# Patient Record
Sex: Female | Born: 1935
Health system: Southern US, Community
[De-identification: ages and names within clinical notes are randomized; demographics above are authoritative.]

## PROBLEM LIST (undated history)

## (undated) DIAGNOSIS — I5022 Chronic systolic (congestive) heart failure: Secondary | ICD-10-CM

## (undated) DIAGNOSIS — I4719 Other supraventricular tachycardia: Secondary | ICD-10-CM

## (undated) DIAGNOSIS — I471 Supraventricular tachycardia: Secondary | ICD-10-CM

## (undated) DIAGNOSIS — I248 Other forms of acute ischemic heart disease: Secondary | ICD-10-CM

## (undated) DIAGNOSIS — R040 Epistaxis: Secondary | ICD-10-CM

## (undated) DIAGNOSIS — I2489 Other forms of acute ischemic heart disease: Secondary | ICD-10-CM

## (undated) DIAGNOSIS — H3530 Unspecified macular degeneration: Secondary | ICD-10-CM

## (undated) DIAGNOSIS — I2699 Other pulmonary embolism without acute cor pulmonale: Secondary | ICD-10-CM

## (undated) DIAGNOSIS — H269 Unspecified cataract: Secondary | ICD-10-CM

## (undated) DIAGNOSIS — S42202A Unspecified fracture of upper end of left humerus, initial encounter for closed fracture: Secondary | ICD-10-CM

## (undated) DIAGNOSIS — M810 Age-related osteoporosis without current pathological fracture: Secondary | ICD-10-CM

## (undated) HISTORY — DX: Other supraventricular tachycardia: I47.19

## (undated) HISTORY — DX: Other forms of acute ischemic heart disease: I24.89

## (undated) HISTORY — DX: Supraventricular tachycardia: I47.1

## (undated) HISTORY — DX: Epistaxis: R04.0

## (undated) HISTORY — DX: Other pulmonary embolism without acute cor pulmonale: I26.99

## (undated) HISTORY — PX: TONSILLECTOMY: SUR1361

## (undated) HISTORY — PX: CHOLECYSTECTOMY: SHX55

## (undated) HISTORY — PX: CATARACT EXTRACTION: SUR2

## (undated) HISTORY — DX: Chronic systolic (congestive) heart failure: I50.22

## (undated) HISTORY — DX: Other forms of acute ischemic heart disease: I24.8

---
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
## 2001-06-05 ENCOUNTER — Encounter: Payer: Self-pay | Admitting: Family Medicine

## 2001-06-05 ENCOUNTER — Ambulatory Visit (HOSPITAL_COMMUNITY): Admission: RE | Admit: 2001-06-05 | Discharge: 2001-06-05 | Payer: Self-pay | Admitting: Family Medicine

## 2001-11-07 ENCOUNTER — Other Ambulatory Visit: Admission: RE | Admit: 2001-11-07 | Discharge: 2001-11-07 | Payer: Self-pay | Admitting: Dermatology

## 2002-06-10 ENCOUNTER — Ambulatory Visit (HOSPITAL_COMMUNITY): Admission: RE | Admit: 2002-06-10 | Discharge: 2002-06-10 | Payer: Self-pay | Admitting: Family Medicine

## 2002-06-10 ENCOUNTER — Encounter: Payer: Self-pay | Admitting: Family Medicine

## 2003-07-06 ENCOUNTER — Ambulatory Visit (HOSPITAL_COMMUNITY): Admission: RE | Admit: 2003-07-06 | Discharge: 2003-07-06 | Payer: Self-pay | Admitting: Family Medicine

## 2003-07-06 ENCOUNTER — Encounter: Payer: Self-pay | Admitting: Family Medicine

## 2004-02-23 ENCOUNTER — Ambulatory Visit (HOSPITAL_COMMUNITY): Admission: RE | Admit: 2004-02-23 | Discharge: 2004-02-23 | Payer: Self-pay | Admitting: Family Medicine

## 2004-07-19 ENCOUNTER — Ambulatory Visit (HOSPITAL_COMMUNITY): Admission: RE | Admit: 2004-07-19 | Discharge: 2004-07-19 | Payer: Self-pay | Admitting: Family Medicine

## 2005-07-31 ENCOUNTER — Ambulatory Visit (HOSPITAL_COMMUNITY): Admission: RE | Admit: 2005-07-31 | Discharge: 2005-07-31 | Payer: Self-pay | Admitting: Family Medicine

## 2006-08-21 ENCOUNTER — Ambulatory Visit (HOSPITAL_COMMUNITY): Admission: RE | Admit: 2006-08-21 | Discharge: 2006-08-21 | Payer: Self-pay | Admitting: Family Medicine

## 2007-08-27 ENCOUNTER — Ambulatory Visit (HOSPITAL_COMMUNITY): Admission: RE | Admit: 2007-08-27 | Discharge: 2007-08-27 | Payer: Self-pay | Admitting: Family Medicine

## 2008-06-25 ENCOUNTER — Ambulatory Visit (HOSPITAL_COMMUNITY): Admission: RE | Admit: 2008-06-25 | Discharge: 2008-06-25 | Payer: Self-pay | Admitting: Family Medicine

## 2008-09-01 ENCOUNTER — Ambulatory Visit (HOSPITAL_COMMUNITY): Admission: RE | Admit: 2008-09-01 | Discharge: 2008-09-01 | Payer: Self-pay | Admitting: Family Medicine

## 2009-09-15 ENCOUNTER — Ambulatory Visit (HOSPITAL_COMMUNITY): Admission: RE | Admit: 2009-09-15 | Discharge: 2009-09-15 | Payer: Self-pay | Admitting: Family Medicine

## 2010-08-03 ENCOUNTER — Ambulatory Visit (HOSPITAL_COMMUNITY): Admission: RE | Admit: 2010-08-03 | Discharge: 2010-08-03 | Payer: Self-pay | Admitting: Family Medicine

## 2010-09-19 ENCOUNTER — Ambulatory Visit (HOSPITAL_COMMUNITY): Admission: RE | Admit: 2010-09-19 | Discharge: 2010-09-19 | Payer: Self-pay | Admitting: Family Medicine

## 2011-09-25 ENCOUNTER — Other Ambulatory Visit (HOSPITAL_COMMUNITY): Payer: Self-pay | Admitting: Family Medicine

## 2011-09-25 DIAGNOSIS — Z139 Encounter for screening, unspecified: Secondary | ICD-10-CM

## 2011-10-03 ENCOUNTER — Ambulatory Visit (HOSPITAL_COMMUNITY)
Admission: RE | Admit: 2011-10-03 | Discharge: 2011-10-03 | Disposition: A | Discharge status: Home / Self Care | Payer: Medicare Other | Source: Ambulatory Visit | Attending: Family Medicine | Admitting: Family Medicine

## 2011-10-03 DIAGNOSIS — Z1231 Encounter for screening mammogram for malignant neoplasm of breast: Secondary | ICD-10-CM | POA: Insufficient documentation

## 2011-10-03 DIAGNOSIS — Z139 Encounter for screening, unspecified: Secondary | ICD-10-CM

## 2012-04-22 ENCOUNTER — Other Ambulatory Visit (HOSPITAL_COMMUNITY): Payer: Self-pay | Admitting: Family Medicine

## 2012-04-22 ENCOUNTER — Ambulatory Visit (HOSPITAL_COMMUNITY)
Admission: RE | Admit: 2012-04-22 | Discharge: 2012-04-22 | Disposition: A | Discharge status: Home / Self Care | Payer: Medicare Other | Source: Ambulatory Visit | Attending: Family Medicine | Admitting: Family Medicine

## 2012-04-22 DIAGNOSIS — M25569 Pain in unspecified knee: Secondary | ICD-10-CM | POA: Diagnosis not present

## 2012-04-22 DIAGNOSIS — M159 Polyosteoarthritis, unspecified: Secondary | ICD-10-CM | POA: Diagnosis not present

## 2012-04-22 DIAGNOSIS — Z6825 Body mass index (BMI) 25.0-25.9, adult: Secondary | ICD-10-CM | POA: Diagnosis not present

## 2012-04-22 DIAGNOSIS — Z139 Encounter for screening, unspecified: Secondary | ICD-10-CM

## 2012-04-25 DIAGNOSIS — M25569 Pain in unspecified knee: Secondary | ICD-10-CM | POA: Diagnosis not present

## 2012-04-25 DIAGNOSIS — M109 Gout, unspecified: Secondary | ICD-10-CM | POA: Diagnosis not present

## 2012-05-01 ENCOUNTER — Other Ambulatory Visit (HOSPITAL_COMMUNITY): Payer: Medicare Other

## 2012-05-16 ENCOUNTER — Other Ambulatory Visit (HOSPITAL_COMMUNITY): Payer: Medicare Other

## 2012-05-16 ENCOUNTER — Ambulatory Visit (HOSPITAL_COMMUNITY)
Admission: RE | Admit: 2012-05-16 | Discharge: 2012-05-16 | Disposition: A | Discharge status: Home / Self Care | Payer: Medicare Other | Source: Ambulatory Visit | Attending: Family Medicine | Admitting: Family Medicine

## 2012-05-16 DIAGNOSIS — Z13820 Encounter for screening for osteoporosis: Secondary | ICD-10-CM | POA: Diagnosis not present

## 2012-05-16 DIAGNOSIS — M818 Other osteoporosis without current pathological fracture: Secondary | ICD-10-CM | POA: Insufficient documentation

## 2012-05-16 DIAGNOSIS — M810 Age-related osteoporosis without current pathological fracture: Secondary | ICD-10-CM | POA: Diagnosis not present

## 2012-05-16 DIAGNOSIS — Z139 Encounter for screening, unspecified: Secondary | ICD-10-CM

## 2012-06-03 DIAGNOSIS — E559 Vitamin D deficiency, unspecified: Secondary | ICD-10-CM | POA: Diagnosis not present

## 2012-06-03 DIAGNOSIS — M810 Age-related osteoporosis without current pathological fracture: Secondary | ICD-10-CM | POA: Diagnosis not present

## 2012-06-03 DIAGNOSIS — E785 Hyperlipidemia, unspecified: Secondary | ICD-10-CM | POA: Diagnosis not present

## 2012-06-04 DIAGNOSIS — E785 Hyperlipidemia, unspecified: Secondary | ICD-10-CM | POA: Diagnosis not present

## 2012-06-04 DIAGNOSIS — E559 Vitamin D deficiency, unspecified: Secondary | ICD-10-CM | POA: Diagnosis not present

## 2012-06-04 DIAGNOSIS — M810 Age-related osteoporosis without current pathological fracture: Secondary | ICD-10-CM | POA: Diagnosis not present

## 2012-08-27 DIAGNOSIS — H04129 Dry eye syndrome of unspecified lacrimal gland: Secondary | ICD-10-CM | POA: Diagnosis not present

## 2012-08-28 ENCOUNTER — Other Ambulatory Visit (HOSPITAL_COMMUNITY): Payer: Self-pay | Admitting: Family Medicine

## 2012-08-28 DIAGNOSIS — Z139 Encounter for screening, unspecified: Secondary | ICD-10-CM

## 2012-10-03 ENCOUNTER — Ambulatory Visit (HOSPITAL_COMMUNITY): Payer: 59

## 2012-10-07 ENCOUNTER — Ambulatory Visit (HOSPITAL_COMMUNITY): Payer: 59

## 2012-10-08 ENCOUNTER — Ambulatory Visit (HOSPITAL_COMMUNITY)
Admission: RE | Admit: 2012-10-08 | Discharge: 2012-10-08 | Disposition: A | Discharge status: Home / Self Care | Payer: Medicare Other | Source: Ambulatory Visit | Attending: Family Medicine | Admitting: Family Medicine

## 2012-10-08 DIAGNOSIS — Z139 Encounter for screening, unspecified: Secondary | ICD-10-CM

## 2012-10-08 DIAGNOSIS — Z1231 Encounter for screening mammogram for malignant neoplasm of breast: Secondary | ICD-10-CM | POA: Insufficient documentation

## 2012-11-26 DIAGNOSIS — H353190 Nonexudative age-related macular degeneration, unspecified eye, stage unspecified: Secondary | ICD-10-CM | POA: Diagnosis not present

## 2012-11-26 DIAGNOSIS — Z961 Presence of intraocular lens: Secondary | ICD-10-CM | POA: Diagnosis not present

## 2013-03-12 DIAGNOSIS — H6120 Impacted cerumen, unspecified ear: Secondary | ICD-10-CM | POA: Diagnosis not present

## 2013-03-17 ENCOUNTER — Other Ambulatory Visit (HOSPITAL_COMMUNITY): Payer: Self-pay | Admitting: Family Medicine

## 2013-03-17 DIAGNOSIS — R9389 Abnormal findings on diagnostic imaging of other specified body structures: Secondary | ICD-10-CM

## 2013-03-20 ENCOUNTER — Ambulatory Visit (HOSPITAL_COMMUNITY)
Admission: RE | Admit: 2013-03-20 | Discharge: 2013-03-20 | Disposition: A | Discharge status: Home / Self Care | Payer: Medicare Other | Source: Ambulatory Visit | Attending: Family Medicine | Admitting: Family Medicine

## 2013-03-20 DIAGNOSIS — R9389 Abnormal findings on diagnostic imaging of other specified body structures: Secondary | ICD-10-CM | POA: Diagnosis not present

## 2013-03-20 DIAGNOSIS — I6529 Occlusion and stenosis of unspecified carotid artery: Secondary | ICD-10-CM | POA: Diagnosis not present

## 2013-03-20 DIAGNOSIS — Z136 Encounter for screening for cardiovascular disorders: Secondary | ICD-10-CM | POA: Diagnosis not present

## 2013-07-01 ENCOUNTER — Emergency Department (HOSPITAL_COMMUNITY)
Admission: EM | Admit: 2013-07-01 | Discharge: 2013-07-01 | Disposition: A | Discharge status: Home / Self Care | Payer: Medicare Other | Attending: Emergency Medicine | Admitting: Emergency Medicine

## 2013-07-01 ENCOUNTER — Encounter (HOSPITAL_COMMUNITY): Payer: Self-pay | Admitting: *Deleted

## 2013-07-01 DIAGNOSIS — Z87891 Personal history of nicotine dependence: Secondary | ICD-10-CM | POA: Diagnosis not present

## 2013-07-01 DIAGNOSIS — Z8739 Personal history of other diseases of the musculoskeletal system and connective tissue: Secondary | ICD-10-CM | POA: Diagnosis not present

## 2013-07-01 DIAGNOSIS — Z8669 Personal history of other diseases of the nervous system and sense organs: Secondary | ICD-10-CM | POA: Insufficient documentation

## 2013-07-01 DIAGNOSIS — R040 Epistaxis: Secondary | ICD-10-CM

## 2013-07-01 DIAGNOSIS — H6120 Impacted cerumen, unspecified ear: Secondary | ICD-10-CM | POA: Diagnosis not present

## 2013-07-01 DIAGNOSIS — H6121 Impacted cerumen, right ear: Secondary | ICD-10-CM

## 2013-07-01 HISTORY — DX: Age-related osteoporosis without current pathological fracture: M81.0

## 2013-07-01 HISTORY — DX: Unspecified cataract: H26.9

## 2013-07-01 MED ORDER — OXYMETAZOLINE HCL 0.05 % NA SOLN
1.0000 | Freq: Once | NASAL | Status: AC
  Administered 2013-07-01: 1 via NASAL
  Filled 2013-07-01: qty 15

## 2013-07-01 NOTE — ED Notes (Signed)
Patient's ears irrigated with gross amounts of wax removed. Patient's ear canals clean and eardrums visible. Patient verbalizes that she can now hear out of her right ear.

## 2013-07-01 NOTE — ED Notes (Signed)
Nosebleed began this a.m. At 0500.  Has been taking daily ASA x 1 year (fairly regularly) and has experienced nosebleeds in right nare almost every other day for 2 weeks.  Usually last 5 minutes and stop w/pressure applied.  Could not get it to stop today.

## 2013-07-01 NOTE — ED Provider Notes (Signed)
History    CSN: [id number] Arrival date & time 07/01/13  4098  First MD Initiated Contact with Patient 07/01/13 [phone]     Chief Complaint  Patient presents with  . Epistaxis   (Consider location/radiation/quality/duration/timing/severity/associated sxs/prior Treatment) Patient is a 79 y.o. female presenting with nosebleeds. The history is provided by the patient and a relative.  Epistaxis Location:  R nare Severity:  Moderate Duration:  3 days Timing:  Intermittent Progression:  Worsening (She woke this morning around 5 to go to the bathroom,  and redeveloped a nose bleed which has not resolved as they usually do by applying presure.) Chronicity:  Recurrent Context: aspirin use   Context: not bleeding disorder, not foreign body, not home oxygen, not recent infection and not trauma   Context comment:  She takes a daily asa 81 mg. Relieved by:  Nothing Worsened by:  Nothing tried Associated symptoms: blood in oropharynx   Associated symptoms: no congestion, no cough, no dizziness, no fever, no headaches, no sneezing and no sore throat   Risk factors: no recent nasal surgery and no sinus problems   Risk factors comment:  She reports a history of nosebleeds,  occurring every few years for several days,  then resolves.  Past Medical History  Diagnosis Date  . Osteoporosis   . Cataracts, bilateral    Past Surgical History  Procedure Laterality Date  . Cataract extraction      bilaterally   History reviewed. No pertinent family history. History  Substance Use Topics  . Smoking status: Former Smoker    Types: Cigarettes  . Smokeless tobacco: Not on file  . Alcohol Use: No   OB History   Grav Para Term Preterm Abortions TAB SAB Ect Mult Living   0              Review of Systems  Constitutional: Negative for fever.  HENT: Positive for nosebleeds. Negative for congestion, sore throat, rhinorrhea, sneezing, neck pain and sinus pressure.   Eyes: Negative.   Respiratory:  Negative for cough, chest tightness and shortness of breath.   Cardiovascular: Negative for chest pain.  Gastrointestinal: Negative for nausea and abdominal pain.  Genitourinary: Negative.   Musculoskeletal: Negative for joint swelling and arthralgias.  Skin: Negative.  Negative for rash and wound.  Neurological: Negative for dizziness, weakness, light-headedness, numbness and headaches.  Psychiatric/Behavioral: Negative.     Allergies  Review of patient's allergies indicates no known allergies.  Home Medications  No current outpatient prescriptions on file. BP 121/85  Pulse 78  Temp(Src) 97.7 F (36.5 C) (Oral)  Resp 16  Ht 5\' 4"  (1.626 m)  Wt 140 lb (63.504 kg)  BMI 24.02 kg/m2  SpO2 94% Physical Exam  Nursing note and vitals reviewed. Constitutional: She is oriented to person, place, and time. She appears well-developed and well-nourished.  HENT:  Head: Normocephalic and atraumatic.  Nose: Epistaxis is observed.  Right cerumen impaction.  Unable to visualize source of bleeding in right nare.   Eyes: Conjunctivae are normal.  Neck: Normal range of motion.  Cardiovascular: Normal rate and regular rhythm.   Pulmonary/Chest: Effort normal.  Musculoskeletal: Normal range of motion.  Neurological: She is alert and oriented to person, place, and time.  Skin: Skin is warm and dry.  Psychiatric: She has a normal mood and affect.    ED Course  Procedures (including critical care time)   Pt was given 1 spray of Afrin,  Held pressure for 10 minutes with continued small  amount of blood present,  Still unable to visualize source.  Reapplied 2 sprays of afrin,  Held pressure again for 10 minutes with apparent resolution of bleeding.  No blood visualized in nostril,  No posterior pharygeal bleeding.  Will continue to monitor for signs of re-bleeding.  During visit,  Pt expressed concerns re right decreased hearing acuity and ear pressure since "this spring".  Has been applying a otc  cerumen softener product this week.  Requested evaluation for this.  Cerumen impaction present,  Will flush this ear as we continue to monitor for signs of residual epistaxis.   9:51 AM   Recheck of nostril,  No bleeding,  No posterior pharyngeal blood.  RN flushed right ear with complete removal of cerumen impaction.  Smaller amount of wax on left with no impaction,  This was flushed as well. Labs Reviewed - No data to display No results found. 1. Epistaxis, recurrent   2. Cerumen impaction, right     MDM  Discussed nasal packing with patient who is reluctant to have this placed.  Epistaxis appears resolved at present.  Advised to avoid blowing nose for the next 2 days.  She will take the afrin home, instructed in use only if bleed returns, followed by pressure.  Advised that if bleeding recurs and she needs to return,  Will probably need packing. She understands.    Burgess Amor, PA-C 07/01/13 [phone]

## 2013-07-04 NOTE — ED Provider Notes (Signed)
Medical screening examination/treatment/procedure(s) were performed by non-physician practitioner and as supervising physician I was immediately available for consultation/collaboration.   Laray Anger, DO 07/04/13 1358

## 2013-09-29 ENCOUNTER — Other Ambulatory Visit (HOSPITAL_COMMUNITY): Payer: Self-pay | Admitting: Physician Assistant

## 2013-09-29 DIAGNOSIS — Z139 Encounter for screening, unspecified: Secondary | ICD-10-CM

## 2013-09-30 DIAGNOSIS — Z23 Encounter for immunization: Secondary | ICD-10-CM | POA: Diagnosis not present

## 2013-10-06 DIAGNOSIS — M79609 Pain in unspecified limb: Secondary | ICD-10-CM | POA: Diagnosis not present

## 2013-10-06 DIAGNOSIS — M722 Plantar fascial fibromatosis: Secondary | ICD-10-CM | POA: Diagnosis not present

## 2013-10-14 ENCOUNTER — Ambulatory Visit (HOSPITAL_COMMUNITY)
Admission: RE | Admit: 2013-10-14 | Discharge: 2013-10-14 | Disposition: A | Discharge status: Home / Self Care | Payer: Medicare Other | Source: Ambulatory Visit | Attending: Physician Assistant | Admitting: Physician Assistant

## 2013-10-14 DIAGNOSIS — Z1231 Encounter for screening mammogram for malignant neoplasm of breast: Secondary | ICD-10-CM | POA: Insufficient documentation

## 2013-10-14 DIAGNOSIS — Z139 Encounter for screening, unspecified: Secondary | ICD-10-CM

## 2013-10-27 DIAGNOSIS — M79609 Pain in unspecified limb: Secondary | ICD-10-CM | POA: Diagnosis not present

## 2013-10-27 DIAGNOSIS — M722 Plantar fascial fibromatosis: Secondary | ICD-10-CM | POA: Diagnosis not present

## 2013-11-17 DIAGNOSIS — M722 Plantar fascial fibromatosis: Secondary | ICD-10-CM | POA: Diagnosis not present

## 2013-11-17 DIAGNOSIS — M79609 Pain in unspecified limb: Secondary | ICD-10-CM | POA: Diagnosis not present

## 2013-12-08 DIAGNOSIS — M79609 Pain in unspecified limb: Secondary | ICD-10-CM | POA: Diagnosis not present

## 2013-12-08 DIAGNOSIS — M722 Plantar fascial fibromatosis: Secondary | ICD-10-CM | POA: Diagnosis not present

## 2013-12-30 DIAGNOSIS — H353190 Nonexudative age-related macular degeneration, unspecified eye, stage unspecified: Secondary | ICD-10-CM | POA: Diagnosis not present

## 2013-12-30 DIAGNOSIS — Z961 Presence of intraocular lens: Secondary | ICD-10-CM | POA: Diagnosis not present

## 2014-03-20 DIAGNOSIS — Z6825 Body mass index (BMI) 25.0-25.9, adult: Secondary | ICD-10-CM | POA: Diagnosis not present

## 2014-03-20 DIAGNOSIS — E785 Hyperlipidemia, unspecified: Secondary | ICD-10-CM | POA: Diagnosis not present

## 2014-03-20 DIAGNOSIS — J019 Acute sinusitis, unspecified: Secondary | ICD-10-CM | POA: Diagnosis not present

## 2014-03-20 DIAGNOSIS — Z0000 Encounter for general adult medical examination without abnormal findings: Secondary | ICD-10-CM | POA: Diagnosis not present

## 2014-03-20 DIAGNOSIS — M810 Age-related osteoporosis without current pathological fracture: Secondary | ICD-10-CM | POA: Diagnosis not present

## 2014-03-20 DIAGNOSIS — J310 Chronic rhinitis: Secondary | ICD-10-CM | POA: Diagnosis not present

## 2014-06-30 DIAGNOSIS — N183 Chronic kidney disease, stage 3 unspecified: Secondary | ICD-10-CM | POA: Diagnosis not present

## 2014-06-30 DIAGNOSIS — E568 Deficiency of other vitamins: Secondary | ICD-10-CM | POA: Diagnosis not present

## 2014-06-30 DIAGNOSIS — Z6825 Body mass index (BMI) 25.0-25.9, adult: Secondary | ICD-10-CM | POA: Diagnosis not present

## 2014-07-01 DIAGNOSIS — E559 Vitamin D deficiency, unspecified: Secondary | ICD-10-CM | POA: Diagnosis not present

## 2014-07-14 DIAGNOSIS — Z6825 Body mass index (BMI) 25.0-25.9, adult: Secondary | ICD-10-CM | POA: Diagnosis not present

## 2014-07-14 DIAGNOSIS — M109 Gout, unspecified: Secondary | ICD-10-CM | POA: Diagnosis not present

## 2014-08-19 ENCOUNTER — Other Ambulatory Visit (HOSPITAL_COMMUNITY): Payer: Self-pay | Admitting: Internal Medicine

## 2014-08-19 DIAGNOSIS — R1012 Left upper quadrant pain: Secondary | ICD-10-CM | POA: Diagnosis not present

## 2014-08-19 DIAGNOSIS — R109 Unspecified abdominal pain: Secondary | ICD-10-CM

## 2014-08-19 DIAGNOSIS — Z6825 Body mass index (BMI) 25.0-25.9, adult: Secondary | ICD-10-CM | POA: Diagnosis not present

## 2014-08-24 ENCOUNTER — Ambulatory Visit (HOSPITAL_COMMUNITY): Payer: Medicare Other

## 2014-08-24 DIAGNOSIS — B029 Zoster without complications: Secondary | ICD-10-CM | POA: Diagnosis not present

## 2014-08-24 DIAGNOSIS — Z6825 Body mass index (BMI) 25.0-25.9, adult: Secondary | ICD-10-CM | POA: Diagnosis not present

## 2014-10-19 DIAGNOSIS — Z23 Encounter for immunization: Secondary | ICD-10-CM | POA: Diagnosis not present

## 2014-10-26 ENCOUNTER — Other Ambulatory Visit (HOSPITAL_COMMUNITY): Payer: Self-pay | Admitting: Family Medicine

## 2014-10-26 DIAGNOSIS — Z1231 Encounter for screening mammogram for malignant neoplasm of breast: Secondary | ICD-10-CM

## 2014-11-05 ENCOUNTER — Ambulatory Visit (HOSPITAL_COMMUNITY)
Admission: RE | Admit: 2014-11-05 | Discharge: 2014-11-05 | Disposition: A | Discharge status: Home / Self Care | Payer: Medicare Other | Source: Ambulatory Visit | Attending: Family Medicine | Admitting: Family Medicine

## 2014-11-05 DIAGNOSIS — Z1231 Encounter for screening mammogram for malignant neoplasm of breast: Secondary | ICD-10-CM | POA: Insufficient documentation

## 2015-01-26 DIAGNOSIS — Z961 Presence of intraocular lens: Secondary | ICD-10-CM | POA: Diagnosis not present

## 2015-01-26 DIAGNOSIS — H04121 Dry eye syndrome of right lacrimal gland: Secondary | ICD-10-CM | POA: Diagnosis not present

## 2015-01-26 DIAGNOSIS — H3531 Nonexudative age-related macular degeneration: Secondary | ICD-10-CM | POA: Diagnosis not present

## 2015-07-19 ENCOUNTER — Other Ambulatory Visit (HOSPITAL_COMMUNITY): Payer: Self-pay | Admitting: Family Medicine

## 2015-07-19 DIAGNOSIS — M810 Age-related osteoporosis without current pathological fracture: Secondary | ICD-10-CM | POA: Diagnosis not present

## 2015-07-19 DIAGNOSIS — E559 Vitamin D deficiency, unspecified: Secondary | ICD-10-CM | POA: Diagnosis not present

## 2015-07-19 DIAGNOSIS — Z6823 Body mass index (BMI) 23.0-23.9, adult: Secondary | ICD-10-CM | POA: Diagnosis not present

## 2015-07-19 DIAGNOSIS — Z1389 Encounter for screening for other disorder: Secondary | ICD-10-CM | POA: Diagnosis not present

## 2015-07-26 ENCOUNTER — Other Ambulatory Visit (HOSPITAL_COMMUNITY): Payer: Medicare Other

## 2015-10-11 ENCOUNTER — Other Ambulatory Visit (HOSPITAL_COMMUNITY): Payer: Self-pay | Admitting: Family Medicine

## 2015-10-11 DIAGNOSIS — Z1231 Encounter for screening mammogram for malignant neoplasm of breast: Secondary | ICD-10-CM

## 2015-11-10 ENCOUNTER — Ambulatory Visit (HOSPITAL_COMMUNITY)
Admission: RE | Admit: 2015-11-10 | Discharge: 2015-11-10 | Disposition: A | Discharge status: Home / Self Care | Payer: Medicare Other | Source: Ambulatory Visit | Attending: Family Medicine | Admitting: Family Medicine

## 2015-11-10 DIAGNOSIS — Z1231 Encounter for screening mammogram for malignant neoplasm of breast: Secondary | ICD-10-CM

## 2015-11-10 DIAGNOSIS — Z23 Encounter for immunization: Secondary | ICD-10-CM | POA: Diagnosis not present

## 2015-11-30 DIAGNOSIS — H3562 Retinal hemorrhage, left eye: Secondary | ICD-10-CM | POA: Diagnosis not present

## 2015-11-30 DIAGNOSIS — H353221 Exudative age-related macular degeneration, left eye, with active choroidal neovascularization: Secondary | ICD-10-CM | POA: Diagnosis not present

## 2015-11-30 DIAGNOSIS — H353132 Nonexudative age-related macular degeneration, bilateral, intermediate dry stage: Secondary | ICD-10-CM | POA: Diagnosis not present

## 2015-11-30 DIAGNOSIS — H353122 Nonexudative age-related macular degeneration, left eye, intermediate dry stage: Secondary | ICD-10-CM | POA: Diagnosis not present

## 2015-12-01 DIAGNOSIS — H353132 Nonexudative age-related macular degeneration, bilateral, intermediate dry stage: Secondary | ICD-10-CM | POA: Diagnosis not present

## 2015-12-01 DIAGNOSIS — H353221 Exudative age-related macular degeneration, left eye, with active choroidal neovascularization: Secondary | ICD-10-CM | POA: Diagnosis not present

## 2016-01-05 DIAGNOSIS — H353132 Nonexudative age-related macular degeneration, bilateral, intermediate dry stage: Secondary | ICD-10-CM | POA: Diagnosis not present

## 2016-01-05 DIAGNOSIS — H353221 Exudative age-related macular degeneration, left eye, with active choroidal neovascularization: Secondary | ICD-10-CM | POA: Diagnosis not present

## 2016-02-16 DIAGNOSIS — H353211 Exudative age-related macular degeneration, right eye, with active choroidal neovascularization: Secondary | ICD-10-CM | POA: Diagnosis not present

## 2016-02-16 DIAGNOSIS — H353221 Exudative age-related macular degeneration, left eye, with active choroidal neovascularization: Secondary | ICD-10-CM | POA: Diagnosis not present

## 2016-04-05 DIAGNOSIS — H353222 Exudative age-related macular degeneration, left eye, with inactive choroidal neovascularization: Secondary | ICD-10-CM | POA: Diagnosis not present

## 2016-04-05 DIAGNOSIS — H353133 Nonexudative age-related macular degeneration, bilateral, advanced atrophic without subfoveal involvement: Secondary | ICD-10-CM | POA: Diagnosis not present

## 2016-04-05 DIAGNOSIS — H35361 Drusen (degenerative) of macula, right eye: Secondary | ICD-10-CM | POA: Diagnosis not present

## 2016-04-05 DIAGNOSIS — H3562 Retinal hemorrhage, left eye: Secondary | ICD-10-CM | POA: Diagnosis not present

## 2016-05-16 DIAGNOSIS — Z961 Presence of intraocular lens: Secondary | ICD-10-CM | POA: Diagnosis not present

## 2016-05-16 DIAGNOSIS — H353122 Nonexudative age-related macular degeneration, left eye, intermediate dry stage: Secondary | ICD-10-CM | POA: Diagnosis not present

## 2016-05-16 DIAGNOSIS — H524 Presbyopia: Secondary | ICD-10-CM | POA: Diagnosis not present

## 2016-05-16 DIAGNOSIS — H52203 Unspecified astigmatism, bilateral: Secondary | ICD-10-CM | POA: Diagnosis not present

## 2016-05-16 DIAGNOSIS — H353111 Nonexudative age-related macular degeneration, right eye, early dry stage: Secondary | ICD-10-CM | POA: Diagnosis not present

## 2016-05-25 DIAGNOSIS — H6120 Impacted cerumen, unspecified ear: Secondary | ICD-10-CM | POA: Diagnosis not present

## 2016-05-25 DIAGNOSIS — E559 Vitamin D deficiency, unspecified: Secondary | ICD-10-CM | POA: Diagnosis not present

## 2016-05-25 DIAGNOSIS — R0989 Other specified symptoms and signs involving the circulatory and respiratory systems: Secondary | ICD-10-CM | POA: Diagnosis not present

## 2016-05-25 DIAGNOSIS — Z0001 Encounter for general adult medical examination with abnormal findings: Secondary | ICD-10-CM | POA: Diagnosis not present

## 2016-05-29 DIAGNOSIS — E785 Hyperlipidemia, unspecified: Secondary | ICD-10-CM | POA: Diagnosis not present

## 2016-05-29 DIAGNOSIS — E559 Vitamin D deficiency, unspecified: Secondary | ICD-10-CM | POA: Diagnosis not present

## 2016-05-29 DIAGNOSIS — R0989 Other specified symptoms and signs involving the circulatory and respiratory systems: Secondary | ICD-10-CM | POA: Diagnosis not present

## 2016-05-29 DIAGNOSIS — Z0001 Encounter for general adult medical examination with abnormal findings: Secondary | ICD-10-CM | POA: Diagnosis not present

## 2016-05-29 DIAGNOSIS — Z1389 Encounter for screening for other disorder: Secondary | ICD-10-CM | POA: Diagnosis not present

## 2016-05-31 DIAGNOSIS — R0989 Other specified symptoms and signs involving the circulatory and respiratory systems: Secondary | ICD-10-CM | POA: Diagnosis not present

## 2016-06-21 DIAGNOSIS — H3562 Retinal hemorrhage, left eye: Secondary | ICD-10-CM | POA: Diagnosis not present

## 2016-06-21 DIAGNOSIS — H353133 Nonexudative age-related macular degeneration, bilateral, advanced atrophic without subfoveal involvement: Secondary | ICD-10-CM | POA: Diagnosis not present

## 2016-06-21 DIAGNOSIS — H353222 Exudative age-related macular degeneration, left eye, with inactive choroidal neovascularization: Secondary | ICD-10-CM | POA: Diagnosis not present

## 2016-06-21 DIAGNOSIS — H35361 Drusen (degenerative) of macula, right eye: Secondary | ICD-10-CM | POA: Diagnosis not present

## 2016-06-24 DIAGNOSIS — I2699 Other pulmonary embolism without acute cor pulmonale: Secondary | ICD-10-CM

## 2016-06-24 HISTORY — DX: Other pulmonary embolism without acute cor pulmonale: I26.99

## 2016-07-03 ENCOUNTER — Emergency Department (HOSPITAL_COMMUNITY): Payer: Medicare Other

## 2016-07-03 ENCOUNTER — Encounter (HOSPITAL_COMMUNITY): Payer: Self-pay | Admitting: Emergency Medicine

## 2016-07-03 ENCOUNTER — Inpatient Hospital Stay (HOSPITAL_COMMUNITY)
Admission: EM | Admit: 2016-07-03 | Discharge: 2016-07-15 | DRG: 175 | Disposition: A | Discharge status: Skilled Nursing Facility | Payer: Medicare Other | Attending: Internal Medicine | Admitting: Internal Medicine

## 2016-07-03 DIAGNOSIS — R7989 Other specified abnormal findings of blood chemistry: Secondary | ICD-10-CM

## 2016-07-03 DIAGNOSIS — I517 Cardiomegaly: Secondary | ICD-10-CM

## 2016-07-03 DIAGNOSIS — R000 Tachycardia, unspecified: Secondary | ICD-10-CM

## 2016-07-03 DIAGNOSIS — R0602 Shortness of breath: Secondary | ICD-10-CM | POA: Diagnosis not present

## 2016-07-03 DIAGNOSIS — I2510 Atherosclerotic heart disease of native coronary artery without angina pectoris: Secondary | ICD-10-CM | POA: Diagnosis not present

## 2016-07-03 DIAGNOSIS — Z87891 Personal history of nicotine dependence: Secondary | ICD-10-CM

## 2016-07-03 DIAGNOSIS — I5082 Biventricular heart failure: Secondary | ICD-10-CM | POA: Insufficient documentation

## 2016-07-03 DIAGNOSIS — Z515 Encounter for palliative care: Secondary | ICD-10-CM | POA: Diagnosis not present

## 2016-07-03 DIAGNOSIS — Z6825 Body mass index (BMI) 25.0-25.9, adult: Secondary | ICD-10-CM | POA: Diagnosis not present

## 2016-07-03 DIAGNOSIS — I509 Heart failure, unspecified: Secondary | ICD-10-CM | POA: Diagnosis not present

## 2016-07-03 DIAGNOSIS — I248 Other forms of acute ischemic heart disease: Secondary | ICD-10-CM | POA: Diagnosis not present

## 2016-07-03 DIAGNOSIS — R262 Difficulty in walking, not elsewhere classified: Secondary | ICD-10-CM | POA: Diagnosis not present

## 2016-07-03 DIAGNOSIS — E663 Overweight: Secondary | ICD-10-CM | POA: Diagnosis not present

## 2016-07-03 DIAGNOSIS — R778 Other specified abnormalities of plasma proteins: Secondary | ICD-10-CM | POA: Insufficient documentation

## 2016-07-03 DIAGNOSIS — I4719 Other supraventricular tachycardia: Secondary | ICD-10-CM | POA: Insufficient documentation

## 2016-07-03 DIAGNOSIS — I471 Supraventricular tachycardia: Secondary | ICD-10-CM | POA: Diagnosis not present

## 2016-07-03 DIAGNOSIS — K5900 Constipation, unspecified: Secondary | ICD-10-CM | POA: Diagnosis not present

## 2016-07-03 DIAGNOSIS — J90 Pleural effusion, not elsewhere classified: Secondary | ICD-10-CM | POA: Diagnosis not present

## 2016-07-03 DIAGNOSIS — J9601 Acute respiratory failure with hypoxia: Secondary | ICD-10-CM | POA: Diagnosis present

## 2016-07-03 DIAGNOSIS — I2609 Other pulmonary embolism with acute cor pulmonale: Secondary | ICD-10-CM | POA: Diagnosis not present

## 2016-07-03 DIAGNOSIS — Z66 Do not resuscitate: Secondary | ICD-10-CM | POA: Diagnosis present

## 2016-07-03 DIAGNOSIS — H3530 Unspecified macular degeneration: Secondary | ICD-10-CM | POA: Diagnosis present

## 2016-07-03 DIAGNOSIS — J449 Chronic obstructive pulmonary disease, unspecified: Secondary | ICD-10-CM | POA: Diagnosis not present

## 2016-07-03 DIAGNOSIS — M810 Age-related osteoporosis without current pathological fracture: Secondary | ICD-10-CM | POA: Diagnosis present

## 2016-07-03 DIAGNOSIS — I5021 Acute systolic (congestive) heart failure: Secondary | ICD-10-CM

## 2016-07-03 DIAGNOSIS — Z7189 Other specified counseling: Secondary | ICD-10-CM | POA: Insufficient documentation

## 2016-07-03 DIAGNOSIS — I2489 Other forms of acute ischemic heart disease: Secondary | ICD-10-CM | POA: Insufficient documentation

## 2016-07-03 DIAGNOSIS — J9691 Respiratory failure, unspecified with hypoxia: Secondary | ICD-10-CM | POA: Diagnosis not present

## 2016-07-03 DIAGNOSIS — J441 Chronic obstructive pulmonary disease with (acute) exacerbation: Secondary | ICD-10-CM | POA: Insufficient documentation

## 2016-07-03 DIAGNOSIS — I429 Cardiomyopathy, unspecified: Secondary | ICD-10-CM | POA: Insufficient documentation

## 2016-07-03 DIAGNOSIS — R040 Epistaxis: Secondary | ICD-10-CM | POA: Diagnosis not present

## 2016-07-03 DIAGNOSIS — J9690 Respiratory failure, unspecified, unspecified whether with hypoxia or hypercapnia: Secondary | ICD-10-CM

## 2016-07-03 DIAGNOSIS — M6281 Muscle weakness (generalized): Secondary | ICD-10-CM | POA: Diagnosis not present

## 2016-07-03 DIAGNOSIS — I2692 Saddle embolus of pulmonary artery without acute cor pulmonale: Secondary | ICD-10-CM | POA: Diagnosis not present

## 2016-07-03 DIAGNOSIS — R279 Unspecified lack of coordination: Secondary | ICD-10-CM | POA: Diagnosis not present

## 2016-07-03 DIAGNOSIS — I5022 Chronic systolic (congestive) heart failure: Secondary | ICD-10-CM | POA: Diagnosis not present

## 2016-07-03 DIAGNOSIS — Z79899 Other long term (current) drug therapy: Secondary | ICD-10-CM | POA: Diagnosis not present

## 2016-07-03 DIAGNOSIS — I2699 Other pulmonary embolism without acute cor pulmonale: Principal | ICD-10-CM

## 2016-07-03 HISTORY — DX: Unspecified macular degeneration: H35.30

## 2016-07-03 LAB — URINALYSIS, ROUTINE W REFLEX MICROSCOPIC
BILIRUBIN URINE: NEGATIVE
Glucose, UA: NEGATIVE mg/dL
Hgb urine dipstick: NEGATIVE
KETONES UR: NEGATIVE mg/dL
LEUKOCYTES UA: NEGATIVE
NITRITE: NEGATIVE
PH: 5.5 (ref 5.0–8.0)
Protein, ur: NEGATIVE mg/dL
SPECIFIC GRAVITY, URINE: 1.01 (ref 1.005–1.030)

## 2016-07-03 LAB — CBC
HEMATOCRIT: 44 % (ref 36.0–46.0)
Hemoglobin: 14.3 g/dL (ref 12.0–15.0)
MCH: 30.6 pg (ref 26.0–34.0)
MCHC: 32.5 g/dL (ref 30.0–36.0)
MCV: 94 fL (ref 78.0–100.0)
PLATELETS: 221 10*3/uL (ref 150–400)
RBC: 4.68 MIL/uL (ref 3.87–5.11)
RDW: 14.3 % (ref 11.5–15.5)
WBC: 8 10*3/uL (ref 4.0–10.5)

## 2016-07-03 LAB — BASIC METABOLIC PANEL
Anion gap: 8 (ref 5–15)
BUN: 21 mg/dL — AB (ref 6–20)
CHLORIDE: 101 mmol/L (ref 101–111)
CO2: 26 mmol/L (ref 22–32)
CREATININE: 0.78 mg/dL (ref 0.44–1.00)
Calcium: 8.9 mg/dL (ref 8.9–10.3)
GFR calc Af Amer: >60 mL/min (ref >60)
GFR calc non Af Amer: >60 mL/min (ref >60)
GLUCOSE: 93 mg/dL (ref 65–99)
POTASSIUM: 4.6 mmol/L (ref 3.5–5.1)
Sodium: 135 mmol/L (ref 135–145)

## 2016-07-03 LAB — TSH: TSH: 2.004 u[IU]/mL (ref 0.350–4.500)

## 2016-07-03 LAB — PROTIME-INR
INR: 0.95 (ref 0.00–1.49)
PROTHROMBIN TIME: 12.9 s (ref 11.6–15.2)

## 2016-07-03 LAB — MAGNESIUM: MAGNESIUM: 1.9 mg/dL (ref 1.7–2.4)

## 2016-07-03 LAB — TROPONIN I
TROPONIN I: 0.18 ng/mL — AB (ref <0.03)
TROPONIN I: 0.2 ng/mL — AB (ref <0.03)
Troponin I: 0.2 ng/mL (ref <0.03)
Troponin I: 0.21 ng/mL (ref <0.03)

## 2016-07-03 LAB — BRAIN NATRIURETIC PEPTIDE: B Natriuretic Peptide: 737 pg/mL — ABNORMAL HIGH (ref 0.0–100.0)

## 2016-07-03 LAB — D-DIMER, QUANTITATIVE (NOT AT ARMC): D DIMER QUANT: 1.12 ug{FEU}/mL — AB (ref 0.00–0.50)

## 2016-07-03 LAB — MRSA PCR SCREENING: MRSA by PCR: NEGATIVE

## 2016-07-03 MED ORDER — ASPIRIN 81 MG PO CHEW
324.0000 mg | CHEWABLE_TABLET | Freq: Once | ORAL | Status: AC
  Administered 2016-07-03: 324 mg via ORAL
  Filled 2016-07-03: qty 4

## 2016-07-03 MED ORDER — METOPROLOL TARTRATE 5 MG/5ML IV SOLN
5.0000 mg | Freq: Four times a day (QID) | INTRAVENOUS | Status: DC | PRN
  Administered 2016-07-05 (×2): 5 mg via INTRAVENOUS
  Filled 2016-07-03 (×2): qty 5

## 2016-07-03 MED ORDER — SODIUM CHLORIDE 0.9 % IV SOLN
INTRAVENOUS | Status: AC
  Administered 2016-07-03: 18:00:00 via INTRAVENOUS

## 2016-07-03 MED ORDER — IOPAMIDOL (ISOVUE-370) INJECTION 76%
100.0000 mL | Freq: Once | INTRAVENOUS | Status: AC | PRN
  Administered 2016-07-03: 100 mL via INTRAVENOUS

## 2016-07-03 MED ORDER — SODIUM CHLORIDE 0.9% FLUSH
3.0000 mL | Freq: Two times a day (BID) | INTRAVENOUS | Status: DC
  Administered 2016-07-04 – 2016-07-15 (×17): 3 mL via INTRAVENOUS

## 2016-07-03 MED ORDER — METOPROLOL TARTRATE 25 MG PO TABS
25.0000 mg | ORAL_TABLET | Freq: Two times a day (BID) | ORAL | Status: DC
  Administered 2016-07-03 – 2016-07-04 (×2): 25 mg via ORAL
  Filled 2016-07-03 (×2): qty 1

## 2016-07-03 MED ORDER — ACETAMINOPHEN 325 MG PO TABS
650.0000 mg | ORAL_TABLET | Freq: Four times a day (QID) | ORAL | Status: DC | PRN
  Administered 2016-07-03: 650 mg via ORAL
  Filled 2016-07-03: qty 2

## 2016-07-03 MED ORDER — HEPARIN BOLUS VIA INFUSION
4000.0000 [IU] | Freq: Once | INTRAVENOUS | Status: AC
  Administered 2016-07-03: 4000 [IU] via INTRAVENOUS

## 2016-07-03 MED ORDER — ATORVASTATIN CALCIUM 40 MG PO TABS
80.0000 mg | ORAL_TABLET | Freq: Every day | ORAL | Status: DC
  Administered 2016-07-04 – 2016-07-15 (×13): 80 mg via ORAL
  Filled 2016-07-03 (×14): qty 2

## 2016-07-03 MED ORDER — ACETAMINOPHEN 650 MG RE SUPP: 650.0000 mg | Freq: Four times a day (QID) | RECTAL | Status: DC | PRN

## 2016-07-03 MED ORDER — LEVOFLOXACIN IN D5W 500 MG/100ML IV SOLN: 500.0000 mg | INTRAVENOUS | Status: DC

## 2016-07-03 MED ORDER — HEPARIN (PORCINE) IN NACL 100-0.45 UNIT/ML-% IJ SOLN
1000.0000 [IU]/h | INTRAMUSCULAR | Status: DC
  Administered 2016-07-03: 1000 [IU]/h via INTRAVENOUS
  Filled 2016-07-03: qty 250

## 2016-07-03 MED ORDER — CETYLPYRIDINIUM CHLORIDE 0.05 % MT LIQD
7.0000 mL | Freq: Two times a day (BID) | OROMUCOSAL | Status: DC
  Administered 2016-07-03 – 2016-07-15 (×24): 7 mL via OROMUCOSAL

## 2016-07-03 MED ORDER — FUROSEMIDE 10 MG/ML IJ SOLN
40.0000 mg | Freq: Once | INTRAMUSCULAR | Status: AC
  Administered 2016-07-03: 40 mg via INTRAVENOUS
  Filled 2016-07-03: qty 4

## 2016-07-03 MED ORDER — POLYVINYL ALCOHOL 1.4 % OP SOLN
1.0000 [drp] | OPHTHALMIC | Status: DC | PRN
  Administered 2016-07-04: 1 [drp] via OPHTHALMIC
  Filled 2016-07-03: qty 15

## 2016-07-03 NOTE — ED Notes (Signed)
Pt states she was sent from Dr. Delanna Ahmadi office for possible new onset afib and chf.  Pt reports she has not been able to sleep for the past 3 nights.  States she mostly has symptoms at night and feels like she cannot breathe.

## 2016-07-03 NOTE — ED Notes (Signed)
CRITICAL VALUE ALERT  Critical value received:  Troponin .20 Date of notification: 07/03/16 Time of notification:  1312 Critical value read back:Yes.   Nurse who received alert:  Dellis Anes MD notified (1st page):  Dr. Kathrynn Humble

## 2016-07-03 NOTE — ED Provider Notes (Addendum)
CSN: [id number]     Arrival date & time 07/03/16  1128 History  By signing my name below, I, Jasmyn B. Alexander, attest that this documentation has been prepared under the direction and in the presence of Varney Biles, MD.  Electronically Signed: Tedra Coupe. Sheppard Coil, ED Scribe. 07/03/2016. 12:21 PM.   Chief Complaint  Patient presents with  . Shortness of Breath    The history is provided by the patient and the spouse. No language interpreter was used.    HPI Comments: Miranda Ford is a 82 y.o. female who presents to the Emergency Department complaining of intermittent episodes of shortness of breath x 3 days. She states that she is unable to catch her breath at night while she is trying to sleep. Pt states that shortness of breath is worsened upon exertion such as mowing the yard. However, SOB is relieved when she is sitting up. She states resting SOB is only present at night. Pt reports slight chest tightness and also palpitations on the first night of her shortness of breath episodes, but none since then. She notes that she was sent from her PCP's office for possible new onset of Atrial Fibrillation and CHF. Denies any hx of COPD, Asthma, DVT, PE or pertinent cardiac history. No recent surgeries or long-distance travel. Denies any diarrhea, neck bulging, fever, abdominal pain, dysuria, or active bleeding. She states this is the first time she has been in the hospital in 2001 when she had a cholecystectomy.  Past Medical History  Diagnosis Date  . Osteoporosis   . Cataracts, bilateral   . Macular degeneration    Past Surgical History  Procedure Laterality Date  . Cataract extraction      bilaterally  . Tonsillectomy    . Cholecystectomy     History reviewed. No pertinent family history. Social History  Substance Use Topics  . Smoking status: Former Smoker    Types: Cigarettes  . Smokeless tobacco: None  . Alcohol Use: No   OB History    Gravida Para Term Preterm AB TAB SAB  Ectopic Multiple Living   0              Review of Systems 10 Systems reviewed and all are negative for acute change except as noted in the HPI.  Allergies  Codeine and Penicillins  Home Medications   Prior to Admission medications   Medication Sig Start Date End Date Taking? Authorizing Provider  alendronate (FOSAMAX) 70 MG tablet Take 70 mg by mouth once a week. Take on Saturday's. Take with a full glass of water on an empty stomach.   Yes Historical Provider, MD  calcium carbonate (OSCAL) 1500 (600 Ca) MG TABS tablet Take 1,500 mg by mouth 2 (two) times daily with a meal.   Yes Historical Provider, MD  Multiple Vitamins-Minerals (ICAPS PO) Take 1 capsule by mouth 2 (two) times daily.   Yes Historical Provider, MD   BP 130/94 mmHg  Pulse 138  Temp(Src) 97.7 F (36.5 C)  Resp 20  Ht 5\' 3"  (1.6 m)  Wt 143 lb (64.864 kg)  BMI 25.34 kg/m2  SpO2 91% Physical Exam  Constitutional: She is oriented to person, place, and time. She appears well-developed and well-nourished. No distress.  HENT:  Head: Normocephalic and atraumatic.  Mouth/Throat: Oropharynx is clear and moist. No oropharyngeal exudate.  Moist mucous membranes   Eyes: Conjunctivae are normal. Pupils are equal, round, and reactive to light.  Neck: JVD present.  Trachea midline No  thyroid enlargement  Cardiovascular: Regular rhythm and normal heart sounds.   Tachycardia  Pulmonary/Chest: Effort normal. No stridor. No respiratory distress. She has rales (Bilaterally).  Abdominal: Soft. Bowel sounds are normal. She exhibits no distension.  Musculoskeletal: She exhibits edema.  Trace pitting edema bilaterally  Lymphadenopathy:    She has no cervical adenopathy.  Neurological: She is alert and oriented to person, place, and time. She has normal reflexes.  Skin: Skin is warm and dry.  Psychiatric: She has a normal mood and affect. Her behavior is normal.  Nursing note and vitals reviewed.   ED Course  .Critical  Care Performed by: Varney Biles Authorized by: Varney Biles Total critical care time: 45 minutes Critical care time was exclusive of separately billable procedures and treating other patients. Critical care was necessary to treat or prevent imminent or life-threatening deterioration of the following conditions: respiratory failure and cardiac failure. Critical care was time spent personally by me on the following activities: blood draw for specimens, development of treatment plan with patient or surrogate, discussions with consultants, evaluation of patient's response to treatment, examination of patient, obtaining history from patient or surrogate, ordering and performing treatments and interventions, ordering and review of laboratory studies, ordering and review of radiographic studies, pulse oximetry and re-evaluation of patient's condition.   (including critical care time) DIAGNOSTIC STUDIES: Oxygen Saturation is 95% on 2L/min via , normal by my interpretation.    COORDINATION OF CARE: 12:17 PM-Discussed treatment plan which includes Troponin, BMP, CBC, and TSH with pt at bedside and pt agreed to plan.   Labs Review Labs Reviewed  BASIC METABOLIC PANEL - Abnormal; Notable for the following:    BUN 21 (*)    All other components within normal limits  BRAIN NATRIURETIC PEPTIDE - Abnormal; Notable for the following:    B Natriuretic Peptide 737.0 (*)    All other components within normal limits  TROPONIN I - Abnormal; Notable for the following:    Troponin I 0.20 (*)    All other components within normal limits  D-DIMER, QUANTITATIVE (NOT AT [facility]) - Abnormal; Notable for the following:    D-Dimer, Quant 1.12 (*)    All other components within normal limits  CBC  TSH  PROTIME-INR  MAGNESIUM  URINALYSIS, ROUTINE W REFLEX MICROSCOPIC (NOT AT [facility])    Imaging Review Dg Chest 2 View  07/03/2016  CLINICAL DATA:  Shortness of breath for 3 days.  Former smoker. EXAM: CHEST  2  VIEW COMPARISON:  Chest x-ray dated 02/23/2004 FINDINGS: There is cardiomegaly. Atherosclerotic changes noted at the aortic arch. Lungs are hyperexpanded. Coarse interstitial markings are seen bilaterally, most likely chronic interstitial lung disease. More confluent opacities are seen at each lung base, right greater than left, of uncertain acuity. Suspect small pleural effusions. Mild degenerative spurring noted within the kyphotic thoracic spine. No acute or suspicious osseous finding. IMPRESSION: 1. Lungs are hyperexpanded suggesting COPD. Probable associated chronic interstitial lung disease. 2. Confluent opacities at each lung base. These could represent acute pneumonia or chronic confluent scarring/fibrosis. There are new compared to a previous chest x-ray dated 02/23/2004 but there are no recent prior studies available to assess chronicity. 3. Probable small pleural effusions bilaterally. 4. Aortic atherosclerosis. Electronically Signed   By: Franki Cabot M.D.   On: 07/03/2016 12:16   Ct Angio Chest Pe W/cm &/or Wo Cm  07/03/2016  CLINICAL DATA:  Shortness of breath for 3 days. EXAM: CT ANGIOGRAPHY CHEST WITH CONTRAST TECHNIQUE: Multidetector CT imaging of  the chest was performed using the standard protocol during bolus administration of intravenous contrast. Multiplanar CT image reconstructions and MIPs were obtained to evaluate the vascular anatomy. CONTRAST:  100 cc Isovue 370 COMPARISON:  Chest x-ray from earlier same day. FINDINGS: Mediastinum/Lymph Nodes: Some of the most peripheral segmental and subsegmental pulmonary artery branches to the lower lobes are difficult to definitively characterize due to patient breathing motion artifact but there is a single acute-appearing pulmonary embolus identified at the junction of the segmental pulmonary artery branches to the posterior and lateral segments of the right lower lobe. No other pulmonary emboli identified, with limitations detailed above.  Scattered atherosclerotic changes seen along the walls of the normal-caliber thoracic aorta. No aneurysm or dissection seen. Heart is enlarged. No pericardial effusion. Coronary artery calcifications noted. No mass or enlarged lymph nodes seen within the mediastinum or perihilar regions. Trachea and central bronchi are unremarkable. Lungs/Pleura: Bilateral moderate-sized pleural effusions, right slightly greater than left. Associated interstitial edema at the lung bases. Scarring/fibrosis and emphysematous changes noted within the upper lobes. No evidence of pneumonia seen. No pneumothorax. Upper abdomen: Limited images of the upper abdomen are unremarkable. Musculoskeletal: Mild degenerative spurring noted within the slightly scoliotic thoracic spine. Mild compression deformity of an upper thoracic vertebral body appears chronic. No acute or suspicious osseous finding. Superficial soft tissues are unremarkable. Review of the MIP images confirms the above findings. IMPRESSION: 1. Single acute-appearing pulmonary embolus identified at the junction of the segmental pulmonary artery branches to the posterior and lateral segments of the right lower lobe. No other pulmonary emboli identified, with mild study limitations detailed above. 2. Bilateral moderate-sized pleural effusions, right slightly greater than left. Associated interstitial edema and atelectasis at the right lung base. No evidence of pneumonia seen. 3. Scarring/fibrosis and emphysematous changes within the upper lobes bilaterally, mild to moderate in degree. 4. Aortic atherosclerosis. 5. Cardiomegaly. Coronary artery calcifications, particularly dense within the left anterior descending and circumflex coronary arteries. Recommend correlation with any possible associated cardiac symptoms. These results were called by telephone at the time of interpretation on 07/03/2016 at 3:03 pm to Dr. Varney Biles , who verbally acknowledged these results.  Electronically Signed   By: Franki Cabot M.D.   On: 07/03/2016 15:04   I have personally reviewed and evaluated these images and lab results as part of my medical decision-making.   EKG Interpretation   Date/Time:  Monday July 03 2016 11:41:26 EDT Ventricular Rate:  132 PR Interval:    QRS Duration: 120 QT Interval:  371 QTC Calculation: 550 R Axis:   110 Text Interpretation:  Sinus tachycardia RBBB and LPFB Nonspecific ST and T  wave abnormality T wave inversion in the inferior and lateral leads  anterior q waves No old tracing to compare Confirmed by Kathrynn Humble, MD,  [facility] [phone]) on 07/03/2016 1:40:17 PM       MDM   Final diagnoses:  Other acute pulmonary embolism without acute cor pulmonale (HCC)  Acute congestive heart failure, unspecified congestive heart failure type (HCC)  Elevated troponin    I personally performed the services described in this documentation, which was scribed in my presence. The recorded information has been reviewed and is accurate.  PT comes in with cc of DIB. Pt has no medical hx. She is having exertional dyspnea, orthopnea and PND. She also is noted to be tachycardic. Pt has no hx of PE, DVT and denies any exogenous estrogen use, long distance travels or surgery in the past 6 weeks,  active cancer, recent immobilization.  DDX: Differential diagnosis includes: ACS syndrome CHF exacerbation Valvular disorder Myocarditis Pericardial effusion Pulmonary edema Pleural effusion PE Anemia COPD  Hyperthyroidism  We will get basic labs and initiated cardiopulm workup. The EKG has some T wave inversions and non specific changes. There is also conduction delay. Pt does have right ward axis. Dimer ordered.   1:50 pm Dimer is elevated, BNP is elevated. CT PE ordered. Trop is slightly elevated.  Varney Biles, MD 07/03/16 1351  3:10 pm: + PE, R sided. No R sided strain however. So although we have a PE - not clear if the PE is causing  troponin elevation and R sided heart failure or the HR in the 140s directly (it's likely contributing but doesn't appear to be the main source). Thus i am presuming that she has some underlying cardiac process going on as well - including CHF. Ct scan does show Cardiomegaly. EKG has some non specific changes as well, but likely demand related. We have consulted Cards - Dr. Harrington Challenger will see the patient. Admit to medicine.  Varney Biles, MD 07/03/16 [phone]

## 2016-07-03 NOTE — Progress Notes (Signed)
ANTICOAGULATION CONSULT NOTE - Initial Consult  Pharmacy Consult for Heparin Indication: pulmonary embolus  Allergies  Allergen Reactions  . Codeine Nausea Only  . Penicillins Other (See Comments)    "Not allergic just a lot of my family members are allergic". Has patient had a PCN reaction causing immediate rash, facial/tongue/throat swelling, SOB or lightheadedness with hypotension: No Has patient had a PCN reaction causing severe rash involving mucus membranes or skin necrosis: No Has patient had a PCN reaction that required hospitalization No Has patient had a PCN reaction occurring within the last 10 years: No If all of the above answers are "NO", then may proceed with Cephalosporin use.     Patient Measurements: Height: 5\' 3"  (160 cm) Weight: 143 lb (64.864 kg) IBW/kg (Calculated) : 52.4 Heparin Dosing Weight: 64.9 kg  Vital Signs: Temp: 97.7 F (36.5 C) (07/10 1132) BP: 130/94 mmHg (07/10 1132) Pulse Rate: 138 (07/10 1132)  Labs:  Recent Labs  07/03/16 1145  HGB 14.3  HCT 44.0  PLT 221  LABPROT 12.9  INR 0.95  CREATININE 0.78  TROPONINI 0.20*    Estimated Creatinine Clearance: 43.2 mL/min (by C-G formula based on Cr of 0.78).   Medical History: Past Medical History  Diagnosis Date  . Osteoporosis   . Cataracts, bilateral   . Macular degeneration     Medications:  Scheduled:  . aspirin  324 mg Oral Once  . heparin  4,000 Units Intravenous Once    Assessment: 82 y.o. female who presents to the Emergency Department complaining of intermittent episodes of shortness of breath x 3 days. Pharmacy consult for heparin for PE Labs reviewed PTA medications reviewed  Goal of Therapy:  Heparin level 0.3-0.7 units/ml Monitor platelets by anticoagulation protocol: Yes   Plan:  Give 4000 units bolus x 1 Start heparin infusion at 1000 units/hr Check anti-Xa level in 6-8  hours and daily while on heparin Continue to monitor H&H and platelets  Abner Greenspan,  Tawny Raspberry Bennett 07/03/2016,3:15 PM

## 2016-07-03 NOTE — ED Notes (Signed)
Pt placed on 2L [place] 02 for 02 sats 88%, Pt quickly returned to 95% on 2L.

## 2016-07-03 NOTE — ED Notes (Addendum)
Pt ambulated to bathroom to urinate for urine sample and have bm, walked without difficulty and without exhaustion.  Respirations equal and unlabored at 22/min when walking.  Pt placed back in bed and Dr. Kathrynn Humble met pt at bedside to provide update.  Dr. Kathrynn Humble requested to see what pt was on RA for 02.  When placing pt back on monitor, hr was 150 and 02 sats 82%.  Dr. Kathrynn Humble updated cardiologist on findings and still find pt appropriate to go to telemetry floor.

## 2016-07-03 NOTE — Progress Notes (Signed)
Pt's HR sustaining in 130-145 range.  Regular QRS complexes appear to be NSR on tele monitor.  MD informed.  No new orders given at this time.

## 2016-07-03 NOTE — ED Notes (Signed)
MD discussing ct results with pt and family at this time.

## 2016-07-03 NOTE — ED Notes (Signed)
Pt taken to xr  

## 2016-07-03 NOTE — H&P (Signed)
TRH H&P   Patient Demographics:    Miranda Ford, is a 82 y.o. female  MRN: [id number]   DOB - 1924-10-08  Admit Date - 07/03/2016  Outpatient Primary MD for the patient is Purvis Kilts, MD  Referring MD/NP/PA:   Kathrynn Humble  Outpatient Specialists: none  Patient coming from: home  Chief Complaint  Patient presents with  . Shortness of Breath      HPI:    Miranda Ford  is a 82 y.o. female, w hx Osteoporosis,  Negative family of blood clot or miscarriage, c/o dyspnea x 3 days.  Pt denies fever, chills, cp, palp. Pt denies recent travel, surgery or weight loss.  Pt presented to ED for evaluation.    In ED,  CTA => 1. Single acute-appearing pulmonary embolus identified at the junction of the segmental pulmonary artery branches to the posterior and lateral segments of the right lower lobe. No other pulmonary emboli identified, with mild study limitations detailed above. 2. Bilateral moderate-sized pleural effusions, right slightly greater than left. Associated interstitial edema and atelectasis at the right lung base. No evidence of pneumonia seen. 3. Scarring/fibrosis and emphysematous changes within the upper lobes bilaterally, mild to moderate in degree. 4. Aortic atherosclerosis. 5. Cardiomegaly. Coronary artery calcifications, particularly dense within the left anterior descending and circumflex coronary arteries. Recommend correlation with any possible associated cardiac Symptoms.  Pt will be admitted for PE.      Review of systems:    In addition to the HPI above,  No Fever-chills, No Headache, No changes with Vision or hearing, No problems swallowing food or Liquids, No Chest pain, Cough  No Abdominal pain, No Nausea or Vommitting, Bowel movements are regular, No Blood in stool or Urine, No dysuria, No new skin rashes or bruises, No new joints  pains-aches,  No new weakness, tingling, numbness in any extremity, No recent weight gain or loss, No polyuria, polydypsia or polyphagia, No significant Mental Stressors.  A full 10 point Review of Systems was done, except as stated above, all other Review of Systems were negative.   With Past History of the following :    Past Medical History  Diagnosis Date  . Osteoporosis   . Cataracts, bilateral   . Macular degeneration       Past Surgical History  Procedure Laterality Date  . Cataract extraction      bilaterally  . Tonsillectomy    . Cholecystectomy        Social History:     Social History  Substance Use Topics  . Smoking status: Former Smoker    Types: Cigarettes  . Smokeless tobacco: Not on file  . Alcohol Use: No     Lives - at home  Mobility - ambulates by self    Family History :    History reviewed. No pertinent family history.   Negative for blood clot.  Home Medications:   Prior to Admission medications   Medication Sig Start Date End Date Taking? Authorizing Provider  alendronate (FOSAMAX) 70 MG tablet Take 70 mg by mouth once a week. Take on Saturday's. Take with a full glass of water on an empty stomach.   Yes Historical Provider, MD  calcium carbonate (OSCAL) 1500 (600 Ca) MG TABS tablet Take 1,500 mg by mouth 2 (two) times daily with a meal.   Yes Historical Provider, MD  Multiple Vitamins-Minerals (ICAPS PO) Take 1 capsule by mouth 2 (two) times daily.   Yes Historical Provider, MD     Allergies:     Allergies  Allergen Reactions  . Codeine Nausea Only  . Penicillins Other (See Comments)    "Not allergic just a lot of my family members are allergic". Has patient had a PCN reaction causing immediate rash, facial/tongue/throat swelling, SOB or lightheadedness with hypotension: No Has patient had a PCN reaction causing severe rash involving mucus membranes or skin necrosis: No Has patient had a PCN reaction that required  hospitalization No Has patient had a PCN reaction occurring within the last 10 years: No If all of the above answers are "NO", then may proceed with Cephalosporin use.      Physical Exam:   Vitals  Blood pressure 128/96, pulse 59, temperature 97.7 F (36.5 C), resp. rate 23, height 5\' 3"  (1.6 m), weight 64.864 kg (143 lb), SpO2 95 %.   1. General  lying in bed in NAD,    2. Normal affect and insight, Not Suicidal or Homicidal, Awake Alert, Oriented X 3.  3. No F.N deficits, ALL C.Nerves Intact, Strength 5/5 all 4 extremities, Sensation intact all 4 extremities, Plantars down going.  4. Ears and Eyes appear Normal, Conjunctivae clear, PERRLA. Moist Oral Mucosa.  5. Supple Neck, No JVD, No cervical lymphadenopathy appriciated, No Carotid Bruits.  6. Symmetrical Chest wall movement, Good air movement bilaterally, CTAB.  7. Tachy s1, s2, No Gallops, Rubs or Murmurs, No Parasternal Heave.  8. Positive Bowel Sounds, Abdomen Soft, No tenderness, No organomegaly appriciated,No rebound -guarding or rigidity.  9.  No Cyanosis, Normal Skin Turgor, No Skin Rash or Bruise.  10. Good muscle tone,  joints appear normal , no effusions, Normal ROM.  11. No Palpable Lymph Nodes in Neck or Axillae     Data Review:    CBC  Recent Labs Lab 07/03/16 1145  WBC 8.0  HGB 14.3  HCT 44.0  PLT 221  MCV 94.0  MCH 30.6  MCHC 32.5  RDW 14.3   ------------------------------------------------------------------------------------------------------------------  Chemistries   Recent Labs Lab 07/03/16 1145  NA 135  K 4.6  CL 101  CO2 26  GLUCOSE 93  BUN 21*  CREATININE 0.78  CALCIUM 8.9  MG 1.9   ------------------------------------------------------------------------------------------------------------------ estimated creatinine clearance is 43.2 mL/min (by C-G formula based on Cr of  0.78). ------------------------------------------------------------------------------------------------------------------  Recent Labs  07/03/16 1145  TSH 2.004    Coagulation profile  Recent Labs Lab 07/03/16 1145  INR 0.95   -------------------------------------------------------------------------------------------------------------------  Recent Labs  07/03/16 1145  DDIMER 1.12*   -------------------------------------------------------------------------------------------------------------------  Cardiac Enzymes  Recent Labs Lab 07/03/16 1145  TROPONINI 0.20*   ------------------------------------------------------------------------------------------------------------------    Component Value Date/Time   BNP 737.0* 07/03/2016 1145     ---------------------------------------------------------------------------------------------------------------  Urinalysis    Component Value Date/Time   COLORURINE YELLOW 07/03/2016 1557   APPEARANCEUR CLEAR 07/03/2016 1557   LABSPEC 1.010 07/03/2016 1557   PHURINE 5.5 07/03/2016 1557  GLUCOSEU NEGATIVE 07/03/2016 [place] 07/03/2016 [place] 07/03/2016 1557   KETONESUR NEGATIVE 07/03/2016 1557   PROTEINUR NEGATIVE 07/03/2016 1557   NITRITE NEGATIVE 07/03/2016 1557   LEUKOCYTESUR NEGATIVE 07/03/2016 1557    ----------------------------------------------------------------------------------------------------------------   Imaging Results:    Dg Chest 2 View  07/03/2016  CLINICAL DATA:  Shortness of breath for 3 days.  Former smoker. EXAM: CHEST  2 VIEW COMPARISON:  Chest x-ray dated 02/23/2004 FINDINGS: There is cardiomegaly. Atherosclerotic changes noted at the aortic arch. Lungs are hyperexpanded. Coarse interstitial markings are seen bilaterally, most likely chronic interstitial lung disease. More confluent opacities are seen at each lung base, right greater than left, of uncertain  acuity. Suspect small pleural effusions. Mild degenerative spurring noted within the kyphotic thoracic spine. No acute or suspicious osseous finding. IMPRESSION: 1. Lungs are hyperexpanded suggesting COPD. Probable associated chronic interstitial lung disease. 2. Confluent opacities at each lung base. These could represent acute pneumonia or chronic confluent scarring/fibrosis. There are new compared to a previous chest x-ray dated 02/23/2004 but there are no recent prior studies available to assess chronicity. 3. Probable small pleural effusions bilaterally. 4. Aortic atherosclerosis. Electronically Signed   By: Franki Cabot M.D.   On: 07/03/2016 12:16   Ct Angio Chest Pe W/cm &/or Wo Cm  07/03/2016  CLINICAL DATA:  Shortness of breath for 3 days. EXAM: CT ANGIOGRAPHY CHEST WITH CONTRAST TECHNIQUE: Multidetector CT imaging of the chest was performed using the standard protocol during bolus administration of intravenous contrast. Multiplanar CT image reconstructions and MIPs were obtained to evaluate the vascular anatomy. CONTRAST:  100 cc Isovue 370 COMPARISON:  Chest x-ray from earlier same day. FINDINGS: Mediastinum/Lymph Nodes: Some of the most peripheral segmental and subsegmental pulmonary artery branches to the lower lobes are difficult to definitively characterize due to patient breathing motion artifact but there is a single acute-appearing pulmonary embolus identified at the junction of the segmental pulmonary artery branches to the posterior and lateral segments of the right lower lobe. No other pulmonary emboli identified, with limitations detailed above. Scattered atherosclerotic changes seen along the walls of the normal-caliber thoracic aorta. No aneurysm or dissection seen. Heart is enlarged. No pericardial effusion. Coronary artery calcifications noted. No mass or enlarged lymph nodes seen within the mediastinum or perihilar regions. Trachea and central bronchi are unremarkable. Lungs/Pleura:  Bilateral moderate-sized pleural effusions, right slightly greater than left. Associated interstitial edema at the lung bases. Scarring/fibrosis and emphysematous changes noted within the upper lobes. No evidence of pneumonia seen. No pneumothorax. Upper abdomen: Limited images of the upper abdomen are unremarkable. Musculoskeletal: Mild degenerative spurring noted within the slightly scoliotic thoracic spine. Mild compression deformity of an upper thoracic vertebral body appears chronic. No acute or suspicious osseous finding. Superficial soft tissues are unremarkable. Review of the MIP images confirms the above findings. IMPRESSION: 1. Single acute-appearing pulmonary embolus identified at the junction of the segmental pulmonary artery branches to the posterior and lateral segments of the right lower lobe. No other pulmonary emboli identified, with mild study limitations detailed above. 2. Bilateral moderate-sized pleural effusions, right slightly greater than left. Associated interstitial edema and atelectasis at the right lung base. No evidence of pneumonia seen. 3. Scarring/fibrosis and emphysematous changes within the upper lobes bilaterally, mild to moderate in degree. 4. Aortic atherosclerosis. 5. Cardiomegaly. Coronary artery calcifications, particularly dense within the left anterior descending and circumflex coronary arteries. Recommend correlation with any possible associated cardiac symptoms. These results were called by  telephone at the time of interpretation on 07/03/2016 at 3:03 pm to Dr. Varney Biles , who verbally acknowledged these results. Electronically Signed   By: Franki Cabot M.D.   On: 07/03/2016 15:04   EKG sinus tach at 130.  RBBB, LAFB   Assessment & Plan:    Active Problems:   Acute pulmonary embolism (HCC)   Tachycardia   Pulmonary embolism ([place])    1. Dyspnea Secondary to PE.   2. PE Iv heparin Transition to coumadin  3.  Tachycardia Trop i q6h x3.   4.  Trop  elevation Likely due to demand ischemia Aspirin, Lipitor, metoprolol  25mg  po bid Appreciate cardiology input  5.  ? CHF Check echo Lasix given in ED Appreciate Cardiology input   DVT Prophylaxis Heparin - SCDs   AM Labs Ordered, also please review Full Orders  Family Communication: Admission, patients condition and plan of care including tests being ordered have been discussed with the patient  who indicate understanding and agree with the plan and Code Status.  Code Status  FULL CODE  Likely DC to  home  Condition GUARDED    Consults called: cardiology  Admission status: inpatient  Time spent in minutes : 45 minutes,  Critical care    Jani Gravel M.D on 07/03/2016 at 4:22 PM  Between 7am to 7pm - Pager - [phone]. After 7pm go to [email or web address] - password [facility]  [facility] - Office  [phone]

## 2016-07-03 NOTE — Consult Note (Signed)
Primary Physician: Primary Cardiologist:  New    Asked to see Re CHF  HPI:Pt presents to ER with 3 day history of SOB  Worse with exertion  Relieved with stiting up  Some palpiations .  Sent to PCP  Tachycardic  Sent to ER    Pt denies CP  Does say breathing has been bad for 3 days  Denies cough or fever.  No palpitations  No dizziness  No falls No recent injury or long distance trips      Past Medical History  Diagnosis Date  . Osteoporosis   . Cataracts, bilateral   . Macular degeneration      (Not in a hospital admission)   . aspirin  324 mg Oral Once    Infusions:    Allergies  Allergen Reactions  . Codeine Nausea Only  . Penicillins Other (See Comments)    "Not allergic just a lot of my family members are allergic". Has patient had a PCN reaction causing immediate rash, facial/tongue/throat swelling, SOB or lightheadedness with hypotension: No Has patient had a PCN reaction causing severe rash involving mucus membranes or skin necrosis: No Has patient had a PCN reaction that required hospitalization No Has patient had a PCN reaction occurring within the last 10 years: No If all of the above answers are "NO", then may proceed with Cephalosporin use.     Social History   Social History  . Marital Status: Married    Spouse Name: N/A  . Number of Children: N/A  . Years of Education: N/A   Occupational History  . Not on file.   Social History Main Topics  . Smoking status: Former Smoker    Types: Cigarettes  . Smokeless tobacco: Not on file  . Alcohol Use: No  . Drug Use: No  . Sexual Activity: Not Currently   Other Topics Concern  . Not on file   Social History Narrative   FHX  No definite history of CAD  History reviewed. No pertinent family history.  REVIEW OF SYSTEMS:  All systems reviewed  Negative to the above problem except as noted above.    PHYSICAL EXAM: Filed Vitals:   07/03/16 1132  BP: 130/94  Pulse: 138  Temp: 97.7 F  (36.5 C)  Resp: 20    No intake or output data in the 24 hours ending 07/03/16 1518  General:  Thin 82 yo in NAD  . No respiratory difficulty HEENT: normal Neck: supple. no JVD. Carotids 2+ bilat; no bruits. No lymphadenopathy or thryomegaly appreciated. Cor: PMI nondisplaced. Regular rate & rhythm. No rubs, gallops or murmurs.   Lungs: Rales R greater than L base   Abdomen: soft, nontender, nondistended. No hepatosplenomegaly. No bruits or masses. Good bowel sounds. Extremities: no cyanosis, clubbing, rash, edema Neuro: alert & oriented x 3, cranial nerves grossly intact. moves all 4 extremities w/o difficulty. Affect pleasant.  ECG:  Atrial tachycardia with  Occasional PAC  140 bpm  RBBB  LPFB  Possible lateral MI   Tele:  Pt tachycardic at approximately 128 bpm  This abruptly slows to 90 bpm SR for several seconds then back up to 128 (atrial tach)  Results for orders placed or performed during the hospital encounter of 07/03/16 (from the past 24 hour(s))  Basic metabolic panel     Status: Abnormal   Collection Time: 07/03/16 11:45 AM  Result Value Ref Range   Sodium 135 135 - 145 mmol/L   Potassium 4.6 3.5 -  5.1 mmol/L   Chloride 101 101 - 111 mmol/L   CO2 26 22 - 32 mmol/L   Glucose, Bld 93 65 - 99 mg/dL   BUN 21 (H) 6 - 20 mg/dL   Creatinine, Ser 0.78 0.44 - 1.00 mg/dL   Calcium 8.9 8.9 - 10.3 mg/dL   GFR calc non Af Amer >60 >60 mL/min   GFR calc Af Amer >60 >60 mL/min   Anion gap 8 5 - 15  CBC     Status: None   Collection Time: 07/03/16 11:45 AM  Result Value Ref Range   WBC 8.0 4.0 - 10.5 K/uL   RBC 4.68 3.87 - 5.11 MIL/uL   Hemoglobin 14.3 12.0 - 15.0 g/dL   HCT 44.0 36.0 - 46.0 %   MCV 94.0 78.0 - 100.0 fL   MCH 30.6 26.0 - 34.0 pg   MCHC 32.5 30.0 - 36.0 g/dL   RDW 14.3 11.5 - 15.5 %   Platelets 221 150 - 400 K/uL  Brain natriuretic peptide (order if patient c/o SOB ONLY)     Status: Abnormal   Collection Time: 07/03/16 11:45 AM  Result Value Ref Range    B Natriuretic Peptide 737.0 (H) 0.0 - 100.0 pg/mL  Troponin I     Status: Abnormal   Collection Time: 07/03/16 11:45 AM  Result Value Ref Range   Troponin I 0.20 (HH) <0.03 ng/mL  TSH     Status: None   Collection Time: 07/03/16 11:45 AM  Result Value Ref Range   TSH 2.004 0.350 - 4.500 uIU/mL  Protime-INR     Status: None   Collection Time: 07/03/16 11:45 AM  Result Value Ref Range   Prothrombin Time 12.9 11.6 - 15.2 seconds   INR 0.95 0.00 - 1.49  Magnesium     Status: None   Collection Time: 07/03/16 11:45 AM  Result Value Ref Range   Magnesium 1.9 1.7 - 2.4 mg/dL  D-dimer, quantitative (not at [facility])     Status: Abnormal   Collection Time: 07/03/16 11:45 AM  Result Value Ref Range   D-Dimer, Quant 1.12 (H) 0.00 - 0.50 ug/mL-FEU   Dg Chest 2 View  07/03/2016  CLINICAL DATA:  Shortness of breath for 3 days.  Former smoker. EXAM: CHEST  2 VIEW COMPARISON:  Chest x-ray dated 02/23/2004 FINDINGS: There is cardiomegaly. Atherosclerotic changes noted at the aortic arch. Lungs are hyperexpanded. Coarse interstitial markings are seen bilaterally, most likely chronic interstitial lung disease. More confluent opacities are seen at each lung base, right greater than left, of uncertain acuity. Suspect small pleural effusions. Mild degenerative spurring noted within the kyphotic thoracic spine. No acute or suspicious osseous finding. IMPRESSION: 1. Lungs are hyperexpanded suggesting COPD. Probable associated chronic interstitial lung disease. 2. Confluent opacities at each lung base. These could represent acute pneumonia or chronic confluent scarring/fibrosis. There are new compared to a previous chest x-ray dated 02/23/2004 but there are no recent prior studies available to assess chronicity. 3. Probable small pleural effusions bilaterally. 4. Aortic atherosclerosis. Electronically Signed   By: Franki Cabot M.D.   On: 07/03/2016 12:16   Ct Angio Chest Pe W/cm &/or Wo Cm  07/03/2016  CLINICAL DATA:   Shortness of breath for 3 days. EXAM: CT ANGIOGRAPHY CHEST WITH CONTRAST TECHNIQUE: Multidetector CT imaging of the chest was performed using the standard protocol during bolus administration of intravenous contrast. Multiplanar CT image reconstructions and MIPs were obtained to evaluate the vascular anatomy. CONTRAST:  100 cc Isovue  370 COMPARISON:  Chest x-ray from earlier same day. FINDINGS: Mediastinum/Lymph Nodes: Some of the most peripheral segmental and subsegmental pulmonary artery branches to the lower lobes are difficult to definitively characterize due to patient breathing motion artifact but there is a single acute-appearing pulmonary embolus identified at the junction of the segmental pulmonary artery branches to the posterior and lateral segments of the right lower lobe. No other pulmonary emboli identified, with limitations detailed above. Scattered atherosclerotic changes seen along the walls of the normal-caliber thoracic aorta. No aneurysm or dissection seen. Heart is enlarged. No pericardial effusion. Coronary artery calcifications noted. No mass or enlarged lymph nodes seen within the mediastinum or perihilar regions. Trachea and central bronchi are unremarkable. Lungs/Pleura: Bilateral moderate-sized pleural effusions, right slightly greater than left. Associated interstitial edema at the lung bases. Scarring/fibrosis and emphysematous changes noted within the upper lobes. No evidence of pneumonia seen. No pneumothorax. Upper abdomen: Limited images of the upper abdomen are unremarkable. Musculoskeletal: Mild degenerative spurring noted within the slightly scoliotic thoracic spine. Mild compression deformity of an upper thoracic vertebral body appears chronic. No acute or suspicious osseous finding. Superficial soft tissues are unremarkable. Review of the MIP images confirms the above findings. IMPRESSION: 1. Single acute-appearing pulmonary embolus identified at the junction of the segmental  pulmonary artery branches to the posterior and lateral segments of the right lower lobe. No other pulmonary emboli identified, with mild study limitations detailed above. 2. Bilateral moderate-sized pleural effusions, right slightly greater than left. Associated interstitial edema and atelectasis at the right lung base. No evidence of pneumonia seen. 3. Scarring/fibrosis and emphysematous changes within the upper lobes bilaterally, mild to moderate in degree. 4. Aortic atherosclerosis. 5. Cardiomegaly. Coronary artery calcifications, particularly dense within the left anterior descending and circumflex coronary arteries. Recommend correlation with any possible associated cardiac symptoms. These results were called by telephone at the time of interpretation on 07/03/2016 at 3:03 pm to Dr. Varney Biles , who verbally acknowledged these results. Electronically Signed   By: Franki Cabot M.D.   On: 07/03/2016 15:04     ASSESSMENT: Pt is an 82 yo who presents for SOB  No CP  ON exam, evid of some pulmonary edema  Pt tachycardic and mildly hypertensive   Tele shows mostly tachycardia that is probably atrial tach.  There are short spells (seconds) of SR with reversion to tachycardia.  This may explain the L sided CHF  I would recomm  1.  CHF  Admit to telemetry  Echo in AM to evaluate LV and RV function.  Lopressor  Titrate as BP allows.  Lasix to diurese  Strict I/O  2.  PE  Pt has been started on anticagulation  Would transition  to oral agents (NOAC)  Etiology is not clear  WOuld get LE doppler  3.  Elevated trop  Follow trend  I am not convinced of active ischemia  Mild elevation may be due to rapid HR in setting of PE I would not pursue any ischemic work up given PE and also given the pt is 89 with no CP.    Will continue to follow

## 2016-07-04 ENCOUNTER — Inpatient Hospital Stay (HOSPITAL_COMMUNITY): Payer: Medicare Other

## 2016-07-04 DIAGNOSIS — I2692 Saddle embolus of pulmonary artery without acute cor pulmonale: Secondary | ICD-10-CM

## 2016-07-04 DIAGNOSIS — I2510 Atherosclerotic heart disease of native coronary artery without angina pectoris: Secondary | ICD-10-CM

## 2016-07-04 DIAGNOSIS — E663 Overweight: Secondary | ICD-10-CM | POA: Diagnosis not present

## 2016-07-04 DIAGNOSIS — I509 Heart failure, unspecified: Secondary | ICD-10-CM

## 2016-07-04 DIAGNOSIS — I248 Other forms of acute ischemic heart disease: Secondary | ICD-10-CM | POA: Insufficient documentation

## 2016-07-04 DIAGNOSIS — I517 Cardiomegaly: Secondary | ICD-10-CM

## 2016-07-04 DIAGNOSIS — I471 Supraventricular tachycardia: Secondary | ICD-10-CM | POA: Insufficient documentation

## 2016-07-04 LAB — TROPONIN I: Troponin I: 0.18 ng/mL (ref <0.03)

## 2016-07-04 LAB — PROTIME-INR
INR: 1.2 (ref 0.00–1.49)
PROTHROMBIN TIME: 15.4 s — AB (ref 11.6–15.2)

## 2016-07-04 LAB — COMPREHENSIVE METABOLIC PANEL
ALBUMIN: 3.1 g/dL — AB (ref 3.5–5.0)
ALK PHOS: 93 U/L (ref 38–126)
ALT: 32 U/L (ref 14–54)
AST: 33 U/L (ref 15–41)
Anion gap: 4 — ABNORMAL LOW (ref 5–15)
BILIRUBIN TOTAL: 0.8 mg/dL (ref 0.3–1.2)
BUN: 18 mg/dL (ref 6–20)
CO2: 31 mmol/L (ref 22–32)
CREATININE: 0.85 mg/dL (ref 0.44–1.00)
Calcium: 7.9 mg/dL — ABNORMAL LOW (ref 8.9–10.3)
Chloride: 103 mmol/L (ref 101–111)
GFR calc Af Amer: >60 mL/min (ref >60)
GFR calc non Af Amer: 59 mL/min — ABNORMAL LOW (ref >60)
GLUCOSE: 98 mg/dL (ref 65–99)
POTASSIUM: 3.7 mmol/L (ref 3.5–5.1)
Sodium: 138 mmol/L (ref 135–145)
TOTAL PROTEIN: 5.5 g/dL — AB (ref 6.5–8.1)

## 2016-07-04 LAB — CBC
HEMATOCRIT: 40.4 % (ref 36.0–46.0)
Hemoglobin: 12.9 g/dL (ref 12.0–15.0)
MCH: 30.1 pg (ref 26.0–34.0)
MCHC: 31.9 g/dL (ref 30.0–36.0)
MCV: 94.4 fL (ref 78.0–100.0)
PLATELETS: 196 10*3/uL (ref 150–400)
RBC: 4.28 MIL/uL (ref 3.87–5.11)
RDW: 14.5 % (ref 11.5–15.5)
WBC: 6.9 10*3/uL (ref 4.0–10.5)

## 2016-07-04 LAB — HEPARIN LEVEL (UNFRACTIONATED)
HEPARIN UNFRACTIONATED: 0.56 [IU]/mL (ref 0.30–0.70)
Heparin Unfractionated: 0.62 IU/mL (ref 0.30–0.70)

## 2016-07-04 LAB — ECHOCARDIOGRAM COMPLETE
Height: 63.5 in
WEIGHTICAEL: 2275.15 [oz_av]

## 2016-07-04 LAB — LIPID PANEL
Cholesterol: 141 mg/dL (ref 0–200)
HDL: 56 mg/dL (ref >40)
LDL CALC: 77 mg/dL (ref 0–99)
TRIGLYCERIDES: 41 mg/dL (ref <150)
Total CHOL/HDL Ratio: 2.5 RATIO
VLDL: 8 mg/dL (ref 0–40)

## 2016-07-04 LAB — TSH: TSH: 2.258 u[IU]/mL (ref 0.350–4.500)

## 2016-07-04 MED ORDER — AMIODARONE HCL 200 MG PO TABS
200.0000 mg | ORAL_TABLET | Freq: Two times a day (BID) | ORAL | Status: DC
  Administered 2016-07-04 – 2016-07-05 (×3): 200 mg via ORAL
  Filled 2016-07-04 (×3): qty 1

## 2016-07-04 MED ORDER — APIXABAN 5 MG PO TABS: 5.0000 mg | ORAL_TABLET | Freq: Two times a day (BID) | ORAL | Status: DC

## 2016-07-04 MED ORDER — DEXTROSE-NACL 5-0.9 % IV SOLN
INTRAVENOUS | Status: DC
  Administered 2016-07-04 – 2016-07-05 (×2): via INTRAVENOUS

## 2016-07-04 MED ORDER — APIXABAN 5 MG PO TABS
10.0000 mg | ORAL_TABLET | Freq: Two times a day (BID) | ORAL | Status: DC
Start: 2016-07-04 — End: 2016-07-05
  Administered 2016-07-04 (×2): 10 mg via ORAL
  Filled 2016-07-04 (×3): qty 2

## 2016-07-04 NOTE — Progress Notes (Signed)
[facility] PROGRESS NOTE  Miranda Ford [id number] DOB: 03-04-1924    PCP:   Purvis Kilts, MD   HPI: Miranda Ford is an 82 y.o. female admitted July 10 for acute PE and started on IV Heparin.  She is stable, but had atrial tachycardia, and was started on Lopressor with some improvement.  Cardiology was consulted, and is following.  She is sitting up without complaints.  Rewiew of Systems:  Constitutional: Negative for malaise, fever and chills. No significant weight loss or weight gain Eyes: Negative for eye pain, redness and discharge, diplopia, visual changes, or flashes of light. ENMT: Negative for ear pain, hoarseness, nasal congestion, sinus pressure and sore throat. No headaches; tinnitus, drooling, or problem swallowing. Cardiovascular: Negative for chest pain, palpitations, diaphoresis, dyspnea and peripheral edema. ; No orthopnea, PND Respiratory: Negative for cough, hemoptysis, wheezing and stridor. No pleuritic chestpain. Gastrointestinal: Negative for nausea, vomiting, diarrhea, constipation, abdominal pain, melena, blood in stool, hematemesis, jaundice and rectal bleeding.    Genitourinary: Negative for frequency, dysuria, incontinence,flank pain and hematuria; Musculoskeletal: Negative for back pain and neck pain. Negative for swelling and trauma.;  Skin: . Negative for pruritus, rash, abrasions, bruising and skin lesion.; ulcerations Neuro: Negative for headache, lightheadedness and neck stiffness. Negative for weakness, altered level of consciousness , altered mental status, extremity weakness, burning feet, involuntary movement, seizure and syncope.  Psych: negative for anxiety, depression, insomnia, tearfulness, panic attacks, hallucinations, paranoia, suicidal or homicidal ideation    Past Medical History  Diagnosis Date  . Osteoporosis   . Cataracts, bilateral   . Macular degeneration     Past Surgical History  Procedure Laterality Date  .  Cataract extraction      bilaterally  . Tonsillectomy    . Cholecystectomy      Medications:  HOME MEDS: Prior to Admission medications   Medication Sig Start Date End Date Taking? Authorizing Provider  alendronate (FOSAMAX) 70 MG tablet Take 70 mg by mouth once a week. Take on Saturday's. Take with a full glass of water on an empty stomach.   Yes Historical Provider, MD  calcium carbonate (OSCAL) 1500 (600 Ca) MG TABS tablet Take 1,500 mg by mouth 2 (two) times daily with a meal.   Yes Historical Provider, MD  Multiple Vitamins-Minerals (ICAPS PO) Take 1 capsule by mouth 2 (two) times daily.   Yes Historical Provider, MD     Allergies:  Allergies  Allergen Reactions  . Codeine Nausea Only  . Penicillins Other (See Comments)    "Not allergic just a lot of my family members are allergic". Has patient had a PCN reaction causing immediate rash, facial/tongue/throat swelling, SOB or lightheadedness with hypotension: No Has patient had a PCN reaction causing severe rash involving mucus membranes or skin necrosis: No Has patient had a PCN reaction that required hospitalization No Has patient had a PCN reaction occurring within the last 10 years: No If all of the above answers are "NO", then may proceed with Cephalosporin use.     Social History:   reports that she has quit smoking. Her smoking use included Cigarettes. She does not have any smokeless tobacco history on file. She reports that she does not drink alcohol or use illicit drugs.  Family History: History reviewed. No pertinent family history.   Physical Exam: Filed Vitals:   07/04/16 0830 07/04/16 0845 07/04/16 0900 07/04/16 0908  BP: 95/56 92/72 88/75  91/74  Pulse: 133 126 122   Temp:  TempSrc:      Resp: 25 26 27    Height:      Weight:      SpO2: 94% 94% 95%    Blood pressure 91/74, pulse 122, temperature 94.6 F (34.8 C), temperature source Oral, resp. rate 27, height 5' 3.5" (1.613 m), weight 64.5 kg (142  lb 3.2 oz), SpO2 95 %.  GEN:  Pleasant patient lying in the stretcher in no acute distress; cooperative with exam. PSYCH:  alert and oriented x4; does not appear anxious or depressed; affect is appropriate. HEENT: Mucous membranes pink and anicteric; PERRLA; EOM intact; no cervical lymphadenopathy nor thyromegaly or carotid bruit; no JVD; There were no stridor. Neck is very supple. Breasts:: Not examined CHEST WALL: No tenderness CHEST: Normal respiration, clear to auscultation bilaterally.  HEART: Regular rate and rhythm.  There are no murmur, rub, or gallops.   BACK: No kyphosis or scoliosis; no CVA tenderness ABDOMEN: soft and non-tender; no masses, no organomegaly, normal abdominal bowel sounds; no pannus; no intertriginous candida. There is no rebound and no distention. Rectal Exam: Not done EXTREMITIES: No bone or joint deformity; age-appropriate arthropathy of the hands and knees; no edema; no ulcerations.  There is no calf tenderness. Genitalia: not examined PULSES: 2+ and symmetric SKIN: Normal hydration no rash or ulceration CNS: Cranial nerves 2-12 grossly intact no focal lateralizing neurologic deficit.  Speech is fluent; uvula elevated with phonation, facial symmetry and tongue midline. DTR are normal bilaterally, cerebella exam is intact, barbinski is negative and strengths are equaled bilaterally.  No sensory loss.   Labs on Admission:  Basic Metabolic Panel:  Recent Labs Lab 07/03/16 1145 07/04/16 0443  NA 135 138  K 4.6 3.7  CL 101 103  CO2 26 31  GLUCOSE 93 98  BUN 21* 18  CREATININE 0.78 0.85  CALCIUM 8.9 7.9*  MG 1.9  --    Liver Function Tests:  Recent Labs Lab 07/04/16 0443  AST 33  ALT 32  ALKPHOS 93  BILITOT 0.8  PROT 5.5*  ALBUMIN 3.1*   CBC:  Recent Labs Lab 07/03/16 1145 07/04/16 0443  WBC 8.0 6.9  HGB 14.3 12.9  HCT 44.0 40.4  MCV 94.0 94.4  PLT 221 196   Cardiac Enzymes:  Recent Labs Lab 07/03/16 1145 07/03/16 1612  07/03/16 1738 07/03/16 2228 07/04/16 0443  TROPONINI 0.20* 0.21* 0.18* 0.20* 0.18*    CBG: No results for input(s): GLUCAP in the last 168 hours.   Radiological Exams on Admission: Dg Chest 2 View  07/03/2016  CLINICAL DATA:  Shortness of breath for 3 days.  Former smoker. EXAM: CHEST  2 VIEW COMPARISON:  Chest x-ray dated 02/23/2004 FINDINGS: There is cardiomegaly. Atherosclerotic changes noted at the aortic arch. Lungs are hyperexpanded. Coarse interstitial markings are seen bilaterally, most likely chronic interstitial lung disease. More confluent opacities are seen at each lung base, right greater than left, of uncertain acuity. Suspect small pleural effusions. Mild degenerative spurring noted within the kyphotic thoracic spine. No acute or suspicious osseous finding. IMPRESSION: 1. Lungs are hyperexpanded suggesting COPD. Probable associated chronic interstitial lung disease. 2. Confluent opacities at each lung base. These could represent acute pneumonia or chronic confluent scarring/fibrosis. There are new compared to a previous chest x-ray dated 02/23/2004 but there are no recent prior studies available to assess chronicity. 3. Probable small pleural effusions bilaterally. 4. Aortic atherosclerosis. Electronically Signed   By: Franki Cabot M.D.   On: 07/03/2016 12:16   Ct Angio Chest Pe  W/cm &/or Wo Cm  07/03/2016  CLINICAL DATA:  Shortness of breath for 3 days. EXAM: CT ANGIOGRAPHY CHEST WITH CONTRAST TECHNIQUE: Multidetector CT imaging of the chest was performed using the standard protocol during bolus administration of intravenous contrast. Multiplanar CT image reconstructions and MIPs were obtained to evaluate the vascular anatomy. CONTRAST:  100 cc Isovue 370 COMPARISON:  Chest x-ray from earlier same day. FINDINGS: Mediastinum/Lymph Nodes: Some of the most peripheral segmental and subsegmental pulmonary artery branches to the lower lobes are difficult to definitively characterize due to  patient breathing motion artifact but there is a single acute-appearing pulmonary embolus identified at the junction of the segmental pulmonary artery branches to the posterior and lateral segments of the right lower lobe. No other pulmonary emboli identified, with limitations detailed above. Scattered atherosclerotic changes seen along the walls of the normal-caliber thoracic aorta. No aneurysm or dissection seen. Heart is enlarged. No pericardial effusion. Coronary artery calcifications noted. No mass or enlarged lymph nodes seen within the mediastinum or perihilar regions. Trachea and central bronchi are unremarkable. Lungs/Pleura: Bilateral moderate-sized pleural effusions, right slightly greater than left. Associated interstitial edema at the lung bases. Scarring/fibrosis and emphysematous changes noted within the upper lobes. No evidence of pneumonia seen. No pneumothorax. Upper abdomen: Limited images of the upper abdomen are unremarkable. Musculoskeletal: Mild degenerative spurring noted within the slightly scoliotic thoracic spine. Mild compression deformity of an upper thoracic vertebral body appears chronic. No acute or suspicious osseous finding. Superficial soft tissues are unremarkable. Review of the MIP images confirms the above findings. IMPRESSION: 1. Single acute-appearing pulmonary embolus identified at the junction of the segmental pulmonary artery branches to the posterior and lateral segments of the right lower lobe. No other pulmonary emboli identified, with mild study limitations detailed above. 2. Bilateral moderate-sized pleural effusions, right slightly greater than left. Associated interstitial edema and atelectasis at the right lung base. No evidence of pneumonia seen. 3. Scarring/fibrosis and emphysematous changes within the upper lobes bilaterally, mild to moderate in degree. 4. Aortic atherosclerosis. 5. Cardiomegaly. Coronary artery calcifications, particularly dense within the left  anterior descending and circumflex coronary arteries. Recommend correlation with any possible associated cardiac symptoms. These results were called by telephone at the time of interpretation on 07/03/2016 at 3:03 pm to Dr. Varney Biles , who verbally acknowledged these results. Electronically Signed   By: Franki Cabot M.D.   On: 07/03/2016 15:04   Assessment/Plan Present on Admission:  . Acute pulmonary embolism ([place]) . Tachycardia . Pulmonary embolism ([facility])  PLAN:  Acute PE:  Stable hemodynamics.  Continue with IV Heparin.  I suspect she would benefit taking Eliquis or another NOAC.  Will start her on Eliquis today.   Elevated troponins:  It is likely from her PE, and is a prognostic indicator for acute PE.  Fortunately, it is not significantly elevated.   Tachycardia:  From her acute PE as well.  Lopressor started.  Not sure its beneficial, but will defer to cardiology.    Other plans as per orders. Code Status: FULL Haskel Khan, MD.  FACP [facility] Pager [phone] 7pm to 7am.  07/04/2016, 9:39 AM

## 2016-07-04 NOTE — Care Management Note (Signed)
Case Management Note  Patient Details  Name: TIZIANA GAROFANO MRN: [id number] Date of Birth: August 22, 1924  Subjective/Objective:                  Pt admitted with PE. Pt is from home, lives with her spouse and is ind with ADL's. Pt drives to appointments, has PCP and has no difficulty affording medications. Pt plans to return home with self care. Pt currently requiring supplemental O2. Pt with no chronic resp failure and will not qualify for O2 at DC. Pt will be started on Eliquis, voucher given by pharmacy. Benefits check completed and pt's co-pay will be $40/30 day supply. Pt able to afford this. Pt plans to return home with self care at DC.   Action/Plan: No CM needs anticipated.   Expected Discharge Date:    07/06/2016              Expected Discharge Plan:  Home/Self Care  In-House Referral:  NA  Discharge planning Services     Post Acute Care Choice:  NA Choice offered to:  NA  DME Arranged:    DME Agency:     HH Arranged:    HH Agency:     Status of Service:  Completed, signed off  If discussed at [employer] of Stay Meetings, dates discussed:    Additional Comments:  Sherald Barge, RN 07/04/2016, 1:54 PM

## 2016-07-04 NOTE — Progress Notes (Signed)
ANTICOAGULATION CONSULT NOTE - Initial Consult  Pharmacy Consult for Heparin Indication: pulmonary embolus  Allergies  Allergen Reactions  . Codeine Nausea Only  . Penicillins Other (See Comments)    "Not allergic just a lot of my family members are allergic". Has patient had a PCN reaction causing immediate rash, facial/tongue/throat swelling, SOB or lightheadedness with hypotension: No Has patient had a PCN reaction causing severe rash involving mucus membranes or skin necrosis: No Has patient had a PCN reaction that required hospitalization No Has patient had a PCN reaction occurring within the last 10 years: No If all of the above answers are "NO", then may proceed with Cephalosporin use.     Patient Measurements: Height: 5' 3.5" (161.3 cm) Weight: 142 lb 3.2 oz (64.5 kg) IBW/kg (Calculated) : 53.55 Heparin Dosing Weight: 64.9 kg  Vital Signs: Temp: 97.3 F (36.3 C) (07/10 2324) Temp Source: Oral (07/10 2324) BP: 112/86 mmHg (07/11 0600) Pulse Rate: 116 (07/11 0600)  Labs:  Recent Labs  07/03/16 1145  07/03/16 1738 07/03/16 2228 07/04/16 0443  HGB 14.3  --   --   --  12.9  HCT 44.0  --   --   --  40.4  PLT 221  --   --   --  196  LABPROT 12.9  --   --   --  15.4*  INR 0.95  --   --   --  1.20  HEPARINUNFRC  --   --   --  0.62 0.56  CREATININE 0.78  --   --   --  0.85  TROPONINI 0.20*  < > 0.18* 0.20* 0.18*  < > = values in this interval not displayed.  Estimated Creatinine Clearance: 41.1 mL/min (by C-G formula based on Cr of 0.85).   Medical History: Past Medical History  Diagnosis Date  . Osteoporosis   . Cataracts, bilateral   . Macular degeneration     Medications:  Scheduled:  . antiseptic oral rinse  7 mL Mouth Rinse BID  . atorvastatin  80 mg Oral q1800  . metoprolol tartrate  25 mg Oral BID  . sodium chloride flush  3 mL Intravenous Q12H    Assessment: 82 y.o. female who presents to the Emergency Department complaining of intermittent  episodes of shortness of breath x 3 days. Pharmacy consult for heparin for PE HL =0.56, therapeutic.  Goal of Therapy:  Heparin level 0.3-0.7 units/ml Monitor platelets by anticoagulation protocol: Yes   Plan:  Continue heparin infusion at 1000 units/hr Check daily while on heparin Continue to monitor H&H and platelets  Isac Sarna, BS Vena [place], BCPS Clinical Pharmacist Pager [phone] 07/04/2016,7:44 AM

## 2016-07-04 NOTE — Progress Notes (Signed)
SBP this AM between 80-90 mg/dl. Okay to give ordered lopressor dose per HCG NP Miranda Ford. Post administration SBP noted to be 70-80 mg/dl, most recent 77/53. Dr. Domenic Ford on unit, made aware. Additionally, hospitalist made aware, new orders received. Will continue to monitor accordingly.

## 2016-07-04 NOTE — Progress Notes (Signed)
Consulting Cardiologist: Dr. Dorris Carnes  Cardiology Specific Problem List: 1. Atrial tachycardia 2. Demand Ischemia  Subjective:    Denies chest pain, breathing improved. Does not feel palpitations. Up in room with assistance.  Objective:   Temp:  [94.6 F (34.8 C)-97.7 F (36.5 C)] 94.6 F (34.8 C) (07/11 0751) Pulse Rate:  [56-141] 122 (07/11 0900) Resp:  [14-30] 27 (07/11 0900) BP: (88-134)/(56-96) 91/74 mmHg (07/11 0908) SpO2:  [88 %-97 %] 95 % (07/11 0900) Weight:  [142 lb 3.2 oz (64.5 kg)-143 lb (64.864 kg)] 142 lb 3.2 oz (64.5 kg) (07/10 1719) Last BM Date: 07/03/16  Filed Weights   07/03/16 1132 07/03/16 1719  Weight: 143 lb (64.864 kg) 142 lb 3.2 oz (64.5 kg)    Intake/Output Summary (Last 24 hours) at 07/04/16 0933 Last data filed at 07/04/16 0300  Gross per 24 hour  Intake 803.75 ml  Output   1050 ml  Net -246.25 ml    Telemetry: SR with bursts of atrial tachycardia.  Exam:  General: Elderly woman, no acute distress.  Lungs: Clear to auscultation, nonlabored. No pain with inspiration.  Cardiac: No elevated JVP or bruits. RRR, tachycardia, no gallop or rub.   Abdomen: Normoactive bowel sounds, nontender, nondistended.  Extremities: No pitting edema, distal pulses full. Multiple varicosities bilaterally.   Neuropsychiatric: Alert and oriented x3, affect appropriate.   Lab Results:  Basic Metabolic Panel:  Recent Labs Lab 07/03/16 1145 07/04/16 0443  NA 135 138  K 4.6 3.7  CL 101 103  CO2 26 31  GLUCOSE 93 98  BUN 21* 18  CREATININE 0.78 0.85  CALCIUM 8.9 7.9*  MG 1.9  --     Liver Function Tests:  Recent Labs Lab 07/04/16 0443  AST 33  ALT 32  ALKPHOS 93  BILITOT 0.8  PROT 5.5*  ALBUMIN 3.1*    CBC:  Recent Labs Lab 07/03/16 1145 07/04/16 0443  WBC 8.0 6.9  HGB 14.3 12.9  HCT 44.0 40.4  MCV 94.0 94.4  PLT 221 196    Cardiac Enzymes:  Recent Labs Lab 07/03/16 1738 07/03/16 2228 07/04/16 0443   TROPONINI 0.18* 0.20* 0.18*    Coagulation:  Recent Labs Lab 07/03/16 1145 07/04/16 0443  INR 0.95 1.20    Radiology: Dg Chest 2 View  07/03/2016  CLINICAL DATA:  Shortness of breath for 3 days.  Former smoker. EXAM: CHEST  2 VIEW COMPARISON:  Chest x-ray dated 02/23/2004 FINDINGS: There is cardiomegaly. Atherosclerotic changes noted at the aortic arch. Lungs are hyperexpanded. Coarse interstitial markings are seen bilaterally, most likely chronic interstitial lung disease. More confluent opacities are seen at each lung base, right greater than left, of uncertain acuity. Suspect small pleural effusions. Mild degenerative spurring noted within the kyphotic thoracic spine. No acute or suspicious osseous finding. IMPRESSION: 1. Lungs are hyperexpanded suggesting COPD. Probable associated chronic interstitial lung disease. 2. Confluent opacities at each lung base. These could represent acute pneumonia or chronic confluent scarring/fibrosis. There are new compared to a previous chest x-ray dated 02/23/2004 but there are no recent prior studies available to assess chronicity. 3. Probable small pleural effusions bilaterally. 4. Aortic atherosclerosis. Electronically Signed   By: Franki Cabot M.D.   On: 07/03/2016 12:16   Ct Angio Chest Pe W/cm &/or Wo Cm  07/03/2016  CLINICAL DATA:  Shortness of breath for 3 days. EXAM: CT ANGIOGRAPHY CHEST WITH CONTRAST TECHNIQUE: Multidetector CT imaging of the chest was performed using the standard protocol during bolus administration  of intravenous contrast. Multiplanar CT image reconstructions and MIPs were obtained to evaluate the vascular anatomy. CONTRAST:  100 cc Isovue 370 COMPARISON:  Chest x-ray from earlier same day. FINDINGS: Mediastinum/Lymph Nodes: Some of the most peripheral segmental and subsegmental pulmonary artery branches to the lower lobes are difficult to definitively characterize due to patient breathing motion artifact but there is a single  acute-appearing pulmonary embolus identified at the junction of the segmental pulmonary artery branches to the posterior and lateral segments of the right lower lobe. No other pulmonary emboli identified, with limitations detailed above. Scattered atherosclerotic changes seen along the walls of the normal-caliber thoracic aorta. No aneurysm or dissection seen. Heart is enlarged. No pericardial effusion. Coronary artery calcifications noted. No mass or enlarged lymph nodes seen within the mediastinum or perihilar regions. Trachea and central bronchi are unremarkable. Lungs/Pleura: Bilateral moderate-sized pleural effusions, right slightly greater than left. Associated interstitial edema at the lung bases. Scarring/fibrosis and emphysematous changes noted within the upper lobes. No evidence of pneumonia seen. No pneumothorax. Upper abdomen: Limited images of the upper abdomen are unremarkable. Musculoskeletal: Mild degenerative spurring noted within the slightly scoliotic thoracic spine. Mild compression deformity of an upper thoracic vertebral body appears chronic. No acute or suspicious osseous finding. Superficial soft tissues are unremarkable. Review of the MIP images confirms the above findings. IMPRESSION: 1. Single acute-appearing pulmonary embolus identified at the junction of the segmental pulmonary artery branches to the posterior and lateral segments of the right lower lobe. No other pulmonary emboli identified, with mild study limitations detailed above. 2. Bilateral moderate-sized pleural effusions, right slightly greater than left. Associated interstitial edema and atelectasis at the right lung base. No evidence of pneumonia seen. 3. Scarring/fibrosis and emphysematous changes within the upper lobes bilaterally, mild to moderate in degree. 4. Aortic atherosclerosis. 5. Cardiomegaly. Coronary artery calcifications, particularly dense within the left anterior descending and circumflex coronary arteries.  Recommend correlation with any possible associated cardiac symptoms. These results were called by telephone at the time of interpretation on 07/03/2016 at 3:03 pm to Dr. Varney Biles , who verbally acknowledged these results. Electronically Signed   By: Franki Cabot M.D.   On: 07/03/2016 15:04    Medications:   Scheduled Medications: . antiseptic oral rinse  7 mL Mouth Rinse BID  . atorvastatin  80 mg Oral q1800  . metoprolol tartrate  25 mg Oral BID  . sodium chloride flush  3 mL Intravenous Q12H    Infusions: . heparin 1,000 Units/hr (07/04/16 0300)    PRN Medications: acetaminophen **OR** acetaminophen, metoprolol, polyvinyl alcohol   Assessment and Plan:   1. Paroxysmal atrial tachycardia: Has been placed on metoprolol 25 mg BID yesterday with some improvement, but continues to have frequent and persistent episodes of atrial tachycardia. Likely related to PE. BP is soft and therefore will not up-titrate metoprolol at this time. Will use Amiodarone temporarily to help stabilize rhythm. Echocardiogram is pending for assessment of LV and RV function.   2. Pulmonary embolus: Continues on heparin with adjustments per Pharmacy. Management +/- consideration of hypercoagulability work-up per primary team. Might consider DOAC instead of Coumadin.  3. Question of CHF at presentation:  Has diuresed 1 liter since admission. No edema noted but multiple varicosities of LE. Echocardiogram pending. Bilateral pleural effusions noted by chest imaging including CT.  4. Elevated troponin I: Patient does have coronary artery calcifications by chest CT imaging. This most likely reflects demand ischemia in the setting of pulmonary embolus rather than true ACS  however.  Phill Myron. Lawrence NP [place]  07/04/2016, 9:33 AM   Attending note:  Patient seen and examined. Reviewed chart and consult note from Dr. Harrington Challenger. Modified above note by Ms. Lawrence NP with additions to the A/P. Patient currently admitted  with pulmonary embolus, also associated concerns with paroxysmal atrial tachycardia and possibly associated heart failure. Patient has evidence of coronary artery calcifications by chest CT imaging. She is clinically stable this morning, no active chest pain or palpitations. Continues to have frequent episodes of atrial tachycardia with persistence. Heart rate up in the 120 to 130 range. Systolic blood pressure recently in the 90-110 range. On examination she appears comfortable, lungs are clear without labored breathing. Cardiac exam reveals distant regular heart sounds without gallop. Lab work shows mild troponin I elevation up to 0.21 in flat pattern most consistent with demand ischemia rather than ACS. Management of pulmonary embolus is per primary team, currently on heparin followed by pharmacy. Transition to Coumadin was mentioned in the H&P, may want to consider a DOAC. We will follow-up on the echocardiogram that has been scheduled for assessment of LV and RV function. Given frequency of atrial tachycardia in the face of beta blocker therapy with low-normal blood pressures, will initiate amiodarone temporarily to try and better stabilize rhythm in the short-term.  Satira Sark, M.D., F.A.C.C.

## 2016-07-04 NOTE — Progress Notes (Signed)
*  PRELIMINARY RESULTS* Echocardiogram 2D Echocardiogram has been performed.  Miranda Ford 07/04/2016, 11:55 AM

## 2016-07-04 NOTE — Progress Notes (Signed)
ANTICOAGULATION CONSULT NOTE - Initial Consult  Pharmacy Consult for Eliquis(apixaban) Indication: pulmonary embolus  Allergies  Allergen Reactions  . Codeine Nausea Only  . Penicillins Other (See Comments)    "Not allergic just a lot of my family members are allergic". Has patient had a PCN reaction causing immediate rash, facial/tongue/throat swelling, SOB or lightheadedness with hypotension: No Has patient had a PCN reaction causing severe rash involving mucus membranes or skin necrosis: No Has patient had a PCN reaction that required hospitalization No Has patient had a PCN reaction occurring within the last 10 years: No If all of the above answers are "NO", then may proceed with Cephalosporin use.     Patient Measurements: Height: 5' 3.5" (161.3 cm) Weight: 142 lb 3.2 oz (64.5 kg) IBW/kg (Calculated) : 53.55 Heparin Dosing Weight: 64.9 kg  Vital Signs: Temp: 94.6 F (34.8 C) (07/11 0751) Temp Source: Oral (07/11 0751) BP: 91/74 mmHg (07/11 0908) Pulse Rate: 122 (07/11 0900)  Labs:  Recent Labs  07/03/16 1145  07/03/16 1738 07/03/16 2228 07/04/16 0443  HGB 14.3  --   --   --  12.9  HCT 44.0  --   --   --  40.4  PLT 221  --   --   --  196  LABPROT 12.9  --   --   --  15.4*  INR 0.95  --   --   --  1.20  HEPARINUNFRC  --   --   --  0.62 0.56  CREATININE 0.78  --   --   --  0.85  TROPONINI 0.20*  < > 0.18* 0.20* 0.18*  < > = values in this interval not displayed.  Estimated Creatinine Clearance: 41.1 mL/min (by C-G formula based on Cr of 0.85).   Medical History: Past Medical History  Diagnosis Date  . Osteoporosis   . Cataracts, bilateral   . Macular degeneration     Medications:  Scheduled:  . amiodarone  200 mg Oral BID  . antiseptic oral rinse  7 mL Mouth Rinse BID  . apixaban  10 mg Oral BID   Followed by  . [START ON 07/11/2016] apixaban  5 mg Oral BID  . atorvastatin  80 mg Oral q1800  . metoprolol tartrate  25 mg Oral BID  . sodium chloride  flush  3 mL Intravenous Q12H    Assessment: 82 y.o. female who presents to the Emergency Department complaining of intermittent episodes of shortness of breath x 3 days. Pharmacy consult for PE, transitioning treatment from heparin to eliquis.  Goal of Therapy:  Heparin level 0.3-0.7 units/ml Monitor platelets by anticoagulation protocol: Yes   Plan:  Eliquis (apixaban) 10mg  po BID for 7 days, then 5mg  po BID Continue to monitor H&H and platelets  Isac Sarna, BS [place], BCPS Clinical Pharmacist Pager [phone] 07/04/2016,10:20 AM

## 2016-07-05 DIAGNOSIS — J9601 Acute respiratory failure with hypoxia: Secondary | ICD-10-CM | POA: Diagnosis present

## 2016-07-05 DIAGNOSIS — I2609 Other pulmonary embolism with acute cor pulmonale: Secondary | ICD-10-CM

## 2016-07-05 DIAGNOSIS — I429 Cardiomyopathy, unspecified: Secondary | ICD-10-CM

## 2016-07-05 DIAGNOSIS — I5021 Acute systolic (congestive) heart failure: Secondary | ICD-10-CM | POA: Diagnosis present

## 2016-07-05 DIAGNOSIS — I5082 Biventricular heart failure: Secondary | ICD-10-CM | POA: Insufficient documentation

## 2016-07-05 DIAGNOSIS — I509 Heart failure, unspecified: Secondary | ICD-10-CM | POA: Insufficient documentation

## 2016-07-05 DIAGNOSIS — I471 Supraventricular tachycardia: Secondary | ICD-10-CM | POA: Insufficient documentation

## 2016-07-05 LAB — APTT
APTT: 37 s (ref 24–37)
APTT: 40 s — AB (ref 24–37)

## 2016-07-05 LAB — CBC
HEMATOCRIT: 43.8 % (ref 36.0–46.0)
Hemoglobin: 14.2 g/dL (ref 12.0–15.0)
MCH: 31 pg (ref 26.0–34.0)
MCHC: 32.4 g/dL (ref 30.0–36.0)
MCV: 95.6 fL (ref 78.0–100.0)
PLATELETS: 239 10*3/uL (ref 150–400)
RBC: 4.58 MIL/uL (ref 3.87–5.11)
RDW: 14.5 % (ref 11.5–15.5)
WBC: 12.3 10*3/uL — AB (ref 4.0–10.5)

## 2016-07-05 LAB — HEPARIN LEVEL (UNFRACTIONATED): Heparin Unfractionated: >2.2 IU/mL — ABNORMAL HIGH (ref 0.30–0.70)

## 2016-07-05 MED ORDER — FUROSEMIDE 10 MG/ML IJ SOLN
20.0000 mg | Freq: Two times a day (BID) | INTRAMUSCULAR | Status: DC
  Administered 2016-07-05: 20 mg via INTRAVENOUS
  Filled 2016-07-05: qty 2

## 2016-07-05 MED ORDER — PHENYLEPHRINE HCL 1 % NA SOLN
NASAL | Status: AC
  Administered 2016-07-05: 17:00:00
  Filled 2016-07-05: qty 15

## 2016-07-05 MED ORDER — AMIODARONE HCL 200 MG PO TABS
400.0000 mg | ORAL_TABLET | Freq: Two times a day (BID) | ORAL | Status: DC
  Administered 2016-07-05 – 2016-07-10 (×11): 400 mg via ORAL
  Filled 2016-07-05 (×11): qty 2

## 2016-07-05 MED ORDER — SPIRONOLACTONE 25 MG PO TABS
12.5000 mg | ORAL_TABLET | Freq: Every day | ORAL | Status: DC
  Administered 2016-07-05 – 2016-07-15 (×11): 12.5 mg via ORAL
  Filled 2016-07-05 (×11): qty 1

## 2016-07-05 MED ORDER — HEPARIN BOLUS VIA INFUSION
1000.0000 [IU] | Freq: Once | INTRAVENOUS | Status: DC
  Filled 2016-07-05: qty 1000

## 2016-07-05 MED ORDER — HEPARIN (PORCINE) IN NACL 100-0.45 UNIT/ML-% IJ SOLN
1000.0000 [IU]/h | INTRAMUSCULAR | Status: DC
  Administered 2016-07-05: 1000 [IU]/h via INTRAVENOUS
  Filled 2016-07-05: qty 250

## 2016-07-05 MED ORDER — ONDANSETRON HCL 4 MG/2ML IJ SOLN
INTRAMUSCULAR | Status: AC
  Administered 2016-07-05: 17:00:00
  Filled 2016-07-05: qty 2

## 2016-07-05 MED ORDER — LOSARTAN POTASSIUM 25 MG PO TABS
12.5000 mg | ORAL_TABLET | Freq: Every day | ORAL | Status: DC
  Administered 2016-07-05 – 2016-07-15 (×8): 12.5 mg via ORAL
  Filled 2016-07-05 (×13): qty 0.5

## 2016-07-05 MED ORDER — SALINE SPRAY 0.65 % NA SOLN
1.0000 | NASAL | Status: DC | PRN
  Administered 2016-07-05: 1 via NASAL
  Filled 2016-07-05: qty 44

## 2016-07-05 NOTE — Progress Notes (Addendum)
ANTICOAGULATION CONSULT NOTE - follow up  Pharmacy Consult for Heparin Indication: pulmonary embolus  Allergies  Allergen Reactions  . Codeine Nausea Only  . Penicillins Other (See Comments)    "Not allergic just a lot of my family members are allergic". Has patient had a PCN reaction causing immediate rash, facial/tongue/throat swelling, SOB or lightheadedness with hypotension: No Has patient had a PCN reaction causing severe rash involving mucus membranes or skin necrosis: No Has patient had a PCN reaction that required hospitalization No Has patient had a PCN reaction occurring within the last 10 years: No If all of the above answers are "NO", then may proceed with Cephalosporin use.     Patient Measurements: Height: 5' 3.5" (161.3 cm) Weight: 143 lb 11.8 oz (65.2 kg) IBW/kg (Calculated) : 53.55 Heparin Dosing Weight: 64.9 kg  Vital Signs: Temp: 97.7 F (36.5 C) (07/12 2000) Temp Source: Oral (07/12 2000) BP: 127/95 mmHg (07/12 2000) Pulse Rate: 73 (07/12 2000)  Labs:  Recent Labs  07/03/16 1145  07/03/16 1738  07/03/16 2228 07/04/16 0443 07/05/16 1010 07/05/16 1843  HGB 14.3  --   --   --   --  12.9  --  14.2  HCT 44.0  --   --   --   --  40.4  --  43.8  PLT 221  --   --   --   --  196  --  239  APTT  --   --   --   --   --   --  37 40*  LABPROT 12.9  --   --   --   --  15.4*  --   --   INR 0.95  --   --   --   --  1.20  --   --   HEPARINUNFRC  --   --   --   < > 0.62 0.56 >2.20* >2.20*  CREATININE 0.78  --   --   --   --  0.85  --   --   TROPONINI 0.20*  < > 0.18*  --  0.20* 0.18*  --   --   < > = values in this interval not displayed.  Estimated Creatinine Clearance: 41.2 mL/min (by C-G formula based on Cr of 0.85).   Medical History: Past Medical History  Diagnosis Date  . Osteoporosis   . Cataracts, bilateral   . Macular degeneration     Medications:  Scheduled:  . amiodarone  400 mg Oral BID  . antiseptic oral rinse  7 mL Mouth Rinse BID  .  atorvastatin  80 mg Oral q1800  . losartan  12.5 mg Oral Daily  . sodium chloride flush  3 mL Intravenous Q12H  . spironolactone  12.5 mg Oral Daily    Assessment: 82 y.o. female who presents to the Emergency Department complaining of intermittent episodes of shortness of breath x 3 days. Pharmacy consult for heparin for PE and right heart strain. Restart heparin. Pt did receive 2 doses of apixaban yesterday(last dose at 2300 7/11), therefore will draw baseline aPTT and HL. Baseline aPTT and HL do not correlate. APTT is 40s and subtherapeutic. Patient had nose bleed and heparin is on hold.  Goal of Therapy:  APTT 66-102 Heparin level 0.3-0.7 units/ml Monitor platelets by anticoagulation protocol: Yes   Plan:  F/u when to restart heparin Check aPTT and heparin level in 8 hours  Continue to monitor H&H and platelets  Isac Sarna, BS Pharm  D, BCPS Clinical Pharmacist Pager [phone] 07/05/2016,9:27 PM

## 2016-07-05 NOTE — Progress Notes (Signed)
Pt brought down to ED per DR Lacinda Axon from ICU to perform nasal packing at 1645. ICU RN and ED RN at bedside.  Afrin 2 sprays to right nostril given by [place] at 1653 Pt tolerated well.  Dr. Lacinda Axon inserted 5.5cm anterior rapid rhino to right nostril without difficultly at 1658 pt tolerated well.  Pt alert/oreinted.  Zofran 4mg  iv given per vo dr cook for nausea. Given to iv at right post fa at [phone]

## 2016-07-05 NOTE — Progress Notes (Signed)
Paged Dr Roderic Palau to notify pt back to ICU.  Pt tolerated procedure well.  Per Dr Roderic Palau, will not restart Heparin drip at this time.  CBC lab ordered.  Will continue to monitor.

## 2016-07-05 NOTE — Progress Notes (Signed)
PROGRESS NOTE    Miranda Ford  [id number] DOB: 04/16/1924 DOA: 07/03/2016 PCP: Purvis Kilts, MD   Outpatient Specialists:   Brief Narrative:  17 yof presented with SOB found to be secondary to acute PE. On admission she was started on IV heparin, will likely need a few more days of IV therapy prior to transition to oral therapy. She was also noted to have atrial tachycardia started on Lopressor with some improvement. She has also been started on Amiodarone. Cardiology is following. ECHO with EF of 20%.  Assessment & Plan: Active Problems:   Acute pulmonary embolism (HCC)   Tachycardia   Pulmonary embolism (HCC)   Cardiomegaly   Coronary atherosclerosis of native coronary artery   Paroxysmal atrial tachycardia ([place])   Demand ischemia ([place])   1. Acute PE with evidence of right heart strain. Initially started on IV Heparin. She remains short of breath. Due to evidence of right heart strain, she will benefit from continued IV anticoagulation. Will restart IV heparin. 2. Acute respiratory failure with hypoxia. Contiue to wean oxygen as tolerated.  3. Elevated troponins: Likely demand ishemia. No complaints of CP.  4. Paroxysmal atrial tachycardia: Likely secondary to acute PE. Has been started on Lopressor and Amiodarone. Cardiology following and ECHO results as below.  5. Acute systolic CHF: EF of 123456. Bilateral pleural effusions noted by chest imaging including CT. Was noted to diuresis 1L on admission. Will start on low-dose lasix. She is already on beta blocker. If blood pressure tolerates will consider adding ACE-I. 6. Coronary atherosclerosis of native coronary artery. No complaints of chest pain. ECHO does show wall motion abnormalities and depressed EF. Further management per Cardiology   DVT prophylaxis: Heparin  Code Status: Full Family Communication: No family bedside.  Disposition Plan: Discharged once improved    Consultants:   Cardiology    Procedures:  ECHO  - Left ventricle: The cavity size was mildly dilated. Wall  thickness was increased in a pattern of mild LVH. The estimated  ejection fraction was 20%. Diffuse hypokinesis. There is akinesis  of the lateral, inferolateral, and inferior myocardium. Doppler  parameters are consistent with restrictive physiology, indicative  of decreased left ventricular diastolic compliance and/or  increased left atrial pressure. - Aortic valve: Mildly calcified annulus. Trileaflet; moderately  thickened, moderately calcified leaflets. There was mild  regurgitation. Mean gradient (S): 5 mm Hg. VTI ratio of LVOT to  aortic valve: 0.35. Valve area (VTI): 1.22 cm^2. Valve area  (Vmax): 1.43 cm^2. Suspect pseudo-stenosis in the setting of  significantly reduced LVEF. - Mitral valve: Calcified annulus. Mildly calcified leaflets .  There was mild regurgitation. - Left atrium: The atrium was mildly dilated. - Right ventricle: Systolic function was moderately to severely  reduced. - Right atrium: Central venous pressure (est): 3 mm Hg. - Atrial septum: No defect or patent foramen ovale was identified. - Tricuspid valve: There was trivial regurgitation. - Pulmonary arteries: PA peak pressure: 20 mm Hg (S). - Pericardium, extracardiac: There was a left pleural effusion.  Antimicrobials:   None   Subjective: Feeling better still has moderate shortness of breath. Denies coughing, chest pain or wheezing. Unable to sleep due to difficulty breathing.   Objective: Filed Vitals:   07/05/16 0300 07/05/16 0400 07/05/16 0500 07/05/16 0600  BP: 115/90 113/83 103/80 113/83  Pulse: 95 118 115 118  Temp:  97.3 F (36.3 C)    TempSrc:  Oral    Resp: 21 19 16 19   Height:  Weight:  65.2 kg (143 lb 11.8 oz)    SpO2: 94% 95% 93% 95%    Intake/Output Summary (Last 24 hours) at 07/05/16 0717 Last data filed at 07/05/16 0300  Gross per 24 hour  Intake 878.66 ml  Output     550 ml  Net 328.66 ml   Filed Weights   07/03/16 1132 07/03/16 1719 07/05/16 0400  Weight: 64.864 kg (143 lb) 64.5 kg (142 lb 3.2 oz) 65.2 kg (143 lb 11.8 oz)    Examination:  General exam: Appears calm and comfortable  Respiratory system: Respiratory effort normal. Crackles at the bases. Able to speak in full sentences.  Cardiovascular system: S1 & S2 heard, RRR. No JVD, murmurs, rubs, gallops or clicks. Trace bilateral pedal edema. Gastrointestinal system: Abdomen is nondistended, soft and nontender. No organomegaly or masses felt. Normal bowel sounds heard. Central nervous system: Alert and oriented. No focal neurological deficits. Extremities: Symmetric 5 x 5 power. Skin: No rashes, lesions or ulcers Psychiatry: Judgement and insight appear normal. Mood & affect appropriate.     Data Reviewed: I have personally reviewed following labs and imaging studies  CBC:  Recent Labs Lab 07/03/16 1145 07/04/16 0443  WBC 8.0 6.9  HGB 14.3 12.9  HCT 44.0 40.4  MCV 94.0 94.4  PLT 221 123456   Basic Metabolic Panel:  Recent Labs Lab 07/03/16 1145 07/04/16 0443  NA 135 138  K 4.6 3.7  CL 101 103  CO2 26 31  GLUCOSE 93 98  BUN 21* 18  CREATININE 0.78 0.85  CALCIUM 8.9 7.9*  MG 1.9  --    GFR: Estimated Creatinine Clearance: 41.2 mL/min (by C-G formula based on Cr of 0.85). Liver Function Tests:  Recent Labs Lab 07/04/16 0443  AST 33  ALT 32  ALKPHOS 93  BILITOT 0.8  PROT 5.5*  ALBUMIN 3.1*   No results for input(s): LIPASE, AMYLASE in the last 168 hours. No results for input(s): AMMONIA in the last 168 hours. Coagulation Profile:  Recent Labs Lab 07/03/16 1145 07/04/16 0443  INR 0.95 1.20   Cardiac Enzymes:  Recent Labs Lab 07/03/16 1145 07/03/16 1612 07/03/16 1738 07/03/16 2228 07/04/16 0443  TROPONINI 0.20* 0.21* 0.18* 0.20* 0.18*   BNP (last 3 results) No results for input(s): PROBNP in the last 8760 hours. HbA1C: No results for input(s):  HGBA1C in the last 72 hours. CBG: No results for input(s): GLUCAP in the last 168 hours. Lipid Profile:  Recent Labs  07/04/16 0443  CHOL 141  HDL 56  LDLCALC 77  TRIG 41  CHOLHDL 2.5   Thyroid Function Tests:  Recent Labs  07/03/16 1612  TSH 2.258   Anemia Panel: No results for input(s): VITAMINB12, FOLATE, FERRITIN, TIBC, IRON, RETICCTPCT in the last 72 hours. Urine analysis:    Component Value Date/Time   COLORURINE YELLOW 07/03/2016 1557   APPEARANCEUR CLEAR 07/03/2016 1557   LABSPEC 1.010 07/03/2016 1557   PHURINE 5.5 07/03/2016 1557   GLUCOSEU NEGATIVE 07/03/2016 1557   HGBUR NEGATIVE 07/03/2016 1557   BILIRUBINUR NEGATIVE 07/03/2016 1557   KETONESUR NEGATIVE 07/03/2016 1557   PROTEINUR NEGATIVE 07/03/2016 1557   NITRITE NEGATIVE 07/03/2016 1557   LEUKOCYTESUR NEGATIVE 07/03/2016 1557   Sepsis Labs: @LABRCNTIP (procalcitonin:4,lacticidven:4)  ) Recent Results (from the past 240 hour(s))  MRSA PCR Screening     Status: None   Collection Time: 07/03/16  5:20 PM  Result Value Ref Range Status   MRSA by PCR NEGATIVE NEGATIVE Final    Comment:  The GeneXpert MRSA Assay (FDA approved for NASAL specimens only), is one component of a comprehensive MRSA colonization surveillance program. It is not intended to diagnose MRSA infection nor to guide or monitor treatment for MRSA infections.          Radiology Studies: Dg Chest 2 View  07/03/2016  CLINICAL DATA:  Shortness of breath for 3 days.  Former smoker. EXAM: CHEST  2 VIEW COMPARISON:  Chest x-ray dated 02/23/2004 FINDINGS: There is cardiomegaly. Atherosclerotic changes noted at the aortic arch. Lungs are hyperexpanded. Coarse interstitial markings are seen bilaterally, most likely chronic interstitial lung disease. More confluent opacities are seen at each lung base, right greater than left, of uncertain acuity. Suspect small pleural effusions. Mild degenerative spurring noted within the kyphotic  thoracic spine. No acute or suspicious osseous finding. IMPRESSION: 1. Lungs are hyperexpanded suggesting COPD. Probable associated chronic interstitial lung disease. 2. Confluent opacities at each lung base. These could represent acute pneumonia or chronic confluent scarring/fibrosis. There are new compared to a previous chest x-ray dated 02/23/2004 but there are no recent prior studies available to assess chronicity. 3. Probable small pleural effusions bilaterally. 4. Aortic atherosclerosis. Electronically Signed   By: Franki Cabot M.D.   On: 07/03/2016 12:16   Ct Angio Chest Pe W/cm &/or Wo Cm  07/03/2016  CLINICAL DATA:  Shortness of breath for 3 days. EXAM: CT ANGIOGRAPHY CHEST WITH CONTRAST TECHNIQUE: Multidetector CT imaging of the chest was performed using the standard protocol during bolus administration of intravenous contrast. Multiplanar CT image reconstructions and MIPs were obtained to evaluate the vascular anatomy. CONTRAST:  100 cc Isovue 370 COMPARISON:  Chest x-ray from earlier same day. FINDINGS: Mediastinum/Lymph Nodes: Some of the most peripheral segmental and subsegmental pulmonary artery branches to the lower lobes are difficult to definitively characterize due to patient breathing motion artifact but there is a single acute-appearing pulmonary embolus identified at the junction of the segmental pulmonary artery branches to the posterior and lateral segments of the right lower lobe. No other pulmonary emboli identified, with limitations detailed above. Scattered atherosclerotic changes seen along the walls of the normal-caliber thoracic aorta. No aneurysm or dissection seen. Heart is enlarged. No pericardial effusion. Coronary artery calcifications noted. No mass or enlarged lymph nodes seen within the mediastinum or perihilar regions. Trachea and central bronchi are unremarkable. Lungs/Pleura: Bilateral moderate-sized pleural effusions, right slightly greater than left. Associated  interstitial edema at the lung bases. Scarring/fibrosis and emphysematous changes noted within the upper lobes. No evidence of pneumonia seen. No pneumothorax. Upper abdomen: Limited images of the upper abdomen are unremarkable. Musculoskeletal: Mild degenerative spurring noted within the slightly scoliotic thoracic spine. Mild compression deformity of an upper thoracic vertebral body appears chronic. No acute or suspicious osseous finding. Superficial soft tissues are unremarkable. Review of the MIP images confirms the above findings. IMPRESSION: 1. Single acute-appearing pulmonary embolus identified at the junction of the segmental pulmonary artery branches to the posterior and lateral segments of the right lower lobe. No other pulmonary emboli identified, with mild study limitations detailed above. 2. Bilateral moderate-sized pleural effusions, right slightly greater than left. Associated interstitial edema and atelectasis at the right lung base. No evidence of pneumonia seen. 3. Scarring/fibrosis and emphysematous changes within the upper lobes bilaterally, mild to moderate in degree. 4. Aortic atherosclerosis. 5. Cardiomegaly. Coronary artery calcifications, particularly dense within the left anterior descending and circumflex coronary arteries. Recommend correlation with any possible associated cardiac symptoms. These results were called by telephone at the  time of interpretation on 07/03/2016 at 3:03 pm to Dr. Varney Biles , who verbally acknowledged these results. Electronically Signed   By: Franki Cabot M.D.   On: 07/03/2016 15:04        Scheduled Meds: . amiodarone  200 mg Oral BID  . antiseptic oral rinse  7 mL Mouth Rinse BID  . apixaban  10 mg Oral BID   Followed by  . [START ON 07/11/2016] apixaban  5 mg Oral BID  . atorvastatin  80 mg Oral q1800  . sodium chloride flush  3 mL Intravenous Q12H   Continuous Infusions: . dextrose 5 % and 0.9% NaCl 50 mL/hr at 07/05/16 0300     LOS: 2  days    Time spent: 25 minutes   Kathie Dike, MD [facility] If 7PM-7AM, please contact night-coverage [email or web address] Password [email or web address] 07/05/2016, 7:17 AM     By signing my name below, I, Rennis Harding, attest that this documentation has been prepared under the direction and in the presence of Kathie Dike, MD. Electronically signed: Rennis Harding, Scribe. 07/05/2016 9:20am   I, Dr. Kathie Dike, personally performed the services described in this documentaiton. All medical record entries made by the scribe were at my direction and in my presence. I have reviewed the chart and agree that the record reflects my personal performance and is accurate and complete  Kathie Dike, MD, 07/05/2016 9:36 AM

## 2016-07-05 NOTE — Progress Notes (Signed)
Called to see patient for epistaxis. Patient noted to have significant epistaxis from right nostril and was soaking through multiple gauze. Heparin had been discontinued prior to my arrival. Despite packing with gauze and applying pressure over nasal bridge, patient continued to bleed. Discussed case with Dr. Lacinda Axon in ED regarding placement of rhinorocket. Dr. Lacinda Axon agreed to place rhinorocket if patient could be brought down to ED. Patient was transported down and tolerated procedure well. She has been brought back to ICU. Will repeat CBC. Will hold off on restarting anticoagulation for now. Appreciate assistance of Dr. Deirdre Peer

## 2016-07-05 NOTE — Progress Notes (Signed)
Pt called out, nose had started to bleed.  Pt stated "this happens when I am taking blood thinners".  Dr Roderic Palau notified, order for nasal spray PRN placed, humidification added to O2.    1550- Heparin at 10 ML/hr (1000 units/hr) stopped and Dr Roderic Palau notified of worsening nose bleed.  Nose packed with guaze and suction set up for spitting up of blood.  Per Dr Roderic Palau pt transported to ED for Rapid Rhino per Dr Lacinda Axon.  Pt transported via bed.

## 2016-07-05 NOTE — Progress Notes (Addendum)
Consulting cardiologist: Dr. Dorris Carnes  Seen for followup: Atrial tachycardia, cardiomyopathy  Subjective:    Had some shortness of breath last night. No chest pain this morning. Feels weak. Appetite fair. No hemoptysis.  Objective:   Temp:  [95.6 F (35.3 C)-97.6 F (36.4 C)] 97.5 F (36.4 C) (07/12 0800) Pulse Rate:  [51-136] 119 (07/12 0700) Resp:  [16-27] 22 (07/12 0700) BP: (83-122)/(53-94) 122/80 mmHg (07/12 0700) SpO2:  [89 %-97 %] 94 % (07/12 0700) Weight:  [143 lb 11.8 oz (65.2 kg)] 143 lb 11.8 oz (65.2 kg) (07/12 0400) Last BM Date: 07/04/16  Filed Weights   07/03/16 1132 07/03/16 1719 07/05/16 0400  Weight: 143 lb (64.864 kg) 142 lb 3.2 oz (64.5 kg) 143 lb 11.8 oz (65.2 kg)    Intake/Output Summary (Last 24 hours) at 07/05/16 1053 Last data filed at 07/05/16 1027  Gross per 24 hour  Intake 1118.66 ml  Output   1550 ml  Net -431.34 ml    Telemetry: Sinus rhythm with frequent atrial tachycardia, some sustained.  Exam:  General: Elderly woman in no distress.  Lungs: Decreased breath sounds without wheezing or rhonchi.  Cardiac: Indistinct PMI with rapid RRR.  Abdomen: NABS.  Extremities: No pitting edema.  Lab Results:  Basic Metabolic Panel:  Recent Labs Lab 07/03/16 1145 07/04/16 0443  NA 135 138  K 4.6 3.7  CL 101 103  CO2 26 31  GLUCOSE 93 98  BUN 21* 18  CREATININE 0.78 0.85  CALCIUM 8.9 7.9*  MG 1.9  --     Liver Function Tests:  Recent Labs Lab 07/04/16 0443  AST 33  ALT 32  ALKPHOS 93  BILITOT 0.8  PROT 5.5*  ALBUMIN 3.1*    CBC:  Recent Labs Lab 07/03/16 1145 07/04/16 0443  WBC 8.0 6.9  HGB 14.3 12.9  HCT 44.0 40.4  MCV 94.0 94.4  PLT 221 196    Cardiac Enzymes:  Recent Labs Lab 07/03/16 1738 07/03/16 2228 07/04/16 0443  TROPONINI 0.18* 0.20* 0.18*   Coagulation:  Recent Labs Lab 07/03/16 1145 07/04/16 0443  INR 0.95 1.20    Echocardiogram 07/04/2016: Study Conclusions  - Left  ventricle: The cavity size was mildly dilated. Wall  thickness was increased in a pattern of mild LVH. The estimated  ejection fraction was 20%. Diffuse hypokinesis. There is akinesis  of the lateral, inferolateral, and inferior myocardium. Doppler  parameters are consistent with restrictive physiology, indicative  of decreased left ventricular diastolic compliance and/or  increased left atrial pressure. - Aortic valve: Mildly calcified annulus. Trileaflet; moderately  thickened, moderately calcified leaflets. There was mild  regurgitation. Mean gradient (S): 5 mm Hg. VTI ratio of LVOT to  aortic valve: 0.35. Valve area (VTI): 1.22 cm^2. Valve area  (Vmax): 1.43 cm^2. Suspect pseudo-stenosis in the setting of  significantly reduced LVEF. - Mitral valve: Calcified annulus. Mildly calcified leaflets .  There was mild regurgitation. - Left atrium: The atrium was mildly dilated. - Right ventricle: Systolic function was moderately to severely  reduced. - Right atrium: Central venous pressure (est): 3 mm Hg. - Atrial septum: No defect or patent foramen ovale was identified. - Tricuspid valve: There was trivial regurgitation. - Pulmonary arteries: PA peak pressure: 20 mm Hg (S). - Pericardium, extracardiac: There was a left pleural effusion.  Impressions:  - Mildly dilated LV chamber size with mild LVH and LVEF  approximately 20%. There is severe diffuse hypokinesis with  akinesis of the inferior and inferolateral myocardium.  Probable  restrictive diastolic filling pattern. MAC with calcified mitral  leaflets and mild mitral regurgitation. Mild left atrial  enlargement. Moderately calcified and thickened aortic valve with  reduced cusp excursion and mild aortic regurgitation. Suspect  pseudo-stenosis in the setting of reduced LVEF. Moderately to  severely reduced RV contraction. Trivial tricuspid regurgitation  with PASP estimated 21 mmHg. Left pleural effusion  noted.  Chest CT 07/03/2016: FINDINGS: Mediastinum/Lymph Nodes: Some of the most peripheral segmental and subsegmental pulmonary artery branches to the lower lobes are difficult to definitively characterize due to patient breathing motion artifact but there is a single acute-appearing pulmonary embolus identified at the junction of the segmental pulmonary artery branches to the posterior and lateral segments of the right lower lobe.  No other pulmonary emboli identified, with limitations detailed above.  Scattered atherosclerotic changes seen along the walls of the normal-caliber thoracic aorta. No aneurysm or dissection seen. Heart is enlarged. No pericardial effusion. Coronary artery calcifications noted.  No mass or enlarged lymph nodes seen within the mediastinum or perihilar regions. Trachea and central bronchi are unremarkable.  Lungs/Pleura: Bilateral moderate-sized pleural effusions, right slightly greater than left. Associated interstitial edema at the lung bases. Scarring/fibrosis and emphysematous changes noted within the upper lobes. No evidence of pneumonia seen. No pneumothorax.  Upper abdomen: Limited images of the upper abdomen are unremarkable.  Musculoskeletal: Mild degenerative spurring noted within the slightly scoliotic thoracic spine. Mild compression deformity of an upper thoracic vertebral body appears chronic. No acute or suspicious osseous finding. Superficial soft tissues are unremarkable.  Review of the MIP images confirms the above findings.  IMPRESSION: 1. Single acute-appearing pulmonary embolus identified at the junction of the segmental pulmonary artery branches to the posterior and lateral segments of the right lower lobe. No other pulmonary emboli identified, with mild study limitations detailed above. 2. Bilateral moderate-sized pleural effusions, right slightly greater than left. Associated interstitial edema and atelectasis  at the right lung base. No evidence of pneumonia seen. 3. Scarring/fibrosis and emphysematous changes within the upper lobes bilaterally, mild to moderate in degree. 4. Aortic atherosclerosis. 5. Cardiomegaly. Coronary artery calcifications, particularly dense within the left anterior descending and circumflex coronary arteries. Recommend correlation with any possible associated cardiac symptoms.  Medications:   Scheduled Medications: . amiodarone  200 mg Oral BID  . antiseptic oral rinse  7 mL Mouth Rinse BID  . atorvastatin  80 mg Oral q1800  . furosemide  20 mg Intravenous BID  . sodium chloride flush  3 mL Intravenous Q12H    Infusions: . heparin 1,000 Units/hr (07/05/16 1012)    PRN Medications: acetaminophen **OR** acetaminophen, metoprolol, polyvinyl alcohol   Assessment:   1. Paroxysmal atrial tachycardia. Possibly recent onset in the setting of acute illness, patient reports palpitations as of this past Friday, but not entirely clear in terms of overall duration. With her concurrent new diagnosis of cardiomyopathy, one would at least wonder about the possibility of a tachycardia-mediated cardiomyopathy, but this would generally take a longer time to develop than one week. She has been started on amiodarone.  2. Newly diagnosed cardiomyopathy with biventricular failure, LVEF approximately 20%, severe diffuse hypokinesis with akinesis of the inferior and inferolateral myocardium. Also restrictive diastolic filling pattern. His custom detail with the patient and family today. Duration of this finding is also not clear. She does not have any prior known history of cardiomyopathy or definite acute change in status other than the recent week. She does have coronary artery calcifications by chest CT,  so ischemic etiology is possible, but would favor nonischemic cardiomyopathy at this time.  3. Elevated troponin I in a range most consistent with demand ischemia rather than true  ACS.  4. Acute pulmonary embolus, currently on heparin. This would not explain the degree of current RV dysfunction.   Plan/Discussion:    Situation discussed with patient and family at bedside. Complex patient with generally poor prognosis based on findings. We will try and make medication adjustments aimed at management of her arrhythmia and cardiomyopathy, although would not anticipate aggressive invasive cardiac workup at this point. Increase amiodarone to 400 mg twice daily, hold off on beta blocker for now. Would not aggressively diurese, start low-dose Aldactone and low-dose Cozaar. Follow-up ECG tomorrow.   Satira Sark, M.D., F.A.C.C.

## 2016-07-05 NOTE — ED Provider Notes (Signed)
Procedure note in the emergency department: I was called by the intensive care unit to evaluate a patient with right nasal bleeding. She had been admitted for a pulmonary embolism and was on heparin. Heparin was discontinued. On initial exam, patient had some minimal to moderate bleeding in the right anterior nares. Afrin nose spray was applied. Anterior Rhino Rocket inserted. Good hemostasis. Patient tolerated procedure well. Patient was rechecked approximately 20 minutes after procedure and hemostasis was still maintained. She will return to the ICU.  Nat Christen, MD 07/05/16 1728

## 2016-07-05 NOTE — Care Management Important Message (Signed)
Important Message  Patient Details  Name: Miranda Ford MRN: [id number] Date of Birth: August 06, 1924   Medicare Important Message Given:  Yes    Sherald Barge, RN 07/05/2016, 3:19 PM

## 2016-07-05 NOTE — Progress Notes (Signed)
ANTICOAGULATION CONSULT NOTE - Initial Consult  Pharmacy Consult for Heparin Indication: pulmonary embolus  Allergies  Allergen Reactions  . Codeine Nausea Only  . Penicillins Other (See Comments)    "Not allergic just a lot of my family members are allergic". Has patient had a PCN reaction causing immediate rash, facial/tongue/throat swelling, SOB or lightheadedness with hypotension: No Has patient had a PCN reaction causing severe rash involving mucus membranes or skin necrosis: No Has patient had a PCN reaction that required hospitalization No Has patient had a PCN reaction occurring within the last 10 years: No If all of the above answers are "NO", then may proceed with Cephalosporin use.     Patient Measurements: Height: 5' 3.5" (161.3 cm) Weight: 143 lb 11.8 oz (65.2 kg) IBW/kg (Calculated) : 53.55 Heparin Dosing Weight: 64.9 kg  Vital Signs: Temp: 97.3 F (36.3 C) (07/12 0400) Temp Source: Oral (07/12 0400) BP: 122/80 mmHg (07/12 0700) Pulse Rate: 119 (07/12 0700)  Labs:  Recent Labs  07/03/16 1145  07/03/16 1738 07/03/16 2228 07/04/16 0443  HGB 14.3  --   --   --  12.9  HCT 44.0  --   --   --  40.4  PLT 221  --   --   --  196  LABPROT 12.9  --   --   --  15.4*  INR 0.95  --   --   --  1.20  HEPARINUNFRC  --   --   --  0.62 0.56  CREATININE 0.78  --   --   --  0.85  TROPONINI 0.20*  < > 0.18* 0.20* 0.18*  < > = values in this interval not displayed.  Estimated Creatinine Clearance: 41.2 mL/min (by C-G formula based on Cr of 0.85).   Medical History: Past Medical History  Diagnosis Date  . Osteoporosis   . Cataracts, bilateral   . Macular degeneration     Medications:  Scheduled:  . amiodarone  200 mg Oral BID  . antiseptic oral rinse  7 mL Mouth Rinse BID  . atorvastatin  80 mg Oral q1800  . furosemide  20 mg Intravenous BID  . sodium chloride flush  3 mL Intravenous Q12H    Assessment: 82 y.o. female who presents to the Emergency  Department complaining of intermittent episodes of shortness of breath x 3 days. Pharmacy consult for heparin for PE and right heart strain. Restart heparin. Pt did receive 2 doses of apixaban yesterday(last dose at 2300 7/11), therefore will draw baseline aPTT and HL.  Goal of Therapy:  Heparin level 0.3-0.7 units/ml Monitor platelets by anticoagulation protocol: Yes   Plan:  start heparin infusion at 1000 units/hr Check aPTT and heparin level in 8 hours Continue to monitor H&H and platelets  Isac Sarna, BS Vena [place], BCPS Clinical Pharmacist Pager [phone] 07/05/2016,9:40 AM

## 2016-07-06 ENCOUNTER — Inpatient Hospital Stay (HOSPITAL_COMMUNITY): Payer: Medicare Other

## 2016-07-06 DIAGNOSIS — I2699 Other pulmonary embolism without acute cor pulmonale: Secondary | ICD-10-CM | POA: Insufficient documentation

## 2016-07-06 LAB — BASIC METABOLIC PANEL
ANION GAP: 7 (ref 5–15)
BUN: 22 mg/dL — ABNORMAL HIGH (ref 6–20)
CALCIUM: 8.2 mg/dL — AB (ref 8.9–10.3)
CO2: 28 mmol/L (ref 22–32)
CREATININE: 1.02 mg/dL — AB (ref 0.44–1.00)
Chloride: 101 mmol/L (ref 101–111)
GFR, EST AFRICAN AMERICAN: 55 mL/min — AB (ref >60)
GFR, EST NON AFRICAN AMERICAN: 47 mL/min — AB (ref >60)
Glucose, Bld: 122 mg/dL — ABNORMAL HIGH (ref 65–99)
Potassium: 4.6 mmol/L (ref 3.5–5.1)
SODIUM: 136 mmol/L (ref 135–145)

## 2016-07-06 LAB — CBC
HEMATOCRIT: 41.9 % (ref 36.0–46.0)
Hemoglobin: 13.2 g/dL (ref 12.0–15.0)
MCH: 30.1 pg (ref 26.0–34.0)
MCHC: 31.5 g/dL (ref 30.0–36.0)
MCV: 95.4 fL (ref 78.0–100.0)
Platelets: 230 10*3/uL (ref 150–400)
RBC: 4.39 MIL/uL (ref 3.87–5.11)
RDW: 14.5 % (ref 11.5–15.5)
WBC: 9.1 10*3/uL (ref 4.0–10.5)

## 2016-07-06 MED ORDER — ENSURE ENLIVE PO LIQD
237.0000 mL | Freq: Two times a day (BID) | ORAL | Status: DC
  Administered 2016-07-07 – 2016-07-15 (×15): 237 mL via ORAL

## 2016-07-06 NOTE — Progress Notes (Signed)
Consulting Cardiologist: Dorris Carnes MD  Cardiology Specific Problem List: 1. Atrial tachycardia 2. Cardiomyopathy with Biventricular Failure 3. Demand Ischemia  Subjective:    Had epistaxis last evening and need for nasal packing. Less feeling of palpitations. No chest pain. Feels weak, decreased appetite.  Objective:   Temp:  [97.5 F (36.4 C)-97.9 F (36.6 C)] 97.8 F (36.6 C) (07/13 0800) Pulse Rate:  [53-137] 82 (07/13 0952) Resp:  [12-37] 23 (07/13 0600) BP: (65-191)/(45-164) 103/61 mmHg (07/13 0952) SpO2:  [82 %-100 %] 94 % (07/13 0952) Weight:  [153 lb 14.1 oz (69.8 kg)] 153 lb 14.1 oz (69.8 kg) (07/13 0500) Last BM Date: 07/04/16  Filed Weights   07/03/16 1719 07/05/16 0400 07/06/16 0500  Weight: 142 lb 3.2 oz (64.5 kg) 143 lb 11.8 oz (65.2 kg) 153 lb 14.1 oz (69.8 kg)    Intake/Output Summary (Last 24 hours) at 07/06/16 1011 Last data filed at 07/06/16 0620  Gross per 24 hour  Intake 344.33 ml  Output   2050 ml  Net -1705.67 ml    Telemetry: Episodes of atrial tachycardia have decreased significantly, predominantly sinus rhythm at this time with PACs.   Exam:  General: Elderly woman in no distress.  HEENT: Nasal packing into the right nares.   Lungs: Diminished breath sounds, nonlabored.   Cardiac: No elevated JVP or bruits. RRR, 1/6 systolic murmur, no gallop or rub.   Abdomen: Normoactive bowel sounds, nontender, nondistended.  Extremities: No pitting edema, distal pulses full.   Lab Results:  Basic Metabolic Panel:  Recent Labs Lab 07/03/16 1145 07/04/16 0443 07/06/16 0410  NA 135 138 136  K 4.6 3.7 4.6  CL 101 103 101  CO2 26 31 28   GLUCOSE 93 98 122*  BUN 21* 18 22*  CREATININE 0.78 0.85 1.02*  CALCIUM 8.9 7.9* 8.2*  MG 1.9  --   --     Liver Function Tests:  Recent Labs Lab 07/04/16 0443  AST 33  ALT 32  ALKPHOS 93  BILITOT 0.8  PROT 5.5*  ALBUMIN 3.1*    CBC:  Recent Labs Lab 07/04/16 0443  07/05/16 1843 07/06/16 0410  WBC 6.9 12.3* 9.1  HGB 12.9 14.2 13.2  HCT 40.4 43.8 41.9  MCV 94.4 95.6 95.4  PLT 196 239 230    Cardiac Enzymes:  Recent Labs Lab 07/03/16 1738 07/03/16 2228 07/04/16 0443  TROPONINI 0.18* 0.20* 0.18*   Coagulation:  Recent Labs Lab 07/03/16 1145 07/04/16 0443  INR 0.95 1.20    Echocardiogram 07/04/2016: Study Conclusions  - Left ventricle: The cavity size was mildly dilated. Wall  thickness was increased in a pattern of mild LVH. The estimated  ejection fraction was 20%. Diffuse hypokinesis. There is akinesis  of the lateral, inferolateral, and inferior myocardium. Doppler  parameters are consistent with restrictive physiology, indicative  of decreased left ventricular diastolic compliance and/or  increased left atrial pressure. - Aortic valve: Mildly calcified annulus. Trileaflet; moderately  thickened, moderately calcified leaflets. There was mild  regurgitation. Mean gradient (S): 5 mm Hg. VTI ratio of LVOT to  aortic valve: 0.35. Valve area (VTI): 1.22 cm^2. Valve area  (Vmax): 1.43 cm^2. Suspect pseudo-stenosis in the setting of  significantly reduced LVEF. - Mitral valve: Calcified annulus. Mildly calcified leaflets .  There was mild regurgitation. - Left atrium: The atrium was mildly dilated. - Right ventricle: Systolic function was moderately to severely  reduced. - Right atrium: Central venous pressure (est): 3 mm Hg. - Atrial  septum: No defect or patent foramen ovale was identified. - Tricuspid valve: There was trivial regurgitation. - Pulmonary arteries: PA peak pressure: 20 mm Hg (S). - Pericardium, extracardiac: There was a left pleural effusion.  Impressions:  - Mildly dilated LV chamber size with mild LVH and LVEF  approximately 20%. There is severe diffuse hypokinesis with  akinesis of the inferior and inferolateral myocardium. Probable  restrictive diastolic filling pattern. MAC with  calcified mitral  leaflets and mild mitral regurgitation. Mild left atrial  enlargement. Moderately calcified and thickened aortic valve with  reduced cusp excursion and mild aortic regurgitation. Suspect  pseudo-stenosis in the setting of reduced LVEF. Moderately to  severely reduced RV contraction. Trivial tricuspid regurgitation  with PASP estimated 21 mmHg. Left pleural effusion noted.  Chest CT 07/03/2016: FINDINGS: Mediastinum/Lymph Nodes: Some of the most peripheral segmental and subsegmental pulmonary artery branches to the lower lobes are difficult to definitively characterize due to patient breathing motion artifact but there is a single acute-appearing pulmonary embolus identified at the junction of the segmental pulmonary artery branches to the posterior and lateral segments of the right lower lobe.  No other pulmonary emboli identified, with limitations detailed above.  Scattered atherosclerotic changes seen along the walls of the normal-caliber thoracic aorta. No aneurysm or dissection seen. Heart is enlarged. No pericardial effusion. Coronary artery calcifications noted.  No mass or enlarged lymph nodes seen within the mediastinum or perihilar regions. Trachea and central bronchi are unremarkable.  Lungs/Pleura: Bilateral moderate-sized pleural effusions, right slightly greater than left. Associated interstitial edema at the lung bases. Scarring/fibrosis and emphysematous changes noted within the upper lobes. No evidence of pneumonia seen. No pneumothorax.  Upper abdomen: Limited images of the upper abdomen are unremarkable.  Musculoskeletal: Mild degenerative spurring noted within the slightly scoliotic thoracic spine. Mild compression deformity of an upper thoracic vertebral body appears chronic. No acute or suspicious osseous finding. Superficial soft tissues are unremarkable.  Review of the MIP images confirms the above  findings.  IMPRESSION: 1. Single acute-appearing pulmonary embolus identified at the junction of the segmental pulmonary artery branches to the posterior and lateral segments of the right lower lobe. No other pulmonary emboli identified, with mild study limitations detailed above. 2. Bilateral moderate-sized pleural effusions, right slightly greater than left. Associated interstitial edema and atelectasis at the right lung base. No evidence of pneumonia seen. 3. Scarring/fibrosis and emphysematous changes within the upper lobes bilaterally, mild to moderate in degree. 4. Aortic atherosclerosis. 5. Cardiomegaly. Coronary artery calcifications, particularly dense within the left anterior descending and circumflex coronary arteries. Recommend correlation with any possible associated cardiac symptoms.   Medications:   Scheduled Medications: . amiodarone  400 mg Oral BID  . antiseptic oral rinse  7 mL Mouth Rinse BID  . atorvastatin  80 mg Oral q1800  . losartan  12.5 mg Oral Daily  . sodium chloride flush  3 mL Intravenous Q12H  . spironolactone  12.5 mg Oral Daily    PRN Medications: acetaminophen **OR** acetaminophen, metoprolol, polyvinyl alcohol, sodium chloride   Assessment and Plan:   1. Paroxysmal atrial tachycardia. Amiodarone dose was increased yesterday with improvement significant improvement and rhythm control, now in sinus rhythm predominantly.   2. Newly diagnosed cardiomyopathy with biventricular failure, LVEF approximately 20%, severe diffuse hypokinesis with akinesis of the inferior and inferolateral myocardium. Also restrictive diastolic filling pattern. This has been discussed with patient and her family. She does not have any prior known history of cardiomyopathy or definite acute change in  status other than the recent week. She does have coronary artery calcifications by chest CT, so ischemic etiology is possible, but would favor nonischemic cardiomyopathy at  this time. Started on low dose aldactone and Cozaar yesterday Diureses of 2,450 over night.   3. Elevated troponin I in a range most consistent with demand ischemia rather than true ACS.  4. Acute pulmonary embolus, currently off heparin with nosebleed requiring packing.  5. Epistaxis requiring packing. Taken off heparin per primary team. Patient reports history of recurrent nosebleeds over time even on aspirin. Could consider ENT consultation. Lower extremity venous Dopplers being obtained. May need to consider IVC filter if anticoagulation truly cannot be tolerated.  Phill Myron. Lawrence NP [place]  07/06/2016, 10:11 AM    Attending note:  Patient seen and examined. Reviewed chart and modified above noted by Ms. Lawrence NP to reflect my findings and recommendations. Atrial tachycardia burden has significantly decreased following increase in amiodarone. She is also tolerating the addition of Aldactone and Cozaar, both at very low-dose. Blood pressure remains soft with systolics in the [id number] range further limiting up titration of medical therapy. Holding off on beta blocker for now (did not tolerate Lopressor when given early in hospital stay), but she might be able to eventually be placed on low-dose Coreg. On examination she is in no distress, wearing supplemental oxygen. She had epistaxis requiring packing which is still in place on the right. Lungs exhibit diminished breath sounds. Cardiac with RRR and indistinct PMI. No peripheral edema. Creatinine 1.0, hemoglobin 13.2. Overall prognosis is poor in light of complex comorbidities at advanced age. Plan will be medical therapy for her cardiomyopathy as tolerated, would not pursue aggressive invasive workup. Pulmonary embolus is being managed by the primary team.  Satira Sark, M.D., F.A.C.C.

## 2016-07-06 NOTE — Consult Note (Signed)
   [facility] CM Inpatient Consult   07/06/2016  Miranda Ford 13-Aug-1924 [id number]   Spoke with patient and spouse at bedside regarding [facility] services. Patient does not want to participate with [facility] at this time. Patient given [facility] brochure and contact information for future reference.  Of note, [facility] Care Management services would not replace or interfere with any services that are arranged by inpatient case management or social work. For additional questions or referrals please contact:  Royetta Crochet. [name], RN, BSN, [facility] Liaison [phone]

## 2016-07-06 NOTE — Progress Notes (Signed)
PROGRESS NOTE    Miranda Ford  [id number] DOB: 1924/08/10 DOA: 07/03/2016 PCP: Purvis Kilts, MD   Outpatient Specialists:   Brief Narrative:  8 yof presented with SOB found to be secondary to acute PE. On admission she was started on IV heparin, will likely need a few more days of IV therapy prior to transition to oral therapy. She was also noted to have atrial tachycardia started on Lopressor with some improvement. She has also been started on Amiodarone. Cardiology is following. ECHO with EF of 20%.  Assessment & Plan: Active Problems:   Acute pulmonary embolism (HCC)   Tachycardia   Pulmonary embolism (HCC)   Cardiomegaly   Coronary atherosclerosis of native coronary artery   Paroxysmal atrial tachycardia (HCC)   Demand ischemia (HCC)   Acute systolic CHF (congestive heart failure) (HCC)   Acute respiratory failure with hypoxia (HCC)   Secondary cardiomyopathy (HCC)   Biventricular failure (HCC)   Atrial tachycardia (HCC)   Acute congestive heart failure ([place])   1. Acute PE with evidence of right heart strain. Initially started on IV Heparin but had to be discontinued due to significant epistaxis. Check venous ultrasound of legs to rule out DVT, to see if IVC filter would be needed. 2. Acute epistaxis of right nostril. Resolved. 7/12 patient with significant epistaxis from her right nostril that was noted to soak through multiple gauzes and despite applying pressure continued to bleed. Patient was transported to ED by request of Dr. Lacinda Axon for placement of rhinorocket. Bleeding has resolved and Hgb remains stable.  3. Acute respiratory failure with hypoxia. Contiue to wean oxygen as tolerated.  4. Elevated troponins: Likely demand ishemia. No complaints of CP.  5. Paroxysmal atrial tachycardia: Likely secondary to acute PE. Has been started on Lopressor and Amiodarone. Cardiology following and ECHO results as below. Now in sinus rhythm 6. Acute systolic CHF: EF of 123456.  Bilateral pleural effusions noted by chest imaging including CT. Was noted to diuresis 1L on admission. She is already on beta blocker. Cardiology following. 7. Coronary atherosclerosis of native coronary artery. No complaints of chest pain. ECHO does show wall motion abnormalities and depressed EF. Further management per Cardiology   DVT prophylaxis: Heparin  Code Status: Discussed multiple comorbidities and advanced age. Patient agrees to DNR Family Communication: No family bedside.  Disposition Plan: Discharged once improved    Consultants:   Cardiology   Procedures:  ECHO  - Left ventricle: The cavity size was mildly dilated. Wall  thickness was increased in a pattern of mild LVH. The estimated  ejection fraction was 20%. Diffuse hypokinesis. There is akinesis  of the lateral, inferolateral, and inferior myocardium. Doppler  parameters are consistent with restrictive physiology, indicative  of decreased left ventricular diastolic compliance and/or  increased left atrial pressure. - Aortic valve: Mildly calcified annulus. Trileaflet; moderately  thickened, moderately calcified leaflets. There was mild  regurgitation. Mean gradient (S): 5 mm Hg. VTI ratio of LVOT to  aortic valve: 0.35. Valve area (VTI): 1.22 cm^2. Valve area  (Vmax): 1.43 cm^2. Suspect pseudo-stenosis in the setting of  significantly reduced LVEF. - Mitral valve: Calcified annulus. Mildly calcified leaflets .  There was mild regurgitation. - Left atrium: The atrium was mildly dilated. - Right ventricle: Systolic function was moderately to severely  reduced. - Right atrium: Central venous pressure (est): 3 mm Hg. - Atrial septum: No defect or patent foramen ovale was identified. - Tricuspid valve: There was trivial regurgitation. - Pulmonary arteries: PA  peak pressure: 20 mm Hg (S). - Pericardium, extracardiac: There was a left pleural effusion.  Rhino rocket right nostril  7/12  Antimicrobials:   None   Subjective: No further bleeding from right nostril. Shortness of breath is better today  Objective: Filed Vitals:   07/06/16 0300 07/06/16 0400 07/06/16 0500 07/06/16 0600  BP: 95/66 98/66 94/66  102/66  Pulse: 74 84 79 75  Temp:  97.5 F (36.4 C)    TempSrc:  Oral    Resp: 12 19 20 23   Height:      Weight:   69.8 kg (153 lb 14.1 oz)   SpO2: 97% 95% 94% 97%    Intake/Output Summary (Last 24 hours) at 07/06/16 0644 Last data filed at 07/06/16 [id number]  Gross per 24 hour  Intake 584.33 ml  Output   2450 ml  Net -1865.67 ml   Filed Weights   07/03/16 1719 07/05/16 0400 07/06/16 0500  Weight: 64.5 kg (142 lb 3.2 oz) 65.2 kg (143 lb 11.8 oz) 69.8 kg (153 lb 14.1 oz)    Examination:  General exam: Appears calm and comfortable, rhino rocket in right nostril, no bleeding noted  Respiratory system: Respiratory effort normal. Crackles at the bases. Able to speak in full sentences.  Cardiovascular system: S1 & S2 heard, RRR. No JVD, murmurs, rubs, gallops or clicks. 1+ bilateral pedal edema. Gastrointestinal system: Abdomen is nondistended, soft and nontender. No organomegaly or masses felt. Normal bowel sounds heard. Central nervous system: Alert and oriented. No focal neurological deficits. Extremities: Symmetric 5 x 5 power. Skin: No rashes, lesions or ulcers Psychiatry: Judgement and insight appear normal. Mood & affect appropriate.     Data Reviewed: I have personally reviewed following labs and imaging studies  CBC:  Recent Labs Lab 07/03/16 1145 07/04/16 0443 07/05/16 1843 07/06/16 0410  WBC 8.0 6.9 12.3* 9.1  HGB 14.3 12.9 14.2 13.2  HCT 44.0 40.4 43.8 41.9  MCV 94.0 94.4 95.6 95.4  PLT 221 196 239 [id number]   Basic Metabolic Panel:  Recent Labs Lab 07/03/16 1145 07/04/16 0443 07/06/16 0410  NA 135 138 136  K 4.6 3.7 4.6  CL 101 103 101  CO2 26 31 28   GLUCOSE 93 98 122*  BUN 21* 18 22*  CREATININE 0.78 0.85 1.02*  CALCIUM  8.9 7.9* 8.2*  MG 1.9  --   --    GFR: Estimated Creatinine Clearance: 35.5 mL/min (by C-G formula based on Cr of 1.02). Liver Function Tests:  Recent Labs Lab 07/04/16 0443  AST 33  ALT 32  ALKPHOS 93  BILITOT 0.8  PROT 5.5*  ALBUMIN 3.1*   No results for input(s): LIPASE, AMYLASE in the last 168 hours. No results for input(s): AMMONIA in the last 168 hours. Coagulation Profile:  Recent Labs Lab 07/03/16 1145 07/04/16 0443  INR 0.95 1.20   Cardiac Enzymes:  Recent Labs Lab 07/03/16 1145 07/03/16 1612 07/03/16 1738 07/03/16 2228 07/04/16 0443  TROPONINI 0.20* 0.21* 0.18* 0.20* 0.18*   BNP (last 3 results) No results for input(s): PROBNP in the last 8760 hours. HbA1C: No results for input(s): HGBA1C in the last 72 hours. CBG: No results for input(s): GLUCAP in the last 168 hours. Lipid Profile:  Recent Labs  07/04/16 0443  CHOL 141  HDL 56  LDLCALC 77  TRIG 41  CHOLHDL 2.5   Thyroid Function Tests:  Recent Labs  07/03/16 1612  TSH 2.258   Anemia Panel: No results for input(s): VITAMINB12, FOLATE, FERRITIN, TIBC, IRON,  RETICCTPCT in the last 72 hours. Urine analysis:    Component Value Date/Time   COLORURINE YELLOW 07/03/2016 1557   APPEARANCEUR CLEAR 07/03/2016 1557   LABSPEC 1.010 07/03/2016 1557   PHURINE 5.5 07/03/2016 1557   GLUCOSEU NEGATIVE 07/03/2016 1557   HGBUR NEGATIVE 07/03/2016 1557   BILIRUBINUR NEGATIVE 07/03/2016 1557   KETONESUR NEGATIVE 07/03/2016 1557   PROTEINUR NEGATIVE 07/03/2016 1557   NITRITE NEGATIVE 07/03/2016 1557   LEUKOCYTESUR NEGATIVE 07/03/2016 1557   Sepsis Labs: @LABRCNTIP (procalcitonin:4,lacticidven:4)  ) Recent Results (from the past 240 hour(s))  MRSA PCR Screening     Status: None   Collection Time: 07/03/16  5:20 PM  Result Value Ref Range Status   MRSA by PCR NEGATIVE NEGATIVE Final    Comment:        The GeneXpert MRSA Assay (FDA approved for NASAL specimens only), is one component of  a comprehensive MRSA colonization surveillance program. It is not intended to diagnose MRSA infection nor to guide or monitor treatment for MRSA infections.          Radiology Studies: No results found.      Scheduled Meds: . amiodarone  400 mg Oral BID  . antiseptic oral rinse  7 mL Mouth Rinse BID  . atorvastatin  80 mg Oral q1800  . losartan  12.5 mg Oral Daily  . sodium chloride flush  3 mL Intravenous Q12H  . spironolactone  12.5 mg Oral Daily   Continuous Infusions:     LOS: 3 days    Time spent: 25 minutes   Kathie Dike, MD [facility] If 7PM-7AM, please contact night-coverage [email or web address] Password [facility] 07/06/2016, 6:44 AM

## 2016-07-07 DIAGNOSIS — R040 Epistaxis: Secondary | ICD-10-CM | POA: Diagnosis not present

## 2016-07-07 LAB — BASIC METABOLIC PANEL
ANION GAP: 8 (ref 5–15)
BUN: 29 mg/dL — AB (ref 6–20)
CO2: 29 mmol/L (ref 22–32)
Calcium: 8.5 mg/dL — ABNORMAL LOW (ref 8.9–10.3)
Chloride: 94 mmol/L — ABNORMAL LOW (ref 101–111)
Creatinine, Ser: 1.23 mg/dL — ABNORMAL HIGH (ref 0.44–1.00)
GFR calc Af Amer: 44 mL/min — ABNORMAL LOW (ref >60)
GFR calc non Af Amer: 38 mL/min — ABNORMAL LOW (ref >60)
GLUCOSE: 148 mg/dL — AB (ref 65–99)
POTASSIUM: 4.7 mmol/L (ref 3.5–5.1)
Sodium: 131 mmol/L — ABNORMAL LOW (ref 135–145)

## 2016-07-07 LAB — CBC
HEMATOCRIT: 42.1 % (ref 36.0–46.0)
Hemoglobin: 13.8 g/dL (ref 12.0–15.0)
MCH: 30.9 pg (ref 26.0–34.0)
MCHC: 32.8 g/dL (ref 30.0–36.0)
MCV: 94.4 fL (ref 78.0–100.0)
PLATELETS: 203 10*3/uL (ref 150–400)
RBC: 4.46 MIL/uL (ref 3.87–5.11)
RDW: 14.3 % (ref 11.5–15.5)
WBC: 11.3 10*3/uL — AB (ref 4.0–10.5)

## 2016-07-07 MED ORDER — APIXABAN 5 MG PO TABS
5.0000 mg | ORAL_TABLET | Freq: Two times a day (BID) | ORAL | Status: DC
  Administered 2016-07-14 – 2016-07-15 (×3): 5 mg via ORAL
  Filled 2016-07-07 (×3): qty 1

## 2016-07-07 MED ORDER — MAGNESIUM HYDROXIDE 400 MG/5ML PO SUSP
30.0000 mL | Freq: Every day | ORAL | Status: DC | PRN
  Administered 2016-07-07: 30 mL via ORAL
  Filled 2016-07-07: qty 30

## 2016-07-07 MED ORDER — MILK AND MOLASSES ENEMA
1.0000 | Freq: Once | RECTAL | Status: DC
  Filled 2016-07-07: qty 250

## 2016-07-07 MED ORDER — APIXABAN 5 MG PO TABS
10.0000 mg | ORAL_TABLET | Freq: Two times a day (BID) | ORAL | Status: AC
  Administered 2016-07-07 – 2016-07-13 (×14): 10 mg via ORAL
  Filled 2016-07-07 (×14): qty 2

## 2016-07-07 MED ORDER — CEPHALEXIN 500 MG PO CAPS
500.0000 mg | ORAL_CAPSULE | Freq: Three times a day (TID) | ORAL | Status: DC
  Administered 2016-07-07 – 2016-07-15 (×24): 500 mg via ORAL
  Filled 2016-07-07 (×33): qty 1

## 2016-07-07 NOTE — Discharge Instructions (Signed)
Information on my medicine - ELIQUIS (apixaban)  This medication education was reviewed with me or my healthcare representative as part of my discharge preparation.   Why was Eliquis prescribed for you? Eliquis was prescribed to treat blood clots that may have been found in the veins of your legs (deep vein thrombosis) or in your lungs (pulmonary embolism) and to reduce the risk of them occurring again.  What do You need to know about Eliquis ? The starting dose is 10 mg (two 5 mg tablets) taken TWICE daily for the FIRST SEVEN (7) DAYS, then on (enter date)  07/14/13  the dose is reduced to ONE 5 mg tablet taken TWICE daily.  Eliquis may be taken with or without food.   Try to take the dose about the same time in the morning and in the evening. If you have difficulty swallowing the tablet whole please discuss with your pharmacist how to take the medication safely.  Take Eliquis exactly as prescribed and DO NOT stop taking Eliquis without talking to the doctor who prescribed the medication.  Stopping may increase your risk of developing a new blood clot.  Refill your prescription before you run out.  After discharge, you should have regular check-up appointments with your healthcare provider that is prescribing your Eliquis.    What do you do if you miss a dose? If a dose of ELIQUIS is not taken at the scheduled time, take it as soon as possible on the same day and twice-daily administration should be resumed. The dose should not be doubled to make up for a missed dose.  Important Safety Information A possible side effect of Eliquis is bleeding. You should call your healthcare provider right away if you experience any of the following: ? Bleeding from an injury or your nose that does not stop. ? Unusual colored urine (red or dark brown) or unusual colored stools (red or black). ? Unusual bruising for unknown reasons. ? A serious fall or if you hit your head (even if there is no  bleeding).  Some medicines may interact with Eliquis and might increase your risk of bleeding or clotting while on Eliquis. To help avoid this, consult your healthcare provider or pharmacist prior to using any new prescription or non-prescription medications, including herbals, vitamins, non-steroidal anti-inflammatory drugs (NSAIDs) and supplements.  This website has more information on Eliquis (apixaban): http://[email or web address].com/eliquis/home

## 2016-07-07 NOTE — Progress Notes (Signed)
Nutrition Brief Note  Patient identified on the Malnutrition Screening Tool (MST) Report  Wt Readings from Last 15 Encounters:  07/07/16 157 lb 10.1 oz (71.5 kg)  07/01/13 140 lb (63.504 kg)   Pt was admitted at weight of 143 lbs.   One scanned encounter from 2013 shows wt was 141 lbs, indicating stability. Pt also confirms that her UBW is 140 lbs.  She says that her the last day or so she hasnt been able to eat because she was impacted: "I just couldn't hold anymore". Early today she took some MOM to affect and now reports much relief. She is seen eating lunch.  PTA patient reports eating well w/ weight stability. She sounded to follow healthy diet and even was mowing her lawn. Appears to have been in remarkable health given age  Current diet order is Regular, patient is consuming approximately 50-75% of meals at this time. Labs and medications reviewed.   No nutrition interventions warranted at this time. If nutrition issues arise, please consult RD.   Burtis Junes RD, LDN, CNSC Clinical Nutrition Pager: [phone] 07/07/2016 1:09 PM

## 2016-07-07 NOTE — Care Management Important Message (Signed)
Important Message  Patient Details  Name: Miranda Ford MRN: [id number] Date of Birth: 1924-04-03   Medicare Important Message Given:  Yes    Sherald Barge, RN 07/07/2016, 8:06 AM

## 2016-07-07 NOTE — Progress Notes (Signed)
PROGRESS NOTE    Miranda Ford  [id number] DOB: 12/28/1923 DOA: 07/03/2016 PCP: Purvis Kilts, MD   Outpatient Specialists:   Brief Narrative:  58 yof presented with SOB found to be secondary to acute PE. On admission she was started on anticoagulation. Hospital course complicated by development of significant epistaxis requiring anterior nasal packing. Bleeding has stopped and she will be restarted on anticoagulation. She was also noted to have atrial tachycardia started on amiodarone with some improvement. Cardiology is following. ECHO with EF of 20%.  Assessment & Plan: Active Problems:   Acute pulmonary embolism (HCC)   Tachycardia   Pulmonary embolism (HCC)   Cardiomegaly   Coronary atherosclerosis of native coronary artery   Paroxysmal atrial tachycardia (HCC)   Demand ischemia (HCC)   Acute systolic CHF (congestive heart failure) (HCC)   Acute respiratory failure with hypoxia (HCC)   Secondary cardiomyopathy (HCC)   Biventricular failure (HCC)   Atrial tachycardia (HCC)   Acute congestive heart failure ([place])   Pulmonary embolism without acute cor pulmonale ([place])   1. Acute PE with evidence of right heart strain. Initially started on IV Heparin but had to be discontinued due to significant epistaxis. Ultrasound negative for DVT. She has not had further evidence of bleeding. Will restart Eliquis 2. Acute epistaxis of right nostril. Resolved. 7/12 patient with significant epistaxis from her right nostril that was noted to soak through multiple gauzes and despite applying pressure continued to bleed. Patient was transported to ED by request of Dr. Lacinda Axon for placement of rhinorocket. Bleeding has resolved and Hgb remains stable. Discussed with Dr. Redmond Baseman (ENT) in [place] who said that nasal packing can be left in place for 3-5 days. He recommended to start the patient on keflex while packing in place. He will follow the patient up next week to remove packing. 3. Acute  respiratory failure with hypoxia. Contiue to wean oxygen as tolerated.  4. Elevated troponins: Likely demand ishemia. No complaints of CP.  5. Paroxysmal atrial tachycardia: Has been started on Amiodarone. Cardiology following and ECHO results as below. Heart rate was improved yesterday, but today having episodes of tachycardia with HR in the 130-140 range.   6. Acute systolic CHF: EF of 123456. Bilateral pleural effusions noted by chest imaging including CT. Was noted to diuresis 1L on admission. Cardiology recommends holding off on beta blocker for now since she did not tolerate lopressor earlier. She is on aldactone and low dose losartan. Appreciate cardiology input.  7. Coronary atherosclerosis of native coronary artery. No complaints of chest pain. ECHO does show wall motion abnormalities and depressed EF. Further management per Cardiology   DVT prophylaxis: eliquis  Code Status:  DNR Family Communication: no family present Disposition Plan: Discharged once improved    Consultants:   Cardiology   ENT, Redmond Baseman (phone)  Procedures:  ECHO  - Left ventricle: The cavity size was mildly dilated. Wall  thickness was increased in a pattern of mild LVH. The estimated  ejection fraction was 20%. Diffuse hypokinesis. There is akinesis  of the lateral, inferolateral, and inferior myocardium. Doppler  parameters are consistent with restrictive physiology, indicative  of decreased left ventricular diastolic compliance and/or  increased left atrial pressure. - Aortic valve: Mildly calcified annulus. Trileaflet; moderately  thickened, moderately calcified leaflets. There was mild  regurgitation. Mean gradient (S): 5 mm Hg. VTI ratio of LVOT to  aortic valve: 0.35. Valve area (VTI): 1.22 cm^2. Valve area  (Vmax): 1.43 cm^2. Suspect pseudo-stenosis in the setting  of  significantly reduced LVEF. - Mitral valve: Calcified annulus. Mildly calcified leaflets .  There was mild  regurgitation. - Left atrium: The atrium was mildly dilated. - Right ventricle: Systolic function was moderately to severely  reduced. - Right atrium: Central venous pressure (est): 3 mm Hg. - Atrial septum: No defect or patent foramen ovale was identified. - Tricuspid valve: There was trivial regurgitation. - Pulmonary arteries: PA peak pressure: 20 mm Hg (S). - Pericardium, extracardiac: There was a left pleural effusion.  Rhino rocket right nostril 7/12  Antimicrobials:   Keflex 7/14>>   Subjective: Has not had a bowel movement in several days. Feels uncomfortable from nasal packing.   Objective: Filed Vitals:   07/07/16 0300 07/07/16 0400 07/07/16 0500 07/07/16 0600  BP:  112/71    Pulse: 76 78 85 51  Temp:  97.9 F (36.6 C)    TempSrc:  Oral    Resp: 17 17 19 23   Height:      Weight:   71.5 kg (157 lb 10.1 oz)   SpO2: 97% 96% 97% 98%    Intake/Output Summary (Last 24 hours) at 07/07/16 0659 Last data filed at 07/07/16 0630  Gross per 24 hour  Intake    960 ml  Output    700 ml  Net    260 ml   Filed Weights   07/05/16 0400 07/06/16 0500 07/07/16 0500  Weight: 65.2 kg (143 lb 11.8 oz) 69.8 kg (153 lb 14.1 oz) 71.5 kg (157 lb 10.1 oz)    Examination:  General exam: Appears calm and comfortable, rhino rocket in right nostril, no bleeding noted  Respiratory system: Respiratory effort normal. Lungs clear bilaterally. Able to speak in full sentences.  Cardiovascular system: S1 & S2 heard, irregular. No JVD, murmurs, rubs, gallops or clicks. 1+ bilateral pedal edema. Gastrointestinal system: Abdomen is nondistended, soft and nontender. No organomegaly or masses felt. Normal bowel sounds heard. Central nervous system: Alert and oriented. No focal neurological deficits. Extremities: Symmetric 5 x 5 power. Skin: No rashes, lesions or ulcers Psychiatry: Judgement and insight appear normal. Mood & affect appropriate.     Data Reviewed: I have personally reviewed  following labs and imaging studies  CBC:  Recent Labs Lab 07/03/16 1145 07/04/16 0443 07/05/16 1843 07/06/16 0410  WBC 8.0 6.9 12.3* 9.1  HGB 14.3 12.9 14.2 13.2  HCT 44.0 40.4 43.8 41.9  MCV 94.0 94.4 95.6 95.4  PLT 221 196 239 123456   Basic Metabolic Panel:  Recent Labs Lab 07/03/16 1145 07/04/16 0443 07/06/16 0410  NA 135 138 136  K 4.6 3.7 4.6  CL 101 103 101  CO2 26 31 28   GLUCOSE 93 98 122*  BUN 21* 18 22*  CREATININE 0.78 0.85 1.02*  CALCIUM 8.9 7.9* 8.2*  MG 1.9  --   --    GFR: Estimated Creatinine Clearance: 35.9 mL/min (by C-G formula based on Cr of 1.02). Liver Function Tests:  Recent Labs Lab 07/04/16 0443  AST 33  ALT 32  ALKPHOS 93  BILITOT 0.8  PROT 5.5*  ALBUMIN 3.1*   No results for input(s): LIPASE, AMYLASE in the last 168 hours. No results for input(s): AMMONIA in the last 168 hours. Coagulation Profile:  Recent Labs Lab 07/03/16 1145 07/04/16 0443  INR 0.95 1.20   Cardiac Enzymes:  Recent Labs Lab 07/03/16 1145 07/03/16 1612 07/03/16 1738 07/03/16 2228 07/04/16 0443  TROPONINI 0.20* 0.21* 0.18* 0.20* 0.18*   BNP (last 3 results) No results  for input(s): PROBNP in the last 8760 hours. HbA1C: No results for input(s): HGBA1C in the last 72 hours. CBG: No results for input(s): GLUCAP in the last 168 hours. Lipid Profile: No results for input(s): CHOL, HDL, LDLCALC, TRIG, CHOLHDL, LDLDIRECT in the last 72 hours. Thyroid Function Tests: No results for input(s): TSH, T4TOTAL, FREET4, T3FREE, THYROIDAB in the last 72 hours. Anemia Panel: No results for input(s): VITAMINB12, FOLATE, FERRITIN, TIBC, IRON, RETICCTPCT in the last 72 hours. Urine analysis:    Component Value Date/Time   COLORURINE YELLOW 07/03/2016 1557   APPEARANCEUR CLEAR 07/03/2016 1557   LABSPEC 1.010 07/03/2016 1557   PHURINE 5.5 07/03/2016 1557   GLUCOSEU NEGATIVE 07/03/2016 1557   HGBUR NEGATIVE 07/03/2016 1557   BILIRUBINUR NEGATIVE 07/03/2016 1557    KETONESUR NEGATIVE 07/03/2016 1557   PROTEINUR NEGATIVE 07/03/2016 1557   NITRITE NEGATIVE 07/03/2016 1557   LEUKOCYTESUR NEGATIVE 07/03/2016 1557   Sepsis Labs: @LABRCNTIP (procalcitonin:4,lacticidven:4)  ) Recent Results (from the past 240 hour(s))  MRSA PCR Screening     Status: None   Collection Time: 07/03/16  5:20 PM  Result Value Ref Range Status   MRSA by PCR NEGATIVE NEGATIVE Final    Comment:        The GeneXpert MRSA Assay (FDA approved for NASAL specimens only), is one component of a comprehensive MRSA colonization surveillance program. It is not intended to diagnose MRSA infection nor to guide or monitor treatment for MRSA infections.          Radiology Studies: US Venous Img Lower Bilateral  07/06/2016  CLINICAL DATA:  Pulmonary embolism. History of prior pulmonary embolism. Evaluate for DVT. EXAM: BILATERAL LOWER EXTREMITY VENOUS DOPPLER ULTRASOUND TECHNIQUE: Gray-scale sonography with graded compression, as well as color Doppler and duplex ultrasound were performed to evaluate the lower extremity deep venous systems from the level of the common femoral vein and including the common femoral, femoral, profunda femoral, popliteal and calf veins including the posterior tibial, peroneal and gastrocnemius veins when visible. The superficial great saphenous vein was also interrogated. Spectral Doppler was utilized to evaluate flow at rest and with distal augmentation maneuvers in the common femoral, femoral and popliteal veins. COMPARISON:  None. FINDINGS: RIGHT LOWER EXTREMITY Common Femoral Vein: No evidence of thrombus. Normal compressibility, respiratory phasicity and response to augmentation. Saphenofemoral Junction: No evidence of thrombus. Normal compressibility and flow on color Doppler imaging. Profunda Femoral Vein: No evidence of thrombus. Normal compressibility and flow on color Doppler imaging. Femoral Vein: No evidence of thrombus. Normal compressibility,  respiratory phasicity and response to augmentation. Popliteal Vein: No evidence of thrombus. Normal compressibility, respiratory phasicity and response to augmentation. Note is made of mild fusiform ectasia involving the mid aspect of the right popliteal vein (representative images 20 and 30). Calf Veins: No evidence of thrombus. Normal compressibility and flow on color Doppler imaging. Superficial Great Saphenous Vein: No evidence of thrombus. Normal compressibility and flow on color Doppler imaging. Venous Reflux:  None. Other Findings:  None. LEFT LOWER EXTREMITY Common Femoral Vein: No evidence of thrombus. Normal compressibility, respiratory phasicity and response to augmentation. Saphenofemoral Junction: No evidence of thrombus. Normal compressibility and flow on color Doppler imaging. Profunda Femoral Vein: No evidence of thrombus. Normal compressibility and flow on color Doppler imaging. Femoral Vein: No evidence of thrombus. Normal compressibility, respiratory phasicity and response to augmentation. Popliteal Vein: No evidence of thrombus. Normal compressibility, respiratory phasicity and response to augmentation. Calf Veins: No evidence of thrombus. Normal compressibility and flow on color Doppler  imaging. Superficial Great Saphenous Vein: No evidence of thrombus. Normal compressibility and flow on color Doppler imaging. Venous Reflux:  None. Other Findings:  None. IMPRESSION: No evidence of acute or chronic DVT within either lower extremity. Electronically Signed   By: Sandi Mariscal M.D.   On: 07/06/2016 14:37        Scheduled Meds: . amiodarone  400 mg Oral BID  . antiseptic oral rinse  7 mL Mouth Rinse BID  . atorvastatin  80 mg Oral q1800  . feeding supplement (ENSURE ENLIVE)  237 mL Oral BID BM  . losartan  12.5 mg Oral Daily  . sodium chloride flush  3 mL Intravenous Q12H  . spironolactone  12.5 mg Oral Daily   Continuous Infusions:     LOS: 4 days    Time spent: 25  minutes   Kathie Dike, MD [facility] If 7PM-7AM, please contact night-coverage [email or web address] Password [facility] 07/07/2016, 6:59 AM

## 2016-07-07 NOTE — Care Management Note (Signed)
Case Management Note  Patient Details  Name: Miranda Ford MRN: [id number] Date of Birth: January 05, 1924  Expected Discharge Date:     07/08/2016             Expected Discharge Plan:  Home/Self Care  In-House Referral:  NA  Discharge planning Services     Post Acute Care Choice:  NA Choice offered to:  NA  DME Arranged:    DME Agency:     HH Arranged:    [place] Agency:     Status of Service: Will cont to follow.    If discussed at [place] of Stay Meetings, dates discussed:    Additional Comments: Pt currently requiring 6lpm [place]. Pt will need PT eval and home O2 assessment once closer to DC. May benefit from [facility] nursing at DC. Will cont to follow.   Sherald Barge, RN 07/07/2016, 8:09 AM

## 2016-07-07 NOTE — Progress Notes (Addendum)
ANTICOAGULATION CONSULT NOTE - Initial Consult  Pharmacy Consult for Eliquis (apixaban) Indication: pulmonary embolus  Allergies  Allergen Reactions  . Codeine Nausea Only  . Penicillins Other (See Comments)    "Not allergic just a lot of my family members are allergic". Has patient had a PCN reaction causing immediate rash, facial/tongue/throat swelling, SOB or lightheadedness with hypotension: No Has patient had a PCN reaction causing severe rash involving mucus membranes or skin necrosis: No Has patient had a PCN reaction that required hospitalization No Has patient had a PCN reaction occurring within the last 10 years: No If all of the above answers are "NO", then may proceed with Cephalosporin use.    Patient Measurements: Height: 5' 3.5" (161.3 cm) Weight: 157 lb 10.1 oz (71.5 kg) IBW/kg (Calculated) : 53.55  Vital Signs: Temp: 97.7 F (36.5 C) (07/14 0752) Temp Source: Oral (07/14 0752) BP: 95/76 mmHg (07/14 0850) Pulse Rate: 91 (07/14 0900)  Labs:  Recent Labs  07/05/16 1010 07/05/16 1843 07/06/16 0410  HGB  --  14.2 13.2  HCT  --  43.8 41.9  PLT  --  239 230  APTT 37 40*  --   HEPARINUNFRC >2.20* >2.20*  --   CREATININE  --   --  1.02*   Estimated Creatinine Clearance: 35.9 mL/min (by C-G formula based on Cr of 1.02).  Medical History: Past Medical History  Diagnosis Date  . Osteoporosis   . Cataracts, bilateral   . Macular degeneration    Medications:  Scheduled:  . amiodarone  400 mg Oral BID  . antiseptic oral rinse  7 mL Mouth Rinse BID  . atorvastatin  80 mg Oral q1800  . cephALEXin  500 mg Oral Q8H  . feeding supplement (ENSURE ENLIVE)  237 mL Oral BID BM  . losartan  12.5 mg Oral Daily  . milk and molasses  1 enema Rectal Once  . sodium chloride flush  3 mL Intravenous Q12H  . spironolactone  12.5 mg Oral Daily   Assessment: 82 y.o. female who presents to the Emergency Department complaining of intermittent episodes of shortness of  breath x 3 days.  Pt was on IV Heparin previously.  Pharmacy consult for PE, transitioning treatment to eliquis.  Notes indicate pt has had some issues with nose bleeds, will need to monitor closely.    Goal of Therapy:  Monitor platelets by anticoagulation protocol: Yes   Plan:  Eliquis (apixaban) 10mg  po BID for 7 days, then 5mg  po BID Continue to monitor H&H and platelets Monitor for nose bleeds, s/sx of bleeding complications F/U education  Hart Robinsons, PharmD Clinical Pharmacist Pager:  [phone] 07/07/2016 10:08 AM

## 2016-07-07 NOTE — Progress Notes (Signed)
Primary Cardiologist: Miranda Carnes MD  Cardiology Specific Problem List: 1. Atrial fibrillation 2. Cardiomyopathy with Biventricular Failure 3. Demand Ischemia   Subjective:   Complaints of severe constipation, and wants packing removed from her nose. Breathing more labored with increased heart rate.    Objective:   Temp:  [97.1 F (36.2 C)-98 F (36.7 C)] 97.7 F (36.5 C) (07/14 0752) Pulse Rate:  [51-86] 51 (07/14 0600) Resp:  [16-25] 23 (07/14 0600) BP: (65-125)/(45-79) 112/71 mmHg (07/14 0400) SpO2:  [92 %-100 %] 98 % (07/14 0600) FiO2 (%):  [60 %] 60 % (07/13 1659) Weight:  [157 lb 10.1 oz (71.5 kg)] 157 lb 10.1 oz (71.5 kg) (07/14 0500) Last BM Date: 07/04/16  Filed Weights   07/05/16 0400 07/06/16 0500 07/07/16 0500  Weight: 143 lb 11.8 oz (65.2 kg) 153 lb 14.1 oz (69.8 kg) 157 lb 10.1 oz (71.5 kg)    Intake/Output Summary (Last 24 hours) at 07/07/16 0839 Last data filed at 07/07/16 0630  Gross per 24 hour  Intake    720 ml  Output    700 ml  Net     20 ml    Telemetry:Atrial fib with rapid ventricular response in the 130's and 140's since sitting on side of bed to eat breakfast.   Exam:  General: No acute distress.  HEENT: Conjunctiva and lids normal, oropharynx clear.  Lungs: Clear to auscultation, nonlabored.  Cardiac: No elevated JVP or bruits. IRRR, tachycardic, no gallop or rub.   Abdomen: Normoactive bowel sounds, nontender, nondistended.  Extremities: No pitting edema, distal pulses full.  Neuropsychiatric: Alert and oriented x3, affect appropriate.   Lab Results:  Basic Metabolic Panel:  Recent Labs Lab 07/03/16 1145 07/04/16 0443 07/06/16 0410  NA 135 138 136  K 4.6 3.7 4.6  CL 101 103 101  CO2 26 31 28   GLUCOSE 93 98 122*  BUN 21* 18 22*  CREATININE 0.78 0.85 1.02*  CALCIUM 8.9 7.9* 8.2*  MG 1.9  --   --     Liver Function Tests:  Recent Labs Lab 07/04/16 0443  AST 33  ALT 32  ALKPHOS 93  BILITOT 0.8  PROT  5.5*  ALBUMIN 3.1*    CBC:  Recent Labs Lab 07/04/16 0443 07/05/16 1843 07/06/16 0410  WBC 6.9 12.3* 9.1  HGB 12.9 14.2 13.2  HCT 40.4 43.8 41.9  MCV 94.4 95.6 95.4  PLT 196 239 230    Cardiac Enzymes:  Recent Labs Lab 07/03/16 1738 07/03/16 2228 07/04/16 0443  TROPONINI 0.18* 0.20* 0.18*    Coagulation:  Recent Labs Lab 07/03/16 1145 07/04/16 0443  INR 0.95 1.20    Radiology: US Venous Img Lower Bilateral  07/06/2016  CLINICAL DATA:  Pulmonary embolism. History of prior pulmonary embolism. Evaluate for DVT. EXAM: BILATERAL LOWER EXTREMITY VENOUS DOPPLER ULTRASOUND TECHNIQUE: Gray-scale sonography with graded compression, as well as color Doppler and duplex ultrasound were performed to evaluate the lower extremity deep venous systems from the level of the common femoral vein and including the common femoral, femoral, profunda femoral, popliteal and calf veins including the posterior tibial, peroneal and gastrocnemius veins when visible. The superficial great saphenous vein was also interrogated. Spectral Doppler was utilized to evaluate flow at rest and with distal augmentation maneuvers in the common femoral, femoral and popliteal veins. COMPARISON:  None. FINDINGS: RIGHT LOWER EXTREMITY Common Femoral Vein: No evidence of thrombus. Normal compressibility, respiratory phasicity and response to augmentation. Saphenofemoral Junction: No evidence of thrombus. Normal  compressibility and flow on color Doppler imaging. Profunda Femoral Vein: No evidence of thrombus. Normal compressibility and flow on color Doppler imaging. Femoral Vein: No evidence of thrombus. Normal compressibility, respiratory phasicity and response to augmentation. Popliteal Vein: No evidence of thrombus. Normal compressibility, respiratory phasicity and response to augmentation. Note is made of mild fusiform ectasia involving the mid aspect of the right popliteal vein (representative images 20 and 30). Calf  Veins: No evidence of thrombus. Normal compressibility and flow on color Doppler imaging. Superficial Great Saphenous Vein: No evidence of thrombus. Normal compressibility and flow on color Doppler imaging. Venous Reflux:  None. Other Findings:  None. LEFT LOWER EXTREMITY Common Femoral Vein: No evidence of thrombus. Normal compressibility, respiratory phasicity and response to augmentation. Saphenofemoral Junction: No evidence of thrombus. Normal compressibility and flow on color Doppler imaging. Profunda Femoral Vein: No evidence of thrombus. Normal compressibility and flow on color Doppler imaging. Femoral Vein: No evidence of thrombus. Normal compressibility, respiratory phasicity and response to augmentation. Popliteal Vein: No evidence of thrombus. Normal compressibility, respiratory phasicity and response to augmentation. Calf Veins: No evidence of thrombus. Normal compressibility and flow on color Doppler imaging. Superficial Great Saphenous Vein: No evidence of thrombus. Normal compressibility and flow on color Doppler imaging. Venous Reflux:  None. Other Findings:  None. IMPRESSION: No evidence of acute or chronic DVT within either lower extremity. Electronically Signed   By: Sandi Mariscal M.D.   On: 07/06/2016 14:37     ECG:   Medications:   Scheduled Medications: . amiodarone  400 mg Oral BID  . antiseptic oral rinse  7 mL Mouth Rinse BID  . atorvastatin  80 mg Oral q1800  . feeding supplement (ENSURE ENLIVE)  237 mL Oral BID BM  . losartan  12.5 mg Oral Daily  . sodium chloride flush  3 mL Intravenous Q12H  . spironolactone  12.5 mg Oral Daily    Infusions:    PRN Medications: acetaminophen **OR** acetaminophen, metoprolol, polyvinyl alcohol, sodium chloride   Assessment and Plan:   1. Paroxysmal atrial tachycardia  In SR more  . Amiodarone dose was increased yesterday with improvement significant improvement and rhythm control,however rates have increased this am since sitting  up to eat breakfast. Rates in the 130's and 140's at rest while sitting on side of the bed.    2. Newly diagnosed cardiomyopathy with biventricular failure, LVEF approximately 20%, severe diffuse hypokinesis with akinesis of the inferior and inferolateral myocardium. Also restrictive diastolic filling pattern. This has been discussed with patient and her family. She does not have any prior known history of cardiomyopathy or definite acute change in status other than the recent week. She does have coronary artery calcifications by chest CT, so ischemic etiology is possible, but would favor nonischemic cardiomyopathy at this time. Started on low dose aldactone and Cozaar yesterday Diureses slowing. Creatinine 1.02 yesterday. CO2 22.   3. Elevated troponin I in a range most consistent with demand ischemia rather than true ACS.   4. Acute pulmonary embolus, currently off heparin with nosebleed requiring packing.   5. Epistaxis requiring packing. Taken off heparin per primary team. Patient reports history of recurrent nosebleeds over time even on aspirin. Could consider ENT consultation. Lower extremity venous Dopplers being obtained. May need to consider IVC filter if anticoagulation truly cannot be tolerated.    Phill Myron. Lawrence NP [place]  07/07/2016, 8:39 AM   Pt seen and examined  Agree with findings as noted above by Arnold Long  I  saw her on admit   Frail 82 yo COmplains of SOB with lying in bed ON exam:  Neck with increased JVP  Lungs with rales at bases  Cardiac Irreg irreg  Ext with tr edema Tele with SR and afib   Rates up with afib  I would continue PO amio 400 bid  Can supplement with IV if heart rates high (150 mg prn) Would give IV lasix if bp tolerates  Would back down on aldactone for now.   Note anticoag held with nose bleed  Nose packed   I would restart and follow    Pt critically ill   Px is very guarded  Miranda Ford

## 2016-07-08 LAB — BASIC METABOLIC PANEL
Anion gap: 8 (ref 5–15)
BUN: 22 mg/dL — ABNORMAL HIGH (ref 6–20)
CALCIUM: 8.6 mg/dL — AB (ref 8.9–10.3)
CO2: 30 mmol/L (ref 22–32)
CREATININE: 0.99 mg/dL (ref 0.44–1.00)
Chloride: 98 mmol/L — ABNORMAL LOW (ref 101–111)
GFR calc non Af Amer: 49 mL/min — ABNORMAL LOW (ref >60)
GFR, EST AFRICAN AMERICAN: 57 mL/min — AB (ref >60)
Glucose, Bld: 117 mg/dL — ABNORMAL HIGH (ref 65–99)
Potassium: 5 mmol/L (ref 3.5–5.1)
SODIUM: 136 mmol/L (ref 135–145)

## 2016-07-08 LAB — CBC
HEMATOCRIT: 40.3 % (ref 36.0–46.0)
Hemoglobin: 13 g/dL (ref 12.0–15.0)
MCH: 30.7 pg (ref 26.0–34.0)
MCHC: 32.3 g/dL (ref 30.0–36.0)
MCV: 95.3 fL (ref 78.0–100.0)
Platelets: 225 10*3/uL (ref 150–400)
RBC: 4.23 MIL/uL (ref 3.87–5.11)
RDW: 14.3 % (ref 11.5–15.5)
WBC: 9.7 10*3/uL (ref 4.0–10.5)

## 2016-07-08 MED ORDER — DILTIAZEM HCL 25 MG/5ML IV SOLN
10.0000 mg | Freq: Once | INTRAVENOUS | Status: AC
  Administered 2016-07-08: 10 mg via INTRAVENOUS
  Filled 2016-07-08: qty 5

## 2016-07-08 MED ORDER — POLYVINYL ALCOHOL 1.4 % OP SOLN
1.0000 [drp] | Freq: Two times a day (BID) | OPHTHALMIC | Status: DC
  Administered 2016-07-08 – 2016-07-15 (×14): 1 [drp] via OPHTHALMIC
  Filled 2016-07-08: qty 15

## 2016-07-08 MED ORDER — FUROSEMIDE 10 MG/ML IJ SOLN
20.0000 mg | Freq: Two times a day (BID) | INTRAMUSCULAR | Status: DC
  Administered 2016-07-08 – 2016-07-09 (×3): 20 mg via INTRAVENOUS
  Filled 2016-07-08 (×3): qty 2

## 2016-07-08 MED ORDER — DILTIAZEM HCL 100 MG IV SOLR
5.0000 mg/h | INTRAVENOUS | Status: DC
  Administered 2016-07-08: 7.5 mg/h via INTRAVENOUS
  Administered 2016-07-08: 5 mg/h via INTRAVENOUS
  Filled 2016-07-08 (×4): qty 100

## 2016-07-08 MED ORDER — SIMETHICONE 80 MG PO CHEW
80.0000 mg | CHEWABLE_TABLET | Freq: Once | ORAL | Status: AC
  Administered 2016-07-08: 80 mg via ORAL
  Filled 2016-07-08: qty 1

## 2016-07-08 MED ORDER — AMIODARONE IV BOLUS ONLY 150 MG/100ML
150.0000 mg | Freq: Once | INTRAVENOUS | Status: AC
  Administered 2016-07-08: 150 mg via INTRAVENOUS
  Filled 2016-07-08: qty 100

## 2016-07-08 MED ORDER — POLYETHYLENE GLYCOL 3350 17 G PO PACK
17.0000 g | PACK | Freq: Every day | ORAL | Status: DC
  Administered 2016-07-08 – 2016-07-14 (×4): 17 g via ORAL
  Filled 2016-07-08 (×7): qty 1

## 2016-07-08 NOTE — Progress Notes (Signed)
Called for continued elevated heart rate, order obtained from physician, medication started

## 2016-07-08 NOTE — Progress Notes (Signed)
Notified physician of heart rate, order received and completed.

## 2016-07-08 NOTE — Progress Notes (Signed)
Pt had urine mixed with stools, so I&O counts are inaccurate.

## 2016-07-08 NOTE — Progress Notes (Signed)
PROGRESS NOTE    Miranda Ford  [id number] DOB: 03/09/1924 DOA: 07/03/2016 PCP: Purvis Kilts, MD   Outpatient Specialists:   Brief Narrative:  46 yof presented with SOB found to be secondary to acute PE. On admission she was started on anticoagulation. Hospital course complicated by development of significant epistaxis requiring anterior nasal packing. Bleeding has stopped and she has been restarted on anticoagulation. She was also noted to have atrial tachycardia started on amiodarone with some improvement. Cardiology is following. ECHO with EF of 20%.  Assessment & Plan: Active Problems:   Acute pulmonary embolism (HCC)   Tachycardia   Pulmonary embolism (HCC)   Cardiomegaly   Coronary atherosclerosis of native coronary artery   Paroxysmal atrial tachycardia (HCC)   Demand ischemia (HCC)   Acute systolic CHF (congestive heart failure) (HCC)   Acute respiratory failure with hypoxia (HCC)   Secondary cardiomyopathy (HCC)   Biventricular failure (HCC)   Atrial tachycardia (HCC)   Acute congestive heart failure ([place])   Pulmonary embolism without acute cor pulmonale (HCC)   Epistaxis   1. Acute PE with evidence of right heart strain. Initially started on IV Heparin but had to be discontinued due to significant epistaxis. Ultrasound negative for DVT. She has not had further evidence of bleeding. Continue Eliquis. 2. Acute epistaxis of right nostril. Resolved. 7/12 patient with significant epistaxis from her right nostril that was noted to soak through multiple gauzes and despite applying pressure continued to bleed. Patient was transported to ED by request of Dr. Lacinda Axon for placement of rhinorocket. Bleeding has resolved and Hgb remains stable. Discussed with Dr. Redmond Baseman (ENT) in [place] who said that nasal packing can be left in place for 3-5 days. He recommended to start the patient on keflex while packing in place. He will follow the patient up next week to remove  packing. 3. Acute respiratory failure with hypoxia. Contiue to wean oxygen as tolerated.  4. Elevated troponins: Likely demand ishemia. No complaints of CP.  5. Paroxysmal atrial tachycardia: Has been started on Amiodarone. Cardiology following and ECHO results as below. Heart rate was improved yesterday, but today having episodes of tachycardia with HR in the 130-140 range. Will give additional load of amiodarone.  6. Acute systolic CHF: EF of [id number]. Bilateral pleural effusions noted by chest imaging including CT. Was noted to diuresis 1L on admission. Cardiology recommends holding off on beta blocker for now since she did not tolerate lopressor earlier. She is on aldactone and low dose losartan. Will add low dose lasix. Appreciate cardiology input.  7. Coronary atherosclerosis of native coronary artery. No complaints of chest pain. ECHO does show wall motion abnormalities and depressed EF. Further management per Cardiology   DVT prophylaxis: eliquis  Code Status:  DNR Family Communication: no family present  Disposition Plan: Discharged once improved    Consultants:   Cardiology   ENT, Redmond Baseman (phone)  Procedures:  ECHO  - Left ventricle: The cavity size was mildly dilated. Wall  thickness was increased in a pattern of mild LVH. The estimated  ejection fraction was 20%. Diffuse hypokinesis. There is akinesis  of the lateral, inferolateral, and inferior myocardium. Doppler  parameters are consistent with restrictive physiology, indicative  of decreased left ventricular diastolic compliance and/or  increased left atrial pressure. - Aortic valve: Mildly calcified annulus. Trileaflet; moderately  thickened, moderately calcified leaflets. There was mild  regurgitation. Mean gradient (S): 5 mm Hg. VTI ratio of LVOT to  aortic valve: 0.35. Valve area (VTI):  1.22 cm^2. Valve area  (Vmax): 1.43 cm^2. Suspect pseudo-stenosis in the setting of  significantly reduced LVEF. - Mitral  valve: Calcified annulus. Mildly calcified leaflets .  There was mild regurgitation. - Left atrium: The atrium was mildly dilated. - Right ventricle: Systolic function was moderately to severely  reduced. - Right atrium: Central venous pressure (est): 3 mm Hg. - Atrial septum: No defect or patent foramen ovale was identified. - Tricuspid valve: There was trivial regurgitation. - Pulmonary arteries: PA peak pressure: 20 mm Hg (S). - Pericardium, extracardiac: There was a left pleural effusion.  Rhino rocket right nostril 7/12  Antimicrobials:   Keflex 7/14>>   Subjective: Still feels short of breath. Had bowel movement yesterday.  Objective: Filed Vitals:   07/08/16 0341 07/08/16 0344 07/08/16 0400 07/08/16 0500  BP:   120/80 111/70  Pulse: 42 59 66 68  Temp:      TempSrc:      Resp: 16 15 16 21   Height:      Weight:    72.4 kg (159 lb 9.8 oz)  SpO2: 96% 98% 91% 90%    Intake/Output Summary (Last 24 hours) at 07/08/16 0554 Last data filed at 07/08/16 0523  Gross per 24 hour  Intake    480 ml  Output    550 ml  Net    -70 ml   Filed Weights   07/06/16 0500 07/07/16 0500 07/08/16 0500  Weight: 69.8 kg (153 lb 14.1 oz) 71.5 kg (157 lb 10.1 oz) 72.4 kg (159 lb 9.8 oz)    Examination:  General exam: Appears calm and comfortable, nasal packing in right nostril  Respiratory system: Crackles at bases. Respiratory effort normal. Cardiovascular system: S1 & S2 heard, RRR. No JVD, murmurs, rubs, gallops or clicks. 1+ pedal edema. Gastrointestinal system: Abdomen is nondistended, soft and nontender. No organomegaly or masses felt. Normal bowel sounds heard. Central nervous system: Alert and oriented. No focal neurological deficits. Extremities: Symmetric 5 x 5 power. Skin: No rashes, lesions or ulcers Psychiatry: Judgement and insight appear normal. Mood & affect appropriate.   Data Reviewed: I have personally reviewed following labs and imaging studies  CBC:  Recent  Labs Lab 07/04/16 0443 07/05/16 1843 07/06/16 0410 07/07/16 1015 07/08/16 0441  WBC 6.9 12.3* 9.1 11.3* 9.7  HGB 12.9 14.2 13.2 13.8 13.0  HCT 40.4 43.8 41.9 42.1 40.3  MCV 94.4 95.6 95.4 94.4 95.3  PLT 196 239 230 203 [id number]   Basic Metabolic Panel:  Recent Labs Lab 07/03/16 1145 07/04/16 0443 07/06/16 0410 07/07/16 1015  NA 135 138 136 131*  K 4.6 3.7 4.6 4.7  CL 101 103 101 94*  CO2 26 31 28 29   GLUCOSE 93 98 122* 148*  BUN 21* 18 22* 29*  CREATININE 0.78 0.85 1.02* 1.23*  CALCIUM 8.9 7.9* 8.2* 8.5*  MG 1.9  --   --   --    GFR: Estimated Creatinine Clearance: 29.9 mL/min (by C-G formula based on Cr of 1.23). Liver Function Tests:  Recent Labs Lab 07/04/16 0443  AST 33  ALT 32  ALKPHOS 93  BILITOT 0.8  PROT 5.5*  ALBUMIN 3.1*   No results for input(s): LIPASE, AMYLASE in the last 168 hours. No results for input(s): AMMONIA in the last 168 hours. Coagulation Profile:  Recent Labs Lab 07/03/16 1145 07/04/16 0443  INR 0.95 1.20   Cardiac Enzymes:  Recent Labs Lab 07/03/16 1145 07/03/16 1612 07/03/16 1738 07/03/16 2228 07/04/16 0443  TROPONINI 0.20* 0.21*  0.18* 0.20* 0.18*   Urine analysis:    Component Value Date/Time   COLORURINE YELLOW 07/03/2016 1557   APPEARANCEUR CLEAR 07/03/2016 1557   LABSPEC 1.010 07/03/2016 1557   PHURINE 5.5 07/03/2016 1557   GLUCOSEU NEGATIVE 07/03/2016 1557   HGBUR NEGATIVE 07/03/2016 1557   BILIRUBINUR NEGATIVE 07/03/2016 1557   KETONESUR NEGATIVE 07/03/2016 1557   PROTEINUR NEGATIVE 07/03/2016 1557   NITRITE NEGATIVE 07/03/2016 1557   LEUKOCYTESUR NEGATIVE 07/03/2016 1557   Sepsis Labs: @LABRCNTIP (procalcitonin:4,lacticidven:4)  ) Recent Results (from the past 240 hour(s))  MRSA PCR Screening     Status: None   Collection Time: 07/03/16  5:20 PM  Result Value Ref Range Status   MRSA by PCR NEGATIVE NEGATIVE Final    Comment:        The GeneXpert MRSA Assay (FDA approved for NASAL specimens only),  is one component of a comprehensive MRSA colonization surveillance program. It is not intended to diagnose MRSA infection nor to guide or monitor treatment for MRSA infections.     Radiology Studies: US Venous Img Lower Bilateral  07/06/2016  CLINICAL DATA:  Pulmonary embolism. History of prior pulmonary embolism. Evaluate for DVT. EXAM: BILATERAL LOWER EXTREMITY VENOUS DOPPLER ULTRASOUND TECHNIQUE: Gray-scale sonography with graded compression, as well as color Doppler and duplex ultrasound were performed to evaluate the lower extremity deep venous systems from the level of the common femoral vein and including the common femoral, femoral, profunda femoral, popliteal and calf veins including the posterior tibial, peroneal and gastrocnemius veins when visible. The superficial great saphenous vein was also interrogated. Spectral Doppler was utilized to evaluate flow at rest and with distal augmentation maneuvers in the common femoral, femoral and popliteal veins. COMPARISON:  None. FINDINGS: RIGHT LOWER EXTREMITY Common Femoral Vein: No evidence of thrombus. Normal compressibility, respiratory phasicity and response to augmentation. Saphenofemoral Junction: No evidence of thrombus. Normal compressibility and flow on color Doppler imaging. Profunda Femoral Vein: No evidence of thrombus. Normal compressibility and flow on color Doppler imaging. Femoral Vein: No evidence of thrombus. Normal compressibility, respiratory phasicity and response to augmentation. Popliteal Vein: No evidence of thrombus. Normal compressibility, respiratory phasicity and response to augmentation. Note is made of mild fusiform ectasia involving the mid aspect of the right popliteal vein (representative images 20 and 30). Calf Veins: No evidence of thrombus. Normal compressibility and flow on color Doppler imaging. Superficial Great Saphenous Vein: No evidence of thrombus. Normal compressibility and flow on color Doppler imaging.  Venous Reflux:  None. Other Findings:  None. LEFT LOWER EXTREMITY Common Femoral Vein: No evidence of thrombus. Normal compressibility, respiratory phasicity and response to augmentation. Saphenofemoral Junction: No evidence of thrombus. Normal compressibility and flow on color Doppler imaging. Profunda Femoral Vein: No evidence of thrombus. Normal compressibility and flow on color Doppler imaging. Femoral Vein: No evidence of thrombus. Normal compressibility, respiratory phasicity and response to augmentation. Popliteal Vein: No evidence of thrombus. Normal compressibility, respiratory phasicity and response to augmentation. Calf Veins: No evidence of thrombus. Normal compressibility and flow on color Doppler imaging. Superficial Great Saphenous Vein: No evidence of thrombus. Normal compressibility and flow on color Doppler imaging. Venous Reflux:  None. Other Findings:  None. IMPRESSION: No evidence of acute or chronic DVT within either lower extremity. Electronically Signed   By: Sandi Mariscal M.D.   On: 07/06/2016 14:37   Scheduled Meds: . amiodarone  400 mg Oral BID  . antiseptic oral rinse  7 mL Mouth Rinse BID  . apixaban  10 mg Oral BID  Followed by  . [START ON 07/14/2016] apixaban  5 mg Oral BID  . atorvastatin  80 mg Oral q1800  . cephALEXin  500 mg Oral Q8H  . feeding supplement (ENSURE ENLIVE)  237 mL Oral BID BM  . losartan  12.5 mg Oral Daily  . milk and molasses  1 enema Rectal Once  . sodium chloride flush  3 mL Intravenous Q12H  . spironolactone  12.5 mg Oral Daily   Continuous Infusions: . diltiazem (CARDIZEM) infusion 5 mg/hr (07/08/16 0523)     LOS: 5 days    Time spent: 25 minutes   Kathie Dike, MD [facility] If 7PM-7AM, please contact night-coverage [email or web address] Password [facility] 07/08/2016, 5:54 AM

## 2016-07-09 MED ORDER — CARVEDILOL 3.125 MG PO TABS
3.1250 mg | ORAL_TABLET | Freq: Two times a day (BID) | ORAL | Status: DC
  Administered 2016-07-09 – 2016-07-14 (×9): 3.125 mg via ORAL
  Filled 2016-07-09 (×13): qty 1

## 2016-07-09 MED ORDER — SODIUM CHLORIDE 0.9 % IV BOLUS (SEPSIS)
250.0000 mL | Freq: Once | INTRAVENOUS | Status: AC
  Administered 2016-07-09: 250 mL via INTRAVENOUS

## 2016-07-09 MED ORDER — ONDANSETRON HCL 4 MG/2ML IJ SOLN
4.0000 mg | Freq: Four times a day (QID) | INTRAMUSCULAR | Status: DC | PRN
  Administered 2016-07-09: 4 mg via INTRAVENOUS
  Filled 2016-07-09: qty 2

## 2016-07-09 MED ORDER — FUROSEMIDE 10 MG/ML IJ SOLN
40.0000 mg | Freq: Two times a day (BID) | INTRAMUSCULAR | Status: DC
  Administered 2016-07-09 – 2016-07-10 (×3): 40 mg via INTRAVENOUS
  Filled 2016-07-09 (×3): qty 4

## 2016-07-09 MED ORDER — ALPRAZOLAM 0.5 MG PO TABS
0.5000 mg | ORAL_TABLET | Freq: Two times a day (BID) | ORAL | Status: DC | PRN
  Administered 2016-07-09: 0.5 mg via ORAL
  Filled 2016-07-09: qty 1

## 2016-07-09 NOTE — Progress Notes (Signed)
PROGRESS NOTE    Miranda Ford  [id number] DOB: 06-06-1924 DOA: 07/03/2016 PCP: Purvis Kilts, MD   Outpatient Specialists:   Brief Narrative:  54 yof presented with SOB found to be secondary to acute PE. On admission she was started on anticoagulation. Hospital course complicated by development of significant epistaxis requiring anterior nasal packing. Bleeding has stopped and she has been restarted on anticoagulation. She was also noted to have atrial tachycardia started on amiodarone with some improvement. Cardiology is following. ECHO with EF of 20%.  Assessment & Plan: Active Problems:   Acute pulmonary embolism (HCC)   Tachycardia   Pulmonary embolism (HCC)   Cardiomegaly   Coronary atherosclerosis of native coronary artery   Paroxysmal atrial tachycardia (HCC)   Demand ischemia (HCC)   Acute systolic CHF (congestive heart failure) (HCC)   Acute respiratory failure with hypoxia (HCC)   Secondary cardiomyopathy (HCC)   Biventricular failure (HCC)   Atrial tachycardia (HCC)   Acute congestive heart failure ([place])   Pulmonary embolism without acute cor pulmonale (HCC)   Epistaxis   1. Acute PE with evidence of right heart strain. Initially started on IV Heparin but had to be discontinued due to significant epistaxis. Ultrasound of LE negative for DVT bilaterally. She has not had further evidence of bleeding. Continue Eliquis.  2. Acute epistaxis of right nostril. Resolved. 7/12 patient with significant epistaxis from her right nostril that was noted to soak through multiple gauzes and despite applying pressure continued to bleed. Patient was transported to ED by request of Dr. Lacinda Axon for placement of rhinorocket. Bleeding has resolved and Hgb remains stable. Discussed with Dr. Redmond Baseman (ENT) in [place] who said that nasal packing can be left in place for 3-5 days. He recommended to start the patient on keflex while packing in place. He will follow the patient up next week to  remove packing. 3. Acute respiratory failure with hypoxia. Contiue to wean oxygen as tolerated.  4. Elevated troponins: Likely demand ishemia. No complaints of CP.  5. Paroxysmal atrial tachycardia: Anticipate some improvement with continued diuresis. Cardiology following and ECHO results as below. Continue amiodarone. Add coreg 6. Acute systolic CHF: EF of 123456. Bilateral pleural effusions noted by chest imaging including CT. Cardiology input appreciated. Will add Coreg. She is on aldactone and low dose losartan. Increase lasix. Appreciate cardiology input.  7. Coronary atherosclerosis of native coronary artery. No complaints of chest pain. ECHO does show wall motion abnormalities and depressed EF. Further management per Cardiology   DVT prophylaxis: eliquis  Code Status:  DNR Family Communication: no family present  Disposition Plan: Discharged once improved    Consultants:   Cardiology   ENT, Redmond Baseman (phone)  Procedures:  ECHO  - Left ventricle: The cavity size was mildly dilated. Wall  thickness was increased in a pattern of mild LVH. The estimated  ejection fraction was 20%. Diffuse hypokinesis. There is akinesis  of the lateral, inferolateral, and inferior myocardium. Doppler  parameters are consistent with restrictive physiology, indicative  of decreased left ventricular diastolic compliance and/or  increased left atrial pressure. - Aortic valve: Mildly calcified annulus. Trileaflet; moderately  thickened, moderately calcified leaflets. There was mild  regurgitation. Mean gradient (S): 5 mm Hg. VTI ratio of LVOT to  aortic valve: 0.35. Valve area (VTI): 1.22 cm^2. Valve area  (Vmax): 1.43 cm^2. Suspect pseudo-stenosis in the setting of  significantly reduced LVEF. - Mitral valve: Calcified annulus. Mildly calcified leaflets .  There was mild regurgitation. - Left atrium:  The atrium was mildly dilated. - Right ventricle: Systolic function was moderately to  severely  reduced. - Right atrium: Central venous pressure (est): 3 mm Hg. - Atrial septum: No defect or patent foramen ovale was identified. - Tricuspid valve: There was trivial regurgitation. - Pulmonary arteries: PA peak pressure: 20 mm Hg (S). - Pericardium, extracardiac: There was a left pleural effusion.  Rhino rocket right nostril 7/12  Antimicrobials:   Keflex 7/14>>   Subjective: Having normal BM's, did not need enema yesterday. Not urinating much. Breathing roughly the same. Reports decreased appetite.  Objective: Filed Vitals:   07/09/16 0100 07/09/16 0300 07/09/16 0400 07/09/16 0500  BP: 108/72 124/71 130/73 121/86  Pulse: 70 70 73 78  Temp:   96.5 F (35.8 C)   TempSrc:   Axillary   Resp: 21 22 24 19   Height:      Weight:    72.4 kg (159 lb 9.8 oz)  SpO2: 89% 93% 96% 91%    Intake/Output Summary (Last 24 hours) at 07/09/16 0614 Last data filed at 07/08/16 2300  Gross per 24 hour  Intake 567.62 ml  Output      0 ml  Net 567.62 ml   Filed Weights   07/07/16 0500 07/08/16 0500 07/09/16 0500  Weight: 71.5 kg (157 lb 10.1 oz) 72.4 kg (159 lb 9.8 oz) 72.4 kg (159 lb 9.8 oz)    Examination:  General exam: Appears calm and comfortable  Respiratory system: Diminished breath sounds at bases. Cardiovascular system: S1 & S2 heard, irregular heart rate. No JVD, murmurs, rubs, gallops or clicks. 1+ pedal edema. Gastrointestinal system: Abdomen is nondistended, soft and nontender. No organomegaly or masses felt. Normal bowel sounds heard. Central nervous system: Alert and oriented. No focal neurological deficits. Extremities: Symmetric 5 x 5 power. Skin: No rashes, lesions or ulcers Psychiatry: Judgement and insight appear normal. Mood & affect appropriate.   Data Reviewed: I have personally reviewed following labs and imaging studies  CBC:  Recent Labs Lab 07/04/16 0443 07/05/16 1843 07/06/16 0410 07/07/16 1015 07/08/16 0441  WBC 6.9 12.3* 9.1 11.3*  9.7  HGB 12.9 14.2 13.2 13.8 13.0  HCT 40.4 43.8 41.9 42.1 40.3  MCV 94.4 95.6 95.4 94.4 95.3  PLT 196 239 230 203 [id number]   Basic Metabolic Panel:  Recent Labs Lab 07/03/16 1145 07/04/16 0443 07/06/16 0410 07/07/16 1015 07/08/16 0441  NA 135 138 136 131* 136  K 4.6 3.7 4.6 4.7 5.0  CL 101 103 101 94* 98*  CO2 26 31 28 29 30   GLUCOSE 93 98 122* 148* 117*  BUN 21* 18 22* 29* 22*  CREATININE 0.78 0.85 1.02* 1.23* 0.99  CALCIUM 8.9 7.9* 8.2* 8.5* 8.6*  MG 1.9  --   --   --   --    GFR: Estimated Creatinine Clearance: 37.2 mL/min (by C-G formula based on Cr of 0.99). Liver Function Tests:  Recent Labs Lab 07/04/16 0443  AST 33  ALT 32  ALKPHOS 93  BILITOT 0.8  PROT 5.5*  ALBUMIN 3.1*   Coagulation Profile:  Recent Labs Lab 07/03/16 1145 07/04/16 0443  INR 0.95 1.20   Cardiac Enzymes:  Recent Labs Lab 07/03/16 1145 07/03/16 1612 07/03/16 1738 07/03/16 2228 07/04/16 0443  TROPONINI 0.20* 0.21* 0.18* 0.20* 0.18*   Urine analysis:    Component Value Date/Time   COLORURINE YELLOW 07/03/2016 1557   APPEARANCEUR CLEAR 07/03/2016 1557   LABSPEC 1.010 07/03/2016 1557   PHURINE 5.5 07/03/2016 1557  GLUCOSEU NEGATIVE 07/03/2016 1557   HGBUR NEGATIVE 07/03/2016 [place] 07/03/2016 1557   [place] 07/03/2016 1557   PROTEINUR NEGATIVE 07/03/2016 1557   NITRITE NEGATIVE 07/03/2016 1557   LEUKOCYTESUR NEGATIVE 07/03/2016 1557   Sepsis Labs: @LABRCNTIP (procalcitonin:4,lacticidven:4)  ) Recent Results (from the past 240 hour(s))  MRSA PCR Screening     Status: None   Collection Time: 07/03/16  5:20 PM  Result Value Ref Range Status   MRSA by PCR NEGATIVE NEGATIVE Final    Comment:        The GeneXpert MRSA Assay (FDA approved for NASAL specimens only), is one component of a comprehensive MRSA colonization surveillance program. It is not intended to diagnose MRSA infection nor to guide or monitor treatment for MRSA  infections.     Radiology Studies: No results found. Scheduled Meds: . amiodarone  400 mg Oral BID  . antiseptic oral rinse  7 mL Mouth Rinse BID  . apixaban  10 mg Oral BID   Followed by  . [START ON 07/14/2016] apixaban  5 mg Oral BID  . atorvastatin  80 mg Oral q1800  . cephALEXin  500 mg Oral Q8H  . feeding supplement (ENSURE ENLIVE)  237 mL Oral BID BM  . furosemide  20 mg Intravenous BID  . losartan  12.5 mg Oral Daily  . milk and molasses  1 enema Rectal Once  . polyethylene glycol  17 g Oral Daily  . polyvinyl alcohol  1 drop Both Eyes BID  . sodium chloride flush  3 mL Intravenous Q12H  . spironolactone  12.5 mg Oral Daily   Continuous Infusions: . diltiazem (CARDIZEM) infusion 7.5 mg/hr (07/08/16 2300)     LOS: 6 days    Time spent: 25 minutes   Kathie Dike, MD [facility] If 7PM-7AM, please contact night-coverage [email or web address] Password [email or web address] 07/09/2016, 6:14 AM   By signing my name below, I, Delene Ruffini, attest that this documentation has been prepared under the direction and in the presence of Kathie Dike, MD. Electronically Signed: Delene Ruffini 07/09/2016 8:32am   I, Dr. Kathie Dike, personally performed the services described in this documentaiton. All medical record entries made by the scribe were at my direction and in my presence. I have reviewed the chart and agree that the record reflects my personal performance and is accurate and complete  Kathie Dike, MD, 07/09/2016 9:03 AM

## 2016-07-10 LAB — BASIC METABOLIC PANEL
ANION GAP: 9 (ref 5–15)
BUN: 29 mg/dL — ABNORMAL HIGH (ref 6–20)
CALCIUM: 8.4 mg/dL — AB (ref 8.9–10.3)
CO2: 31 mmol/L (ref 22–32)
Chloride: 92 mmol/L — ABNORMAL LOW (ref 101–111)
Creatinine, Ser: 0.98 mg/dL (ref 0.44–1.00)
GFR calc Af Amer: 58 mL/min — ABNORMAL LOW (ref >60)
GFR, EST NON AFRICAN AMERICAN: 50 mL/min — AB (ref >60)
GLUCOSE: 104 mg/dL — AB (ref 65–99)
POTASSIUM: 4.7 mmol/L (ref 3.5–5.1)
SODIUM: 132 mmol/L — AB (ref 135–145)

## 2016-07-10 NOTE — Progress Notes (Signed)
PROGRESS NOTE    Miranda Ford  [id number] DOB: [birth year] DOA: 07/03/2016 PCP: Purvis Kilts, MD   Outpatient Specialists:   Brief Narrative:  7 yof presented with SOB found to be secondary to acute PE. On admission she was started on anticoagulation. Hospital course complicated by development of significant epistaxis requiring anterior nasal packing. Bleeding has stopped and she has been restarted on anticoagulation. She was also noted to have atrial tachycardia started on amiodarone with some improvement. Cardiology is following. ECHO with EF of 20%.  Assessment & Plan: Active Problems:   Acute pulmonary embolism (HCC)   Tachycardia   Pulmonary embolism (HCC)   Cardiomegaly   Coronary atherosclerosis of native coronary artery   Paroxysmal atrial tachycardia (HCC)   Demand ischemia (HCC)   Acute systolic CHF (congestive heart failure) (HCC)   Acute respiratory failure with hypoxia (HCC)   Secondary cardiomyopathy (HCC)   Biventricular failure (HCC)   Atrial tachycardia (HCC)   Acute congestive heart failure ([place])   Pulmonary embolism without acute cor pulmonale (HCC)   Epistaxis   1. Acute PE with evidence of right heart strain. Initially started on IV Heparin but had to be discontinued due to significant epistaxis. Ultrasound of LE negative for DVT bilaterally. She has not had further evidence of bleeding. Continue Eliquis.  2. Acute epistaxis of right nostril. Resolved. 7/12 patient with significant epistaxis from her right nostril that was noted to soak through multiple gauzes and despite applying pressure continued to bleed. Patient was transported to ED by request of Dr. Lacinda Axon for placement of rhinorocket. Bleeding has resolved and Hgb remains stable. Discussed with Dr. Redmond Baseman (ENT) in [place] who said that nasal packing can be left in place for 3-5 days. He recommended to start the patient on keflex while packing in place. He will follow the patient up after  discharge to remove packing. 3. Acute respiratory failure with hypoxia. Contiue to wean oxygen as tolerated.  4. Elevated troponins: Likely demand ishemia. No complaints of CP.  5. Paroxysmal atrial tachycardia: Anticipate some improvement with continued diuresis. Cardiology following and ECHO results as below. Continue amiodarone. Continue coreg 6. Acute systolic CHF: EF of 123456. Bilateral pleural effusions noted by chest imaging including CT. Cardiology input appreciated. Continue Coreg. She is on aldactone and low dose losartan. Continue lasix. Appreciate cardiology input.  7. Coronary atherosclerosis of native coronary artery. No complaints of chest pain. ECHO does show wall motion abnormalities and depressed EF. Further management per Cardiology   DVT prophylaxis: eliquis  Code Status:  DNR Family Communication: no family present  Disposition Plan: Discharged once improved    Consultants:   Cardiology   ENT, Redmond Baseman (phone)  Procedures:  ECHO  - Left ventricle: The cavity size was mildly dilated. Wall  thickness was increased in a pattern of mild LVH. The estimated  ejection fraction was 20%. Diffuse hypokinesis. There is akinesis  of the lateral, inferolateral, and inferior myocardium. Doppler  parameters are consistent with restrictive physiology, indicative  of decreased left ventricular diastolic compliance and/or  increased left atrial pressure. - Aortic valve: Mildly calcified annulus. Trileaflet; moderately  thickened, moderately calcified leaflets. There was mild  regurgitation. Mean gradient (S): 5 mm Hg. VTI ratio of LVOT to  aortic valve: 0.35. Valve area (VTI): 1.22 cm^2. Valve area  (Vmax): 1.43 cm^2. Suspect pseudo-stenosis in the setting of  significantly reduced LVEF. - Mitral valve: Calcified annulus. Mildly calcified leaflets .  There was mild regurgitation. - Left atrium: The  atrium was mildly dilated. - Right ventricle: Systolic function was  moderately to severely  reduced. - Right atrium: Central venous pressure (est): 3 mm Hg. - Atrial septum: No defect or patent foramen ovale was identified. - Tricuspid valve: There was trivial regurgitation. - Pulmonary arteries: PA peak pressure: 20 mm Hg (S). - Pericardium, extracardiac: There was a left pleural effusion.  Rhino rocket right nostril 7/12  Antimicrobials:   Keflex 7/14>>  Subjective: Feeling better today. Shortness of breath improving. Had increasing oxygen requirement overnight. Now on 6L HFNC. No cough.  Objective: Filed Vitals:   07/10/16 0000 07/10/16 0100 07/10/16 0200 07/10/16 0300  BP: 86/55 90/53 104/66 100/63  Pulse: 58 57 61 57  Temp:      TempSrc:      Resp: 21 15 20 14   Height:      Weight:      SpO2: 95% 93% 93% 91%    Intake/Output Summary (Last 24 hours) at 07/10/16 0551 Last data filed at 07/10/16 0100  Gross per 24 hour  Intake  443.5 ml  Output    925 ml  Net -481.5 ml   Filed Weights   07/07/16 0500 07/08/16 0500 07/09/16 0500  Weight: 71.5 kg (157 lb 10.1 oz) 72.4 kg (159 lb 9.8 oz) 72.4 kg (159 lb 9.8 oz)    Examination:  General exam: Appears calm and comfortable, nasal packing in right nostril  Respiratory system: Crackles bilaterally. Respiratory effort normal. Cardiovascular system: S1 & S2 heard, RRR. No JVD, murmurs, rubs, gallops or clicks. 1+ pedal edema. Gastrointestinal system: Abdomen is nondistended, soft and nontender. No organomegaly or masses felt. Normal bowel sounds heard. Central nervous system: Alert and oriented. No focal neurological deficits. Extremities: Symmetric 5 x 5 power. Skin: No rashes, lesions or ulcers Psychiatry: Judgement and insight appear normal. Mood & affect appropriate.   Data Reviewed: I have personally reviewed following labs and imaging studies  CBC:  Recent Labs Lab 07/04/16 0443 07/05/16 1843 07/06/16 0410 07/07/16 1015 07/08/16 0441  WBC 6.9 12.3* 9.1 11.3* 9.7  HGB  12.9 14.2 13.2 13.8 13.0  HCT 40.4 43.8 41.9 42.1 40.3  MCV 94.4 95.6 95.4 94.4 95.3  PLT 196 239 230 203 [id number]   Basic Metabolic Panel:  Recent Labs Lab 07/03/16 1145 07/04/16 0443 07/06/16 0410 07/07/16 1015 07/08/16 0441  NA 135 138 136 131* 136  K 4.6 3.7 4.6 4.7 5.0  CL 101 103 101 94* 98*  CO2 26 31 28 29 30   GLUCOSE 93 98 122* 148* 117*  BUN 21* 18 22* 29* 22*  CREATININE 0.78 0.85 1.02* 1.23* 0.99  CALCIUM 8.9 7.9* 8.2* 8.5* 8.6*  MG 1.9  --   --   --   --    GFR: Estimated Creatinine Clearance: 37.2 mL/min (by C-G formula based on Cr of 0.99). Liver Function Tests:  Recent Labs Lab 07/04/16 0443  AST 33  ALT 32  ALKPHOS 93  BILITOT 0.8  PROT 5.5*  ALBUMIN 3.1*   Coagulation Profile:  Recent Labs Lab 07/03/16 1145 07/04/16 0443  INR 0.95 1.20   Cardiac Enzymes:  Recent Labs Lab 07/03/16 1145 07/03/16 1612 07/03/16 1738 07/03/16 2228 07/04/16 0443  TROPONINI 0.20* 0.21* 0.18* 0.20* 0.18*   Urine analysis:    Component Value Date/Time   COLORURINE YELLOW 07/03/2016 [place] 07/03/2016 1557   LABSPEC 1.010 07/03/2016 1557   PHURINE 5.5 07/03/2016 1557   GLUCOSEU NEGATIVE 07/03/2016 1557   HGBUR NEGATIVE  07/03/2016 [place] 07/03/2016 [place] 07/03/2016 1557   PROTEINUR NEGATIVE 07/03/2016 1557   NITRITE NEGATIVE 07/03/2016 1557   LEUKOCYTESUR NEGATIVE 07/03/2016 1557   Sepsis Labs: @LABRCNTIP (procalcitonin:4,lacticidven:4)  ) Recent Results (from the past 240 hour(s))  MRSA PCR Screening     Status: None   Collection Time: 07/03/16  5:20 PM  Result Value Ref Range Status   MRSA by PCR NEGATIVE NEGATIVE Final    Comment:        The GeneXpert MRSA Assay (FDA approved for NASAL specimens only), is one component of a comprehensive MRSA colonization surveillance program. It is not intended to diagnose MRSA infection nor to guide or monitor treatment for MRSA infections.       Radiology Studies: No results found. Scheduled Meds: . amiodarone  400 mg Oral BID  . antiseptic oral rinse  7 mL Mouth Rinse BID  . apixaban  10 mg Oral BID   Followed by  . [START ON 07/14/2016] apixaban  5 mg Oral BID  . atorvastatin  80 mg Oral q1800  . carvedilol  3.125 mg Oral BID WC  . cephALEXin  500 mg Oral Q8H  . feeding supplement (ENSURE ENLIVE)  237 mL Oral BID BM  . furosemide  40 mg Intravenous BID  . losartan  12.5 mg Oral Daily  . milk and molasses  1 enema Rectal Once  . polyethylene glycol  17 g Oral Daily  . polyvinyl alcohol  1 drop Both Eyes BID  . sodium chloride flush  3 mL Intravenous Q12H  . spironolactone  12.5 mg Oral Daily   Continuous Infusions: . diltiazem (CARDIZEM) infusion Stopped (07/09/16 2204)     LOS: 7 days    Time spent: 25 minutes   Kathie Dike, MD [facility] If 7PM-7AM, please contact night-coverage [email or web address] Password [facility] 07/10/2016, 5:51 AM

## 2016-07-10 NOTE — Progress Notes (Addendum)
Primary Cardiologist:Ross, Nevin Bloodgood MD  Cardiology Specific Problem List: 1. Atrial fibrillation 2. Cardiomyopathy with Biventricular Failure 3. Demand ischemia  4. PE  Subjective:   No significant palpitations     Objective:   Temp:  [96.6 F (35.9 C)-98.4 F (36.9 C)] 97.1 F (36.2 C) (07/17 0756) Pulse Rate:  [57-80] 76 (07/17 0756) Resp:  [14-27] 19 (07/17 0700) BP: (71-115)/(29-89) 106/63 mmHg (07/17 0756) SpO2:  [85 %-97 %] 94 % (07/17 0700) Weight:  [143 lb 11.8 oz (65.2 kg)] 143 lb 11.8 oz (65.2 kg) (07/17 0500) Last BM Date: 07/09/16  Filed Weights   07/08/16 0500 07/09/16 0500 07/10/16 0500  Weight: 159 lb 9.8 oz (72.4 kg) 159 lb 9.8 oz (72.4 kg) 143 lb 11.8 oz (65.2 kg)    Intake/Output Summary (Last 24 hours) at 07/10/16 1005 Last data filed at 07/10/16 0903  Gross per 24 hour  Intake    616 ml  Output   1475 ml  Net   -859 ml    Telemetry: SR with frequent PAC;s.   Exam:  General: No acute distress.  HEENT: Conjunctiva and lids normal, oropharynx clear. Nasal packing to the right nares.   Lungs: Clear to auscultation, nonlabored.  Cardiac: No elevated JVP or bruits. IRRR, no gallop or rub.   Abdomen: Normoactive bowel sounds, nontender, nondistended.  Extremities: No pitting edema, distal pulses full.  Neuropsychiatric: Alert and oriented x3, affect appropriate.   Lab Results:  Basic Metabolic Panel:  Recent Labs Lab 07/03/16 1145  07/07/16 1015 07/08/16 0441 07/10/16 0429  NA 135  < > 131* 136 132*  K 4.6  < > 4.7 5.0 4.7  CL 101  < > 94* 98* 92*  CO2 26  < > 29 30 31   GLUCOSE 93  < > 148* 117* 104*  BUN 21*  < > 29* 22* 29*  CREATININE 0.78  < > 1.23* 0.99 0.98  CALCIUM 8.9  < > 8.5* 8.6* 8.4*  MG 1.9  --   --   --   --   < > = values in this interval not displayed.  Liver Function Tests:  Recent Labs Lab 07/04/16 0443  AST 33  ALT 32  ALKPHOS 93  BILITOT 0.8  PROT 5.5*  ALBUMIN 3.1*    CBC:  Recent  Labs Lab 07/06/16 0410 07/07/16 1015 07/08/16 0441  WBC 9.1 11.3* 9.7  HGB 13.2 13.8 13.0  HCT 41.9 42.1 40.3  MCV 95.4 94.4 95.3  PLT 230 203 225    Cardiac Enzymes:  Recent Labs Lab 07/03/16 1738 07/03/16 2228 07/04/16 0443  TROPONINI 0.18* 0.20* 0.18*    Coagulation:  Recent Labs Lab 07/03/16 1145 07/04/16 0443  INR 0.95 1.20   Study Conclusions  07/04/2016 - Left ventricle: The cavity size was mildly dilated. Wall  thickness was increased in a pattern of mild LVH. The estimated  ejection fraction was 20%. Diffuse hypokinesis. There is akinesis  of the lateral, inferolateral, and inferior myocardium. Doppler  parameters are consistent with restrictive physiology, indicative  of decreased left ventricular diastolic compliance and/or  increased left atrial pressure. - Aortic valve: Mildly calcified annulus. Trileaflet; moderately  thickened, moderately calcified leaflets. There was mild  regurgitation. Mean gradient (S): 5 mm Hg. VTI ratio of LVOT to  aortic valve: 0.35. Valve area (VTI): 1.22 cm^2. Valve area  (Vmax): 1.43 cm^2. Suspect pseudo-stenosis in the setting of  significantly reduced LVEF. - Mitral valve: Calcified annulus. Mildly calcified leaflets .  There was mild regurgitation. - Left atrium: The atrium was mildly dilated. - Right ventricle: Systolic function was moderately to severely  reduced. - Right atrium: Central venous pressure (est): 3 mm Hg. - Atrial septum: No defect or patent foramen ovale was identified. - Tricuspid valve: There was trivial regurgitation. - Pulmonary arteries: PA peak pressure: 20 mm Hg (S). - Pericardium, extracardiac: There was a left pleural effusion.  Medications:   Scheduled Medications: . amiodarone  400 mg Oral BID  . antiseptic oral rinse  7 mL Mouth Rinse BID  . apixaban  10 mg Oral BID   Followed by  . [START ON 07/14/2016] apixaban  5 mg Oral BID  . atorvastatin  80 mg Oral q1800  .  carvedilol  3.125 mg Oral BID WC  . cephALEXin  500 mg Oral Q8H  . feeding supplement (ENSURE ENLIVE)  237 mL Oral BID BM  . furosemide  40 mg Intravenous BID  . losartan  12.5 mg Oral Daily  . milk and molasses  1 enema Rectal Once  . polyethylene glycol  17 g Oral Daily  . polyvinyl alcohol  1 drop Both Eyes BID  . sodium chloride flush  3 mL Intravenous Q12H  . spironolactone  12.5 mg Oral Daily    Infusions: . diltiazem (CARDIZEM) infusion Stopped (07/09/16 2204)    PRN Medications: acetaminophen **OR** acetaminophen, ALPRAZolam, magnesium hydroxide, metoprolol, ondansetron (ZOFRAN) IV, polyvinyl alcohol, sodium chloride   Assessment and Plan:  1. Atrial tachycardia: Now in NSR with frequent PAC's. She continues now on Eliquis with no active bleeding now. Continues on amiodarone 400 mg BID. Has been on this dose since 07/05/2016, with two IV bolus of 150 mg on 07/08/2016. She is also continued on carvedilol 3.125 mg BID. HR is now well controlled. Feeling fatigued. May need some PT.   2. Cardiomyopathy: Echo this admission demonstrated EF of 20%. iffuse hypokinesis. There is akinesis  of the lateral, inferolateral, and inferior myocardium. . Continue ARB, BB, lasix and spironolactone. Creatinine is 0.98. She has diuresed 2.297 liters since admission.   3. PE: Was started on heparin infusion but stopped in the setting of epistaxis requiring nasal packing on the right. This remains and will not be removed until follow up with ENT after discharge. She has been restarted on Eliquis po.    Phill Myron. Lawrence NP [place]  07/10/2016, 10:05 AM  Attending Note Patient seen and discussed with NP Purcell Nails, I agree with her documentation above. Has been started on amiodarone for atach this admission with good response. Newly diagnosed heart failure with LV systolic function [id number], restrictive LV diastolic dysfunction, and moderate to severe RV dysfunction. Medical therapy for CHF has been limited  by soft bp's, however currently on low dose coreg, losartan, aldactone. Was on dilt gtt yesterday for elevated rates, now off this AM. She is on anticoag for PE this admit. CT also showed moderate bilateral pleural effusinos, she is on IV lasix with reported negative I/Os of 500 mL yesterday and 2.3 liters since admission. Remains volume overloaded, continue IV lasix. Would not plan for invasive cardiac evaluation for her given advanced comorbidities, continue mediclal therapy.    Zandra Abts MD

## 2016-07-11 ENCOUNTER — Inpatient Hospital Stay (HOSPITAL_COMMUNITY): Payer: Medicare Other

## 2016-07-11 ENCOUNTER — Encounter (HOSPITAL_COMMUNITY): Payer: Self-pay | Admitting: Primary Care

## 2016-07-11 DIAGNOSIS — Z515 Encounter for palliative care: Secondary | ICD-10-CM

## 2016-07-11 DIAGNOSIS — Z7189 Other specified counseling: Secondary | ICD-10-CM

## 2016-07-11 LAB — CBC
HCT: 40.7 % (ref 36.0–46.0)
HEMOGLOBIN: 13 g/dL (ref 12.0–15.0)
MCH: 30.2 pg (ref 26.0–34.0)
MCHC: 31.9 g/dL (ref 30.0–36.0)
MCV: 94.7 fL (ref 78.0–100.0)
Platelets: 261 10*3/uL (ref 150–400)
RBC: 4.3 MIL/uL (ref 3.87–5.11)
RDW: 14.1 % (ref 11.5–15.5)
WBC: 8.2 10*3/uL (ref 4.0–10.5)

## 2016-07-11 MED ORDER — FUROSEMIDE 10 MG/ML IJ SOLN
40.0000 mg | Freq: Every day | INTRAMUSCULAR | Status: DC
  Administered 2016-07-11 – 2016-07-15 (×5): 40 mg via INTRAVENOUS
  Filled 2016-07-11 (×5): qty 4

## 2016-07-11 MED ORDER — FUROSEMIDE 10 MG/ML IJ SOLN: 40.0000 mg | Freq: Every day | INTRAMUSCULAR | Status: DC

## 2016-07-11 MED ORDER — AMIODARONE HCL 200 MG PO TABS
200.0000 mg | ORAL_TABLET | Freq: Two times a day (BID) | ORAL | Status: DC
  Administered 2016-07-11 – 2016-07-14 (×8): 200 mg via ORAL
  Filled 2016-07-11 (×9): qty 1

## 2016-07-11 NOTE — Progress Notes (Signed)
Consulting cardiologist: Dr. Dorris Carnes  Seen for followup: Atrial tachycardia, cardiomyopathy  Subjective:    Patient states appetite is fair. No chest pain or palpitations. Feels weak. No further nosebleeds with packing in place.  Objective:   Temp:  [96.9 F (36.1 C)-98 F (36.7 C)] 97.5 F (36.4 C) (07/18 0747) Pulse Rate:  [29-109] 56 (07/18 0600) Resp:  [13-26] 13 (07/18 0600) BP: (82-131)/(53-81) 106/64 mmHg (07/18 0600) SpO2:  [70 %-99 %] 90 % (07/18 0600) Weight:  [145 lb 1 oz (65.8 kg)] 145 lb 1 oz (65.8 kg) (07/18 0500) Last BM Date: 07/09/16  Filed Weights   07/09/16 0500 07/10/16 0500 07/11/16 0500  Weight: 159 lb 9.8 oz (72.4 kg) 143 lb 11.8 oz (65.2 kg) 145 lb 1 oz (65.8 kg)    Intake/Output Summary (Last 24 hours) at 07/11/16 0815 Last data filed at 07/11/16 0600  Gross per 24 hour  Intake    920 ml  Output   2850 ml  Net  -1930 ml    Telemetry: Sinus rhythm with brief bursts of atrial tachycardia and PACs.   Exam:  General: Frail appearing elderly woman in no distress.  HEENT: Nasal packing in place.  Lungs: Diminished breath sounds at the bases.  Cardiac: RRR with ectopy, indistinct PMI and apical systolic murmur.  Abdomen: NABS.  Extremities: No pitting edema.  Lab Results:  Basic Metabolic Panel:  Recent Labs Lab 07/07/16 1015 07/08/16 0441 07/10/16 0429  NA 131* 136 132*  K 4.7 5.0 4.7  CL 94* 98* 92*  CO2 29 30 31   GLUCOSE 148* 117* 104*  BUN 29* 22* 29*  CREATININE 1.23* 0.99 0.98  CALCIUM 8.5* 8.6* 8.4*    CBC:  Recent Labs Lab 07/07/16 1015 07/08/16 0441 07/11/16 0403  WBC 11.3* 9.7 8.2  HGB 13.8 13.0 13.0  HCT 42.1 40.3 40.7  MCV 94.4 95.3 94.7  PLT 203 225 261    Echocardiogram 07/04/2016: Study Conclusions  - Left ventricle: The cavity size was mildly dilated. Wall  thickness was increased in a pattern of mild LVH. The estimated  ejection fraction was 20%. Diffuse hypokinesis. There is  akinesis  of the lateral, inferolateral, and inferior myocardium. Doppler  parameters are consistent with restrictive physiology, indicative  of decreased left ventricular diastolic compliance and/or  increased left atrial pressure. - Aortic valve: Mildly calcified annulus. Trileaflet; moderately  thickened, moderately calcified leaflets. There was mild  regurgitation. Mean gradient (S): 5 mm Hg. VTI ratio of LVOT to  aortic valve: 0.35. Valve area (VTI): 1.22 cm^2. Valve area  (Vmax): 1.43 cm^2. Suspect pseudo-stenosis in the setting of  significantly reduced LVEF. - Mitral valve: Calcified annulus. Mildly calcified leaflets .  There was mild regurgitation. - Left atrium: The atrium was mildly dilated. - Right ventricle: Systolic function was moderately to severely  reduced. - Right atrium: Central venous pressure (est): 3 mm Hg. - Atrial septum: No defect or patent foramen ovale was identified. - Tricuspid valve: There was trivial regurgitation. - Pulmonary arteries: PA peak pressure: 20 mm Hg (S). - Pericardium, extracardiac: There was a left pleural effusion.  Impressions:  - Mildly dilated LV chamber size with mild LVH and LVEF  approximately 20%. There is severe diffuse hypokinesis with  akinesis of the inferior and inferolateral myocardium. Probable  restrictive diastolic filling pattern. MAC with calcified mitral  leaflets and mild mitral regurgitation. Mild left atrial  enlargement. Moderately calcified and thickened aortic valve with  reduced cusp excursion and  mild aortic regurgitation. Suspect  pseudo-stenosis in the setting of reduced LVEF. Moderately to  severely reduced RV contraction. Trivial tricuspid regurgitation  with PASP estimated 21 mmHg. Left pleural effusion noted.   Medications:   Scheduled Medications: . amiodarone  400 mg Oral BID  . antiseptic oral rinse  7 mL Mouth Rinse BID  . apixaban  10 mg Oral BID   Followed by  .  [START ON 07/14/2016] apixaban  5 mg Oral BID  . atorvastatin  80 mg Oral q1800  . carvedilol  3.125 mg Oral BID WC  . cephALEXin  500 mg Oral Q8H  . feeding supplement (ENSURE ENLIVE)  237 mL Oral BID BM  . furosemide  40 mg Intravenous BID  . losartan  12.5 mg Oral Daily  . milk and molasses  1 enema Rectal Once  . polyethylene glycol  17 g Oral Daily  . polyvinyl alcohol  1 drop Both Eyes BID  . sodium chloride flush  3 mL Intravenous Q12H  . spironolactone  12.5 mg Oral Daily     PRN Medications:  acetaminophen **OR** acetaminophen, ALPRAZolam, magnesium hydroxide, metoprolol, ondansetron (ZOFRAN) IV, polyvinyl alcohol, sodium chloride   Assessment:   1. Paroxysmal atrial tachycardia. Rhythm remains overall controlled with bursts of atrial tachycardia that are brief and PACs. She has been on amiodarone at 400 mg twice daily since July 12.  2. Newly diagnosed cardiomyopathy with biventricular failure, LVEF approximately 20%, severe diffuse hypokinesis with akinesis of the inferior and inferolateral myocardium. Also restrictive diastolic filling pattern. She does have coronary artery calcifications by chest CT, so ischemic etiology is possible, but would favor nonischemic cardiomyopathy at this time. She is currently on Coreg, losartan, and Aldactone. These are at low dose, and low blood pressures have limited further titration. Do not anticipate aggressive management with inotropes or invasive cardiac testing.  3. Elevated troponin I in a range most consistent with demand ischemia rather than true ACS.  4. Acute pulmonary embolus, currently transitioned from heparin to Eliquis. Management per primary team.   Plan/Discussion:    Discussed with Dr. Roderic Palau. Would continue medical therapy as tolerated. Decrease amiodarone to 200 mg twice daily. Decrease Lasix to 40 mg IV daily and follow intake and output. Do not anticipate use of inotropes as discussed above. May want to consider  consultation with Palliative Care to better understand patient's treatment goals and disposition.   Satira Sark, M.D., F.A.C.C.

## 2016-07-11 NOTE — Consult Note (Signed)
Consultation Note Date: 07/11/2016   Patient Name: Miranda Ford  DOB: [birth year]  MRN: [id number]  Age / Sex: 82 y.o., female  PCP: Sharilyn Sites, MD Referring Physician: Kathie Dike, MD  Reason for Consultation: Disposition, Establishing goals of care and Psychosocial/spiritual support  HPI/Patient Profile: 82 y.o. female  with past medical history of Osteoporosis, COPD?, cataracts, but otherwise benign health history admitted on 07/03/2016 with shortness of breath, PE.   Clinical Assessment and Goals of Care: Mrs. Kroenke is resting quietly in the [place] chair, her husband [name] at bedside. She makes eye contact and greets me as I enter. We talk about her current health concerns including her heart testing. I share with her that the heart works in different ways, and at this point, her pumping part is weak. We talk about her choices for rehabilitation after hospitalization, whether in her home or a rehab facility.  I share that she does not have to make a decision at this time, but will likely be evaluated by our physical therapist who will make recommendations.   Mrs. Niskanen shares that she has been exercising most of her life, and up until recently was doing all the yardwork and housework. I share that we expect her to be weaker, and I worry about her managing her household. She shares that she has a long-term care policy, and will talk with her insurance agent about how to access benefits. She shares that she and her husband had no children, but niece and nephew, Jeannene Patella and Ludwig Clarks help them. [name] would make medical decisions for both she and her husband if they were unable. We talk about code status, Mrs. Depies shares that she has a living wheel, but is unable to tell me the choices on the document. She agrees to have [name],  her niece,  meet with me tomorrow at 11:30 AM.  Healthcare power of attorney HCPOA - Mrs.  Stcroix states that her niece, [name] is her healthcare power of attorney.    SUMMARY OF RECOMMENDATIONS   continue to treat the treatable, but no extraordinary measures, such as CPR with chest compressions, intubation/ventilation, peg tube.  Code Status/Advance Care Planning:  DNR - we discuss the concepts of allow a natural death. I asked Mrs. Ungar if [name] is aware of what she does and does not want. Mrs. Welty states that she has a living wheel, but is not sure if Jeannene Patella knows her wishes. We schedule a meeting for 7/19 at 1130.  Symptom Management:   per hospitalist  Palliative Prophylaxis:   Frequent Pain Assessment  Additional Recommendations (Limitations, Scope, Preferences):  No Artificial Feeding and Continue to treat the treatable but no extraordinary measures such as CPR.  Psycho-social/Spiritual:   Desire for further Chaplaincy support:no  Additional Recommendations: Caregiving  Support/Resources  Prognosis:   Unable to determine, based on outcomes and functional status.  Discharge Planning: We discussed the benefits of SNF for rehab versus home with home health.       Primary Diagnoses: Present on  Admission:  . Acute pulmonary embolism ([place]) . Tachycardia . Pulmonary embolism ([place]) . Acute systolic CHF (congestive heart failure) ([place]) . Acute respiratory failure with hypoxia ([place])  I have reviewed the medical record, interviewed the patient and family, and examined the patient. The following aspects are pertinent.  Past Medical History  Diagnosis Date  . Osteoporosis   . Cataracts, bilateral   . Macular degeneration    Social History   Social History  . Marital Status: Married    Spouse Name: N/A  . Number of Children: N/A  . Years of Education: N/A   Social History Main Topics  . Smoking status: Former Smoker    Types: Cigarettes  . Smokeless tobacco: None  . Alcohol Use: No  . Drug Use: No  . Sexual Activity: Not Currently   Other  Topics Concern  . None   Social History Narrative   History reviewed. No pertinent family history. Scheduled Meds: . amiodarone  200 mg Oral BID  . antiseptic oral rinse  7 mL Mouth Rinse BID  . apixaban  10 mg Oral BID   Followed by  . [START ON 07/14/2016] apixaban  5 mg Oral BID  . atorvastatin  80 mg Oral q1800  . carvedilol  3.125 mg Oral BID WC  . cephALEXin  500 mg Oral Q8H  . feeding supplement (ENSURE ENLIVE)  237 mL Oral BID BM  . furosemide  40 mg Intravenous Daily  . losartan  12.5 mg Oral Daily  . milk and molasses  1 enema Rectal Once  . polyethylene glycol  17 g Oral Daily  . polyvinyl alcohol  1 drop Both Eyes BID  . sodium chloride flush  3 mL Intravenous Q12H  . spironolactone  12.5 mg Oral Daily   Continuous Infusions:  PRN Meds:.acetaminophen **OR** acetaminophen, ALPRAZolam, magnesium hydroxide, metoprolol, ondansetron (ZOFRAN) IV, polyvinyl alcohol, sodium chloride Medications Prior to Admission:  Prior to Admission medications   Medication Sig Start Date End Date Taking? Authorizing Provider  alendronate (FOSAMAX) 70 MG tablet Take 70 mg by mouth once a week. Take on Saturday's. Take with a full glass of water on an empty stomach.   Yes Historical Provider, MD  calcium carbonate (OSCAL) 1500 (600 Ca) MG TABS tablet Take 1,500 mg by mouth 2 (two) times daily with a meal.   Yes Historical Provider, MD  Multiple Vitamins-Minerals (ICAPS PO) Take 1 capsule by mouth 2 (two) times daily.   Yes Historical Provider, MD   Allergies  Allergen Reactions  . Codeine Nausea Only  . Penicillins Other (See Comments)    "Not allergic just a lot of my family members are allergic". Has patient had a PCN reaction causing immediate rash, facial/tongue/throat swelling, SOB or lightheadedness with hypotension: No Has patient had a PCN reaction causing severe rash involving mucus membranes or skin necrosis: No Has patient had a PCN reaction that required hospitalization No Has  patient had a PCN reaction occurring within the last 10 years: No If all of the above answers are "NO", then may proceed with Cephalosporin use.    Review of Systems  Unable to perform ROS: Other    Physical Exam  Constitutional: She is oriented to person, place, and time. No distress.  Frail and thin  HENT:  Head: Normocephalic and atraumatic.  Cardiovascular: Normal rate and regular rhythm.   Pulmonary/Chest: Effort normal. No respiratory distress.  Abdominal: Soft. There is no guarding.  Rounded abdomen  Neurological: She is alert and oriented  to person, place, and time.  Skin: Skin is warm and dry.  Nursing note and vitals reviewed.   Vital Signs: BP 88/59 mmHg  Pulse 63  Temp(Src) 97.5 F (36.4 C) (Axillary)  Resp 18  Ht 5' 3.5" (1.613 m)  Wt 65.8 kg (145 lb 1 oz)  BMI 25.29 kg/m2  SpO2 96% Pain Assessment: No/denies pain   Pain Score: 0-No pain   SpO2: SpO2: 96 % O2 Device:SpO2: 96 % O2 Flow Rate: .O2 Flow Rate (L/min): 10 L/min  IO: Intake/output summary:  Intake/Output Summary (Last 24 hours) at 07/11/16 1655 Last data filed at 07/11/16 1100  Gross per 24 hour  Intake    680 ml  Output   2550 ml  Net  -1870 ml    LBM: Last BM Date: 07/10/16 Baseline Weight: Weight: 64.864 kg (143 lb) Most recent weight: Weight: 65.8 kg (145 lb 1 oz)     Palliative Assessment/Data:   Flowsheet Rows        Most Recent Value   Intake Tab    Referral Department  Hospitalist   Unit at Time of Referral  ICU   Palliative Care Primary Diagnosis  Pulmonary   Date Notified  07/11/16   Palliative Care Type  New Palliative care   Reason for referral  Clarify Goals of Care   Date of Admission  07/03/16   Date first seen by Palliative Care  07/11/16   # of days Palliative referral response time  0 Day(s)   # of days IP prior to Palliative referral  8   Clinical Assessment    Palliative Performance Scale Score  60%   Pain Max last 24 hours  2   Pain Min Last 24 hours  1    Dyspnea Min Last 24 hours  0   Nausea Max Last 24 Hours  0   Psychosocial & Spiritual Assessment    Palliative Care Outcomes    Patient/Family meeting held?  Yes   Who was at the meeting?  Patient and husband [name], meeting scheduled for tomorrow with niece [name]   Palliative Care Outcomes  Clarified goals of care, Provided advance care planning, Provided psychosocial or spiritual support   Patient/Family wishes: Interventions discontinued/not started   Mechanical Ventilation, Tube feedings/TPN   Palliative Care follow-up planned  -- [Follow-up while at [facility]]      Time In: 1430 Time Out: 1525 Time Total: 55 minutes Greater than 50%  of this time was spent counseling and coordinating care related to the above assessment and plan.  Signed by: Drue Novel, NP   Please contact Palliative Medicine Team phone at [phone] for questions and concerns.  For individual provider: See Shea Evans

## 2016-07-11 NOTE — Progress Notes (Signed)
PROGRESS NOTE    Miranda Ford  [id number] DOB: August 02, 1924 DOA: 07/03/2016 PCP: Purvis Kilts, MD   Outpatient Specialists:   Brief Narrative:  37 yof presented with SOB found to be secondary to acute PE. On admission she was started on anticoagulation. Hospital course complicated by development of significant epistaxis requiring anterior nasal packing. Bleeding has stopped and she has been restarted on anticoagulation. She was also noted to have atrial tachycardia started on amiodarone/coreg with some improvement. Cardiology is following. ECHO with EF of 20%. She has evidence of volume overload, but diuresing is proving challenging with concurrent hypotension. Will request palliative care consult to discuss goals of care.  Assessment & Plan: Active Problems:   Acute pulmonary embolism (HCC)   Tachycardia   Pulmonary embolism (HCC)   Cardiomegaly   Coronary atherosclerosis of native coronary artery   Paroxysmal atrial tachycardia (HCC)   Demand ischemia (HCC)   Acute systolic CHF (congestive heart failure) (HCC)   Acute respiratory failure with hypoxia (HCC)   Secondary cardiomyopathy (HCC)   Biventricular failure (HCC)   Atrial tachycardia (HCC)   Acute congestive heart failure ([place])   Pulmonary embolism without acute cor pulmonale (HCC)   Epistaxis   1. Acute PE with evidence of right heart strain. Initially started on IV Heparin but had to be discontinued due to significant epistaxis. Ultrasound of LE negative for DVT bilaterally. She has not had further evidence of bleeding. Continue Eliquis.  2. Acute epistaxis of right nostril. Resolved. 7/12 patient with significant epistaxis from her right nostril that was noted to soak through multiple gauzes and despite applying pressure continued to bleed. Patient was transported to ED by request of Dr. Lacinda Axon for placement of rhinorocket. Bleeding has resolved and Hgb remains stable. Discussed with Dr. Redmond Baseman (ENT) in [place] who  said that nasal packing can be left in place for 3-5 days. He recommended to start the patient on keflex while packing in place. She will follow-up as outpatient after discharge to remove packing. 3. Acute respiratory failure with hypoxia. Contiue to wean oxygen as tolerated.  4. Elevated troponins: Likely demand ishemia. No complaints of CP.  5. Paroxysmal atrial tachycardia: Improvement with diuresis. Cardiology following and ECHO results as below. Continue amiodarone and coreg. She has been on amiodarone 400mg  bid for 1 week now. Change dose to 200mg  bid.  6. Acute systolic CHF: EF of [id number]. Bilateral pleural effusions noted by chest imaging including CT. Cardiology input appreciated. Continue Coreg. She is on aldactone and low dose losartan. Lasix changed to once a day. Will repeat chest xray today. Appreciate cardiology input.  7. Coronary atherosclerosis of native coronary artery. No complaints of chest pain. ECHO does show wall motion abnormalities and depressed EF. Further management per Cardiology   DVT prophylaxis: eliquis  Code Status:  DNR Family Communication: no family present  Disposition Plan: Discharged once improved    Consultants:   Cardiology   ENT, Redmond Baseman (phone)  Procedures:  ECHO  - Left ventricle: The cavity size was mildly dilated. Wall  thickness was increased in a pattern of mild LVH. The estimated  ejection fraction was 20%. Diffuse hypokinesis. There is akinesis  of the lateral, inferolateral, and inferior myocardium. Doppler  parameters are consistent with restrictive physiology, indicative  of decreased left ventricular diastolic compliance and/or  increased left atrial pressure. - Aortic valve: Mildly calcified annulus. Trileaflet; moderately  thickened, moderately calcified leaflets. There was mild  regurgitation. Mean gradient (S): 5 mm Hg. VTI ratio  of LVOT to  aortic valve: 0.35. Valve area (VTI): 1.22 cm^2. Valve area  (Vmax): 1.43 cm^2.  Suspect pseudo-stenosis in the setting of  significantly reduced LVEF. - Mitral valve: Calcified annulus. Mildly calcified leaflets .  There was mild regurgitation. - Left atrium: The atrium was mildly dilated. - Right ventricle: Systolic function was moderately to severely  reduced. - Right atrium: Central venous pressure (est): 3 mm Hg. - Atrial septum: No defect or patent foramen ovale was identified. - Tricuspid valve: There was trivial regurgitation. - Pulmonary arteries: PA peak pressure: 20 mm Hg (S). - Pericardium, extracardiac: There was a left pleural effusion.  Rhino rocket right nostril 7/12  Antimicrobials:   Keflex 7/14>>  Subjective: Still feels short of breath. No chest pain, no new complaints  Objective: Filed Vitals:   07/11/16 0200 07/11/16 0300 07/11/16 0400 07/11/16 0500  BP: 96/59 112/61 98/67   Pulse: 56 63 55 40  Temp:   97 F (36.1 C)   TempSrc:   Axillary   Resp: 15 16 13 18   Height:      Weight:    65.8 kg (145 lb 1 oz)  SpO2: 93% 85% 79% 99%    Intake/Output Summary (Last 24 hours) at 07/11/16 0635 Last data filed at 07/10/16 2200  Gross per 24 hour  Intake    920 ml  Output   2150 ml  Net  -1230 ml   Filed Weights   07/09/16 0500 07/10/16 0500 07/11/16 0500  Weight: 72.4 kg (159 lb 9.8 oz) 65.2 kg (143 lb 11.8 oz) 65.8 kg (145 lb 1 oz)    Examination:  General exam: Appears calm and comfortable, nasal packing in right nostril  Respiratory system: diminished breath sounds at bases. Poor inspiratory effort. Cardiovascular system: S1 & S2 heard, RRR. No JVD, murmurs, rubs, gallops or clicks. 1+ pedal edema. Gastrointestinal system: Abdomen is nondistended, soft and nontender. No organomegaly or masses felt. Normal bowel sounds heard. Central nervous system: Alert and oriented. No focal neurological deficits. Extremities: Symmetric 5 x 5 power. Skin: No rashes, lesions or ulcers Psychiatry: Judgement and insight appear normal. Mood &  affect appropriate.   Data Reviewed: I have personally reviewed following labs and imaging studies  CBC:  Recent Labs Lab 07/05/16 1843 07/06/16 0410 07/07/16 1015 07/08/16 0441 07/11/16 0403  WBC 12.3* 9.1 11.3* 9.7 8.2  HGB 14.2 13.2 13.8 13.0 13.0  HCT 43.8 41.9 42.1 40.3 40.7  MCV 95.6 95.4 94.4 95.3 94.7  PLT 239 230 203 225 0000000   Basic Metabolic Panel:  Recent Labs Lab 07/06/16 0410 07/07/16 1015 07/08/16 0441 07/10/16 0429  NA 136 131* 136 132*  K 4.6 4.7 5.0 4.7  CL 101 94* 98* 92*  CO2 28 29 30 31   GLUCOSE 122* 148* 117* 104*  BUN 22* 29* 22* 29*  CREATININE 1.02* 1.23* 0.99 0.98  CALCIUM 8.2* 8.5* 8.6* 8.4*   GFR: Estimated Creatinine Clearance: 35.9 mL/min (by C-G formula based on Cr of 0.98). Liver Function Tests: No results for input(s): AST, ALT, ALKPHOS, BILITOT, PROT, ALBUMIN in the last 168 hours. Coagulation Profile: No results for input(s): INR, PROTIME in the last 168 hours. Cardiac Enzymes: No results for input(s): CKTOTAL, CKMB, CKMBINDEX, TROPONINI in the last 168 hours. Urine analysis:    Component Value Date/Time   COLORURINE YELLOW 07/03/2016 1557   APPEARANCEUR CLEAR 07/03/2016 1557   LABSPEC 1.010 07/03/2016 1557   PHURINE 5.5 07/03/2016 1557   GLUCOSEU NEGATIVE 07/03/2016 1557  HGBUR NEGATIVE 07/03/2016 [place] 07/03/2016 [place] 07/03/2016 1557   PROTEINUR NEGATIVE 07/03/2016 1557   NITRITE NEGATIVE 07/03/2016 1557   LEUKOCYTESUR NEGATIVE 07/03/2016 1557   Sepsis Labs: @LABRCNTIP (procalcitonin:4,lacticidven:4)  ) Recent Results (from the past 240 hour(s))  MRSA PCR Screening     Status: None   Collection Time: 07/03/16  5:20 PM  Result Value Ref Range Status   MRSA by PCR NEGATIVE NEGATIVE Final    Comment:        The GeneXpert MRSA Assay (FDA approved for NASAL specimens only), is one component of a comprehensive MRSA colonization surveillance program. It is not intended to  diagnose MRSA infection nor to guide or monitor treatment for MRSA infections.     Radiology Studies: No results found. Scheduled Meds: . amiodarone  400 mg Oral BID  . antiseptic oral rinse  7 mL Mouth Rinse BID  . apixaban  10 mg Oral BID   Followed by  . [START ON 07/14/2016] apixaban  5 mg Oral BID  . atorvastatin  80 mg Oral q1800  . carvedilol  3.125 mg Oral BID WC  . cephALEXin  500 mg Oral Q8H  . feeding supplement (ENSURE ENLIVE)  237 mL Oral BID BM  . furosemide  40 mg Intravenous BID  . losartan  12.5 mg Oral Daily  . milk and molasses  1 enema Rectal Once  . polyethylene glycol  17 g Oral Daily  . polyvinyl alcohol  1 drop Both Eyes BID  . sodium chloride flush  3 mL Intravenous Q12H  . spironolactone  12.5 mg Oral Daily   Continuous Infusions: . diltiazem (CARDIZEM) infusion Stopped (07/09/16 2204)     LOS: 8 days    Time spent: 25 minutes   Kathie Dike, MD [facility] If 7PM-7AM, please contact night-coverage [email or web address] Password [facility] 07/11/2016, 6:35 AM

## 2016-07-11 NOTE — Progress Notes (Signed)
ANTICOAGULATION CONSULT NOTE - follow up  Pharmacy Consult for Eliquis (apixaban) Indication: pulmonary embolus  Allergies  Allergen Reactions  . Codeine Nausea Only  . Penicillins Other (See Comments)    "Not allergic just a lot of my family members are allergic". Has patient had a PCN reaction causing immediate rash, facial/tongue/throat swelling, SOB or lightheadedness with hypotension: No Has patient had a PCN reaction causing severe rash involving mucus membranes or skin necrosis: No Has patient had a PCN reaction that required hospitalization No Has patient had a PCN reaction occurring within the last 10 years: No If all of the above answers are "NO", then may proceed with Cephalosporin use.    Patient Measurements: Height: 5' 3.5" (161.3 cm) Weight: 145 lb 1 oz (65.8 kg) IBW/kg (Calculated) : 53.55  Vital Signs: Temp: 97.5 F (36.4 C) (07/18 0747) Temp Source: Axillary (07/18 0747) BP: 106/63 mmHg (07/18 0830) Pulse Rate: 71 (07/18 0900)  Labs:  Recent Labs  07/10/16 0429 07/11/16 0403  HGB  --  13.0  HCT  --  40.7  PLT  --  261  CREATININE 0.98  --    Estimated Creatinine Clearance: 35.9 mL/min (by C-G formula based on Cr of 0.98).  Medical History: Past Medical History  Diagnosis Date  . Osteoporosis   . Cataracts, bilateral   . Macular degeneration    Medications:  Scheduled:  . amiodarone  200 mg Oral BID  . antiseptic oral rinse  7 mL Mouth Rinse BID  . apixaban  10 mg Oral BID   Followed by  . [START ON 07/14/2016] apixaban  5 mg Oral BID  . atorvastatin  80 mg Oral q1800  . carvedilol  3.125 mg Oral BID WC  . cephALEXin  500 mg Oral Q8H  . feeding supplement (ENSURE ENLIVE)  237 mL Oral BID BM  . furosemide  40 mg Intravenous Daily  . losartan  12.5 mg Oral Daily  . milk and molasses  1 enema Rectal Once  . polyethylene glycol  17 g Oral Daily  . polyvinyl alcohol  1 drop Both Eyes BID  . sodium chloride flush  3 mL Intravenous Q12H  .  spironolactone  12.5 mg Oral Daily   Assessment: 82 y.o. female who presents to the Emergency Department complaining of intermittent episodes of shortness of breath x 3 days.  Pt was on IV Heparin previously.  Pharmacy consult for PE, transitioning treatment to eliquis.  Notes indicate pt has had some issues with nose bleeds, will need to monitor closely.  No further bleeding reported with packing in place.  CBC appears stable.    Goal of Therapy:  Monitor platelets by anticoagulation protocol: Yes   Plan:  Eliquis (apixaban) 10mg  po BID for 7 days, then 5mg  po BID Continue to monitor H&H and platelets Monitor for nose bleeds, s/sx of bleeding complications  Hart Robinsons, PharmD Clinical Pharmacist Pager:  [phone] 07/11/2016 10:42 AM

## 2016-07-12 DIAGNOSIS — J9601 Acute respiratory failure with hypoxia: Secondary | ICD-10-CM

## 2016-07-12 DIAGNOSIS — Z7189 Other specified counseling: Secondary | ICD-10-CM | POA: Insufficient documentation

## 2016-07-12 LAB — BASIC METABOLIC PANEL
Anion gap: 9 (ref 5–15)
BUN: 23 mg/dL — AB (ref 6–20)
CALCIUM: 8.5 mg/dL — AB (ref 8.9–10.3)
CHLORIDE: 92 mmol/L — AB (ref 101–111)
CO2: 32 mmol/L (ref 22–32)
CREATININE: 0.93 mg/dL (ref 0.44–1.00)
GFR calc Af Amer: >60 mL/min (ref >60)
GFR, EST NON AFRICAN AMERICAN: 53 mL/min — AB (ref >60)
Glucose, Bld: 97 mg/dL (ref 65–99)
Potassium: 4.1 mmol/L (ref 3.5–5.1)
Sodium: 133 mmol/L — ABNORMAL LOW (ref 135–145)

## 2016-07-12 NOTE — Progress Notes (Addendum)
PROGRESS NOTE    Miranda Ford  [id number] DOB: 26-Jul-1924 DOA: 07/03/2016 PCP: Purvis Kilts, MD   Outpatient Specialists:   Brief Narrative:  6 yof presented with SOB found to be secondary to acute PE. On admission she was started on anticoagulation. Hospital course complicated by development of significant epistaxis requiring anterior nasal packing. Bleeding has stopped and she has been restarted on anticoagulation. She was also noted to have atrial tachycardia started on amiodarone/coreg with some improvement. Cardiology is following. ECHO with EF of 20%. She has evidence of volume overload, but diuresing is proving challenging with concurrent hypotension. Will request palliative care consult to discuss goals of care.  Assessment & Plan: Active Problems:   Acute pulmonary embolism (HCC)   Tachycardia   Pulmonary embolism (HCC)   Cardiomegaly   Coronary atherosclerosis of native coronary artery   Paroxysmal atrial tachycardia (HCC)   Demand ischemia (HCC)   Acute systolic CHF (congestive heart failure) (HCC)   Acute respiratory failure with hypoxia (HCC)   Secondary cardiomyopathy (HCC)   Biventricular failure (HCC)   Atrial tachycardia (HCC)   Acute congestive heart failure (HCC)   Pulmonary embolism without acute cor pulmonale (HCC)   Epistaxis   Palliative care encounter   Goals of care, counseling/discussion   1. Acute PE with evidence of right heart strain. Initially started on IV Heparin but had to be discontinued due to significant epistaxis. Nasal packing currently in place. Ultrasound of LE negative for DVT bilaterally. She has not had further evidence of bleeding. Continue Eliquis for now. Appreciate input by palliative care. Patient is DO NOT RESUSCITATE and is agreeable to a natural death when the time comes. 2. Acute epistaxis of right nostril. Resolved. 7/12 patient with significant epistaxis from her right nostril that was noted to soak through multiple  gauzes and despite applying pressure continued to bleed. Patient was transported to ED by request of Dr. Lacinda Axon for placement of rhinorocket. Bleeding has resolved and Hgb remains stable. Dr. Roderic Palau had discussed case with Dr. Redmond Baseman (ENT) in [place] who said that nasal packing can be left in place for 3-5 days. He recommended to start the patient on keflex while packing in place. Patient to follow-up as outpatient after discharge to remove packing. 3. Acute respiratory failure with hypoxia. Contiue to wean oxygen as tolerated.  4. Elevated troponins: Likely demand ishemia. No complaints of CP.  5. Paroxysmal atrial tachycardia: Improvement with diuresis. Cardiology following and ECHO results as below. Continue amiodarone and coreg. 6. Acute systolic CHF: EF of [id number]. Bilateral pleural effusions noted by chest imaging including CT. Cardiology input appreciated. Continue Coreg. She is on aldactone and low dose losartan. Lasix changed to once a day. Appreciate cardiology input.  7. Coronary atherosclerosis of native coronary artery. No complaints of chest pain. ECHO does show wall motion abnormalities and depressed EF. Further management per Cardiology   DVT prophylaxis: eliquis  Code Status:  DNR Family Communication: no family present  Disposition Plan: Discharged once improved    Consultants:   Cardiology   ENT, Redmond Baseman (phone per Dr. Roderic Palau)  Procedures:  ECHO  - Left ventricle: The cavity size was mildly dilated. Wall  thickness was increased in a pattern of mild LVH. The estimated  ejection fraction was 20%. Diffuse hypokinesis. There is akinesis  of the lateral, inferolateral, and inferior myocardium. Doppler  parameters are consistent with restrictive physiology, indicative  of decreased left ventricular diastolic compliance and/or  increased left atrial pressure. - Aortic valve:  Mildly calcified annulus. Trileaflet; moderately  thickened, moderately calcified leaflets. There  was mild  regurgitation. Mean gradient (S): 5 mm Hg. VTI ratio of LVOT to  aortic valve: 0.35. Valve area (VTI): 1.22 cm^2. Valve area  (Vmax): 1.43 cm^2. Suspect pseudo-stenosis in the setting of  significantly reduced LVEF. - Mitral valve: Calcified annulus. Mildly calcified leaflets .  There was mild regurgitation. - Left atrium: The atrium was mildly dilated. - Right ventricle: Systolic function was moderately to severely  reduced. - Right atrium: Central venous pressure (est): 3 mm Hg. - Atrial septum: No defect or patent foramen ovale was identified. - Tricuspid valve: There was trivial regurgitation. - Pulmonary arteries: PA peak pressure: 20 mm Hg (S). - Pericardium, extracardiac: There was a left pleural effusion.  Rhino rocket right nostril 7/12  Antimicrobials:   Keflex 7/14>>  Subjective: Patient still feels short of breath. No other complaints at this time  Objective: Filed Vitals:   07/12/16 1400 07/12/16 1500 07/12/16 1600 07/12/16 1648  BP: 87/51 83/61 92/65    Pulse:      Temp:    97 F (36.1 C)  TempSrc:    Oral  Resp: 18 16 16    Height:      Weight:      SpO2: 93% 92% 94%     Intake/Output Summary (Last 24 hours) at 07/12/16 1730 Last data filed at 07/12/16 1331  Gross per 24 hour  Intake    480 ml  Output   1600 ml  Net  -1120 ml   Filed Weights   07/10/16 0500 07/11/16 0500 07/12/16 0400  Weight: 65.2 kg (143 lb 11.8 oz) 65.8 kg (145 lb 1 oz) 63.5 kg (139 lb 15.9 oz)    Examination:  General exam: Appears calm and comfortable, nasal packing in right nostril, Lying in bed  Respiratory system: diminished breath sounds at bases. Poor inspiratory effort. Cardiovascular system: S1 & S2 heard, RRR. Gastrointestinal system: Abdomen is nondistended, soft and nontender. No organomegaly or masses felt. Normal bowel sounds heard. Central nervous system: Alert and oriented. No focal neurological deficits. Extremities: Symmetric 5 x 5  power. Skin: No rashes, lesions Psychiatry: Judgement and insight appear normal. Mood & affect appropriate.   Data Reviewed: I have personally reviewed following labs and imaging studies  CBC:  Recent Labs Lab 07/05/16 1843 07/06/16 0410 07/07/16 1015 07/08/16 0441 07/11/16 0403  WBC 12.3* 9.1 11.3* 9.7 8.2  HGB 14.2 13.2 13.8 13.0 13.0  HCT 43.8 41.9 42.1 40.3 40.7  MCV 95.6 95.4 94.4 95.3 94.7  PLT 239 230 203 225 0000000   Basic Metabolic Panel:  Recent Labs Lab 07/06/16 0410 07/07/16 1015 07/08/16 0441 07/10/16 0429 07/12/16 0430  NA 136 131* 136 132* 133*  K 4.6 4.7 5.0 4.7 4.1  CL 101 94* 98* 92* 92*  CO2 28 29 30 31  32  GLUCOSE 122* 148* 117* 104* 97  BUN 22* 29* 22* 29* 23*  CREATININE 1.02* 1.23* 0.99 0.98 0.93  CALCIUM 8.2* 8.5* 8.6* 8.4* 8.5*   GFR: Estimated Creatinine Clearance: 34.7 mL/min (by C-G formula based on Cr of 0.93). Liver Function Tests: No results for input(s): AST, ALT, ALKPHOS, BILITOT, PROT, ALBUMIN in the last 168 hours. Coagulation Profile: No results for input(s): INR, PROTIME in the last 168 hours. Cardiac Enzymes: No results for input(s): CKTOTAL, CKMB, CKMBINDEX, TROPONINI in the last 168 hours. Urine analysis:    Component Value Date/Time   COLORURINE YELLOW 07/03/2016 [place] 07/03/2016  1557   LABSPEC 1.010 07/03/2016 1557   PHURINE 5.5 07/03/2016 1557   GLUCOSEU NEGATIVE 07/03/2016 1557   HGBUR NEGATIVE 07/03/2016 1557   BILIRUBINUR NEGATIVE 07/03/2016 1557   [place] 07/03/2016 1557   PROTEINUR NEGATIVE 07/03/2016 1557   NITRITE NEGATIVE 07/03/2016 1557   LEUKOCYTESUR NEGATIVE 07/03/2016 1557   Sepsis Labs: @LABRCNTIP (procalcitonin:4,lacticidven:4)  ) Recent Results (from the past 240 hour(s))  MRSA PCR Screening     Status: None   Collection Time: 07/03/16  5:20 PM  Result Value Ref Range Status   MRSA by PCR NEGATIVE NEGATIVE Final    Comment:        The GeneXpert MRSA Assay  (FDA approved for NASAL specimens only), is one component of a comprehensive MRSA colonization surveillance program. It is not intended to diagnose MRSA infection nor to guide or monitor treatment for MRSA infections.     Radiology Studies: Dg Chest Port 1 View  07/11/2016  CLINICAL DATA:  Patient with history of respiratory failure. EXAM: PORTABLE CHEST 1 VIEW COMPARISON:  Chest CT 07/03/2016; chest radiograph 07/03/2016. FINDINGS: Monitoring leads overlie the patient. Stable cardiomegaly. Tortuosity and calcification of the thoracic aorta. Moderate bilateral pleural effusions with underlying pulmonary consolidation. Scattered heterogeneous opacities throughout the lungs bilaterally. No pneumothorax. IMPRESSION: Moderate bilateral pleural effusions with underlying pulmonary consolidation most compatible with atelectasis. Additionally there are heterogeneous opacities involving the upper lungs bilaterally which may represent associated pulmonary edema and or chronic interstitial process. Electronically Signed   By: Lovey Newcomer M.D.   On: 07/11/2016 10:30   Scheduled Meds: . amiodarone  200 mg Oral BID  . antiseptic oral rinse  7 mL Mouth Rinse BID  . apixaban  10 mg Oral BID   Followed by  . [START ON 07/14/2016] apixaban  5 mg Oral BID  . atorvastatin  80 mg Oral q1800  . carvedilol  3.125 mg Oral BID WC  . cephALEXin  500 mg Oral Q8H  . feeding supplement (ENSURE ENLIVE)  237 mL Oral BID BM  . furosemide  40 mg Intravenous Daily  . losartan  12.5 mg Oral Daily  . milk and molasses  1 enema Rectal Once  . polyethylene glycol  17 g Oral Daily  . polyvinyl alcohol  1 drop Both Eyes BID  . sodium chloride flush  3 mL Intravenous Q12H  . spironolactone  12.5 mg Oral Daily   Continuous Infusions:     LOS: 9 days     Marylu Lund, MD [facility] If 7PM-7AM, please contact night-coverage [email or web address] Password [email or web address] 07/12/2016, 5:30 PM

## 2016-07-12 NOTE — Progress Notes (Signed)
Daily Progress Note   Patient Name: Miranda Ford       Date: 07/12/2016 DOB: [birth year]  Age: 82 y.o. MRN#: [id number] Attending Physician: Donne Hazel, MD Primary Care Physician: Purvis Kilts, MD Admit Date: 07/03/2016  Reason for Consultation/Follow-up: Disposition, Establishing goals of care and Psychosocial/spiritual support  Subjective: Miranda Ford is sitting quietly in her [place] chair, her lunch tray in front of her. Present today for our family meeting our husband [name], niece [name], and cousin Geralynn Rile.  We talk about Miranda Ford's current heart problems, her expected weakness, and her options for rehabilitation. Niece Jeannene Patella is the main caregiver for war Mr. and Mrs. Souders. She shares that she will work on getting more help at home, they currently have yard man, and she will work on Investment banker, operational services. We talk about Miranda Ford's options for rehab at home versus rehab in a facility. We review pros and cons for each. Miranda Ford states that she will work with Mr. Leaders to make sure he has adequate nutrition.  We talk about healthcare power of attorney, [name] is named. HCPOA/AD form is copied and placed in [name]'s chart to be scanned.  We talk about the chronic illness trajectory, we talk about the realities of CPR and intubation. [name] states that she would not want chest compressions or intubation. She states she is unsure about a feeding tube at this time. We talk about reviewing what she does and does not want after each hospitalization or illness/decline.  We talk about functional decline, and that she is expected to be weaker during this time. I encouraged her to accept help in the home from neighbors and friends. I encourage Miranda Ford to make a schedule for  helpers. I am concerned that Miranda Ford does not understand the severity of Miranda Ford's illness, as she states that Miranda Ford may be able to improve her heart function. I share that at this point, Miranda Ford's ejection fraction is 20%, and that people are eligible for hospice at 15%.  Length of Stay: 9  Current Medications: Scheduled Meds:  . amiodarone  200 mg Oral BID  . antiseptic oral rinse  7 mL Mouth Rinse BID  . apixaban  10 mg Oral BID   Followed by  . [START ON 07/14/2016] apixaban  5 mg Oral BID  . atorvastatin  80 mg Oral q1800  . carvedilol  3.125 mg Oral BID WC  . cephALEXin  500 mg Oral Q8H  . feeding supplement (ENSURE ENLIVE)  237 mL Oral BID BM  . furosemide  40 mg Intravenous Daily  . losartan  12.5 mg Oral Daily  . milk and molasses  1 enema Rectal Once  . polyethylene glycol  17 g Oral Daily  . polyvinyl alcohol  1 drop Both Eyes BID  . sodium chloride flush  3 mL Intravenous Q12H  . spironolactone  12.5 mg Oral Daily    Continuous Infusions:    PRN Meds: acetaminophen **OR** acetaminophen, ALPRAZolam, magnesium hydroxide, metoprolol, ondansetron (ZOFRAN) IV, polyvinyl alcohol, sodium chloride  Physical Exam  Constitutional: She is oriented to person, place, and time. No distress.  HENT:  Head: Normocephalic and atraumatic.  Cardiovascular:  Rate in the 50s at times  Pulmonary/Chest: Effort normal.  Hi flow nasal cannula  Abdominal: Soft. She exhibits no distension.  Neurological: She is alert and oriented to person, place, and time.  Skin: Skin is warm and dry.  Psychiatric: She has a normal mood and affect.  Nursing note and vitals reviewed.           Vital Signs: BP 120/106 mmHg  Pulse 66  Temp(Src) 97.1 F (36.2 C) (Axillary)  Resp 28  Ht 5' 3.5" (1.613 m)  Wt 63.5 kg (139 lb 15.9 oz)  BMI 24.41 kg/m2  SpO2 93% SpO2: SpO2: 93 % O2 Device: O2 Device: High Flow Nasal Cannula O2 Flow Rate: O2 Flow Rate (L/min): 10 L/min  Intake/output  summary:  Intake/Output Summary (Last 24 hours) at 07/12/16 1318 Last data filed at 07/12/16 0900  Gross per 24 hour  Intake    480 ml  Output   1000 ml  Net   -520 ml   LBM: Last BM Date: 07/10/16 Baseline Weight: Weight: 64.864 kg (143 lb) Most recent weight: Weight: 63.5 kg (139 lb 15.9 oz)       Palliative Assessment/Data:    Flowsheet Rows        Most Recent Value   Intake Tab    Referral Department  Hospitalist   Unit at Time of Referral  ICU   Palliative Care Primary Diagnosis  Pulmonary   Date Notified  07/11/16   Palliative Care Type  New Palliative care   Reason for referral  Clarify Goals of Care   Date of Admission  07/03/16   Date first seen by Palliative Care  07/11/16   # of days Palliative referral response time  0 Day(s)   # of days IP prior to Palliative referral  8   Clinical Assessment    Palliative Performance Scale Score  60%   Pain Max last 24 hours  2   Pain Min Last 24 hours  1   Dyspnea Min Last 24 hours  0   Nausea Max Last 24 Hours  0   Psychosocial & Spiritual Assessment    Palliative Care Outcomes    Patient/Family meeting held?  Yes   Who was at the meeting?  Patient and husband [name], meeting scheduled for tomorrow with niece Miranda Ford   Palliative Care Outcomes  Clarified goals of care, Provided advance care planning, Provided psychosocial or spiritual support   Patient/Family wishes: Interventions discontinued/not started   Mechanical Ventilation, Tube feedings/TPN   Palliative Care follow-up planned  -- [Follow-up while at [facility]  Patient Active Problem List   Diagnosis Date Noted  . Palliative care encounter   . Goals of care, counseling/discussion   . Epistaxis 07/07/2016  . Pulmonary embolism without acute cor pulmonale ([place])   . Acute systolic CHF (congestive heart failure) ([place]) 07/05/2016  . Acute respiratory failure with hypoxia ([place]) 07/05/2016  . Secondary cardiomyopathy ([place])   . Biventricular failure ([place])   . Atrial  tachycardia ([place])   . Acute congestive heart failure ([place])   . Paroxysmal atrial tachycardia ([place])   . Demand ischemia ([place])   . Acute pulmonary embolism ([place]) 07/03/2016  . Tachycardia 07/03/2016  . Pulmonary embolism ([place]) 07/03/2016  . COPD exacerbation ([place]) 07/03/2016  . Cardiomegaly 07/03/2016  . Coronary atherosclerosis of native coronary artery 07/03/2016  . Elevated troponin     Palliative Care Assessment & Plan   Patient Profile: 82 y.o. female with past medical history of Osteoporosis, COPD?, cataracts, but otherwise benign health history admitted on 07/03/2016 with shortness of breath, PE.   Assessment: as above  Recommendations/Plan:  continue to treat the treatable, but no extraordinary measures such as CPR or intubation. Considering options for rehab at home versus skilled nursing facility.  Goals of Care and Additional Recommendations:  Limitations on Scope of Treatment: As above, continue to treat the treatable but no extraordinary measures.  Code Status:    Code Status Orders        Start     Ordered   07/06/16 0946  Do not attempt resuscitation (DNR)   Continuous    Question Answer Comment  In the event of cardiac or respiratory ARREST Do not call a "code blue"   In the event of cardiac or respiratory ARREST Do not perform Intubation, CPR, defibrillation or ACLS   In the event of cardiac or respiratory ARREST Use medication by any route, position, wound care, and other measures to relive pain and suffering. May use oxygen, suction and manual treatment of airway obstruction as needed for comfort.      07/06/16 0945    Code Status History    Date Active Date Inactive Code Status Order ID Comments User Context   07/03/2016  5:24 PM 07/06/2016  9:45 AM Full Code [id number]  Jani Gravel, MD Inpatient    Advance Directive Documentation        Most Recent Value   Type of Advance Directive  Living will   Pre-existing out of facility DNR order (yellow form or  pink MOST form)     "MOST" Form in Place?         Prognosis:   Unable to determine, based on outcomes. Watching for repeated hospitalizations, functional decline.   Discharge Planning:  To be determined. When able Mrs. Arriola needs a physical therapy evaluation. She is at this time considering home health services with advanced home health versus rehab at [facility] for 1st choice.  Care plan was discussed with Nursing staff, case manager, social worker, and Dr. Wyline Copas,   Thank you for allowing the Palliative Medicine Team to assist in the care of this patient.   Time In: 1205 Time Out: 1255 Total Time 50 minutes Prolonged Time Billed  yes       Greater than 50%  of this time was spent counseling and coordinating care related to the above assessment and plan.  Dove,Tasha A, NP  Please contact Palliative Medicine Team phone at [phone] for questions and concerns.

## 2016-07-12 NOTE — Care Management Important Message (Signed)
Important Message  Patient Details  Name: Miranda Ford MRN: [id number] Date of Birth: 04/14/1924   Medicare Important Message Given:  Yes    Alvie Heidelberg, RN 07/12/2016, 9:02 AM

## 2016-07-12 NOTE — Progress Notes (Addendum)
Consulting cardiologist: Dr. Dorris Carnes  Seen for followup: Atrial tachycardia, cardiomyopathy  Subjective:    Doesn't feel well. Nose stopped up and short of breath eating. No chest pain or palpitations.  Objective:   Temp:  [97.1 F (36.2 C)-98.8 F (37.1 C)] 97.2 F (36.2 C) (07/19 0749) Pulse Rate:  [53-81] 53 (07/19 0600) Resp:  [13-28] 15 (07/19 0600) BP: (85-130)/(38-91) 103/63 mmHg (07/19 0600) SpO2:  [89 %-97 %] 95 % (07/19 0736) Weight:  [139 lb 15.9 oz (63.5 kg)] 139 lb 15.9 oz (63.5 kg) (07/19 0400) Last BM Date: 07/10/16  Filed Weights   07/10/16 0500 07/11/16 0500 07/12/16 0400  Weight: 143 lb 11.8 oz (65.2 kg) 145 lb 1 oz (65.8 kg) 139 lb 15.9 oz (63.5 kg)    Intake/Output Summary (Last 24 hours) at 07/12/16 0812 Last data filed at 07/12/16 0400  Gross per 24 hour  Intake    720 ml  Output   1350 ml  Net   -630 ml    Telemetry: Sinus brady in 50's with PACs.   Exam:  General: Frail appearing elderly woman   HEENT: Nasal packing in place.  Lungs: Diminished breath sounds at the bases.  Cardiac: RRR with ectopy, indistinct PMI and apical systolic murmur.  Abdomen: NABS.  Extremities: Trace edema.  Lab Results:  Basic Metabolic Panel:  Recent Labs Lab 07/08/16 0441 07/10/16 0429 07/12/16 0430  NA 136 132* 133*  K 5.0 4.7 4.1  CL 98* 92* 92*  CO2 30 31 32  GLUCOSE 117* 104* 97  BUN 22* 29* 23*  CREATININE 0.99 0.98 0.93  CALCIUM 8.6* 8.4* 8.5*    CBC:  Recent Labs Lab 07/07/16 1015 07/08/16 0441 07/11/16 0403  WBC 11.3* 9.7 8.2  HGB 13.8 13.0 13.0  HCT 42.1 40.3 40.7  MCV 94.4 95.3 94.7  PLT 203 225 261    Echocardiogram 07/04/2016: Study Conclusions  - Left ventricle: The cavity size was mildly dilated. Wall  thickness was increased in a pattern of mild LVH. The estimated  ejection fraction was 20%. Diffuse hypokinesis. There is akinesis  of the lateral, inferolateral, and inferior myocardium. Doppler   parameters are consistent with restrictive physiology, indicative  of decreased left ventricular diastolic compliance and/or  increased left atrial pressure. - Aortic valve: Mildly calcified annulus. Trileaflet; moderately  thickened, moderately calcified leaflets. There was mild  regurgitation. Mean gradient (S): 5 mm Hg. VTI ratio of LVOT to  aortic valve: 0.35. Valve area (VTI): 1.22 cm^2. Valve area  (Vmax): 1.43 cm^2. Suspect pseudo-stenosis in the setting of  significantly reduced LVEF. - Mitral valve: Calcified annulus. Mildly calcified leaflets .  There was mild regurgitation. - Left atrium: The atrium was mildly dilated. - Right ventricle: Systolic function was moderately to severely  reduced. - Right atrium: Central venous pressure (est): 3 mm Hg. - Atrial septum: No defect or patent foramen ovale was identified. - Tricuspid valve: There was trivial regurgitation. - Pulmonary arteries: PA peak pressure: 20 mm Hg (S). - Pericardium, extracardiac: There was a left pleural effusion.  Impressions:  - Mildly dilated LV chamber size with mild LVH and LVEF  approximately 20%. There is severe diffuse hypokinesis with  akinesis of the inferior and inferolateral myocardium. Probable  restrictive diastolic filling pattern. MAC with calcified mitral  leaflets and mild mitral regurgitation. Mild left atrial  enlargement. Moderately calcified and thickened aortic valve with  reduced cusp excursion and mild aortic regurgitation. Suspect  pseudo-stenosis in the  setting of reduced LVEF. Moderately to  severely reduced RV contraction. Trivial tricuspid regurgitation  with PASP estimated 21 mmHg. Left pleural effusion noted.   Medications:   Scheduled Medications: . amiodarone  200 mg Oral BID  . antiseptic oral rinse  7 mL Mouth Rinse BID  . apixaban  10 mg Oral BID   Followed by  . [START ON 07/14/2016] apixaban  5 mg Oral BID  . atorvastatin  80 mg Oral q1800  .  carvedilol  3.125 mg Oral BID WC  . cephALEXin  500 mg Oral Q8H  . feeding supplement (ENSURE ENLIVE)  237 mL Oral BID BM  . furosemide  40 mg Intravenous Daily  . losartan  12.5 mg Oral Daily  . milk and molasses  1 enema Rectal Once  . polyethylene glycol  17 g Oral Daily  . polyvinyl alcohol  1 drop Both Eyes BID  . sodium chloride flush  3 mL Intravenous Q12H  . spironolactone  12.5 mg Oral Daily    PRN Medications: acetaminophen **OR** acetaminophen, ALPRAZolam, magnesium hydroxide, metoprolol, ondansetron (ZOFRAN) IV, polyvinyl alcohol, sodium chloride   Assessment:   1. Paroxysmal atrial tachycardia. Rhythm remains overall controlled with bursts of atrial tachycardia that are brief and PACs. She has been on amiodarone at 400 mg twice daily since July 12. Decreased yesterday to 200 mg twice daily. Palliative care has seen.  2. Newly diagnosed cardiomyopathy with biventricular failure, LVEF approximately 20%, severe diffuse hypokinesis with akinesis of the inferior and inferolateral myocardium. Also restrictive diastolic filling pattern. She does have coronary artery calcifications by chest CT, so ischemic etiology is possible, but would favor nonischemic cardiomyopathy at this time. She is currently on Coreg, losartan, and Aldactone. These are at low dose, and low blood pressures have limited further titration. Do not anticipate aggressive management with inotropes or invasive cardiac testing. Weight down 6 lbs but only negative 600 cc past 24 hrs, 4700 since admission on Lasix 40 mg IV once daily.  3. Elevated troponin I in a range most consistent with demand ischemia rather than true ACS.  4. Acute pulmonary embolus, currently transitioned from heparin to Eliquis. Management per primary team.  5. Epistaxis requiring nasal packing.   Ermalinda Barrios PA-C  Attending note:  Patient seen and examined. Agree with above assessment by Ms. Bonnell Public PA-C. Ms. Henness is not feeling as well  today. No chest pain or palpitations, but having some dyspnea. Overall stable hemodynamics and rhythm on current regimen. Continues on Coreg, Amiodarone load (cut to 200 mg BID on July 18), Cozaar, Aldactone, Lipitor and Lasix. Had 600 cc out yesterday on lowered Lasix dose. Creatinine stable at 0.9. Continue medical therapy from a cardiac perspective. Palliative Care team has seen patient to discuss goals.  Satira Sark, M.D., F.A.C.C.

## 2016-07-13 LAB — BASIC METABOLIC PANEL
ANION GAP: 10 (ref 5–15)
BUN: 23 mg/dL — AB (ref 6–20)
CO2: 33 mmol/L — ABNORMAL HIGH (ref 22–32)
Calcium: 8.5 mg/dL — ABNORMAL LOW (ref 8.9–10.3)
Chloride: 92 mmol/L — ABNORMAL LOW (ref 101–111)
Creatinine, Ser: 0.9 mg/dL (ref 0.44–1.00)
GFR, EST NON AFRICAN AMERICAN: 55 mL/min — AB (ref >60)
Glucose, Bld: 92 mg/dL (ref 65–99)
POTASSIUM: 3.7 mmol/L (ref 3.5–5.1)
SODIUM: 135 mmol/L (ref 135–145)

## 2016-07-13 NOTE — Progress Notes (Signed)
PROGRESS NOTE    Miranda Ford  [id number] DOB: 04/22/1924 DOA: 07/03/2016 PCP: Purvis Kilts, MD   Outpatient Specialists:   Brief Narrative:  55 yof presented with SOB found to be secondary to acute PE. On admission she was started on anticoagulation. Hospital course complicated by development of significant epistaxis requiring anterior nasal packing. Bleeding has stopped and she has been restarted on anticoagulation. She was also noted to have atrial tachycardia started on amiodarone/coreg with some improvement. Cardiology is following. ECHO with EF of 20%. She has evidence of volume overload, but diuresing is proving challenging with concurrent hypotension. Will request palliative care consult to discuss goals of care.  Assessment & Plan: Active Problems:   Acute pulmonary embolism (HCC)   Tachycardia   Pulmonary embolism (HCC)   Cardiomegaly   Coronary atherosclerosis of native coronary artery   Paroxysmal atrial tachycardia (HCC)   Demand ischemia (HCC)   Acute systolic CHF (congestive heart failure) (HCC)   Acute respiratory failure with hypoxia (HCC)   Secondary cardiomyopathy (HCC)   Biventricular failure (HCC)   Atrial tachycardia (HCC)   Acute congestive heart failure (HCC)   Pulmonary embolism without acute cor pulmonale (HCC)   Epistaxis   Palliative care encounter   Goals of care, counseling/discussion   DNR (do not resuscitate) discussion   1. Acute PE with evidence of right heart strain. Initially started on IV Heparin but had to be discontinued due to significant epistaxis. Nasal packing currently in place. Ultrasound of LE negative for DVT bilaterally. She has not had further evidence of bleeding. Continue Eliquis for now. Appreciate input by palliative care. Patient is DO NOT RESUSCITATE and is agreeable to a natural death when the time comes. Still requires ample amounts of O2. Cont to wean as tolerated 2. Acute epistaxis of right nostril. Resolved.  7/12 patient with significant epistaxis from her right nostril that was noted to soak through multiple gauzes and despite applying pressure continued to bleed. Patient was transported to ED by request of Dr. Lacinda Axon for placement of rhinorocket. Bleeding has resolved and Hgb remains stable. Dr. Roderic Palau had discussed case with Dr. Redmond Baseman (ENT) in [place] who said that nasal packing can be left in place for 3-5 days. He recommended to start the patient on keflex while packing in place. Patient to follow-up as outpatient after discharge to remove packing. 3. Acute respiratory failure with hypoxia. Contiue to wean oxygen as tolerated.Still on high flow O2  4. Elevated troponins: Likely demand ishemia. No complaints of CP.  5. Paroxysmal atrial tachycardia: Improvement with diuresis. Cardiology following and ECHO results as below. Continue amiodarone and coreg. 6. Acute systolic CHF: EF of [id number]. Bilateral pleural effusions noted by chest imaging including CT. Cardiology input appreciated. Continue Coreg. She is on aldactone and low dose losartan. Lasix changed to once a day. Appreciate cardiology input.  7. Coronary atherosclerosis of native coronary artery. No complaints of chest pain. ECHO does show wall motion abnormalities and depressed EF. Further management per Cardiology. Stable   DVT prophylaxis: eliquis  Code Status:  DNR Family Communication: no family present  Disposition Plan: Discharged once improved    Consultants:   Cardiology   ENT, Redmond Baseman (phone per Dr. Roderic Palau)  Procedures:  ECHO  - Left ventricle: The cavity size was mildly dilated. Wall  thickness was increased in a pattern of mild LVH. The estimated  ejection fraction was 20%. Diffuse hypokinesis. There is akinesis  of the lateral, inferolateral, and inferior myocardium. Doppler  parameters are consistent with restrictive physiology, indicative  of decreased left ventricular diastolic compliance and/or  increased left  atrial pressure. - Aortic valve: Mildly calcified annulus. Trileaflet; moderately  thickened, moderately calcified leaflets. There was mild  regurgitation. Mean gradient (S): 5 mm Hg. VTI ratio of LVOT to  aortic valve: 0.35. Valve area (VTI): 1.22 cm^2. Valve area  (Vmax): 1.43 cm^2. Suspect pseudo-stenosis in the setting of  significantly reduced LVEF. - Mitral valve: Calcified annulus. Mildly calcified leaflets .  There was mild regurgitation. - Left atrium: The atrium was mildly dilated. - Right ventricle: Systolic function was moderately to severely  reduced. - Right atrium: Central venous pressure (est): 3 mm Hg. - Atrial septum: No defect or patent foramen ovale was identified. - Tricuspid valve: There was trivial regurgitation. - Pulmonary arteries: PA peak pressure: 20 mm Hg (S). - Pericardium, extracardiac: There was a left pleural effusion.  Rhino rocket right nostril 7/12  Antimicrobials:   Keflex 7/14>>  Subjective: No complaints at this time. Eager to go to rehab  Objective: Filed Vitals:   07/13/16 1600 07/13/16 1700 07/13/16 1746 07/13/16 1800  BP: 116/70 112/90  116/54  Pulse: 35 54  65  Temp:   98.5 F (36.9 C)   TempSrc:   Axillary   Resp: 16 27  19   Height:      Weight:      SpO2: 97% 97%  95%    Intake/Output Summary (Last 24 hours) at 07/13/16 1834 Last data filed at 07/13/16 1833  Gross per 24 hour  Intake    600 ml  Output   1425 ml  Net   -825 ml   Filed Weights   07/11/16 0500 07/12/16 0400 07/13/16 0500  Weight: 65.8 kg (145 lb 1 oz) 63.5 kg (139 lb 15.9 oz) 61.5 kg (135 lb 9.3 oz)    Examination:  General exam: Appears calm and comfortable, nasal packing in right nostril, sitting in bed Respiratory system: diminished breath sounds at bases. Poor inspiratory effort. Cardiovascular system: S1 & S2 heard, RRR. Gastrointestinal system: Abdomen is nondistended, soft and nontender. No organomegaly or masses felt. Normal bowel  sounds heard. Central nervous system: Alert and oriented. No focal neurological deficits. Extremities: Symmetric 5 x 5 power. Skin: No rashes, lesions Psychiatry: Judgement and insight appear normal. Mood & affect appropriate.   Data Reviewed: I have personally reviewed following labs and imaging studies  CBC:  Recent Labs Lab 07/07/16 1015 07/08/16 0441 07/11/16 0403  WBC 11.3* 9.7 8.2  HGB 13.8 13.0 13.0  HCT 42.1 40.3 40.7  MCV 94.4 95.3 94.7  PLT 203 225 0000000   Basic Metabolic Panel:  Recent Labs Lab 07/07/16 1015 07/08/16 0441 07/10/16 0429 07/12/16 0430 07/13/16 0411  NA 131* 136 132* 133* 135  K 4.7 5.0 4.7 4.1 3.7  CL 94* 98* 92* 92* 92*  CO2 29 30 31  32 33*  GLUCOSE 148* 117* 104* 97 92  BUN 29* 22* 29* 23* 23*  CREATININE 1.23* 0.99 0.98 0.93 0.90  CALCIUM 8.5* 8.6* 8.4* 8.5* 8.5*   GFR: Estimated Creatinine Clearance: 35.9 mL/min (by C-G formula based on Cr of 0.9). Liver Function Tests: No results for input(s): AST, ALT, ALKPHOS, BILITOT, PROT, ALBUMIN in the last 168 hours. Coagulation Profile: No results for input(s): INR, PROTIME in the last 168 hours. Cardiac Enzymes: No results for input(s): CKTOTAL, CKMB, CKMBINDEX, TROPONINI in the last 168 hours. Urine analysis:    Component Value Date/Time   COLORURINE YELLOW  07/03/2016 [place] 07/03/2016 1557   LABSPEC 1.010 07/03/2016 1557   PHURINE 5.5 07/03/2016 1557   GLUCOSEU NEGATIVE 07/03/2016 1557   HGBUR NEGATIVE 07/03/2016 [place] 07/03/2016 1557   [place] 07/03/2016 1557   PROTEINUR NEGATIVE 07/03/2016 1557   NITRITE NEGATIVE 07/03/2016 1557   LEUKOCYTESUR NEGATIVE 07/03/2016 1557   Sepsis Labs: @LABRCNTIP (procalcitonin:4,lacticidven:4)  ) No results found for this or any previous visit (from the past 240 hour(s)).  Radiology Studies: No results found. Scheduled Meds: . amiodarone  200 mg Oral BID  . antiseptic oral rinse  7 mL Mouth  Rinse BID  . apixaban  10 mg Oral BID   Followed by  . [START ON 07/14/2016] apixaban  5 mg Oral BID  . atorvastatin  80 mg Oral q1800  . carvedilol  3.125 mg Oral BID WC  . cephALEXin  500 mg Oral Q8H  . feeding supplement (ENSURE ENLIVE)  237 mL Oral BID BM  . furosemide  40 mg Intravenous Daily  . losartan  12.5 mg Oral Daily  . milk and molasses  1 enema Rectal Once  . polyethylene glycol  17 g Oral Daily  . polyvinyl alcohol  1 drop Both Eyes BID  . sodium chloride flush  3 mL Intravenous Q12H  . spironolactone  12.5 mg Oral Daily   Continuous Infusions:     LOS: 10 days     Marylu Lund, MD [facility] If 7PM-7AM, please contact night-coverage [email or web address] Password [facility] 07/13/2016, 6:34 PM

## 2016-07-13 NOTE — Progress Notes (Signed)
Consulting cardiologist: Dr. Dorris Carnes  Seen for followup: Atrial tachycardia, cardiomyopathy  Subjective:    Eating breakfast. Still feels weak but breathing better in general. No chest pain or palpitations.  Objective:   Temp:  [96.9 F (36.1 C)-98.9 F (37.2 C)] 98.9 F (37.2 C) (07/20 0813) Pulse Rate:  [45-70] 49 (07/20 0600) Resp:  [13-27] 17 (07/20 0600) BP: (83-120)/(46-86) 113/51 mmHg (07/20 0600) SpO2:  [87 %-98 %] 92 % (07/20 0600) FiO2 (%):  [60 %] 60 % (07/20 0029) Weight:  [135 lb 9.3 oz (61.5 kg)] 135 lb 9.3 oz (61.5 kg) (07/20 0500) Last BM Date: 07/10/16  Filed Weights   07/11/16 0500 07/12/16 0400 07/13/16 0500  Weight: 145 lb 1 oz (65.8 kg) 139 lb 15.9 oz (63.5 kg) 135 lb 9.3 oz (61.5 kg)    Intake/Output Summary (Last 24 hours) at 07/13/16 0836 Last data filed at 07/13/16 0600  Gross per 24 hour  Intake    960 ml  Output   1400 ml  Net   -440 ml    Telemetry: Sinus rhythm. PACs.  Exam:  General: Elderly woman in no distress.  Lungs: Decreased breath sounds at the bases. No crackles.  Cardiac: Indistinct PMI was RRR no gallop.  Abdomen: NABS.  Extremities: No pitting edema.  Lab Results:  Basic Metabolic Panel:  Recent Labs Lab 07/10/16 0429 07/12/16 0430 07/13/16 0411  NA 132* 133* 135  K 4.7 4.1 3.7  CL 92* 92* 92*  CO2 31 32 33*  GLUCOSE 104* 97 92  BUN 29* 23* 23*  CREATININE 0.98 0.93 0.90  CALCIUM 8.4* 8.5* 8.5*    CBC:  Recent Labs Lab 07/07/16 1015 07/08/16 0441 07/11/16 0403  WBC 11.3* 9.7 8.2  HGB 13.8 13.0 13.0  HCT 42.1 40.3 40.7  MCV 94.4 95.3 94.7  PLT 203 225 261    Echocardiogram 07/04/2016: Study Conclusions  - Left ventricle: The cavity size was mildly dilated. Wall  thickness was increased in a pattern of mild LVH. The estimated  ejection fraction was 20%. Diffuse hypokinesis. There is akinesis  of the lateral, inferolateral, and inferior myocardium. Doppler  parameters are  consistent with restrictive physiology, indicative  of decreased left ventricular diastolic compliance and/or  increased left atrial pressure. - Aortic valve: Mildly calcified annulus. Trileaflet; moderately  thickened, moderately calcified leaflets. There was mild  regurgitation. Mean gradient (S): 5 mm Hg. VTI ratio of LVOT to  aortic valve: 0.35. Valve area (VTI): 1.22 cm^2. Valve area  (Vmax): 1.43 cm^2. Suspect pseudo-stenosis in the setting of  significantly reduced LVEF. - Mitral valve: Calcified annulus. Mildly calcified leaflets .  There was mild regurgitation. - Left atrium: The atrium was mildly dilated. - Right ventricle: Systolic function was moderately to severely  reduced. - Right atrium: Central venous pressure (est): 3 mm Hg. - Atrial septum: No defect or patent foramen ovale was identified. - Tricuspid valve: There was trivial regurgitation. - Pulmonary arteries: PA peak pressure: 20 mm Hg (S). - Pericardium, extracardiac: There was a left pleural effusion.  Impressions:  - Mildly dilated LV chamber size with mild LVH and LVEF  approximately 20%. There is severe diffuse hypokinesis with  akinesis of the inferior and inferolateral myocardium. Probable  restrictive diastolic filling pattern. MAC with calcified mitral  leaflets and mild mitral regurgitation. Mild left atrial  enlargement. Moderately calcified and thickened aortic valve with  reduced cusp excursion and mild aortic regurgitation. Suspect  pseudo-stenosis in the setting of  reduced LVEF. Moderately to  severely reduced RV contraction. Trivial tricuspid regurgitation  with PASP estimated 21 mmHg. Left pleural effusion noted.   Medications:   Scheduled Medications: . amiodarone  200 mg Oral BID  . antiseptic oral rinse  7 mL Mouth Rinse BID  . apixaban  10 mg Oral BID   Followed by  . [START ON 07/14/2016] apixaban  5 mg Oral BID  . atorvastatin  80 mg Oral q1800  . carvedilol   3.125 mg Oral BID WC  . cephALEXin  500 mg Oral Q8H  . feeding supplement (ENSURE ENLIVE)  237 mL Oral BID BM  . furosemide  40 mg Intravenous Daily  . losartan  12.5 mg Oral Daily  . milk and molasses  1 enema Rectal Once  . polyethylene glycol  17 g Oral Daily  . polyvinyl alcohol  1 drop Both Eyes BID  . sodium chloride flush  3 mL Intravenous Q12H  . spironolactone  12.5 mg Oral Daily      PRN Medications:  acetaminophen **OR** acetaminophen, ALPRAZolam, magnesium hydroxide, metoprolol, ondansetron (ZOFRAN) IV, polyvinyl alcohol, sodium chloride   Assessment:   1. Paroxysmal atrial tachycardia. Rhythm remains controlled on amiodarone load currently at 200 mg twice daily since July 12.  2. Newly diagnosed cardiomyopathy with biventricular failure, LVEF approximately 20%, severe diffuse hypokinesis with akinesis of the inferior and inferolateral myocardium. Also restrictive diastolic filling pattern. She does have coronary artery calcifications by chest CT, so ischemic etiology is possible, but would favor nonischemic cardiomyopathy at this time. She is currently on Coreg, losartan, and Aldactone. These are at low dose, and low blood pressures have limited further titration. Do not anticipate aggressive management with inotropes or invasive cardiac testing. Diuresis has decreased on low-dose Lasix but does continue at a reasonable pace. Palliative care has seen to help establish goals of care.  3. Elevated troponin I in a range most consistent with demand ischemia rather than true ACS.  4. Acute pulmonary embolus, currently transitioned from heparin to Eliquis. Management per primary team.  5. Epistaxis requiring nasal packing.    Plan/Discussion:    Continue current dose amiodarone 200 mg twice daily as well as Coreg, Lasix, Cozaar, and Aldactone. Follow-up ECG.   Satira Sark, M.D., F.A.C.C.

## 2016-07-13 NOTE — Care Management Note (Signed)
Case Management Note  Patient Details  Name: Miranda Ford MRN: [id number] Date of Birth: 1924-02-24  Subjective/Objective:   Spoke with patient and husband at the bedside. Patient is alert and oriented from home with spouse. Neither drive but have support of friends and neighbors.  Patient has no assistive devices or DME at home. Does not use O2. Is open to rehab at discharge due to weakness from hospital stay. Awaiting PT evaluation.  Denies issues with home medications.  Will continue to follow for needs.                Action/Plan: Anticipated discharge plan is SNF.  CSW aware. Awaiting PT eval.    Expected Discharge Date:                  Expected Discharge Plan:  [place]  In-House Referral:  Clinical Social Work  Discharge planning Services  CM Consult  Post Acute Care Choice:  IP Rehab Choice offered to:  Patient  DME Arranged:  N/A DME Agency:     HH Arranged:    [place] Agency:     Status of Service:  In process, will continue to follow  If discussed at Long Length of Stay Meetings, dates discussed:    Additional Comments:  Alvie Heidelberg, RN 07/13/2016, 4:29 PM

## 2016-07-13 NOTE — Progress Notes (Signed)
Daily Progress Note   Patient Name: Miranda Ford       Date: 07/13/2016 DOB: [birth year]  Age: 82 y.o. MRN#: [id number] Attending Physician: Donne Hazel, MD Primary Care Physician: Purvis Kilts, MD Admit Date: 07/03/2016  Reason for Consultation/Follow-up: Disposition, Establishing goals of care and Psychosocial/spiritual support  Subjective: Miranda Ford is sitting quietly in her [place] chair, she makes eye contact and greets me as I enter.  She has no complaints, stating that she feels better today. She does say that she is ready to have the nasal packing (rocket) removed. She states that she feels that she would be able to breathe better, eat better, and sleep better with the packing removed. She states that she is been told that she must go to [facility] to an ENT to have the rocket removed. I share with her that if she goes to a rehab for physical therapy, they will assist her in setting up an appointment and also with transportation to the appointment. We talk about working to reduce her oxygen flow rate.  Miranda Ford tells me that she has given serious thought about disposition after discharge. She shares that she feels that inpatient rehab may be the best option for her. She shares that if she were to go to her own home, neither she nor her husband would get rest. Incurred her to consider her options for local providers, and give [place] her 1st, 2nd and 3rd choices. I share that social worker will continue to work with her about cost and availability. Her husband enters the room during this conversation. He has no questions or concerns. I will continue to follow Miranda Ford as needed, but I feel that they have good plan.  Length of Stay: 10  Current Medications: Scheduled Meds:  .  amiodarone  200 mg Oral BID  . antiseptic oral rinse  7 mL Mouth Rinse BID  . apixaban  10 mg Oral BID   Followed by  . [START ON 07/14/2016] apixaban  5 mg Oral BID  . atorvastatin  80 mg Oral q1800  . carvedilol  3.125 mg Oral BID WC  . cephALEXin  500 mg Oral Q8H  . feeding supplement (ENSURE ENLIVE)  237 mL Oral BID BM  . furosemide  40 mg Intravenous Daily  . losartan  12.5 mg Oral Daily  . milk and molasses  1 enema Rectal Once  . polyethylene glycol  17 g Oral Daily  . polyvinyl alcohol  1 drop Both Eyes BID  . sodium chloride flush  3 mL Intravenous Q12H  . spironolactone  12.5 mg Oral Daily    Continuous Infusions:    PRN Meds: acetaminophen **OR** acetaminophen, ALPRAZolam, magnesium hydroxide, metoprolol, ondansetron (ZOFRAN) IV, polyvinyl alcohol, sodium chloride  Physical Exam  Constitutional: She is oriented to person, place, and time. No distress.  HENT:  Head: Normocephalic and atraumatic.  Cardiovascular:  Rate in the 50s at times  Pulmonary/Chest: Effort normal. No respiratory distress.  Hi flow nasal cannula, one nare obstructed  Abdominal: Soft. She exhibits no distension. There is no guarding.  Neurological: She is alert and oriented to person, place, and time.  Skin: Skin is warm and dry.  Psychiatric: She has a normal mood and affect. Judgment and thought content normal.  Nursing note and vitals reviewed.           Vital Signs: BP 116/64 mmHg  Pulse 133  Temp(Src) 98.9 F (37.2 C) (Axillary)  Resp 17  Ht 5' 3.5" (1.613 m)  Wt 61.5 kg (135 lb 9.3 oz)  BMI 23.64 kg/m2  SpO2 100% SpO2: SpO2: 100 % O2 Device: O2 Device: High Flow Nasal Cannula O2 Flow Rate: O2 Flow Rate (L/min): 8 L/min  Intake/output summary:  Intake/Output Summary (Last 24 hours) at 07/13/16 1539 Last data filed at 07/13/16 1400  Gross per 24 hour  Intake    720 ml  Output   1225 ml  Net   -505 ml   LBM: Last BM Date: 07/12/16 Baseline Weight: Weight: 64.864 kg (143  lb) Most recent weight: Weight: 61.5 kg (135 lb 9.3 oz)       Palliative Assessment/Data:    Flowsheet Rows        Most Recent Value   Intake Tab    Referral Department  Hospitalist   Unit at Time of Referral  ICU   Palliative Care Primary Diagnosis  Pulmonary   Date Notified  07/11/16   Palliative Care Type  New Palliative care   Reason for referral  Clarify Goals of Care   Date of Admission  07/03/16   Date first seen by Palliative Care  07/11/16   # of days Palliative referral response time  0 Day(s)   # of days IP prior to Palliative referral  8   Clinical Assessment    Palliative Performance Scale Score  60%   Pain Max last 24 hours  2   Pain Min Last 24 hours  1   Dyspnea Min Last 24 hours  0   Nausea Max Last 24 Hours  0   Psychosocial & Spiritual Assessment    Palliative Care Outcomes    Patient/Family meeting held?  Yes   Who was at the meeting?  Patient and husband Miranda Ford, meeting scheduled for tomorrow with niece [name]   Palliative Care Outcomes  Clarified goals of care, Provided advance care planning, Provided psychosocial or spiritual support   Patient/Family wishes: Interventions discontinued/not started   Mechanical Ventilation, Tube feedings/TPN   Palliative Care follow-up planned  -- [Follow-up while at [facility]]      Patient Active Problem List   Diagnosis Date Noted  . DNR (do not resuscitate) discussion   . Palliative care encounter   . Goals of care, counseling/discussion   . Epistaxis 07/07/2016  . Pulmonary embolism  without acute cor pulmonale ([place])   . Acute systolic CHF (congestive heart failure) ([place]) 07/05/2016  . Acute respiratory failure with hypoxia ([place]) 07/05/2016  . Secondary cardiomyopathy ([place])   . Biventricular failure ([place])   . Atrial tachycardia ([place])   . Acute congestive heart failure ([place])   . Paroxysmal atrial tachycardia ([place])   . Demand ischemia ([place])   . Acute pulmonary embolism ([place]) 07/03/2016  . Tachycardia 07/03/2016  .  Pulmonary embolism ([place]) 07/03/2016  . COPD exacerbation ([place]) 07/03/2016  . Cardiomegaly 07/03/2016  . Coronary atherosclerosis of native coronary artery 07/03/2016  . Elevated troponin     Palliative Care Assessment & Plan   Patient Profile: 82 y.o. female with past medical history of Osteoporosis, COPD?, cataracts, but otherwise benign health history admitted on 07/03/2016 with shortness of breath, PE.   Assessment: as above  Recommendations/Plan:  continue to treat the treatable, but no extraordinary measures such as CPR or intubation. Considering options for rehab at home versus skilled nursing facility.  Goals of Care and Additional Recommendations:  Limitations on Scope of Treatment: As above, continue to treat the treatable but no extraordinary measures.  Code Status:    Code Status Orders        Start     Ordered   07/06/16 0946  Do not attempt resuscitation (DNR)   Continuous    Question Answer Comment  In the event of cardiac or respiratory ARREST Do not call a "code blue"   In the event of cardiac or respiratory ARREST Do not perform Intubation, CPR, defibrillation or ACLS   In the event of cardiac or respiratory ARREST Use medication by any route, position, wound care, and other measures to relive pain and suffering. May use oxygen, suction and manual treatment of airway obstruction as needed for comfort.      07/06/16 0945    Code Status History    Date Active Date Inactive Code Status Order ID Comments User Context   07/03/2016  5:24 PM 07/06/2016  9:45 AM Full Code [id number]  Jani Gravel, MD Inpatient    Advance Directive Documentation        Most Recent Value   Type of Advance Directive  Living will   Pre-existing out of facility DNR order (yellow form or pink MOST form)     "MOST" Form in Place?         Prognosis:   Unable to determine, based on outcomes, functional status  Discharge Planning:  Mrs. Alarie states today she is seriously  considering inpatient rehab  Care plan was discussed with Nursing staff, case manager, social worker, and Dr. Wyline Copas on next rounds  Thank you for allowing the Palliative Medicine Team to assist in the care of this patient.   Time In: 1415 Time Out: 1435 Total Time 20 minutes  Prolonged Time Billed  no       Greater than 50%  of this time was spent counseling and coordinating care related to the above assessment and plan.  Jamarious Febo A, NP  Please contact Palliative Medicine Team phone at [phone] for questions and concerns.

## 2016-07-13 NOTE — Progress Notes (Signed)
Initial Nutrition Assessment  DOCUMENTATION CODES:  Not applicable  INTERVENTION:  Continue Ensure Enlive at this time.   Continue to Monitor Po intake and if pt's intake worsens can change interventions.   NUTRITION DIAGNOSIS:  Inadequate oral intake related to poor appetite+constiaption as evidenced by meal completion < 50% of meals  GOAL:  Patient will meet greater than or equal to 90% of their needs  MONITOR:  PO intake, Supplement acceptance, Labs, Weight trends  REASON FOR ASSESSMENT:  LOS    ASSESSMENT:  82 y/o female PMHx osteoporosis, macular degeneration. She had initially presented with SOB x3 days. Worked up for PE, CHF. Course complicated by hypotension, conflicting management of CHF and renal fx, and anticoagulation induced epistaxis that has been difficult to manage.   Pt was seen by this RD last week. She had reported at that time her poor PO intake was because she was impacted: "I just couldn't hold anymore". Early today she took some MOM to affect and now reports much relief. She proceeded to eat lunch after that.   Pt has now been admitted 10 days. Per chart review, pt's intake has been typically between 25-50%. She has ensure enlive ordered, though 2 unopened bottles are seen at bedside, unsure how much she has consumed during admission.  Today pt reports that she thinks she is improving overall, but does note that she "isnt hungry". Decreased appetite likely result of her acute illnesses, medications, and general age related anorexia.   Though pt is eating <50% of meals, she does not require 100% of each meal to meet her estimated needs, therefore does not meet criteria for malnutrition.   Pt mainly was focused on her plans for discharge and says she is leaning towards SNF for 20 days to get her strength back. She says if she goes home right from the hospital, she wont be able to take care of herself and there is no one to help her as her husband is 95 and  unable to aide.   RD helped her set up her tray for lunch.   Her weight has decreased by over 20 lbs since last week. She was admitted at weight of 143. She stated normal weight was 140 lbs.  She was noted to have bilateral pleural effusions. She had gained some weight from edema and has been significantly diuresed.   NFPE: thought to be age related vs malnutrition   Meds: Lasix, Miralax, MOM  Labs reviewed:  CBGs 90-150    Recent Labs Lab 07/10/16 0429 07/12/16 0430 07/13/16 0411  NA 132* 133* 135  K 4.7 4.1 3.7  CL 92* 92* 92*  CO2 31 32 33*  BUN 29* 23* 23*  CREATININE 0.98 0.93 0.90  CALCIUM 8.4* 8.5* 8.5*  GLUCOSE 104* 97 92   Diet Order:  Diet Heart Room service appropriate?: Yes; Fluid consistency:: Thin  Skin:  Reviewed, no issues  Last BM:  7/19  Height:  Ht Readings from Last 1 Encounters:  07/03/16 5' 3.5" (1.613 m)   Weight:  Wt Readings from Last 1 Encounters:  07/13/16 135 lb 9.3 oz (61.5 kg)   Wt Readings from Last 10 Encounters:  07/13/16 135 lb 9.3 oz (61.5 kg)  07/01/13 140 lb (63.504 kg)   Ideal Body Weight:  53.41 kg  BMI:  Body mass index is 23.64 kg/(m^2).  Estimated Nutritional Needs:  Kcal:  1500-1650 kcals (24-27 kcal/kg bw) Protein:  62-74 (1-1.2 g/kg bw) Fluid:  1.5-1.7 liters liters  EDUCATION  NEEDS:  No education needs identified at this time  Burtis Junes RD, LDN, [place] [facility] Pager: [phone] 07/13/2016 2:01 PM

## 2016-07-14 LAB — BASIC METABOLIC PANEL
Anion gap: 9 (ref 5–15)
BUN: 21 mg/dL — AB (ref 6–20)
CHLORIDE: 95 mmol/L — AB (ref 101–111)
CO2: 33 mmol/L — AB (ref 22–32)
CREATININE: 0.92 mg/dL (ref 0.44–1.00)
Calcium: 8.8 mg/dL — ABNORMAL LOW (ref 8.9–10.3)
GFR calc Af Amer: >60 mL/min (ref >60)
GFR calc non Af Amer: 54 mL/min — ABNORMAL LOW (ref >60)
GLUCOSE: 93 mg/dL (ref 65–99)
POTASSIUM: 3.9 mmol/L (ref 3.5–5.1)
SODIUM: 137 mmol/L (ref 135–145)

## 2016-07-14 LAB — CBC
HEMATOCRIT: 42.9 % (ref 36.0–46.0)
HEMOGLOBIN: 14 g/dL (ref 12.0–15.0)
MCH: 30.4 pg (ref 26.0–34.0)
MCHC: 32.6 g/dL (ref 30.0–36.0)
MCV: 93.3 fL (ref 78.0–100.0)
Platelets: 264 10*3/uL (ref 150–400)
RBC: 4.6 MIL/uL (ref 3.87–5.11)
RDW: 13.7 % (ref 11.5–15.5)
WBC: 9.9 10*3/uL (ref 4.0–10.5)

## 2016-07-14 NOTE — Evaluation (Signed)
Physical Therapy Evaluation Patient Details Name: Miranda Ford MRN: [id number] DOB: [birth year] Today's Date: 07/14/2016   History of Present Illness  82 yo F admitted 07/03/2016 due to SOB with CTA showing single acute PE, bilateral moderate sized pleural effusions. New diagnosis of cardiomyopathy with biventricular failure and LVEF approximately 20%, paroxysmal atrial tachycardia requiring amiodarone for rate control, elevated troponin consistent with demand ischemia. Had started heparin, but d/c due to epistaxis from R nostril requiring rhinorocket (07/06/2016), now on Eliquis. On HFNC 4L. PMH: osteoporosis, macular degeneration, cholecystectomy  Clinical Impression  Pt received in bed, husband present, and pt is agreeable to PT evaluation.  Pt states that she was independent with all functional mobility, ADL's, and IADL's prior to admission.  She lives with her husband who uses a rollator walker.  During today's PT evaluation she demonstrated need for min guard for transfers sit<>stand, as well as min guard for ambulation 2x69ft with RW.  Pt was limited due to fatigue.  At this point, pt is recommended for SNF to improve strength, balance and endurance.     Follow Up Recommendations SNF    Equipment Recommendations  None recommended by PT    Recommendations for Other Services       Precautions / Restrictions Precautions Precautions: None Restrictions Weight Bearing Restrictions: No      Mobility  Bed Mobility Overal bed mobility: Needs Assistance Bed Mobility: Supine to Sit     Supine to sit: Supervision;HOB elevated        Transfers Overall transfer level: Needs assistance Equipment used: Rolling walker (2 wheeled) Transfers: Sit to/from Stand Sit to Stand: Min guard            Ambulation/Gait Ambulation/Gait assistance: Min guard Ambulation Distance (Feet): 20 Feet (x2) Assistive device: Rolling walker (2 wheeled) Gait Pattern/deviations: Step-through  pattern     General Gait Details: Limited distance due to fatigue.   Stairs            Wheelchair Mobility    Modified Rankin (Stroke Patients Only)       Balance Overall balance assessment: Needs assistance         Standing balance support: Bilateral upper extremity supported Standing balance-Leahy Scale: Fair                               Pertinent Vitals/Pain Pain Assessment: No/denies pain    Home Living   Living Arrangements: Spouse/significant other Available Help at Discharge:  (niece available if needed. Neighbors have expressed that they want to help; ) Type of Home: House Home Access: Ramped entrance     Home Layout: One level Home Equipment: Shower seat;Bedside commode Additional Comments: both are pt's husband's, who is significantly taller than she is.     Prior Function     Gait / Transfers Assistance Needed: Pt was independent with ambulation  ADL's / Homemaking Assistance Needed: Pt was independent with ADL's, and IADL's, driving, and mowing the lawn on a riding mower.         Hand Dominance   Dominant Hand: Right    Extremity/Trunk Assessment   Upper Extremity Assessment: Overall WFL for tasks assessed           Lower Extremity Assessment: Generalized weakness         Communication   Communication: No difficulties  Cognition Arousal/Alertness: Awake/alert Behavior During Therapy: WFL for tasks assessed/performed Overall Cognitive Status: Within Functional Limits for tasks  assessed                      General Comments      Exercises        Assessment/Plan    PT Assessment Patient needs continued PT services  PT Diagnosis Difficulty walking;Abnormality of gait;Generalized weakness   PT Problem List Decreased strength;Decreased activity tolerance;Decreased balance;Decreased mobility;Decreased knowledge of use of DME;Cardiopulmonary status limiting activity  PT Treatment Interventions DME  instruction;Gait training;Stair training;Functional mobility training;Therapeutic activities;Therapeutic exercise;Patient/family education   PT Goals (Current goals can be found in the Care Plan section) Acute Rehab PT Goals Patient Stated Goal: get stronger and go back home.  PT Goal Formulation: With patient Time For Goal Achievement: 07/21/16 Potential to Achieve Goals: Good    Frequency Min 3X/week   Barriers to discharge Decreased caregiver support      Co-evaluation               End of Session Equipment Utilized During Treatment: Gait belt;Oxygen Activity Tolerance: Patient tolerated treatment well Patient left: in chair;with family/visitor present Nurse Communication: Mobility status    Functional Assessment Tool Used: [employer] "6-clicks"  Functional Limitation: Mobility: Walking and moving around Mobility: Walking and Moving Around Current Status [phone]): At least 20 percent but less than 40 percent impaired, limited or restricted Mobility: Walking and Moving Around Goal Status [phone]): At least 1 percent but less than 20 percent impaired, limited or restricted    Time: [id number] PT Time Calculation (min) (ACUTE ONLY): 40 min   Charges:     PT Treatments $Gait Training: 23-37 mins   PT G Codes:   PT G-Codes **NOT FOR INPATIENT CLASS** Functional Assessment Tool Used: [employer] "6-clicks"  Functional Limitation: Mobility: Walking and moving around Mobility: Walking and Moving Around Current Status [phone]): At least 20 percent but less than 40 percent impaired, limited or restricted Mobility: Walking and Moving Around Goal Status [phone]): At least 1 percent but less than 20 percent impaired, limited or restricted    Beth Birdena Kingma, PT, DPT X: [phone]

## 2016-07-14 NOTE — Clinical Social Work Note (Signed)
Clinical Social Work Assessment  Patient Details  Name: Miranda Ford MRN: [id number] Date of Birth: Jun 17, 1924  Date of referral:  07/14/16               Reason for consult:  Facility Placement                Permission sought to share information with:    Permission granted to share information::     Name::        Agency::     Relationship::     Contact Information:  spouse and neice were at bedside. They are both listed on patient chart.   Housing/Transportation Living arrangements for the past 2 months:  [place] of Information:  Patient Patient Interpreter Needed:  None Criminal Activity/Legal Involvement Pertinent to Current Situation/Hospitalization:  No - Comment as needed Significant Relationships:  Spouse Lives with:  Spouse Do you feel safe going back to the place where you live?  Yes Need for family participation in patient care:  Yes (Comment)  Care giving concerns:  None identified.    Social Worker assessment / plan:  Patient advised that at baseline she is independent. She stated that she was mowing her yard and driving the Friday prior to becoming ill. CSW discussed the possibility of SNF recommendation. Patient was agreeable to SNF. She advised that she preferred [facility] but would go to [facility] if no bed was available. Patient also requested a three day stay.  CSW discussed the need for a three day stay as well as the recommendation from PT.  Patient verbalized understanding.   Employment status:  Retired Forensic scientist:  Medicare PT Recommendations:  [place] / Referral to community resources:  [place]  Patient/Family's Response to care:  Patient agreeable to go to SNF.  Patient/Family's Understanding of and Emotional Response to Diagnosis, Current Treatment, and Prognosis:  Patient understands her diagnoisis, treatment and prognosis.   Emotional Assessment Appearance:  Appears stated  age Attitude/Demeanor/Rapport:   (Cooperative) Affect (typically observed):  Calm Orientation:  Oriented to Self, Oriented to Place, Oriented to  Time, Oriented to Situation Alcohol / Substance use:  Not Applicable Psych involvement (Current and /or in the community):  No (Comment)  Discharge Needs  Concerns to be addressed:  Discharge Planning Concerns Readmission within the last 30 days:  No Current discharge risk:  None Barriers to Discharge:  No Barriers Identified   Ihor Gully, LCSW 07/14/2016, 2:59 PM

## 2016-07-14 NOTE — NC FL2 (Signed)
[place] LEVEL OF CARE SCREENING TOOL     IDENTIFICATION  Patient Name: Miranda Ford Birthdate: Jul 20, 1924 Sex: female Admission Date (Current Location): 07/03/2016  [facility] and [place] Number:  [employer] and Address:  [place] [address] [place]      Provider Number: [id number]  Attending Physician Name and Address:  Donne Hazel, MD  Relative Name and Phone Number:       Current Level of Care: Hospital Recommended Level of Care: [place] Prior Approval Number:    Date Approved/Denied:   [place] Number: [id number] ([id number])  Discharge Plan: SNF    Current Diagnoses: Patient Active Problem List   Diagnosis Date Noted  . DNR (do not resuscitate) discussion   . Palliative care encounter   . Goals of care, counseling/discussion   . Epistaxis 07/07/2016  . Pulmonary embolism without acute cor pulmonale ([place])   . Acute systolic CHF (congestive heart failure) ([place]) 07/05/2016  . Acute respiratory failure with hypoxia ([place]) 07/05/2016  . Secondary cardiomyopathy ([place])   . Biventricular failure ([place])   . Atrial tachycardia ([place])   . Acute congestive heart failure ([place])   . Paroxysmal atrial tachycardia ([place])   . Demand ischemia ([place])   . Acute pulmonary embolism ([place]) 07/03/2016  . Tachycardia 07/03/2016  . Pulmonary embolism ([place]) 07/03/2016  . COPD exacerbation ([place]) 07/03/2016  . Cardiomegaly 07/03/2016  . Coronary atherosclerosis of native coronary artery 07/03/2016  . Elevated troponin     Orientation RESPIRATION BLADDER Height & Weight     Self, Time, Situation, Place  O2 (HFNC 7L) Continent Weight: 137 lb 9.1 oz (62.4 kg) Height:  5' 3.5" (161.3 cm)  BEHAVIORAL SYMPTOMS/MOOD NEUROLOGICAL BOWEL NUTRITION STATUS      Continent Diet (Heart Healthy)  AMBULATORY STATUS COMMUNICATION OF NEEDS Skin   Limited Assist Verbally Normal                       Personal Care  Assistance Level of Assistance  Bathing, Feeding, Dressing Bathing Assistance: Limited assistance Feeding assistance: Independent Dressing Assistance: Limited assistance     Functional Limitations Info  Sight, Hearing, Speech Sight Info: Adequate Hearing Info: Adequate Speech Info: Adequate    SPECIAL CARE FACTORS FREQUENCY  PT (By licensed PT)     PT Frequency: eval pending              Contractures      Additional Factors Info  Code Status, Allergies Code Status Info: DNR Allergies Info: Codeine, Penicillins           Current Medications (07/14/2016):  This is the current hospital active medication list Current Facility-Administered Medications  Medication Dose Route Frequency Provider Last Rate Last Dose  . acetaminophen (TYLENOL) tablet 650 mg  650 mg Oral Q6H PRN Jani Gravel, MD   650 mg at 07/03/16 2353   Or  . acetaminophen (TYLENOL) suppository 650 mg  650 mg Rectal Q6H PRN Jani Gravel, MD      . ALPRAZolam Duanne Moron) tablet 0.5 mg  0.5 mg Oral BID PRN Kathie Dike, MD   0.5 mg at 07/09/16 1011  . amiodarone (PACERONE) tablet 200 mg  200 mg Oral BID Satira Sark, MD   200 mg at 07/14/16 0827  . antiseptic oral rinse (CPC / CETYLPYRIDINIUM CHLORIDE 0.05%) solution 7 mL  7 mL Mouth Rinse BID Jani Gravel, MD   7 mL at 07/14/16 0828  .  apixaban (ELIQUIS) tablet 5 mg  5 mg Oral BID Kathie Dike, MD   5 mg at 07/14/16 0827  . atorvastatin (LIPITOR) tablet 80 mg  80 mg Oral q1800 Jani Gravel, MD   80 mg at 07/13/16 1634  . carvedilol (COREG) tablet 3.125 mg  3.125 mg Oral BID WC Kathie Dike, MD   3.125 mg at 07/14/16 0827  . cephALEXin (KEFLEX) capsule 500 mg  500 mg Oral Q8H Kathie Dike, MD   500 mg at 07/14/16 1402  . feeding supplement (ENSURE ENLIVE) (ENSURE ENLIVE) liquid 237 mL  237 mL Oral BID BM Kathie Dike, MD   237 mL at 07/14/16 1404  . furosemide (LASIX) injection 40 mg  40 mg Intravenous Daily Kathie Dike, MD   40 mg at 07/14/16 0826  . losartan  (COZAAR) tablet 12.5 mg  12.5 mg Oral Daily Satira Sark, MD   12.5 mg at 07/14/16 0830  . magnesium hydroxide (MILK OF MAGNESIA) suspension 30 mL  30 mL Oral Daily PRN Lendon Colonel, NP   30 mL at 07/07/16 0905  . metoprolol (LOPRESSOR) injection 5 mg  5 mg Intravenous Q6H PRN Fay Records, MD   5 mg at 07/05/16 2032  . milk and molasses enema  1 enema Rectal Once Kathie Dike, MD   Stopped at 07/07/16 1252  . ondansetron (ZOFRAN) injection 4 mg  4 mg Intravenous Q6H PRN Kathie Dike, MD   4 mg at 07/09/16 1004  . polyethylene glycol (MIRALAX / GLYCOLAX) packet 17 g  17 g Oral Daily Kathie Dike, MD   17 g at 07/14/16 0826  . polyvinyl alcohol (LIQUIFILM TEARS) 1.4 % ophthalmic solution 1 drop  1 drop Both Eyes PRN Jani Gravel, MD   1 drop at 07/04/16 0908  . polyvinyl alcohol (LIQUIFILM TEARS) 1.4 % ophthalmic solution 1 drop  1 drop Both Eyes BID Kathie Dike, MD   1 drop at 07/14/16 0831  . sodium chloride (OCEAN) 0.65 % nasal spray 1 spray  1 spray Each Nare PRN Kathie Dike, MD   1 spray at 07/05/16 1754  . sodium chloride flush (NS) 0.9 % injection 3 mL  3 mL Intravenous Q12H Jani Gravel, MD   3 mL at 07/14/16 0828  . spironolactone (ALDACTONE) tablet 12.5 mg  12.5 mg Oral Daily Satira Sark, MD   12.5 mg at 07/14/16 [id number]     Discharge Medications: Please see discharge summary for a list of discharge medications.  Relevant Imaging Results:  Relevant Lab Results:   Additional Information    [place] Hollar, Clydene Pugh, LCSW

## 2016-07-14 NOTE — Progress Notes (Signed)
ANTICOAGULATION CONSULT NOTE - follow up  Pharmacy Consult for Eliquis (apixaban) Indication: pulmonary embolus  Allergies  Allergen Reactions  . Codeine Nausea Only  . Penicillins Other (See Comments)    "Not allergic just a lot of my family members are allergic". Has patient had a PCN reaction causing immediate rash, facial/tongue/throat swelling, SOB or lightheadedness with hypotension: No Has patient had a PCN reaction causing severe rash involving mucus membranes or skin necrosis: No Has patient had a PCN reaction that required hospitalization No Has patient had a PCN reaction occurring within the last 10 years: No If all of the above answers are "NO", then may proceed with Cephalosporin use.    Patient Measurements: Height: 5' 3.5" (161.3 cm) Weight: 137 lb 9.1 oz (62.4 kg) IBW/kg (Calculated) : 53.55  Vital Signs: Temp: 100.4 F (38 C) (07/21 0800) Temp Source: Axillary (07/21 0400) BP: 114/54 mmHg (07/21 0900) Pulse Rate: 69 (07/21 0900)  Labs:  Recent Labs  07/12/16 0430 07/13/16 0411 07/14/16 0453  HGB  --   --  14.0  HCT  --   --  42.9  PLT  --   --  264  CREATININE 0.93 0.90 0.92   Estimated Creatinine Clearance: 35.1 mL/min (by C-G formula based on Cr of 0.92).  Medical History: Past Medical History  Diagnosis Date  . Osteoporosis   . Cataracts, bilateral   . Macular degeneration    Medications:  Scheduled:  . amiodarone  200 mg Oral BID  . antiseptic oral rinse  7 mL Mouth Rinse BID  . apixaban  5 mg Oral BID  . atorvastatin  80 mg Oral q1800  . carvedilol  3.125 mg Oral BID WC  . cephALEXin  500 mg Oral Q8H  . feeding supplement (ENSURE ENLIVE)  237 mL Oral BID BM  . furosemide  40 mg Intravenous Daily  . losartan  12.5 mg Oral Daily  . milk and molasses  1 enema Rectal Once  . polyethylene glycol  17 g Oral Daily  . polyvinyl alcohol  1 drop Both Eyes BID  . sodium chloride flush  3 mL Intravenous Q12H  . spironolactone  12.5 mg Oral  Daily   Assessment: 82 y.o. female who presents to the Emergency Department complaining of intermittent episodes of shortness of breath x 3 days.  Pt was on IV Heparin previously.  Pharmacy consult for PE, transitioning treatment to eliquis. She has rhino rocket in place and RN notes patient dabs at nose and has bloodly kleenex.  Hg appears stable   Goal of Therapy:  Monitor platelets by anticoagulation protocol: Yes   Plan:  Eliquis (apixaban) 10mg  po BID for 7 days, then 5mg  po BID Continue to monitor H&H and platelets Monitor for nose bleeds, s/sx of bleeding complications  Thanks for allowing pharmacy to be a part of this patient's care.  Excell Seltzer, PharmD Clinical Pharmacist   07/14/2016 10:43 AM

## 2016-07-14 NOTE — NC FL2 (Deleted)
[place] LEVEL OF CARE SCREENING TOOL     IDENTIFICATION  Patient Name: Miranda Ford Birthdate: Apr 07, 1924 Sex: female Admission Date (Current Location): 07/03/2016  [facility] and [place] Number:  [employer] and Address:  [place] [address] [place]      Provider Number: [id number]  Attending Physician Name and Address:  Donne Hazel, MD  Relative Name and Phone Number:       Current Level of Care: Hospital Recommended Level of Care: [place] Prior Approval Number:    Date Approved/Denied:   PASRR Number:    Discharge Plan: SNF    Current Diagnoses: Patient Active Problem List   Diagnosis Date Noted  . DNR (do not resuscitate) discussion   . Palliative care encounter   . Goals of care, counseling/discussion   . Epistaxis 07/07/2016  . Pulmonary embolism without acute cor pulmonale ([place])   . Acute systolic CHF (congestive heart failure) ([place]) 07/05/2016  . Acute respiratory failure with hypoxia ([place]) 07/05/2016  . Secondary cardiomyopathy ([place])   . Biventricular failure ([place])   . Atrial tachycardia ([place])   . Acute congestive heart failure ([place])   . Paroxysmal atrial tachycardia ([place])   . Demand ischemia ([place])   . Acute pulmonary embolism ([place]) 07/03/2016  . Tachycardia 07/03/2016  . Pulmonary embolism ([place]) 07/03/2016  . COPD exacerbation ([place]) 07/03/2016  . Cardiomegaly 07/03/2016  . Coronary atherosclerosis of native coronary artery 07/03/2016  . Elevated troponin     Orientation RESPIRATION BLADDER Height & Weight     Self, Time, Situation, Place  O2 (HFNC 7L) Continent Weight: 137 lb 9.1 oz (62.4 kg) Height:  5' 3.5" (161.3 cm)  BEHAVIORAL SYMPTOMS/MOOD NEUROLOGICAL BOWEL NUTRITION STATUS      Continent Diet (Heart Healthy)  AMBULATORY STATUS COMMUNICATION OF NEEDS Skin   Limited Assist Verbally Normal                       Personal Care Assistance Level of  Assistance  Bathing, Feeding, Dressing Bathing Assistance: Limited assistance Feeding assistance: Independent Dressing Assistance: Limited assistance     Functional Limitations Info  Sight, Hearing, Speech Sight Info: Adequate Hearing Info: Adequate Speech Info: Adequate    SPECIAL CARE FACTORS FREQUENCY  PT (By licensed PT)     PT Frequency: eval pending              Contractures      Additional Factors Info  Code Status, Allergies Code Status Info: DNR Allergies Info: Codeine, Penicillins           Current Medications (07/14/2016):  This is the current hospital active medication list Current Facility-Administered Medications  Medication Dose Route Frequency Provider Last Rate Last Dose  . acetaminophen (TYLENOL) tablet 650 mg  650 mg Oral Q6H PRN Jani Gravel, MD   650 mg at 07/03/16 2353   Or  . acetaminophen (TYLENOL) suppository 650 mg  650 mg Rectal Q6H PRN Jani Gravel, MD      . ALPRAZolam Duanne Moron) tablet 0.5 mg  0.5 mg Oral BID PRN Kathie Dike, MD   0.5 mg at 07/09/16 1011  . amiodarone (PACERONE) tablet 200 mg  200 mg Oral BID Satira Sark, MD   200 mg at 07/14/16 0827  . antiseptic oral rinse (CPC / CETYLPYRIDINIUM CHLORIDE 0.05%) solution 7 mL  7 mL Mouth Rinse BID Jani Gravel, MD   7 mL at 07/14/16 0828  .  apixaban (ELIQUIS) tablet 5 mg  5 mg Oral BID Kathie Dike, MD   5 mg at 07/14/16 0827  . atorvastatin (LIPITOR) tablet 80 mg  80 mg Oral q1800 Jani Gravel, MD   80 mg at 07/13/16 1634  . carvedilol (COREG) tablet 3.125 mg  3.125 mg Oral BID WC Kathie Dike, MD   3.125 mg at 07/14/16 0827  . cephALEXin (KEFLEX) capsule 500 mg  500 mg Oral Q8H Kathie Dike, MD   500 mg at 07/14/16 0609  . feeding supplement (ENSURE ENLIVE) (ENSURE ENLIVE) liquid 237 mL  237 mL Oral BID BM Kathie Dike, MD   237 mL at 07/14/16 0828  . furosemide (LASIX) injection 40 mg  40 mg Intravenous Daily Kathie Dike, MD   40 mg at 07/14/16 0826  . losartan (COZAAR) tablet  12.5 mg  12.5 mg Oral Daily Satira Sark, MD   12.5 mg at 07/14/16 0830  . magnesium hydroxide (MILK OF MAGNESIA) suspension 30 mL  30 mL Oral Daily PRN Lendon Colonel, NP   30 mL at 07/07/16 0905  . metoprolol (LOPRESSOR) injection 5 mg  5 mg Intravenous Q6H PRN Fay Records, MD   5 mg at 07/05/16 2032  . milk and molasses enema  1 enema Rectal Once Kathie Dike, MD   Stopped at 07/07/16 1252  . ondansetron (ZOFRAN) injection 4 mg  4 mg Intravenous Q6H PRN Kathie Dike, MD   4 mg at 07/09/16 1004  . polyethylene glycol (MIRALAX / GLYCOLAX) packet 17 g  17 g Oral Daily Kathie Dike, MD   17 g at 07/14/16 0826  . polyvinyl alcohol (LIQUIFILM TEARS) 1.4 % ophthalmic solution 1 drop  1 drop Both Eyes PRN Jani Gravel, MD   1 drop at 07/04/16 0908  . polyvinyl alcohol (LIQUIFILM TEARS) 1.4 % ophthalmic solution 1 drop  1 drop Both Eyes BID Kathie Dike, MD   1 drop at 07/14/16 0831  . sodium chloride (OCEAN) 0.65 % nasal spray 1 spray  1 spray Each Nare PRN Kathie Dike, MD   1 spray at 07/05/16 1754  . sodium chloride flush (NS) 0.9 % injection 3 mL  3 mL Intravenous Q12H Jani Gravel, MD   3 mL at 07/14/16 0828  . spironolactone (ALDACTONE) tablet 12.5 mg  12.5 mg Oral Daily Satira Sark, MD   12.5 mg at 07/14/16 [id number]     Discharge Medications: Please see discharge summary for a list of discharge medications.  Relevant Imaging Results:  Relevant Lab Results:   Additional Information    Trevonte Ashkar, Clydene Pugh, LCSW

## 2016-07-14 NOTE — Progress Notes (Addendum)
Consulting Cardiologist: Dorris Carnes   Cardiology Specific Problem List: 1. Paroxysmal Atrial Tachycardia 2. Cardiomyopathy with Biventricular failure  Subjective:    Tired. No complaints of chest pain. Breathing stable. Frustrated with nasal packing.  Objective:   Temp:  [97 F (36.1 C)-98.9 F (37.2 C)] 97.2 F (36.2 C) (07/21 0400) Pulse Rate:  [30-137] 49 (07/21 0600) Resp:  [13-27] 16 (07/21 0600) BP: (89-120)/(54-90) 116/64 mmHg (07/21 0600) SpO2:  [74 %-100 %] 98 % (07/21 0800) Weight:  [137 lb 9.1 oz (62.4 kg)] 137 lb 9.1 oz (62.4 kg) (07/21 0500) Last BM Date: 07/12/16  Filed Weights   07/12/16 0400 07/13/16 0500 07/14/16 0500  Weight: 139 lb 15.9 oz (63.5 kg) 135 lb 9.3 oz (61.5 kg) 137 lb 9.1 oz (62.4 kg)    Intake/Output Summary (Last 24 hours) at 07/14/16 0808 Last data filed at 07/14/16 0400  Gross per 24 hour  Intake    360 ml  Output   1875 ml  Net  -1515 ml    Telemetry: Sinus rhythm with bursts of brief atrial rhythm and PACs.  Exam:  General: No acute distress. Frail appearing.  HEENT: Nasal packing to right nares.   Lungs: Clear to auscultation, nonlabored. Wearing O2 via [place]  Cardiac: No elevated JVP or bruits. RRR, soft systolic murmur,  no gallop or rub.   Abdomen: Normoactive bowel sounds, nontender, nondistended.  Extremities: No pitting edema, distal pulses full.  Neuropsychiatric: Alert and oriented x3, affect appropriate.   Lab Results:  Basic Metabolic Panel:  Recent Labs Lab 07/12/16 0430 07/13/16 0411 07/14/16 0453  NA 133* 135 137  K 4.1 3.7 3.9  CL 92* 92* 95*  CO2 32 33* 33*  GLUCOSE 97 92 93  BUN 23* 23* 21*  CREATININE 0.93 0.90 0.92  CALCIUM 8.5* 8.5* 8.8*    CBC:  Recent Labs Lab 07/08/16 0441 07/11/16 0403 07/14/16 0453  WBC 9.7 8.2 9.9  HGB 13.0 13.0 14.0  HCT 40.3 40.7 42.9  MCV 95.3 94.7 93.3  PLT 225 261 264     Medications:   Scheduled Medications: . amiodarone  200 mg Oral BID   . antiseptic oral rinse  7 mL Mouth Rinse BID  . apixaban  5 mg Oral BID  . atorvastatin  80 mg Oral q1800  . carvedilol  3.125 mg Oral BID WC  . cephALEXin  500 mg Oral Q8H  . feeding supplement (ENSURE ENLIVE)  237 mL Oral BID BM  . furosemide  40 mg Intravenous Daily  . losartan  12.5 mg Oral Daily  . milk and molasses  1 enema Rectal Once  . polyethylene glycol  17 g Oral Daily  . polyvinyl alcohol  1 drop Both Eyes BID  . sodium chloride flush  3 mL Intravenous Q12H  . spironolactone  12.5 mg Oral Daily    PRN Medications: acetaminophen **OR** acetaminophen, ALPRAZolam, magnesium hydroxide, metoprolol, ondansetron (ZOFRAN) IV, polyvinyl alcohol, sodium chloride   Assessment and Plan:   1. Paroxysmal atrial tachycardia: Largely maintaining sinus rhythm at this point. Now on amiodarone 200 mg BID since July 12.   2. Newly diagnosed cardiomyopathy with biventricular failure:  LVEF of 20% per recent echo with severe diffuse hypokinesis with akinesis of the inferior and inferolateral myocardium and restrictive diastolic filling pattern. She has diuresed 6.722 since admission with 1.8 liters over last 24 hours, on lasix 40 mg IV BID, spironolactone 12.6 mg daily. Creatinine 0.92 this am. Continue coreg, and  losartan. BP is stable. Conservative management planned without aggressive invasive testing or inotropic therapy. Palliative care team is involved.  3. Elevated troponin: Demand ischemia.  4. PE: Continues on Eliquis per orimary team. Hgb 14.0/Hct 42.9.   5. Epistaxis:  Nasal packing remains to right nares. Followup with ENT anticipated.  Phill Myron. Lawrence NP [place]  07/14/2016, 8:08 AM   Attending note:  Patient seen and examined. Modified above note by Ms. Lawrence NP. No major change in overall cardiac status. She is up in a chair this morning, complains of being generally fatigued, also frustrated with nasal packing. On examination systolic blood pressure [id number] range,  heart rate in the 50s to 60s, largely maintaining sinus rhythm by telemetry with brief bursts of atrial rhythm and PACs. Lungs are clear with decreased breath sounds at the bases, cardiac exam with indistinct PMI and RRR with apical systolic murmur. Lab work shows creatinine 0.9, hemoglobin 14.0. Amiodarone was reduced to 200 mg twice daily on July 12. Would plan to decrease amiodarone to 200 mg once daily after total of 5 days at current dose. Otherwise continue Coreg, Cozaar, and Aldactone. Last chest x-ray on July 18 showed moderate bilateral pleural effusions, we continue IV Lasix and consider a follow-up chest film. Overall plan is to continue conservative management from a cardiac perspective without aggressive invasive workup. Palliative care team is also involved.  Satira Sark, M.D., F.A.C.C.

## 2016-07-14 NOTE — Clinical Documentation Improvement (Signed)
Cardiology Internal Medicine  Abnormal Lab/Test Results:    Echo 07/04/16  ........"restrictive diastolic filling pattern. MAC with calcified mitral  leaflets and mild mitral regurgitation. Mild left atrial  enlargement. Moderately calcified and thickened aortic valve with  &reduced cusp excursion and mild aortic regurgitation. Suspect  pseudo-stenosis in the setting of &reduced LVEF. Moderately to  severely &reduced RV contraction. Trivial tricuspid regurgitation  with PASP estimated 21 mmHg. Left pleural effusion noted.      Possible Clinical Conditions associated with below indicators, please document all conditions that apply.  Thank you    Nonrheumatic mitral  (valve) insufficiency  Nonrheumatic aortic (valve) insufficiency  Tricuspid Regurgitation  Other Condition  Cannot Clinically Determine   Supporting Information: Newly diagnosed cardiomyopathy with biventricular failure LVEF [id number], acute systolic congested heart failure, Paroxysmal Atrial Fibrillation, demand ischemia    Treatment Provided:  Pt placed on amiodarone dose decreased to 200 mg bid    Please exercise your independent, professional judgment when responding. A specific answer is not anticipated or expected.   Thank You,  [place] [phone]

## 2016-07-14 NOTE — Progress Notes (Signed)
PROGRESS NOTE    Miranda Ford  [id number] DOB: 1924/09/07 DOA: 07/03/2016 PCP: Purvis Kilts, MD   Outpatient Specialists:   Brief Narrative:  81 yof presented with SOB found to be secondary to acute PE. On admission she was started on anticoagulation. Hospital course complicated by development of significant epistaxis requiring anterior nasal packing. Bleeding has stopped and she has been restarted on anticoagulation. She was also noted to have atrial tachycardia started on amiodarone/coreg with some improvement. Cardiology is following. ECHO with EF of 20%. She has evidence of volume overload, but diuresing is proving challenging with concurrent hypotension. Will request palliative care consult to discuss goals of care.  Assessment & Plan: Active Problems:   Acute pulmonary embolism (HCC)   Tachycardia   Pulmonary embolism (HCC)   Cardiomegaly   Coronary atherosclerosis of native coronary artery   Paroxysmal atrial tachycardia (HCC)   Demand ischemia (HCC)   Acute systolic CHF (congestive heart failure) (HCC)   Acute respiratory failure with hypoxia (HCC)   Secondary cardiomyopathy (HCC)   Biventricular failure (HCC)   Atrial tachycardia (HCC)   Acute congestive heart failure (HCC)   Pulmonary embolism without acute cor pulmonale (HCC)   Epistaxis   Palliative care encounter   Goals of care, counseling/discussion   DNR (do not resuscitate) discussion   1. Acute PE with evidence of right heart strain. Initially started on IV Heparin but had to be discontinued due to significant epistaxis. Nasal packing currently in place. Ultrasound of LE negative for DVT bilaterally. She has not had further evidence of bleeding. Continue Eliquis for now. Appreciate input by palliative care. Patient is DO NOT RESUSCITATE and is agreeable to a natural death when the time comes. Still requires ample amounts of O2. Weaning O2 2. Acute epistaxis of right nostril. Resolved. 7/12 patient with  significant epistaxis from her right nostril that was noted to soak through multiple gauzes and despite applying pressure continued to bleed. Patient was transported to ED by request of Dr. Lacinda Axon for placement of rhinorocket. Bleeding has resolved and Hgb remains stable. Dr. Roderic Palau had discussed case with Dr. Redmond Baseman (ENT) in [place] who said that nasal packing can be left in place for 3-5 days. He recommended to start the patient on keflex while packing in place. Patient to follow-up as outpatient after discharge to remove packing. Per staff, continued small oozing still 3. Acute respiratory failure with hypoxia. Contiue to wean oxygen as tolerated.Still on high flow O2. Wean as tolerated 4. Elevated troponins: Likely demand ishemia. No complaints of CP.  5. Paroxysmal atrial tachycardia: Improvement with diuresis. Cardiology following and ECHO results as below. Continue amiodarone and coreg. 6. Acute systolic CHF: EF of [id number]. Bilateral pleural effusions noted by chest imaging including CT. Cardiology input appreciated. Continue Coreg. She is on aldactone and low dose losartan. Lasix changed to once a day. Appreciate cardiology input.  7. Coronary atherosclerosis of native coronary artery. No complaints of chest pain. ECHO does show wall motion abnormalities and depressed EF. Further management per Cardiology. Stable   DVT prophylaxis: eliquis  Code Status:  DNR Family Communication: no family present  Disposition Plan: Discharged once improved    Consultants:   Cardiology   ENT, Redmond Baseman (phone per Dr. Roderic Palau)  Procedures:  ECHO  - Left ventricle: The cavity size was mildly dilated. Wall  thickness was increased in a pattern of mild LVH. The estimated  ejection fraction was 20%. Diffuse hypokinesis. There is akinesis  of the lateral, inferolateral,  and inferior myocardium. Doppler  parameters are consistent with restrictive physiology, indicative  of decreased left ventricular diastolic  compliance and/or  increased left atrial pressure. - Aortic valve: Mildly calcified annulus. Trileaflet; moderately  thickened, moderately calcified leaflets. There was mild  regurgitation. Mean gradient (S): 5 mm Hg. VTI ratio of LVOT to  aortic valve: 0.35. Valve area (VTI): 1.22 cm^2. Valve area  (Vmax): 1.43 cm^2. Suspect pseudo-stenosis in the setting of  significantly reduced LVEF. - Mitral valve: Calcified annulus. Mildly calcified leaflets .  There was mild regurgitation. - Left atrium: The atrium was mildly dilated. - Right ventricle: Systolic function was moderately to severely  reduced. - Right atrium: Central venous pressure (est): 3 mm Hg. - Atrial septum: No defect or patent foramen ovale was identified. - Tricuspid valve: There was trivial regurgitation. - Pulmonary arteries: PA peak pressure: 20 mm Hg (S). - Pericardium, extracardiac: There was a left pleural effusion.  Rhino rocket right nostril 7/12  Antimicrobials:   Keflex 7/14>>  Subjective: No complaints at this time  Objective: Filed Vitals:   07/14/16 0900 07/14/16 1300 07/14/16 1600 07/14/16 1654  BP: 114/54   102/73  Pulse: 69   73  Temp:  97.6 F (36.4 C) 97.4 F (36.3 C)   TempSrc:  Axillary Axillary   Resp: 17     Height:      Weight:      SpO2: 95%       Intake/Output Summary (Last 24 hours) at 07/14/16 1822 Last data filed at 07/14/16 1700  Gross per 24 hour  Intake    240 ml  Output   1250 ml  Net  -1010 ml   Filed Weights   07/12/16 0400 07/13/16 0500 07/14/16 0500  Weight: 63.5 kg (139 lb 15.9 oz) 61.5 kg (135 lb 9.3 oz) 62.4 kg (137 lb 9.1 oz)    Examination:  General exam: Appears calm and comfortable, nasal packing in right nostril, sitting in chair Respiratory system: diminished breath sounds at bases. Poor inspiratory effort. Cardiovascular system: S1 & S2 heard, RRR. Gastrointestinal system: Abdomen is nondistended, soft and nontender. No organomegaly or masses  felt. Normal bowel sounds heard. Central nervous system: Alert and oriented. No focal neurological deficits. Extremities: Symmetric 5 x 5 power. Skin: No rashes, lesions Psychiatry: Judgement and insight appear normal. Mood & affect appropriate.   Data Reviewed: I have personally reviewed following labs and imaging studies  CBC:  Recent Labs Lab 07/08/16 0441 07/11/16 0403 07/14/16 0453  WBC 9.7 8.2 9.9  HGB 13.0 13.0 14.0  HCT 40.3 40.7 42.9  MCV 95.3 94.7 93.3  PLT 225 261 [id number]   Basic Metabolic Panel:  Recent Labs Lab 07/08/16 0441 07/10/16 0429 07/12/16 0430 07/13/16 0411 07/14/16 0453  NA 136 132* 133* 135 137  K 5.0 4.7 4.1 3.7 3.9  CL 98* 92* 92* 92* 95*  CO2 30 31 32 33* 33*  GLUCOSE 117* 104* 97 92 93  BUN 22* 29* 23* 23* 21*  CREATININE 0.99 0.98 0.93 0.90 0.92  CALCIUM 8.6* 8.4* 8.5* 8.5* 8.8*   GFR: Estimated Creatinine Clearance: 35.1 mL/min (by C-G formula based on Cr of 0.92). Liver Function Tests: No results for input(s): AST, ALT, ALKPHOS, BILITOT, PROT, ALBUMIN in the last 168 hours. Coagulation Profile: No results for input(s): INR, PROTIME in the last 168 hours. Cardiac Enzymes: No results for input(s): CKTOTAL, CKMB, CKMBINDEX, TROPONINI in the last 168 hours. Urine analysis:    Component Value Date/Time  COLORURINE YELLOW 07/03/2016 [place] 07/03/2016 1557   LABSPEC 1.010 07/03/2016 1557   PHURINE 5.5 07/03/2016 1557   GLUCOSEU NEGATIVE 07/03/2016 1557   HGBUR NEGATIVE 07/03/2016 [employer] 07/03/2016 1557   [place] 07/03/2016 1557   PROTEINUR NEGATIVE 07/03/2016 1557   NITRITE NEGATIVE 07/03/2016 1557   LEUKOCYTESUR NEGATIVE 07/03/2016 1557   Sepsis Labs: @LABRCNTIP (procalcitonin:4,lacticidven:4)  ) No results found for this or any previous visit (from the past 240 hour(s)).  Radiology Studies: No results found. Scheduled Meds: . amiodarone  200 mg Oral BID  . antiseptic oral  rinse  7 mL Mouth Rinse BID  . apixaban  5 mg Oral BID  . atorvastatin  80 mg Oral q1800  . carvedilol  3.125 mg Oral BID WC  . cephALEXin  500 mg Oral Q8H  . feeding supplement (ENSURE ENLIVE)  237 mL Oral BID BM  . furosemide  40 mg Intravenous Daily  . losartan  12.5 mg Oral Daily  . milk and molasses  1 enema Rectal Once  . polyethylene glycol  17 g Oral Daily  . polyvinyl alcohol  1 drop Both Eyes BID  . sodium chloride flush  3 mL Intravenous Q12H  . spironolactone  12.5 mg Oral Daily   Continuous Infusions:     LOS: 11 days     Marylu Lund, MD [facility] If 7PM-7AM, please contact night-coverage [email or web address] Password [facility] 07/14/2016, 6:22 PM

## 2016-07-14 NOTE — Clinical Social Work Placement (Signed)
   CLINICAL SOCIAL WORK PLACEMENT  NOTE  Date:  07/14/2016  Patient Details  Name: Miranda Ford MRN: [id number] Date of Birth: May 20, 1924  Clinical Social Work is seeking post-discharge placement for this patient at the [place] level of care (*CSW will initial, date and re-position this form in  chart as items are completed):  Yes   Patient/family provided with [place] Work Department's list of facilities offering this level of care within the geographic area requested by the patient (or if unable, by the patient's family).  Yes   Patient/family informed of their freedom to choose among providers that offer the needed level of care, that participate in Medicare, Medicaid or managed care program needed by the patient, have an available bed and are willing to accept the patient.  Yes   Patient/family informed of [place]'s ownership interest in [facility] and [facility], as well as of the fact that they are under no obligation to receive care at these facilities.  PASRR submitted to EDS on 07/14/16     PASRR number received on 07/14/16     Existing PASRR number confirmed on       FL2 transmitted to all facilities in geographic area requested by pt/family on 07/14/16     FL2 transmitted to all facilities within larger geographic area on       Patient informed that his/her managed care company has contracts with or will negotiate with certain facilities, including the following:            Patient/family informed of bed offers received.  Patient chooses bed at [facility]     Physician recommends and patient chooses bed at      Patient to be transferred to   on  .  Patient to be transferred to facility by       Patient family notified on   of transfer.  Name of family member notified:        PHYSICIAN       Additional Comment:    _______________________________________________ Ihor Gully,  LCSW 07/14/2016, 3:16 PM

## 2016-07-15 ENCOUNTER — Inpatient Hospital Stay
Admission: RE | Admit: 2016-07-15 | Discharge: 2016-08-03 | Disposition: A | Discharge status: Home / Self Care | Payer: Medicare Other | Source: Ambulatory Visit | Attending: Internal Medicine | Admitting: Internal Medicine

## 2016-07-15 DIAGNOSIS — I248 Other forms of acute ischemic heart disease: Secondary | ICD-10-CM | POA: Diagnosis not present

## 2016-07-15 DIAGNOSIS — J9601 Acute respiratory failure with hypoxia: Secondary | ICD-10-CM | POA: Diagnosis not present

## 2016-07-15 DIAGNOSIS — M6281 Muscle weakness (generalized): Secondary | ICD-10-CM | POA: Diagnosis not present

## 2016-07-15 DIAGNOSIS — R279 Unspecified lack of coordination: Secondary | ICD-10-CM | POA: Diagnosis not present

## 2016-07-15 DIAGNOSIS — I517 Cardiomegaly: Secondary | ICD-10-CM | POA: Diagnosis not present

## 2016-07-15 DIAGNOSIS — I5021 Acute systolic (congestive) heart failure: Secondary | ICD-10-CM | POA: Diagnosis not present

## 2016-07-15 DIAGNOSIS — R262 Difficulty in walking, not elsewhere classified: Secondary | ICD-10-CM | POA: Diagnosis not present

## 2016-07-15 DIAGNOSIS — J9691 Respiratory failure, unspecified with hypoxia: Secondary | ICD-10-CM | POA: Diagnosis not present

## 2016-07-15 DIAGNOSIS — M810 Age-related osteoporosis without current pathological fracture: Secondary | ICD-10-CM | POA: Diagnosis not present

## 2016-07-15 DIAGNOSIS — I471 Supraventricular tachycardia: Secondary | ICD-10-CM | POA: Diagnosis not present

## 2016-07-15 DIAGNOSIS — H3530 Unspecified macular degeneration: Secondary | ICD-10-CM | POA: Diagnosis not present

## 2016-07-15 DIAGNOSIS — Z515 Encounter for palliative care: Secondary | ICD-10-CM | POA: Diagnosis not present

## 2016-07-15 DIAGNOSIS — I2692 Saddle embolus of pulmonary artery without acute cor pulmonale: Secondary | ICD-10-CM | POA: Diagnosis not present

## 2016-07-15 DIAGNOSIS — I2699 Other pulmonary embolism without acute cor pulmonale: Secondary | ICD-10-CM | POA: Diagnosis not present

## 2016-07-15 DIAGNOSIS — I509 Heart failure, unspecified: Secondary | ICD-10-CM | POA: Diagnosis not present

## 2016-07-15 DIAGNOSIS — R040 Epistaxis: Secondary | ICD-10-CM | POA: Diagnosis not present

## 2016-07-15 DIAGNOSIS — Z86711 Personal history of pulmonary embolism: Secondary | ICD-10-CM | POA: Diagnosis not present

## 2016-07-15 DIAGNOSIS — I5022 Chronic systolic (congestive) heart failure: Secondary | ICD-10-CM | POA: Diagnosis not present

## 2016-07-15 DIAGNOSIS — I2510 Atherosclerotic heart disease of native coronary artery without angina pectoris: Secondary | ICD-10-CM | POA: Diagnosis not present

## 2016-07-15 DIAGNOSIS — J449 Chronic obstructive pulmonary disease, unspecified: Secondary | ICD-10-CM | POA: Diagnosis not present

## 2016-07-15 MED ORDER — AMIODARONE HCL 200 MG PO TABS: 200.0000 mg | ORAL_TABLET | Freq: Every day | ORAL | Status: DC

## 2016-07-15 MED ORDER — ATORVASTATIN CALCIUM 80 MG PO TABS: 80.0000 mg | ORAL_TABLET | Freq: Every day | ORAL | Status: DC

## 2016-07-15 MED ORDER — CARVEDILOL 3.125 MG PO TABS: 3.1250 mg | ORAL_TABLET | Freq: Two times a day (BID) | ORAL | Status: DC

## 2016-07-15 MED ORDER — APIXABAN 5 MG PO TABS: 5.0000 mg | ORAL_TABLET | Freq: Two times a day (BID) | ORAL | Status: DC

## 2016-07-15 MED ORDER — SPIRONOLACTONE 25 MG PO TABS: 12.5000 mg | ORAL_TABLET | Freq: Every day | ORAL | Status: DC

## 2016-07-15 MED ORDER — LOSARTAN POTASSIUM 25 MG PO TABS: 12.5000 mg | ORAL_TABLET | Freq: Every day | ORAL | Status: DC

## 2016-07-15 MED ORDER — FUROSEMIDE 40 MG PO TABS: 40.0000 mg | ORAL_TABLET | Freq: Every day | ORAL | Status: DC

## 2016-07-15 NOTE — Progress Notes (Signed)
Alert and oriented. Vital signs stable. Saline lock removed. Telemetry D/C'ed. Left floor via wheelchair with nursing staff and family member.  Discharged to [facility] rm 115.  Importance of getting an ENT appointment ASAP with Dr. Redmond Baseman or equivalent conveyed in report.  Noreene Larsson 07/15/2016 5:48 PM

## 2016-07-15 NOTE — Progress Notes (Addendum)
Pt to dc to [facility] nursing center. Report number given to RN. Going to room 115.  CSW signing off.  Percell Locus [place] LCSWA [phone]

## 2016-07-15 NOTE — Discharge Summary (Signed)
Physician Discharge Summary  Miranda Ford [id number] DOB: 01/22/1924 DOA: 07/03/2016  PCP: Purvis Kilts, MD  Admit date: 07/03/2016 Discharge date: 07/15/2016  Disposition:  [facility]  Recommendations for Outpatient Follow-up:  1. Follow up with PCP in 1-2 weeks 2. Please obtain BMP/CBC in one week 3. Please follow up on the following pending results: 4. Please follow up with Dr. Redmond Baseman in one week  Discharge Condition:Stable CODE STATUS:Full Diet recommendation: Heart Healthy  Brief/Interim Summary: 56 yof presented with SOB found to be secondary to acute PE. On admission she was started on anticoagulation. Hospital course complicated by development of significant epistaxis requiring anterior nasal packing. Bleeding has stopped and she has been restarted on anticoagulation. She was also noted to have atrial tachycardia started on amiodarone/coreg with some improvement. Cardiology is following. ECHO with EF of 20%. She has evidence of volume overload, but diuresing is proving challenging with concurrent hypotension. Will request palliative care consult to discuss goals of care.  1. Acute PE with evidence of right heart strain. Initially started on IV Heparin but had to be discontinued due to significant epistaxis. Nasal packing currently in place. Ultrasound of LE negative for DVT bilaterally. She has not had further evidence of bleeding. Continue Eliquis for now. Appreciate input by palliative care. Patient is DO NOT RESUSCITATE and is agreeable to a natural death when the time comes. Pt had required ample amounts of supplemental O2, since weaned to 2.5L 2. Acute epistaxis of right nostril. Resolved. 7/12 patient with significant epistaxis from her right nostril that was noted to soak through multiple gauzes and despite applying pressure continued to bleed. Patient was transported to ED by request of Dr. Lacinda Axon for placement of rhinorocket. Bleeding has resolved and Hgb remains  stable. Patient has been continued with rhinorocket. Patient to follow-up as outpatient after discharge to remove packing. On 7/21, staff had reported small ooze of blood from rhinorocket 3. Acute respiratory failure with hypoxia. Weaned to 2.5LNC 4. Elevated troponins: Likely demand ishemia. No complaints of CP.  5. Paroxysmal atrial tachycardia: Improvement with diuresis. Cardiology following and ECHO results as below. Continued amiodarone and coreg. 6. Acute systolic CHF: EF of [id number]. Bilateral pleural effusions noted by chest imaging including CT. Cardiology input appreciated. Continue Coreg. She is on aldactone and low dose losartan. Lasix was changed to once a day. Appreciate cardiology input. Will continue on daily lasix on discharge 7. Coronary atherosclerosis of native coronary artery. No complaints of chest pain. ECHO does show wall motion abnormalities and depressed EF. Further management per Cardiology. Remained stable  Discharge Diagnoses:  Active Problems:   Acute pulmonary embolism (HCC)   Tachycardia   Pulmonary embolism (HCC)   Cardiomegaly   Coronary atherosclerosis of native coronary artery   Paroxysmal atrial tachycardia (HCC)   Demand ischemia (HCC)   Acute systolic CHF (congestive heart failure) (HCC)   Acute respiratory failure with hypoxia (HCC)   Secondary cardiomyopathy (HCC)   Biventricular failure (HCC)   Atrial tachycardia (HCC)   Acute congestive heart failure (HCC)   Pulmonary embolism without acute cor pulmonale (HCC)   Epistaxis   Palliative care encounter   Goals of care, counseling/discussion   DNR (do not resuscitate) discussion    Discharge Instructions     Medication List    TAKE these medications        alendronate 70 MG tablet  Commonly known as:  FOSAMAX  Take 70 mg by mouth once a week. Take on Saturday's. Take with  a full glass of water on an empty stomach.     amiodarone 200 MG tablet  Commonly known as:  PACERONE  Take 1 tablet  (200 mg total) by mouth daily.  Start taking on:  07/16/2016     apixaban 5 MG Tabs tablet  Commonly known as:  ELIQUIS  Take 1 tablet (5 mg total) by mouth 2 (two) times daily.     atorvastatin 80 MG tablet  Commonly known as:  LIPITOR  Take 1 tablet (80 mg total) by mouth daily at 6 PM.     calcium carbonate 1500 (600 Ca) MG Tabs tablet  Commonly known as:  OSCAL  Take 1,500 mg by mouth 2 (two) times daily with a meal.     carvedilol 3.125 MG tablet  Commonly known as:  COREG  Take 1 tablet (3.125 mg total) by mouth 2 (two) times daily with a meal.     furosemide 40 MG tablet  Commonly known as:  LASIX  Take 1 tablet (40 mg total) by mouth daily.     ICAPS PO  Take 1 capsule by mouth 2 (two) times daily.     losartan 25 MG tablet  Commonly known as:  COZAAR  Take 0.5 tablets (12.5 mg total) by mouth daily.     spironolactone 25 MG tablet  Commonly known as:  ALDACTONE  Take 0.5 tablets (12.5 mg total) by mouth daily.       Follow-up Information    Follow up with [facility] .   Specialty:  Skilled Nursing Facility   Contact information:   [address] [place] [phone]      Follow up with Redmond Baseman, DWIGHT, MD In 1 week.   Specialty:  Otolaryngology   Why:  Hospital follow up - to remove rhinorocket   Contact information:   [address] [place] [address] [phone]      Allergies  Allergen Reactions  . Codeine Nausea Only  . Penicillins Other (See Comments)    "Not allergic just a lot of my family members are allergic". Has patient had a PCN reaction causing immediate rash, facial/tongue/throat swelling, SOB or lightheadedness with hypotension: No Has patient had a PCN reaction causing severe rash involving mucus membranes or skin necrosis: No Has patient had a PCN reaction that required hospitalization No Has patient had a PCN reaction occurring within the last 10 years: No If all of  the above answers are "NO", then may proceed with Cephalosporin use.     Consultations:  Cardiology  Palliative Care   Procedures/Studies: Dg Chest 2 View  07/03/2016  CLINICAL DATA:  Shortness of breath for 3 days.  Former smoker. EXAM: CHEST  2 VIEW COMPARISON:  Chest x-ray dated 02/23/2004 FINDINGS: There is cardiomegaly. Atherosclerotic changes noted at the aortic arch. Lungs are hyperexpanded. Coarse interstitial markings are seen bilaterally, most likely chronic interstitial lung disease. More confluent opacities are seen at each lung base, right greater than left, of uncertain acuity. Suspect small pleural effusions. Mild degenerative spurring noted within the kyphotic thoracic spine. No acute or suspicious osseous finding. IMPRESSION: 1. Lungs are hyperexpanded suggesting COPD. Probable associated chronic interstitial lung disease. 2. Confluent opacities at each lung base. These could represent acute pneumonia or chronic confluent scarring/fibrosis. There are new compared to a previous chest x-ray dated 02/23/2004 but there are no recent prior studies available to assess chronicity. 3. Probable small pleural effusions bilaterally. 4. Aortic atherosclerosis.  Electronically Signed   By: Franki Cabot M.D.   On: 07/03/2016 12:16   Ct Angio Chest Pe W/cm &/or Wo Cm  07/03/2016  CLINICAL DATA:  Shortness of breath for 3 days. EXAM: CT ANGIOGRAPHY CHEST WITH CONTRAST TECHNIQUE: Multidetector CT imaging of the chest was performed using the standard protocol during bolus administration of intravenous contrast. Multiplanar CT image reconstructions and MIPs were obtained to evaluate the vascular anatomy. CONTRAST:  100 cc Isovue 370 COMPARISON:  Chest x-ray from earlier same day. FINDINGS: Mediastinum/Lymph Nodes: Some of the most peripheral segmental and subsegmental pulmonary artery branches to the lower lobes are difficult to definitively characterize due to patient breathing motion artifact but  there is a single acute-appearing pulmonary embolus identified at the junction of the segmental pulmonary artery branches to the posterior and lateral segments of the right lower lobe. No other pulmonary emboli identified, with limitations detailed above. Scattered atherosclerotic changes seen along the walls of the normal-caliber thoracic aorta. No aneurysm or dissection seen. Heart is enlarged. No pericardial effusion. Coronary artery calcifications noted. No mass or enlarged lymph nodes seen within the mediastinum or perihilar regions. Trachea and central bronchi are unremarkable. Lungs/Pleura: Bilateral moderate-sized pleural effusions, right slightly greater than left. Associated interstitial edema at the lung bases. Scarring/fibrosis and emphysematous changes noted within the upper lobes. No evidence of pneumonia seen. No pneumothorax. Upper abdomen: Limited images of the upper abdomen are unremarkable. Musculoskeletal: Mild degenerative spurring noted within the slightly scoliotic thoracic spine. Mild compression deformity of an upper thoracic vertebral body appears chronic. No acute or suspicious osseous finding. Superficial soft tissues are unremarkable. Review of the MIP images confirms the above findings. IMPRESSION: 1. Single acute-appearing pulmonary embolus identified at the junction of the segmental pulmonary artery branches to the posterior and lateral segments of the right lower lobe. No other pulmonary emboli identified, with mild study limitations detailed above. 2. Bilateral moderate-sized pleural effusions, right slightly greater than left. Associated interstitial edema and atelectasis at the right lung base. No evidence of pneumonia seen. 3. Scarring/fibrosis and emphysematous changes within the upper lobes bilaterally, mild to moderate in degree. 4. Aortic atherosclerosis. 5. Cardiomegaly. Coronary artery calcifications, particularly dense within the left anterior descending and circumflex  coronary arteries. Recommend correlation with any possible associated cardiac symptoms. These results were called by telephone at the time of interpretation on 07/03/2016 at 3:03 pm to Dr. Varney Biles , who verbally acknowledged these results. Electronically Signed   By: Franki Cabot M.D.   On: 07/03/2016 15:04   US Venous Img Lower Bilateral  07/06/2016  CLINICAL DATA:  Pulmonary embolism. History of prior pulmonary embolism. Evaluate for DVT. EXAM: BILATERAL LOWER EXTREMITY VENOUS DOPPLER ULTRASOUND TECHNIQUE: Gray-scale sonography with graded compression, as well as color Doppler and duplex ultrasound were performed to evaluate the lower extremity deep venous systems from the level of the common femoral vein and including the common femoral, femoral, profunda femoral, popliteal and calf veins including the posterior tibial, peroneal and gastrocnemius veins when visible. The superficial great saphenous vein was also interrogated. Spectral Doppler was utilized to evaluate flow at rest and with distal augmentation maneuvers in the common femoral, femoral and popliteal veins. COMPARISON:  None. FINDINGS: RIGHT LOWER EXTREMITY Common Femoral Vein: No evidence of thrombus. Normal compressibility, respiratory phasicity and response to augmentation. Saphenofemoral Junction: No evidence of thrombus. Normal compressibility and flow on color Doppler imaging. Profunda Femoral Vein: No evidence of thrombus. Normal compressibility and flow on color Doppler imaging. Femoral Vein:  No evidence of thrombus. Normal compressibility, respiratory phasicity and response to augmentation. Popliteal Vein: No evidence of thrombus. Normal compressibility, respiratory phasicity and response to augmentation. Note is made of mild fusiform ectasia involving the mid aspect of the right popliteal vein (representative images 20 and 30). Calf Veins: No evidence of thrombus. Normal compressibility and flow on color Doppler imaging. Superficial  Great Saphenous Vein: No evidence of thrombus. Normal compressibility and flow on color Doppler imaging. Venous Reflux:  None. Other Findings:  None. LEFT LOWER EXTREMITY Common Femoral Vein: No evidence of thrombus. Normal compressibility, respiratory phasicity and response to augmentation. Saphenofemoral Junction: No evidence of thrombus. Normal compressibility and flow on color Doppler imaging. Profunda Femoral Vein: No evidence of thrombus. Normal compressibility and flow on color Doppler imaging. Femoral Vein: No evidence of thrombus. Normal compressibility, respiratory phasicity and response to augmentation. Popliteal Vein: No evidence of thrombus. Normal compressibility, respiratory phasicity and response to augmentation. Calf Veins: No evidence of thrombus. Normal compressibility and flow on color Doppler imaging. Superficial Great Saphenous Vein: No evidence of thrombus. Normal compressibility and flow on color Doppler imaging. Venous Reflux:  None. Other Findings:  None. IMPRESSION: No evidence of acute or chronic DVT within either lower extremity. Electronically Signed   By: Sandi Mariscal M.D.   On: 07/06/2016 14:37   Dg Chest Port 1 View  07/11/2016  CLINICAL DATA:  Patient with history of respiratory failure. EXAM: PORTABLE CHEST 1 VIEW COMPARISON:  Chest CT 07/03/2016; chest radiograph 07/03/2016. FINDINGS: Monitoring leads overlie the patient. Stable cardiomegaly. Tortuosity and calcification of the thoracic aorta. Moderate bilateral pleural effusions with underlying pulmonary consolidation. Scattered heterogeneous opacities throughout the lungs bilaterally. No pneumothorax. IMPRESSION: Moderate bilateral pleural effusions with underlying pulmonary consolidation most compatible with atelectasis. Additionally there are heterogeneous opacities involving the upper lungs bilaterally which may represent associated pulmonary edema and or chronic interstitial process. Electronically Signed   By: Lovey Newcomer  M.D.   On: 07/11/2016 10:30    Subjective: No complaints today. Eager to start therapy  Discharge Exam: Filed Vitals:   07/15/16 1000 07/15/16 1200  BP: 98/57   Pulse: 61   Temp:  97.7 F (36.5 C)  Resp: 19    Filed Vitals:   07/15/16 0900 07/15/16 0957 07/15/16 1000 07/15/16 1200  BP: 110/55 110/55 98/57   Pulse: 63 47 61   Temp:    97.7 F (36.5 C)  TempSrc:    Axillary  Resp: 19  19   Height:      Weight:      SpO2: 93%  94%     General: Pt is alert, awake, not in acute distress Cardiovascular: RRR, S1/S2 +, no rubs, no gallops Respiratory: CTA bilaterally, no wheezing, no rhonchi Abdominal: Soft, NT, ND, bowel sounds + Extremities: no edema, no cyanosis    The results of significant diagnostics from this hospitalization (including imaging, microbiology, ancillary and laboratory) are listed below for reference.     Microbiology: No results found for this or any previous visit (from the past 240 hour(s)).   Labs: BNP (last 3 results)  Recent Labs  07/03/16 1145  BNP [id number]*   Basic Metabolic Panel:  Recent Labs Lab 07/10/16 0429 07/12/16 0430 07/13/16 0411 07/14/16 0453  NA 132* 133* 135 137  K 4.7 4.1 3.7 3.9  CL 92* 92* 92* 95*  CO2 31 32 33* 33*  GLUCOSE 104* 97 92 93  BUN 29* 23* 23* 21*  CREATININE 0.98 0.93 0.90 0.92  CALCIUM 8.4* 8.5* 8.5* 8.8*   Liver Function Tests: No results for input(s): AST, ALT, ALKPHOS, BILITOT, PROT, ALBUMIN in the last 168 hours. No results for input(s): LIPASE, AMYLASE in the last 168 hours. No results for input(s): AMMONIA in the last 168 hours. CBC:  Recent Labs Lab 07/11/16 0403 07/14/16 0453  WBC 8.2 9.9  HGB 13.0 14.0  HCT 40.7 42.9  MCV 94.7 93.3  PLT 261 264   Cardiac Enzymes: No results for input(s): CKTOTAL, CKMB, CKMBINDEX, TROPONINI in the last 168 hours. BNP: Invalid input(s): POCBNP CBG: No results for input(s): GLUCAP in the last 168 hours. D-Dimer No results for input(s):  DDIMER in the last 72 hours. Hgb A1c No results for input(s): HGBA1C in the last 72 hours. Lipid Profile No results for input(s): CHOL, HDL, LDLCALC, TRIG, CHOLHDL, LDLDIRECT in the last 72 hours. Thyroid function studies No results for input(s): TSH, T4TOTAL, T3FREE, THYROIDAB in the last 72 hours.  Invalid input(s): FREET3 Anemia work up No results for input(s): VITAMINB12, FOLATE, FERRITIN, TIBC, IRON, RETICCTPCT in the last 72 hours. Urinalysis    Component Value Date/Time   COLORURINE YELLOW 07/03/2016 1557   APPEARANCEUR CLEAR 07/03/2016 1557   LABSPEC 1.010 07/03/2016 1557   PHURINE 5.5 07/03/2016 1557   GLUCOSEU NEGATIVE 07/03/2016 1557   HGBUR NEGATIVE 07/03/2016 1557   [place] 07/03/2016 1557   KETONESUR NEGATIVE 07/03/2016 1557   PROTEINUR NEGATIVE 07/03/2016 1557   NITRITE NEGATIVE 07/03/2016 1557   LEUKOCYTESUR NEGATIVE 07/03/2016 1557   Sepsis Labs Invalid input(s): PROCALCITONIN,  WBC,  LACTICIDVEN Microbiology No results found for this or any previous visit (from the past 240 hour(s)).    Dilynn Munroe, Orpah Melter, MD  [facility] 07/15/2016, 2:57 PM   If 7PM-7AM, please contact night-coverage [email or web address] Password [email or web address]

## 2016-07-17 ENCOUNTER — Other Ambulatory Visit (HOSPITAL_COMMUNITY)
Admission: RE | Admit: 2016-07-17 | Discharge: 2016-07-17 | Disposition: A | Discharge status: Home / Self Care | Payer: No Typology Code available for payment source | Source: Skilled Nursing Facility | Attending: Internal Medicine | Admitting: Internal Medicine

## 2016-07-17 DIAGNOSIS — I2692 Saddle embolus of pulmonary artery without acute cor pulmonale: Secondary | ICD-10-CM | POA: Insufficient documentation

## 2016-07-17 LAB — CBC WITH DIFFERENTIAL/PLATELET
BASOS ABS: 0 10*3/uL (ref 0.0–0.1)
Basophils Relative: 0 %
EOS ABS: 0.1 10*3/uL (ref 0.0–0.7)
Eosinophils Relative: 2 %
HCT: 40.1 % (ref 36.0–46.0)
HEMOGLOBIN: 13.1 g/dL (ref 12.0–15.0)
LYMPHS ABS: 1 10*3/uL (ref 0.7–4.0)
LYMPHS PCT: 14 %
MCH: 30.5 pg (ref 26.0–34.0)
MCHC: 32.7 g/dL (ref 30.0–36.0)
MCV: 93.5 fL (ref 78.0–100.0)
Monocytes Absolute: 0.9 10*3/uL (ref 0.1–1.0)
Monocytes Relative: 12 %
NEUTROS PCT: 72 %
Neutro Abs: 5.5 10*3/uL (ref 1.7–7.7)
Platelets: 242 10*3/uL (ref 150–400)
RBC: 4.29 MIL/uL (ref 3.87–5.11)
RDW: 13.6 % (ref 11.5–15.5)
WBC: 7.6 10*3/uL (ref 4.0–10.5)

## 2016-07-17 LAB — BASIC METABOLIC PANEL
ANION GAP: 8 (ref 5–15)
BUN: 23 mg/dL — AB (ref 6–20)
CHLORIDE: 97 mmol/L — AB (ref 101–111)
CO2: 32 mmol/L (ref 22–32)
Calcium: 8.7 mg/dL — ABNORMAL LOW (ref 8.9–10.3)
Creatinine, Ser: 1.04 mg/dL — ABNORMAL HIGH (ref 0.44–1.00)
GFR calc Af Amer: 54 mL/min — ABNORMAL LOW (ref >60)
GFR, EST NON AFRICAN AMERICAN: 46 mL/min — AB (ref >60)
Glucose, Bld: 94 mg/dL (ref 65–99)
POTASSIUM: 3.9 mmol/L (ref 3.5–5.1)
SODIUM: 137 mmol/L (ref 135–145)

## 2016-07-18 ENCOUNTER — Non-Acute Institutional Stay (SKILLED_NURSING_FACILITY): Payer: Medicare Other | Admitting: Internal Medicine

## 2016-07-18 ENCOUNTER — Encounter: Payer: Self-pay | Admitting: Internal Medicine

## 2016-07-18 DIAGNOSIS — M810 Age-related osteoporosis without current pathological fracture: Secondary | ICD-10-CM | POA: Diagnosis not present

## 2016-07-18 DIAGNOSIS — R040 Epistaxis: Secondary | ICD-10-CM | POA: Diagnosis not present

## 2016-07-18 DIAGNOSIS — I5021 Acute systolic (congestive) heart failure: Secondary | ICD-10-CM | POA: Diagnosis not present

## 2016-07-18 DIAGNOSIS — Z515 Encounter for palliative care: Secondary | ICD-10-CM | POA: Diagnosis not present

## 2016-07-18 DIAGNOSIS — I471 Supraventricular tachycardia: Secondary | ICD-10-CM | POA: Diagnosis not present

## 2016-07-18 DIAGNOSIS — I2699 Other pulmonary embolism without acute cor pulmonale: Secondary | ICD-10-CM

## 2016-07-18 NOTE — Assessment & Plan Note (Signed)
DNR

## 2016-07-18 NOTE — Assessment & Plan Note (Signed)
Clinically resolved No change in medications is  indicated

## 2016-07-18 NOTE — Assessment & Plan Note (Signed)
Verify F/U appointment with Dr Johnnette Gourd, to remove Rhinorocket

## 2016-07-18 NOTE — Assessment & Plan Note (Signed)
Rate controlled 

## 2016-07-18 NOTE — Assessment & Plan Note (Signed)
Stable on Eliquis

## 2016-07-18 NOTE — Progress Notes (Signed)
Patient ID: Miranda Ford, female   DOB: 11-11-1924, 82 y.o.   MRN: [id number]    This is a comprehensive admission note to [facility] personally performed by Unice Cobble MD on this date less than 30 days from date of admission. Included are preadmission medical/surgical history;reconciled medication list; family history; social history and comprehensive review of systems.  Corrections and additions to the records were documented . Comprehensive physical exam was also performed. Additionally a clinical summary was entered for each active diagnosis pertinent to this admission in the Problem List to enhance continuity of care.  PCP: Halford Chessman  MD  HPI: The patient was hospitalized 7/10-7/22/17 for treatment of acute pulmonary emboli.With heparin anticoagulation she developed significant epistaxis which required anterior nasal packing. Eliquis was initiated once bleeding was controlled. Amiodarone and Coreg were initiated for atrial tachycardia.Her ejection fraction was severely reduced at 20%. She was diuresed to treat volume overload, this was complicated by recurrent hypotension. Palliative Care consult lead to DO NOT RESUSCITATE designation. Nasal O2 was continued for persistent hypoxemia. Elevated troponins were attributed to demand ischemia; the patient had no chest pain. She was admitted to [facility] for [facility].  She is to have follow follow-up appointment with Dr. Redmond Baseman within 7 days of discharge to remove the rhinorocket.   Past medical and surgical history: She has atherosclerosis as well as cardiomegaly due to secondary cardiomyopathy. She has been on a bisphosphonate for osteoporosis for> 10 years. She also is on high-dose statin for dyslipidemia. Labs 07/17/16 were reviewed. She had minimal renal insufficiency with creatinine 1.04 and GFR of 46. She is on losartan as well as spironolactone. Potassium has ranged from 3.7-4.1.  Social history: Updated  Family  history: Updated as it was blank  Comprehensive review of systems: Despite the complicated hospital course ,her only complaints at this time are some soreness in her buttocks from lying in bed and some fatigue. She denies any bleeding dyscrasias at this time except for easy bruising.  She has no active cardiopulmonary symptoms. She describes some "gas" after meals.  Constitutional: No fever,significant weight change  Eyes: No redness, discharge, pain, vision change ENT/mouth: No nasal congestion,  purulent discharge, earache,change in hearing ,sore throat  Cardiovascular: No chest pain, palpitations,paroxysmal nocturnal dyspnea, claudication, edema  Respiratory: No cough, sputum production,hemoptysis, DOE , significant snoring,apnea   Gastrointestinal: No heartburn,dysphagia,abdominal pain, nausea / vomiting,rectal bleeding, melena,change in bowels Genitourinary: No dysuria,hematuria, pyuria,  incontinence, nocturia Musculoskeletal: No joint stiffness, joint swelling, pain Dermatologic: No rash, pruritus, change in appearance of skin Neurologic: No dizziness,headache,syncope, seizures, numbness , tingling Psychiatric: No significant anxiety , depression, insomnia, anorexia Endocrine: No change in hair/skin/ nails, excessive thirst, excessive hunger, excessive urination  Hematologic/lymphatic: No significant lymphadenopathy,abnormal bleeding Allergy/immunology: No itchy/ watery eyes, significant sneezing, urticaria, angioedema  Physical exam:  Pertinent or positive findings: She appears her stated age. She is very spry and communicative. She is oriented 3. There is slight asymmetry of  Ptosis; ptosis is greater on the right than the left. She has an upper plate. Heart sounds are distant and irregular. She has trace ankle edema. Pedal pulses are decreased. She has isolated DIP arthritic changes. General appearance:Adequately nourished; no acute distress , increased work of breathing is  present.   Lymphatic: No lymphadenopathy about the head, neck, axilla . Eyes: No conjunctival inflammation or lid edema is present. There is no scleral icterus. Ears:  External ear exam shows no significant lesions or deformities.   Nose:  External nasal examination shows no deformity or inflammation. Nasal mucosa are pink and moist without lesions ,exudates Oral exam: lips and gums are healthy appearing.There is no oropharyngeal erythema or exudate . Neck:  No thyromegaly, masses, tenderness noted.    Heart:  No gallop, murmur, click, rub .  Lungs:Chest clear to auscultation without wheezes, rhonchi,rales , rubs. Abdomen:Bowel sounds are normal. Abdomen is soft and nontender with no organomegaly, hernias,masses. GU: deferred as previously addressed. Extremities:  No cyanosis, clubbing,edema  Neurologic exam : Strength decreased  in upper & lower extremities Balance,Rhomberg,finger to nose testing could not be completed due to clinical state Deep tendon reflexes are equal Skin: Warm & dry w/o tenting.Bruising over forearms.Isolated keratoses. No significant lesions or rash.  See clinical summary under each active problem in the Problem List with associated updated therapeutic plan

## 2016-07-18 NOTE — Assessment & Plan Note (Signed)
D/C Alendronate

## 2016-07-18 NOTE — Patient Instructions (Addendum)
As she has been on bisphosphonate for over 10 years; discontinue alendronate. Please verify follow-up appointment with Dr. Redmond Baseman, ENT to remove nasal packing.D/C Summary stated F/U within 1 week. 07/25/16:BMET because of mild renal insufficiency in the context of spironolactone and losartan therapy. CBC because of epistaxis and Eliquis therapy.

## 2016-07-19 DIAGNOSIS — R040 Epistaxis: Secondary | ICD-10-CM | POA: Diagnosis not present

## 2016-07-25 ENCOUNTER — Encounter (HOSPITAL_COMMUNITY)
Admission: RE | Admit: 2016-07-25 | Discharge: 2016-07-25 | Disposition: A | Discharge status: Home / Self Care | Payer: Medicare Other | Source: Skilled Nursing Facility | Attending: Internal Medicine | Admitting: Internal Medicine

## 2016-07-25 DIAGNOSIS — I517 Cardiomegaly: Secondary | ICD-10-CM | POA: Insufficient documentation

## 2016-07-25 DIAGNOSIS — I5022 Chronic systolic (congestive) heart failure: Secondary | ICD-10-CM | POA: Insufficient documentation

## 2016-07-25 DIAGNOSIS — H3530 Unspecified macular degeneration: Secondary | ICD-10-CM | POA: Insufficient documentation

## 2016-07-25 DIAGNOSIS — J449 Chronic obstructive pulmonary disease, unspecified: Secondary | ICD-10-CM | POA: Insufficient documentation

## 2016-07-25 DIAGNOSIS — I2692 Saddle embolus of pulmonary artery without acute cor pulmonale: Secondary | ICD-10-CM | POA: Insufficient documentation

## 2016-07-25 LAB — CBC WITH DIFFERENTIAL/PLATELET
BASOS PCT: 0 %
Basophils Absolute: 0 10*3/uL (ref 0.0–0.1)
EOS ABS: 0.1 10*3/uL (ref 0.0–0.7)
EOS PCT: 2 %
HCT: 40.3 % (ref 36.0–46.0)
HEMOGLOBIN: 12.8 g/dL (ref 12.0–15.0)
Lymphocytes Relative: 19 %
Lymphs Abs: 1.3 10*3/uL (ref 0.7–4.0)
MCH: 29.9 pg (ref 26.0–34.0)
MCHC: 31.8 g/dL (ref 30.0–36.0)
MCV: 94.2 fL (ref 78.0–100.0)
MONOS PCT: 10 %
Monocytes Absolute: 0.7 10*3/uL (ref 0.1–1.0)
NEUTROS PCT: 69 %
Neutro Abs: 4.5 10*3/uL (ref 1.7–7.7)
PLATELETS: 227 10*3/uL (ref 150–400)
RBC: 4.28 MIL/uL (ref 3.87–5.11)
RDW: 13.8 % (ref 11.5–15.5)
WBC: 6.6 10*3/uL (ref 4.0–10.5)

## 2016-07-31 ENCOUNTER — Encounter: Payer: Self-pay | Admitting: Physician Assistant

## 2016-07-31 NOTE — Progress Notes (Signed)
Cardiology Office Note    Date:  08/01/2016  ID:  NIRALI TURNBAUGH, DOB 09/19/1924, MRN [id number] PCP:  Purvis Kilts, MD  Cardiologist:  Harrington Challenger  Chief Complaint: f/u CHF  History of Present Illness:  CAROLAN LIFE is a 82 y.o. female with history of macular degeneration, osteoporosis, and a multitude of recently diagnosed issues including PE, epistaxis, PAT, hyponatremia, and systolic CHF. She was admitted 06/2016 with SOB and found to have acute PE with evidence of right heart strain. LE duplex neg. She developed epistaxis on heparin requiring nasal packing. She was subsequently transitioned to Eliquis. She also developed acute respiratory failure with hypoxia requiring O2, paroxysmal atrial tach requiring amiodarone and coreg, and acute systolic CHF with EF of [id number]. She had elevated troponin felt likely due to demand ischemia, peak 0.21. 2D Echo 07/04/16: mild LVH, EF 20%, diffuse HK with multiple WMA, restrictive physiology, mild AI, pseudo-aortic-stenosis, mild MR, mild LAE, mod-severe RV dysfunction. Conservative management was recommended and palliative care was involved. Labs 07/10/16 showed BUN 29/Cr 0.98, Na 132, K 4.7, Hgb 13. TSH during adm nl, LFTs ok.  She returns for follow-up overall feeling well. For the above problem list she appears quite viable and lively. Denies any CP, SOB, dizziness, LEE, or syncope. No issues on Eliquis so far. BP running softer in clinic - recheck by me 98/72. Not symptomatic with this.   Past Medical History:  Diagnosis Date  . Cataracts, bilateral   . Chronic systolic CHF (congestive heart failure) ([place])    a. dx 06/2016 - conservative rx recommended. EF 20% with biventricular failure in setting of PE.  . Demand ischemia ([place])   . Epistaxis   . Hyponatremia   . Hyponatremia   . Macular degeneration    dry OD;wet OS. S/P injections  . Osteoporosis    07/18/16 previously on Premarin; S/P > 10 years Alendronate  . Paroxysmal atrial tachycardia  ([place])    a. placed on amiodarone 06/2016.  . Pulmonary emboli ([place]) 06/2016    Past Surgical History:  Procedure Laterality Date  . CATARACT EXTRACTION     bilaterally  . CHOLECYSTECTOMY    . TONSILLECTOMY      Current Medications: Outpatient Medications Prior to Visit  Medication Sig Dispense Refill  . amiodarone (PACERONE) 200 MG tablet Take 1 tablet (200 mg total) by mouth daily. 30 tablet 0  . apixaban (ELIQUIS) 5 MG TABS tablet Take 1 tablet (5 mg total) by mouth 2 (two) times daily. 60 tablet 0  . atorvastatin (LIPITOR) 80 MG tablet Take 1 tablet (80 mg total) by mouth daily at 6 PM. 30 tablet 0  . calcium carbonate (OSCAL) 1500 (600 Ca) MG TABS tablet Take 1,500 mg by mouth 2 (two) times daily with a meal.    . carvedilol (COREG) 3.125 MG tablet Take 1 tablet (3.125 mg total) by mouth 2 (two) times daily with a meal. 60 tablet 0  . furosemide (LASIX) 40 MG tablet Take 1 tablet (40 mg total) by mouth daily. 30 tablet 0  . losartan (COZAAR) 25 MG tablet Take 0.5 tablets (12.5 mg total) by mouth daily. 30 tablet 0  . Multiple Vitamins-Minerals (ICAPS PO) Take 1 capsule by mouth 2 (two) times daily.    Marland Kitchen spironolactone (ALDACTONE) 25 MG tablet Take 0.5 tablets (12.5 mg total) by mouth daily. 30 tablet 0   No facility-administered medications prior to visit.      Allergies:   Codeine and Penicillins  Social History   Social History  . Marital status: Married    Spouse name: N/A  . Number of children: N/A  . Years of education: N/A   Social History Main Topics  . Smoking status: Former Smoker    Types: Cigarettes    Quit date: 27  . Smokeless tobacco: None  . Alcohol use No  . Drug use: No  . Sexual activity: Not Currently   Other Topics Concern  . None   Social History Narrative  . None     Family History:  The patient's family history includes Cancer in her sister; Diabetes in her father; Heart disease in her mother; Stroke in her mother.   ROS:   Please  see the history of present illness.  All other systems are reviewed and otherwise negative.    PHYSICAL EXAM:   VS:  BP (!) 98/50   Pulse 80   Ht 5\' 3"  (1.6 m)   Wt 133 lb (60.3 kg)   SpO2 96%   BMI 23.56 kg/m   BMI: Body mass index is 23.56 kg/m. GEN: Well nourished, well developed thin WF, in no acute distress  HEENT: normocephalic, atraumatic Neck: no JVD, carotid bruits, or masses Cardiac: RRR; no murmurs, rubs, or gallops, no edema  Respiratory:  clear to auscultation bilaterally, normal work of breathing GI: soft, nontender, nondistended, + BS MS: no deformity or atrophy  Skin: warm and dry, no rash Neuro:  Alert and Oriented x 3, Strength and sensation are intact, follows commands Psych: euthymic mood, full affect  Wt Readings from Last 3 Encounters:  08/01/16 133 lb (60.3 kg)  07/18/16 134 lb 9.6 oz (61.1 kg)  07/15/16 139 lb 1.8 oz (63.1 kg)      Studies/Labs Reviewed:   EKG:  EKG was ordered today and personally reviewed by me and demonstrates sinus bradycardia 57bpm, nonspecific ST-T Changes, RBBB  Recent Labs: 07/03/2016: B Natriuretic Peptide 737.0; Magnesium 1.9; TSH 2.258 07/04/2016: ALT 32 07/17/2016: BUN 23; Creatinine, Ser 1.04; Potassium 3.9; Sodium 137 07/25/2016: Hemoglobin 12.8; Platelets 227   Lipid Panel    Component Value Date/Time   CHOL 141 07/04/2016 0443   TRIG 41 07/04/2016 0443   HDL 56 07/04/2016 0443   CHOLHDL 2.5 07/04/2016 0443   VLDL 8 07/04/2016 0443   LDLCALC 77 07/04/2016 0443    Additional studies/ records that were reviewed today include: Summarized above.    ASSESSMENT & PLAN:   1. Chronic systolic CHF - doing well. Her BP is on the lower side. She is asymptomatic. Will check BMET and CBC today and use that to help guide further med changes. I suspect she probably does not need both Lasix and spironolactone on board now that she is euvolemic. Will f/u BMET to make the final call. 2. Demand ischemia - no anginal sx.  Continue conservative management and observation for further sx. 3. History of PE - continue Eliquis. Will defer ultimate duration to her primary care provider. Note that dosing is appropriate for PE (the 2.5mg  dose is typically reserved for stroke prophylaxis in setting of afib per med review). We discussed importance of observing for any bleeding. 4. Paroxysmal atrial tach - maintaining NSR on amiodarone. Further monitoring of organ systems while on amiodarone will be at discretion of primary cardiologist. Recent LFTs, TSH were OK.  Disposition: F/u with Dr. Harrington Challenger in ~6 weeks.   Medication Adjustments/Labs and Tests Ordered: Current medicines are reviewed at length with the patient today.  Concerns  regarding medicines are outlined above. Medication changes, Labs and Tests ordered today are summarized above and listed in the Patient Instructions accessible in Encounters.   Signed, Melina Copa PA-C  08/01/2016 1:56 PM    [facility] Location in [place]. [place], [place] [address] Ph: [phone]; Fax [phone]

## 2016-08-01 ENCOUNTER — Ambulatory Visit (INDEPENDENT_AMBULATORY_CARE_PROVIDER_SITE_OTHER): Payer: Medicare Other | Admitting: Physician Assistant

## 2016-08-01 ENCOUNTER — Other Ambulatory Visit (HOSPITAL_COMMUNITY)
Admission: RE | Admit: 2016-08-01 | Discharge: 2016-08-01 | Disposition: A | Discharge status: Home / Self Care | Payer: Medicare Other | Source: Ambulatory Visit | Attending: Physician Assistant | Admitting: Physician Assistant

## 2016-08-01 ENCOUNTER — Encounter: Payer: Self-pay | Admitting: Physician Assistant

## 2016-08-01 VITALS — BP 98/50 | HR 80 | Ht 63.0 in | Wt 133.0 lb

## 2016-08-01 DIAGNOSIS — I5022 Chronic systolic (congestive) heart failure: Secondary | ICD-10-CM

## 2016-08-01 DIAGNOSIS — Z86711 Personal history of pulmonary embolism: Secondary | ICD-10-CM

## 2016-08-01 DIAGNOSIS — I471 Supraventricular tachycardia: Secondary | ICD-10-CM

## 2016-08-01 DIAGNOSIS — I248 Other forms of acute ischemic heart disease: Secondary | ICD-10-CM

## 2016-08-01 LAB — BASIC METABOLIC PANEL
Anion gap: 7 (ref 5–15)
BUN: 29 mg/dL — ABNORMAL HIGH (ref 6–20)
CO2: 33 mmol/L — ABNORMAL HIGH (ref 22–32)
Calcium: 8.6 mg/dL — ABNORMAL LOW (ref 8.9–10.3)
Chloride: 94 mmol/L — ABNORMAL LOW (ref 101–111)
Creatinine, Ser: 1.14 mg/dL — ABNORMAL HIGH (ref 0.44–1.00)
GFR calc Af Amer: 48 mL/min — ABNORMAL LOW (ref >60)
GFR calc non Af Amer: 41 mL/min — ABNORMAL LOW (ref >60)
Glucose, Bld: 160 mg/dL — ABNORMAL HIGH (ref 65–99)
Potassium: 4.3 mmol/L (ref 3.5–5.1)
Sodium: 134 mmol/L — ABNORMAL LOW (ref 135–145)

## 2016-08-01 LAB — CBC WITH DIFFERENTIAL/PLATELET
BASOS ABS: 0 10*3/uL (ref 0.0–0.1)
BASOS PCT: 0 %
EOS ABS: 0.1 10*3/uL (ref 0.0–0.7)
Eosinophils Relative: 2 %
HCT: 40.5 % (ref 36.0–46.0)
HEMOGLOBIN: 13.1 g/dL (ref 12.0–15.0)
Lymphocytes Relative: 18 %
Lymphs Abs: 1.3 10*3/uL (ref 0.7–4.0)
MCH: 30.5 pg (ref 26.0–34.0)
MCHC: 32.3 g/dL (ref 30.0–36.0)
MCV: 94.2 fL (ref 78.0–100.0)
MONOS PCT: 8 %
Monocytes Absolute: 0.6 10*3/uL (ref 0.1–1.0)
NEUTROS PCT: 72 %
Neutro Abs: 5.4 10*3/uL (ref 1.7–7.7)
Platelets: 191 10*3/uL (ref 150–400)
RBC: 4.3 MIL/uL (ref 3.87–5.11)
RDW: 14.3 % (ref 11.5–15.5)
WBC: 7.4 10*3/uL (ref 4.0–10.5)

## 2016-08-01 NOTE — Patient Instructions (Signed)
Your physician recommends that you schedule a follow-up appointment in: 6-8 Weeks with Dr. Harrington Challenger.   Your physician recommends that you continue on your current medications as directed. Please refer to the Current Medication list given to you today.  Your physician recommends that you have for lab work done today.   If you need a refill on your cardiac medications before your next appointment, please call your pharmacy.  Thank you for choosing [facility]!

## 2016-08-02 ENCOUNTER — Non-Acute Institutional Stay (SKILLED_NURSING_FACILITY): Payer: Medicare Other | Admitting: Internal Medicine

## 2016-08-02 ENCOUNTER — Telehealth: Payer: Self-pay | Admitting: *Deleted

## 2016-08-02 ENCOUNTER — Encounter: Payer: Self-pay | Admitting: Internal Medicine

## 2016-08-02 DIAGNOSIS — M810 Age-related osteoporosis without current pathological fracture: Secondary | ICD-10-CM | POA: Diagnosis not present

## 2016-08-02 DIAGNOSIS — I5021 Acute systolic (congestive) heart failure: Secondary | ICD-10-CM | POA: Diagnosis not present

## 2016-08-02 DIAGNOSIS — I471 Supraventricular tachycardia: Secondary | ICD-10-CM

## 2016-08-02 DIAGNOSIS — I2699 Other pulmonary embolism without acute cor pulmonale: Secondary | ICD-10-CM | POA: Diagnosis not present

## 2016-08-02 NOTE — Telephone Encounter (Signed)
-----   Message from Charlie Pitter, [place] sent at 08/01/2016  4:28 PM EDT ----- Please call patient. Labs show she's a little dried out. Please stop Lasix. Call [facility] and have them follow her BP and call if it's remaining on the low side. Dayna Dunn PA-C

## 2016-08-02 NOTE — Progress Notes (Signed)
This is a discharge note.  Level of care skilled.  Facility [facility].  Chief complaint discharge note.  History of present illness.  Patient is a pleasant 82 year old female who is here for rehabilitation and strengthening after sustaining a acute pulmonary embolism.  She was hospitalized from July 10 2 07/15/2016.  She initially was treated with heparin but developed  nose bleeding  required anteriorly nasal packing.  Once bleeding was controlled she was started on Eliquist--  She did have atrial tachycardia and rate control was achieved with amiodarone and Coreg.  She does have a severely reduced ejection fraction at 20%.  This is been complicated somewhat with a history of hypotension actually was seen by cardiology earlier this week with recommendation to discontinue the Lasix she continues on spironolactone.  Currently she has no complaints blood pressure taken manually today was 110/70.  She does not complain of any shortness of breath or chest pain.   She is looking forward to getting back to her previous residence in assisted living facility---appears to have done well here Past Medical History  Diagnosis Date  . Osteoporosis   . Cataracts, bilateral   . Macular degeneration             Past Surgical History  Procedure Laterality Date  . Cataract extraction      bilaterally  . Tonsillectomy    . Cholecystectomy        Social History:          Social History  Substance Use Topics  . Smoking status: Former Smoker    Types: Cigarettes  . Smokeless tobacco: Not on file  . Alcohol Use: No     Lives - at home  Mobility - ambulates by self    Family History :    History reviewed. No pertinent family history.   Negative for blood clot.      Medications have been reviewed per [facility]    amiodarone 200 MG tablet  Commonly known as:  PACERONE  Take 1 tablet (200 mg total) by mouth daily.  Start taking on:  07/16/2016      apixaban 5 MG Tabs tablet  Commonly known as:  ELIQUIS  Take 1 tablet (5 mg total) by mouth 2 (two) times daily.     atorvastatin 80 MG tablet  Commonly known as:  LIPITOR  Take 1 tablet (80 mg total) by mouth daily at 6 PM.     calcium carbonate 1500 (600 Ca) MG Tabs tablet  Commonly known as:  OSCAL  Take 1,500 mg by mouth 2 (two) times daily with a meal.     carvedilol 3.125 MG tablet  Commonly known as:  COREG  Take 1 tablet (3.125 mg total) by mouth 2 (two) times daily with a meal.               ICAPS PO  Take 1 capsule by mouth 2 (two) times daily.     losartan 25 MG tablet  Commonly known as:  COZAAR  Take 0.5 tablets (12.5 mg total) by mouth daily.     spironolactone 25 MG tablet  Commonly known as:  ALDACTONE  Take 0.5 tablets (12.5 mg total) by mouth daily.       In view of systems.  In general no complaints of fever chills.  Skin is not complaining of rashes or itching.  Head ears eyes nose mouth and throat does not complaining of visual changes or sore throat does have  prescription lenses.  Respirations not complaining of shortness breath or cough.  Cardiac no chest pain trace lower from be edema.  GU does not complain of dysuria.  GI is not complaining of abdominal pain nausea vomiting diarrhea or constipation.  Musculoskeletal does not complain currently of joint pain ambulating in a wheelchair currently.  Neurologic is not complaining of dizziness headache or syncope.  Psych is not complaining of overt anxiety or depression  Visual exam.  Temperature 98.7 pulse 64 respirations 20 blood pressure taken manually 110/70.  In general this is a very pleasant elderly female in no distress.  Her skin is warm and dry.  Eyes has prescription lenses pupils appear reactive to light visual acuity appears grossly intact.  Oropharynx is clear mucous membranes moist.  Chest is clear to auscultation there is no labored  breathing.  Heart is regular rate and rhythm without murmur gallop or rub she has trace lower extremity edema.  Muscle skeletal moves all extremities 4 I do not note changes other than arthritic diffuse ambulating in a wheelchair.  Neurologic is grossly intact her speech is clear no lateralizing findings.  Psych she is alert nor head very pleasant and appropriate  Abs.  08/01/2016.  Sodium 134 potassium 4.3 BUN 29 creatinine 1.14.  WBC 7.4 hemoglobin 13.1 platelets 191.  Assessment plan.  #1 history of pulmonary embolism-clinically appears to be stable continues on ELIQUIST--does not complain of any chest pain or shortness of breath will need expedient follow-up by primary care provider.  #2 history of atrial tachycardia this appears rate controlled on Coreg and amiodarone is on eliquist for anticoagulation.  #3 history of systolic CHF this appears to be stable again she has seen cardiology with recommendation to discontinue the Lasix secondary to hypotension concerns-blood pressure appears stable at 110/70 today-she continues on spironolactone will need expedient follow-up by primary care provider.  #4 history of osteoporosis she continues on calcium this has been evaluated by Dr. Linna Darner previously and Fosamax has been discontinued.  5 hypertension-again there were concerns with hypotension per cardiology Lasix was recommended to be discontinued-she continues on spironolactone also continues on losartan 12.5 mg a day.  Patient will be going back to her assisted-living is looking forward very much to that-again will need follow-up by primary care provider for above-stated issues  [id number]-of note greater than 30 minutes spent on this discharge summary-greater than 50% of time spent reviewing labs-her chart-discussing her status with nursing staff-and coordinating plan of care

## 2016-08-02 NOTE — Telephone Encounter (Signed)
Lasix stopped. [facility] informed.

## 2016-08-04 DIAGNOSIS — Z9181 History of falling: Secondary | ICD-10-CM | POA: Diagnosis not present

## 2016-08-04 DIAGNOSIS — I519 Heart disease, unspecified: Secondary | ICD-10-CM | POA: Diagnosis not present

## 2016-08-04 DIAGNOSIS — I5022 Chronic systolic (congestive) heart failure: Secondary | ICD-10-CM | POA: Diagnosis not present

## 2016-08-04 DIAGNOSIS — I2699 Other pulmonary embolism without acute cor pulmonale: Secondary | ICD-10-CM | POA: Diagnosis not present

## 2016-08-04 DIAGNOSIS — I959 Hypotension, unspecified: Secondary | ICD-10-CM | POA: Diagnosis not present

## 2016-08-04 DIAGNOSIS — Z7901 Long term (current) use of anticoagulants: Secondary | ICD-10-CM | POA: Diagnosis not present

## 2016-08-04 DIAGNOSIS — I2510 Atherosclerotic heart disease of native coronary artery without angina pectoris: Secondary | ICD-10-CM | POA: Diagnosis not present

## 2016-08-04 DIAGNOSIS — Z87891 Personal history of nicotine dependence: Secondary | ICD-10-CM | POA: Diagnosis not present

## 2016-08-04 DIAGNOSIS — E785 Hyperlipidemia, unspecified: Secondary | ICD-10-CM | POA: Diagnosis not present

## 2016-08-04 DIAGNOSIS — H3530 Unspecified macular degeneration: Secondary | ICD-10-CM | POA: Diagnosis not present

## 2016-08-04 DIAGNOSIS — I471 Supraventricular tachycardia: Secondary | ICD-10-CM | POA: Diagnosis not present

## 2016-08-04 DIAGNOSIS — M810 Age-related osteoporosis without current pathological fracture: Secondary | ICD-10-CM | POA: Diagnosis not present

## 2016-08-04 DIAGNOSIS — I4891 Unspecified atrial fibrillation: Secondary | ICD-10-CM | POA: Diagnosis not present

## 2016-08-04 DIAGNOSIS — I248 Other forms of acute ischemic heart disease: Secondary | ICD-10-CM | POA: Diagnosis not present

## 2016-08-04 DIAGNOSIS — I429 Cardiomyopathy, unspecified: Secondary | ICD-10-CM | POA: Diagnosis not present

## 2016-08-07 DIAGNOSIS — I2510 Atherosclerotic heart disease of native coronary artery without angina pectoris: Secondary | ICD-10-CM | POA: Diagnosis not present

## 2016-08-07 DIAGNOSIS — I5022 Chronic systolic (congestive) heart failure: Secondary | ICD-10-CM | POA: Diagnosis not present

## 2016-08-07 DIAGNOSIS — I5023 Acute on chronic systolic (congestive) heart failure: Secondary | ICD-10-CM | POA: Diagnosis not present

## 2016-08-07 DIAGNOSIS — I429 Cardiomyopathy, unspecified: Secondary | ICD-10-CM | POA: Diagnosis not present

## 2016-08-07 DIAGNOSIS — I2699 Other pulmonary embolism without acute cor pulmonale: Secondary | ICD-10-CM | POA: Diagnosis not present

## 2016-08-07 DIAGNOSIS — I471 Supraventricular tachycardia: Secondary | ICD-10-CM | POA: Diagnosis not present

## 2016-08-07 DIAGNOSIS — I248 Other forms of acute ischemic heart disease: Secondary | ICD-10-CM | POA: Diagnosis not present

## 2016-08-07 NOTE — Progress Notes (Signed)
Location:   [facility] Room Number: 115/w Place of Service:  SNF [phone])  Provider: Granville Lewis  PCP: Purvis Kilts, MD Patient Care Team: Sharilyn Sites, MD as PCP - General (Family Medicine)  Extended Emergency Contact Information Primary Emergency Contact: [facility] Address: [address] [place]          [place], [place] [address] [place] Phone: [phone] Relation: Spouse Secondary Emergency Contact: Lance Coon, [place] [address] [place] Phone: [phone] Mobile Phone: [phone] Relation: Niece  Code Status: DNR Goals of care:  Advanced Directive information Advanced Directives 08/02/2016  Does patient have an advance directive? Yes  Type of Paramedic of [place];Out of facility DNR (pink MOST or yellow form)  Does patient want to make changes to advanced directive? No - Patient declined  Copy of advanced directive(s) in chart? Yes  Would patient like information on creating an advanced directive? -     Allergies  Allergen Reactions  . Codeine Nausea Only  . Penicillins Other (See Comments)    "Not allergic just a lot of my family members are allergic". Has patient had a PCN reaction causing immediate rash, facial/tongue/throat swelling, SOB or lightheadedness with hypotension: No Has patient had a PCN reaction causing severe rash involving mucus membranes or skin necrosis: No Has patient had a PCN reaction that required hospitalization No Has patient had a PCN reaction occurring within the last 10 years: No If all of the above answers are "NO", then may proceed with Cephalosporin use.     Chief Complaint  Patient presents with  . Discharge Note    Wants to stop Lasix    HPI:  82 y.o. female      Past Medical History:  Diagnosis Date  . Cataracts, bilateral   . Chronic systolic CHF (congestive heart failure) ([place])    a. dx 06/2016 - conservative rx  recommended. EF 20% with biventricular failure in setting of PE.  . Demand ischemia ([place])   . Epistaxis   . Hyponatremia   . Hyponatremia   . Macular degeneration    dry OD;wet OS. S/P injections  . Osteoporosis    07/18/16 previously on Premarin; S/P > 10 years Alendronate  . Paroxysmal atrial tachycardia ([place])    a. placed on amiodarone 06/2016.  . Pulmonary emboli ([place]) 06/2016    Past Surgical History:  Procedure Laterality Date  . CATARACT EXTRACTION     bilaterally  . CHOLECYSTECTOMY    . TONSILLECTOMY        reports that she quit smoking about 42 years ago. Her smoking use included Cigarettes. She has never used smokeless tobacco. She reports that she does not drink alcohol or use drugs. Social History   Social History  . Marital status: Married    Spouse name: N/A  . Number of children: N/A  . Years of education: N/A   Occupational History  . Not on file.   Social History Main Topics  . Smoking status: Former Smoker    Types: Cigarettes    Quit date: 72  . Smokeless tobacco: Never Used  . Alcohol use No  . Drug use: No  . Sexual activity: Not Currently   Other Topics Concern  . Not on file   Social History Narrative  . No narrative on file   Functional Status Survey:    Allergies  Allergen Reactions  . Codeine  Nausea Only  . Penicillins Other (See Comments)    "Not allergic just a lot of my family members are allergic". Has patient had a PCN reaction causing immediate rash, facial/tongue/throat swelling, SOB or lightheadedness with hypotension: No Has patient had a PCN reaction causing severe rash involving mucus membranes or skin necrosis: No Has patient had a PCN reaction that required hospitalization No Has patient had a PCN reaction occurring within the last 10 years: No If all of the above answers are "NO", then may proceed with Cephalosporin use.     Pertinent  Health Maintenance Due  Topic Date Due  . PNA vac Low Risk Adult (1 of 2 -  PCV13) 08/26/1991  . INFLUENZA VACCINE  11/24/2016 (Originally 07/25/2016)  . DEXA SCAN  Completed    Medications: Current Outpatient Prescriptions on File Prior to Visit  Medication Sig Dispense Refill  . amiodarone (PACERONE) 200 MG tablet Take 1 tablet (200 mg total) by mouth daily. 30 tablet 0  . apixaban (ELIQUIS) 5 MG TABS tablet Take 1 tablet (5 mg total) by mouth 2 (two) times daily. 60 tablet 0  . atorvastatin (LIPITOR) 80 MG tablet Take 1 tablet (80 mg total) by mouth daily at 6 PM. 30 tablet 0  . calcium carbonate (OSCAL) 1500 (600 Ca) MG TABS tablet Take 1,500 mg by mouth 2 (two) times daily with a meal.    . carvedilol (COREG) 3.125 MG tablet Take 1 tablet (3.125 mg total) by mouth 2 (two) times daily with a meal. 60 tablet 0  . losartan (COZAAR) 25 MG tablet Take 0.5 tablets (12.5 mg total) by mouth daily. 30 tablet 0  . Multiple Vitamins-Minerals (ICAPS PO) Take 1 capsule by mouth 2 (two) times daily.    Marland Kitchen spironolactone (ALDACTONE) 25 MG tablet Take 0.5 tablets (12.5 mg total) by mouth daily. 30 tablet 0   No current facility-administered medications on file prior to visit.      Review of Systems  Vitals:   08/02/16 1239  BP: 100/70  Pulse: (!) 51  Resp: 16  Temp: 98.3 F (36.8 C)  TempSrc: Oral  SpO2: 98%  Weight: 134 lb 12.8 oz (61.1 kg)  Height: 5\' 3"  (1.6 m)   Body mass index is 23.88 kg/m. Physical Exam  Labs reviewed: Basic Metabolic Panel:  Recent Labs  07/03/16 1145  07/14/16 0453 07/17/16 0615 08/01/16 1430  NA 135  < > 137 137 134*  K 4.6  < > 3.9 3.9 4.3  CL 101  < > 95* 97* 94*  CO2 26  < > 33* 32 33*  GLUCOSE 93  < > 93 94 160*  BUN 21*  < > 21* 23* 29*  CREATININE 0.78  < > 0.92 1.04* 1.14*  CALCIUM 8.9  < > 8.8* 8.7* 8.6*  MG 1.9  --   --   --   --   < > = values in this interval not displayed. Liver Function Tests:  Recent Labs  07/04/16 0443  AST 33  ALT 32  ALKPHOS 93  BILITOT 0.8  PROT 5.5*  ALBUMIN 3.1*   No  results for input(s): LIPASE, AMYLASE in the last 8760 hours. No results for input(s): AMMONIA in the last 8760 hours. CBC:  Recent Labs  07/17/16 0615 07/25/16 0734 08/01/16 1430  WBC 7.6 6.6 7.4  NEUTROABS 5.5 4.5 5.4  HGB 13.1 12.8 13.1  HCT 40.1 40.3 40.5  MCV 93.5 94.2 94.2  PLT 242 227 191  Cardiac Enzymes:  Recent Labs  07/03/16 1738 07/03/16 2228 07/04/16 0443  TROPONINI 0.18* 0.20* 0.18*   BNP: Invalid input(s): POCBNP CBG: No results for input(s): GLUCAP in the last 8760 hours.  Procedures and Imaging Studies During Stay: Dg Chest Port 1 View  Result Date: 07/11/2016 CLINICAL DATA:  Patient with history of respiratory failure. EXAM: PORTABLE CHEST 1 VIEW COMPARISON:  Chest CT 07/03/2016; chest radiograph 07/03/2016. FINDINGS: Monitoring leads overlie the patient. Stable cardiomegaly. Tortuosity and calcification of the thoracic aorta. Moderate bilateral pleural effusions with underlying pulmonary consolidation. Scattered heterogeneous opacities throughout the lungs bilaterally. No pneumothorax. IMPRESSION: Moderate bilateral pleural effusions with underlying pulmonary consolidation most compatible with atelectasis. Additionally there are heterogeneous opacities involving the upper lungs bilaterally which may represent associated pulmonary edema and or chronic interstitial process. Electronically Signed   By: Lovey Newcomer M.D.   On: 07/11/2016 10:30    Assessment/Plan:   There are no diagnoses linked to this encounter.   Patient is being discharged with the following home health services:    Patient is being discharged with the following durable medical equipment:    Patient has been advised to f/u with their PCP in 1-2 weeks to bring them up to date on their rehab stay.  Social services at facility was responsible for arranging this appointment.  Pt was provided with a 30 day supply of prescriptions for medications and refills must be obtained from their PCP.   For controlled substances, a more limited supply may be provided adequate until PCP appointment only.  Future labs/tests needed:

## 2016-08-08 DIAGNOSIS — I248 Other forms of acute ischemic heart disease: Secondary | ICD-10-CM | POA: Diagnosis not present

## 2016-08-08 DIAGNOSIS — I471 Supraventricular tachycardia: Secondary | ICD-10-CM | POA: Diagnosis not present

## 2016-08-08 DIAGNOSIS — I2510 Atherosclerotic heart disease of native coronary artery without angina pectoris: Secondary | ICD-10-CM | POA: Diagnosis not present

## 2016-08-08 DIAGNOSIS — I2699 Other pulmonary embolism without acute cor pulmonale: Secondary | ICD-10-CM | POA: Diagnosis not present

## 2016-08-08 DIAGNOSIS — I5022 Chronic systolic (congestive) heart failure: Secondary | ICD-10-CM | POA: Diagnosis not present

## 2016-08-08 DIAGNOSIS — I429 Cardiomyopathy, unspecified: Secondary | ICD-10-CM | POA: Diagnosis not present

## 2016-08-09 DIAGNOSIS — I2699 Other pulmonary embolism without acute cor pulmonale: Secondary | ICD-10-CM | POA: Diagnosis not present

## 2016-08-09 DIAGNOSIS — I429 Cardiomyopathy, unspecified: Secondary | ICD-10-CM | POA: Diagnosis not present

## 2016-08-09 DIAGNOSIS — I471 Supraventricular tachycardia: Secondary | ICD-10-CM | POA: Diagnosis not present

## 2016-08-09 DIAGNOSIS — I248 Other forms of acute ischemic heart disease: Secondary | ICD-10-CM | POA: Diagnosis not present

## 2016-08-09 DIAGNOSIS — I5022 Chronic systolic (congestive) heart failure: Secondary | ICD-10-CM | POA: Diagnosis not present

## 2016-08-09 DIAGNOSIS — I2510 Atherosclerotic heart disease of native coronary artery without angina pectoris: Secondary | ICD-10-CM | POA: Diagnosis not present

## 2016-08-10 DIAGNOSIS — I471 Supraventricular tachycardia: Secondary | ICD-10-CM | POA: Diagnosis not present

## 2016-08-10 DIAGNOSIS — I429 Cardiomyopathy, unspecified: Secondary | ICD-10-CM | POA: Diagnosis not present

## 2016-08-10 DIAGNOSIS — I2699 Other pulmonary embolism without acute cor pulmonale: Secondary | ICD-10-CM | POA: Diagnosis not present

## 2016-08-10 DIAGNOSIS — I5022 Chronic systolic (congestive) heart failure: Secondary | ICD-10-CM | POA: Diagnosis not present

## 2016-08-10 DIAGNOSIS — I248 Other forms of acute ischemic heart disease: Secondary | ICD-10-CM | POA: Diagnosis not present

## 2016-08-10 DIAGNOSIS — I2510 Atherosclerotic heart disease of native coronary artery without angina pectoris: Secondary | ICD-10-CM | POA: Diagnosis not present

## 2016-08-14 DIAGNOSIS — I5022 Chronic systolic (congestive) heart failure: Secondary | ICD-10-CM | POA: Diagnosis not present

## 2016-08-14 DIAGNOSIS — I2699 Other pulmonary embolism without acute cor pulmonale: Secondary | ICD-10-CM | POA: Diagnosis not present

## 2016-08-14 DIAGNOSIS — I429 Cardiomyopathy, unspecified: Secondary | ICD-10-CM | POA: Diagnosis not present

## 2016-08-14 DIAGNOSIS — I471 Supraventricular tachycardia: Secondary | ICD-10-CM | POA: Diagnosis not present

## 2016-08-14 DIAGNOSIS — I2510 Atherosclerotic heart disease of native coronary artery without angina pectoris: Secondary | ICD-10-CM | POA: Diagnosis not present

## 2016-08-14 DIAGNOSIS — I248 Other forms of acute ischemic heart disease: Secondary | ICD-10-CM | POA: Diagnosis not present

## 2016-08-15 DIAGNOSIS — I248 Other forms of acute ischemic heart disease: Secondary | ICD-10-CM | POA: Diagnosis not present

## 2016-08-15 DIAGNOSIS — I2510 Atherosclerotic heart disease of native coronary artery without angina pectoris: Secondary | ICD-10-CM | POA: Diagnosis not present

## 2016-08-15 DIAGNOSIS — I5022 Chronic systolic (congestive) heart failure: Secondary | ICD-10-CM | POA: Diagnosis not present

## 2016-08-15 DIAGNOSIS — I2699 Other pulmonary embolism without acute cor pulmonale: Secondary | ICD-10-CM | POA: Diagnosis not present

## 2016-08-15 DIAGNOSIS — I471 Supraventricular tachycardia: Secondary | ICD-10-CM | POA: Diagnosis not present

## 2016-08-15 DIAGNOSIS — I429 Cardiomyopathy, unspecified: Secondary | ICD-10-CM | POA: Diagnosis not present

## 2016-08-16 DIAGNOSIS — R040 Epistaxis: Secondary | ICD-10-CM | POA: Diagnosis not present

## 2016-08-16 DIAGNOSIS — I2699 Other pulmonary embolism without acute cor pulmonale: Secondary | ICD-10-CM | POA: Diagnosis not present

## 2016-08-16 DIAGNOSIS — J069 Acute upper respiratory infection, unspecified: Secondary | ICD-10-CM | POA: Diagnosis not present

## 2016-08-16 DIAGNOSIS — Z1389 Encounter for screening for other disorder: Secondary | ICD-10-CM | POA: Diagnosis not present

## 2016-08-16 DIAGNOSIS — Z6823 Body mass index (BMI) 23.0-23.9, adult: Secondary | ICD-10-CM | POA: Diagnosis not present

## 2016-08-17 DIAGNOSIS — I5022 Chronic systolic (congestive) heart failure: Secondary | ICD-10-CM | POA: Diagnosis not present

## 2016-08-17 DIAGNOSIS — I2510 Atherosclerotic heart disease of native coronary artery without angina pectoris: Secondary | ICD-10-CM | POA: Diagnosis not present

## 2016-08-17 DIAGNOSIS — I471 Supraventricular tachycardia: Secondary | ICD-10-CM | POA: Diagnosis not present

## 2016-08-17 DIAGNOSIS — I429 Cardiomyopathy, unspecified: Secondary | ICD-10-CM | POA: Diagnosis not present

## 2016-08-17 DIAGNOSIS — I2699 Other pulmonary embolism without acute cor pulmonale: Secondary | ICD-10-CM | POA: Diagnosis not present

## 2016-08-17 DIAGNOSIS — I248 Other forms of acute ischemic heart disease: Secondary | ICD-10-CM | POA: Diagnosis not present

## 2016-08-18 DIAGNOSIS — I2699 Other pulmonary embolism without acute cor pulmonale: Secondary | ICD-10-CM | POA: Diagnosis not present

## 2016-08-18 DIAGNOSIS — I248 Other forms of acute ischemic heart disease: Secondary | ICD-10-CM | POA: Diagnosis not present

## 2016-08-18 DIAGNOSIS — I471 Supraventricular tachycardia: Secondary | ICD-10-CM | POA: Diagnosis not present

## 2016-08-18 DIAGNOSIS — I2510 Atherosclerotic heart disease of native coronary artery without angina pectoris: Secondary | ICD-10-CM | POA: Diagnosis not present

## 2016-08-18 DIAGNOSIS — I429 Cardiomyopathy, unspecified: Secondary | ICD-10-CM | POA: Diagnosis not present

## 2016-08-18 DIAGNOSIS — I5022 Chronic systolic (congestive) heart failure: Secondary | ICD-10-CM | POA: Diagnosis not present

## 2016-08-21 DIAGNOSIS — I429 Cardiomyopathy, unspecified: Secondary | ICD-10-CM | POA: Diagnosis not present

## 2016-08-21 DIAGNOSIS — I5022 Chronic systolic (congestive) heart failure: Secondary | ICD-10-CM | POA: Diagnosis not present

## 2016-08-21 DIAGNOSIS — I471 Supraventricular tachycardia: Secondary | ICD-10-CM | POA: Diagnosis not present

## 2016-08-21 DIAGNOSIS — I2510 Atherosclerotic heart disease of native coronary artery without angina pectoris: Secondary | ICD-10-CM | POA: Diagnosis not present

## 2016-08-21 DIAGNOSIS — I248 Other forms of acute ischemic heart disease: Secondary | ICD-10-CM | POA: Diagnosis not present

## 2016-08-21 DIAGNOSIS — I2699 Other pulmonary embolism without acute cor pulmonale: Secondary | ICD-10-CM | POA: Diagnosis not present

## 2016-08-24 DIAGNOSIS — I2510 Atherosclerotic heart disease of native coronary artery without angina pectoris: Secondary | ICD-10-CM | POA: Diagnosis not present

## 2016-08-24 DIAGNOSIS — I2699 Other pulmonary embolism without acute cor pulmonale: Secondary | ICD-10-CM | POA: Diagnosis not present

## 2016-08-24 DIAGNOSIS — I5022 Chronic systolic (congestive) heart failure: Secondary | ICD-10-CM | POA: Diagnosis not present

## 2016-08-24 DIAGNOSIS — I248 Other forms of acute ischemic heart disease: Secondary | ICD-10-CM | POA: Diagnosis not present

## 2016-08-24 DIAGNOSIS — I471 Supraventricular tachycardia: Secondary | ICD-10-CM | POA: Diagnosis not present

## 2016-08-24 DIAGNOSIS — I429 Cardiomyopathy, unspecified: Secondary | ICD-10-CM | POA: Diagnosis not present

## 2016-08-28 DIAGNOSIS — I471 Supraventricular tachycardia: Secondary | ICD-10-CM | POA: Diagnosis not present

## 2016-08-28 DIAGNOSIS — I2699 Other pulmonary embolism without acute cor pulmonale: Secondary | ICD-10-CM | POA: Diagnosis not present

## 2016-08-28 DIAGNOSIS — I2510 Atherosclerotic heart disease of native coronary artery without angina pectoris: Secondary | ICD-10-CM | POA: Diagnosis not present

## 2016-08-28 DIAGNOSIS — I429 Cardiomyopathy, unspecified: Secondary | ICD-10-CM | POA: Diagnosis not present

## 2016-08-28 DIAGNOSIS — I248 Other forms of acute ischemic heart disease: Secondary | ICD-10-CM | POA: Diagnosis not present

## 2016-08-28 DIAGNOSIS — I5022 Chronic systolic (congestive) heart failure: Secondary | ICD-10-CM | POA: Diagnosis not present

## 2016-08-30 DIAGNOSIS — I248 Other forms of acute ischemic heart disease: Secondary | ICD-10-CM | POA: Diagnosis not present

## 2016-08-30 DIAGNOSIS — I429 Cardiomyopathy, unspecified: Secondary | ICD-10-CM | POA: Diagnosis not present

## 2016-08-30 DIAGNOSIS — I471 Supraventricular tachycardia: Secondary | ICD-10-CM | POA: Diagnosis not present

## 2016-08-30 DIAGNOSIS — I5022 Chronic systolic (congestive) heart failure: Secondary | ICD-10-CM | POA: Diagnosis not present

## 2016-08-30 DIAGNOSIS — I2510 Atherosclerotic heart disease of native coronary artery without angina pectoris: Secondary | ICD-10-CM | POA: Diagnosis not present

## 2016-08-30 DIAGNOSIS — I2699 Other pulmonary embolism without acute cor pulmonale: Secondary | ICD-10-CM | POA: Diagnosis not present

## 2016-09-05 DIAGNOSIS — I2510 Atherosclerotic heart disease of native coronary artery without angina pectoris: Secondary | ICD-10-CM | POA: Diagnosis not present

## 2016-09-05 DIAGNOSIS — I248 Other forms of acute ischemic heart disease: Secondary | ICD-10-CM | POA: Diagnosis not present

## 2016-09-05 DIAGNOSIS — I471 Supraventricular tachycardia: Secondary | ICD-10-CM | POA: Diagnosis not present

## 2016-09-05 DIAGNOSIS — I5022 Chronic systolic (congestive) heart failure: Secondary | ICD-10-CM | POA: Diagnosis not present

## 2016-09-05 DIAGNOSIS — I429 Cardiomyopathy, unspecified: Secondary | ICD-10-CM | POA: Diagnosis not present

## 2016-09-05 DIAGNOSIS — I2699 Other pulmonary embolism without acute cor pulmonale: Secondary | ICD-10-CM | POA: Diagnosis not present

## 2016-09-12 ENCOUNTER — Encounter: Payer: Self-pay | Admitting: Adult Health

## 2016-09-12 ENCOUNTER — Ambulatory Visit (INDEPENDENT_AMBULATORY_CARE_PROVIDER_SITE_OTHER): Payer: Medicare Other | Admitting: Adult Health

## 2016-09-12 VITALS — BP 108/60 | HR 59 | Ht 63.0 in | Wt 131.0 lb

## 2016-09-12 DIAGNOSIS — I517 Cardiomegaly: Secondary | ICD-10-CM

## 2016-09-12 DIAGNOSIS — I5022 Chronic systolic (congestive) heart failure: Secondary | ICD-10-CM | POA: Diagnosis not present

## 2016-09-12 DIAGNOSIS — I480 Paroxysmal atrial fibrillation: Secondary | ICD-10-CM

## 2016-09-12 DIAGNOSIS — I248 Other forms of acute ischemic heart disease: Secondary | ICD-10-CM

## 2016-09-12 NOTE — Progress Notes (Signed)
Name: Miranda Ford    DOB: [birth year]  Age: 82 y.o.  MR#: [id number]       PCP:  Purvis Kilts, MD      Insurance: Payor: MEDICARE / Plan: MEDICARE PART A AND B / Product Type: *No Product type* /   CC:   No chief complaint on file.   VS Vitals:   09/12/16 1312  BP: 108/60  Pulse: (!) 59  SpO2: 91%  Weight: 131 lb (59.4 kg)  Height: 5\' 3"  (1.6 m)    Weights Current Weight  09/12/16 131 lb (59.4 kg)  08/02/16 134 lb 12.8 oz (61.1 kg)  08/01/16 133 lb (60.3 kg)    Blood Pressure  BP Readings from Last 3 Encounters:  09/12/16 108/60  08/02/16 100/70  08/01/16 (!) 98/50     Admit date:  (Not on file) Last encounter with RMR:  Visit date not found   Allergy Codeine and Penicillins  Current Outpatient Prescriptions  Medication Sig Dispense Refill  . amiodarone (PACERONE) 200 MG tablet Take 1 tablet (200 mg total) by mouth daily. 30 tablet 0  . apixaban (ELIQUIS) 5 MG TABS tablet Take 1 tablet (5 mg total) by mouth 2 (two) times daily. 60 tablet 0  . atorvastatin (LIPITOR) 80 MG tablet Take 1 tablet (80 mg total) by mouth daily at 6 PM. 30 tablet 0  . calcium carbonate (OSCAL) 1500 (600 Ca) MG TABS tablet Take 1,500 mg by mouth 2 (two) times daily with a meal.    . carvedilol (COREG) 3.125 MG tablet Take 1 tablet (3.125 mg total) by mouth 2 (two) times daily with a meal. 60 tablet 0  . furosemide (LASIX) 40 MG tablet Take 40 mg by mouth daily.    Marland Kitchen losartan (COZAAR) 25 MG tablet Take 0.5 tablets (12.5 mg total) by mouth daily. 30 tablet 0  . Multiple Vitamins-Minerals (ICAPS PO) Take 1 capsule by mouth 2 (two) times daily.    Marland Kitchen spironolactone (ALDACTONE) 25 MG tablet Take 0.5 tablets (12.5 mg total) by mouth daily. 30 tablet 0   No current facility-administered medications for this visit.     Discontinued Meds:    Medications Discontinued During This Encounter  Medication Reason  . OXYGEN Error    Patient Active Problem List   Diagnosis Date Noted  . Senile  osteoporosis 07/18/2016  . DNR (do not resuscitate) discussion   . Palliative care encounter   . Epistaxis 07/07/2016  . Acute systolic CHF (congestive heart failure) ([place]) 07/05/2016  . Acute respiratory failure with hypoxia ([place]) 07/05/2016  . Secondary cardiomyopathy ([place])   . Biventricular failure ([place])   . Atrial tachycardia ([place])   . Paroxysmal atrial tachycardia ([place])   . Demand ischemia ([place])   . Acute pulmonary embolism ([place]) 07/03/2016  . COPD exacerbation ([place]) 07/03/2016  . Cardiomegaly 07/03/2016  . Coronary atherosclerosis of native coronary artery 07/03/2016  . Elevated troponin     LABS    Component Value Date/Time   NA 134 (L) 08/01/2016 1430   NA 137 07/17/2016 0615   NA 137 07/14/2016 0453   K 4.3 08/01/2016 1430   K 3.9 07/17/2016 0615   K 3.9 07/14/2016 0453   CL 94 (L) 08/01/2016 1430   CL 97 (L) 07/17/2016 0615   CL 95 (L) 07/14/2016 0453   CO2 33 (H) 08/01/2016 1430   CO2 32 07/17/2016 0615   CO2 33 (H) 07/14/2016 0453   GLUCOSE 160 (H) 08/01/2016 1430  GLUCOSE 94 07/17/2016 0615   GLUCOSE 93 07/14/2016 0453   BUN 29 (H) 08/01/2016 1430   BUN 23 (H) 07/17/2016 0615   BUN 21 (H) 07/14/2016 0453   CREATININE 1.14 (H) 08/01/2016 1430   CREATININE 1.04 (H) 07/17/2016 0615   CREATININE 0.92 07/14/2016 0453   CALCIUM 8.6 (L) 08/01/2016 1430   CALCIUM 8.7 (L) 07/17/2016 0615   CALCIUM 8.8 (L) 07/14/2016 0453   GFRNONAA 41 (L) 08/01/2016 1430   GFRNONAA 46 (L) 07/17/2016 0615   GFRNONAA 54 (L) 07/14/2016 0453   GFRAA 48 (L) 08/01/2016 1430   GFRAA 54 (L) 07/17/2016 0615   GFRAA >60 07/14/2016 0453   CMP     Component Value Date/Time   NA 134 (L) 08/01/2016 1430   K 4.3 08/01/2016 1430   CL 94 (L) 08/01/2016 1430   CO2 33 (H) 08/01/2016 1430   GLUCOSE 160 (H) 08/01/2016 1430   BUN 29 (H) 08/01/2016 1430   CREATININE 1.14 (H) 08/01/2016 1430   CALCIUM 8.6 (L) 08/01/2016 1430   PROT 5.5 (L) 07/04/2016 0443   ALBUMIN 3.1 (L) 07/04/2016 0443    AST 33 07/04/2016 0443   ALT 32 07/04/2016 0443   ALKPHOS 93 07/04/2016 0443   BILITOT 0.8 07/04/2016 0443   GFRNONAA 41 (L) 08/01/2016 1430   GFRAA 48 (L) 08/01/2016 1430       Component Value Date/Time   WBC 7.4 08/01/2016 1430   WBC 6.6 07/25/2016 0734   WBC 7.6 07/17/2016 0615   HGB 13.1 08/01/2016 1430   HGB 12.8 07/25/2016 0734   HGB 13.1 07/17/2016 0615   HCT 40.5 08/01/2016 1430   HCT 40.3 07/25/2016 0734   HCT 40.1 07/17/2016 0615   MCV 94.2 08/01/2016 1430   MCV 94.2 07/25/2016 0734   MCV 93.5 07/17/2016 0615    Lipid Panel     Component Value Date/Time   CHOL 141 07/04/2016 0443   TRIG 41 07/04/2016 0443   HDL 56 07/04/2016 0443   CHOLHDL 2.5 07/04/2016 0443   VLDL 8 07/04/2016 0443   LDLCALC 77 07/04/2016 0443    ABG No results found for: PHART, PCO2ART, PO2ART, HCO3, TCO2, ACIDBASEDEF, O2SAT   Lab Results  Component Value Date   TSH 2.258 07/03/2016   BNP (last 3 results)  Recent Labs  07/03/16 1145  BNP 737.0*    ProBNP (last 3 results) No results for input(s): PROBNP in the last 8760 hours.  Cardiac Panel (last 3 results) No results for input(s): CKTOTAL, CKMB, TROPONINI, RELINDX in the last 72 hours.  Iron/TIBC/Ferritin/ %Sat No results found for: IRON, TIBC, FERRITIN, IRONPCTSAT   EKG Orders placed or performed in visit on 08/01/16  . EKG 12-Lead     Prior Assessment and Plan Problem List as of 09/12/2016 Reviewed: 08/14/2016  5:38 PM by Granville Lewis, PA-C     Cardiovascular and Mediastinum   Acute pulmonary embolism [facility])   Last Assessment & Plan 07/18/2016 Nursing Home Written 07/18/2016 11:20 AM by Hendricks Limes, MD    Stable on Eliquis      Cardiomegaly   Coronary atherosclerosis of native coronary artery   Paroxysmal atrial tachycardia [facility])   Last Assessment & Plan 07/18/2016 Nursing Home Written 07/18/2016 11:21 AM by Hendricks Limes, MD    Rate controlled      Demand ischemia [facility])   Acute systolic CHF (congestive  heart failure) [facility])   Last Assessment & Plan 07/18/2016 Nursing Home Written 07/18/2016 11:21 AM by Gwyndolyn Saxon  Chanda Busing, MD    Clinically resolved No change in medications is  indicated       Secondary cardiomyopathy [facility])   Biventricular failure ([place])   Atrial tachycardia ([place])     Respiratory   COPD exacerbation ([place])   Acute respiratory failure with hypoxia [facility])   Epistaxis   Last Assessment & Plan 07/18/2016 Nursing Home Written 07/18/2016 11:23 AM by Hendricks Limes, MD    Verify F/U appointment with Dr Johnnette Gourd, to remove Rhinorocket        Musculoskeletal and Integument   Senile osteoporosis   Last Assessment & Plan 07/18/2016 Nursing Home Written 07/18/2016 11:25 AM by Hendricks Limes, MD    D/C Alendronate        Other   Elevated troponin   Palliative care encounter   Last Assessment & Plan 07/18/2016 Nursing Home Written 07/18/2016 11:24 AM by Hendricks Limes, MD    DNR      DNR (do not resuscitate) discussion       Imaging: No results found.

## 2016-09-12 NOTE — Patient Instructions (Signed)
Your physician wants you to follow-up in: 6 Months with Dr. Harrington Challenger. You will receive a reminder letter in the mail two months in advance. If you don't receive a letter, please call our office to schedule the follow-up appointment.  Your physician recommends that you continue on your current medications as directed. Please refer to the Current Medication list given to you today.  Your physician has requested that you have an echocardiogram. Echocardiography is a painless test that uses sound waves to create images of your heart. It provides your doctor with information about the size and shape of your heart and how well your heart's chambers and valves are working. This procedure takes approximately one hour. There are no restrictions for this procedure.  You have been given samples of Eliquis today.  If you need a refill on your cardiac medications before your next appointment, please call your pharmacy.  Thank you for choosing [facility]!

## 2016-09-12 NOTE — Progress Notes (Signed)
Cardiology Office Note   Date:  09/12/2016   ID:  [name], DOB [birth year], MRN [id number]  PCP:  Purvis Kilts, MD  Cardiologist: Ross/ Jory Sims, NP   Chief Complaint  Patient presents with  . Congestive Heart Failure  . Atrial Fibrillation      History of Present Illness: ASHIA MUSICO is a 82 y.o. female who presents for ongoing assessment and management of systolic heart failure, history of acute PE with evidence of right heart strain. Patient has a history of systolic dysfunction with an EF of 20%. History of PAT. She was placed on heparin but had significant epistaxis requiring nasal packing. She followed up in the office on 08/01/2016. She was continued on conservative management. She was also continued on ELIQUIS due to history of PE. She remained on amiodarone and was in normal sinus rhythm. Lasix was discontinued. The patient remains a resident of [facility].   She comes today feeling much better. She's had no further complaints of bleeding from her nares. She is having trouble obtaining anticoagulation therapy from insurance company. Despite paroxysmal atrial fibrillation. This will be corrected. Otherwise she is without complaints has returned home from skilled nursing facility and now has home health and physical therapy in home. She is beginning to get her energy back and regain appetite.  Past Medical History:  Diagnosis Date  . Cataracts, bilateral   . Chronic systolic CHF (congestive heart failure) ([place])    a. dx 06/2016 - conservative rx recommended. EF 20% with biventricular failure in setting of PE.  . Demand ischemia ([place])   . Epistaxis   . Hyponatremia   . Hyponatremia   . Macular degeneration    dry OD;wet OS. S/P injections  . Osteoporosis    07/18/16 previously on Premarin; S/P > 10 years Alendronate  . Paroxysmal atrial tachycardia ([place])    a. placed on amiodarone 06/2016.  . Pulmonary emboli ([place]) 06/2016    Past Surgical History:   Procedure Laterality Date  . CATARACT EXTRACTION     bilaterally  . CHOLECYSTECTOMY    . TONSILLECTOMY       Current Outpatient Prescriptions  Medication Sig Dispense Refill  . amiodarone (PACERONE) 200 MG tablet Take 1 tablet (200 mg total) by mouth daily. 30 tablet 0  . apixaban (ELIQUIS) 5 MG TABS tablet Take 1 tablet (5 mg total) by mouth 2 (two) times daily. 60 tablet 0  . atorvastatin (LIPITOR) 80 MG tablet Take 1 tablet (80 mg total) by mouth daily at 6 PM. 30 tablet 0  . calcium carbonate (OSCAL) 1500 (600 Ca) MG TABS tablet Take 1,500 mg by mouth 2 (two) times daily with a meal.    . carvedilol (COREG) 3.125 MG tablet Take 1 tablet (3.125 mg total) by mouth 2 (two) times daily with a meal. 60 tablet 0  . furosemide (LASIX) 40 MG tablet Take 40 mg by mouth daily.    Marland Kitchen losartan (COZAAR) 25 MG tablet Take 0.5 tablets (12.5 mg total) by mouth daily. 30 tablet 0  . Multiple Vitamins-Minerals (ICAPS PO) Take 1 capsule by mouth 2 (two) times daily.    Marland Kitchen spironolactone (ALDACTONE) 25 MG tablet Take 0.5 tablets (12.5 mg total) by mouth daily. 30 tablet 0   No current facility-administered medications for this visit.     Allergies:   Codeine and Penicillins    Social History:  The patient  reports that she quit smoking about 42 years ago. Her smoking use  included Cigarettes. She has never used smokeless tobacco. She reports that she does not drink alcohol or use drugs.   Family History:  The patient's family history includes Cancer in her sister; Diabetes in her father; Heart disease in her mother; Stroke in her mother.    ROS: All other systems are reviewed and negative. Unless otherwise mentioned in H&P    PHYSICAL EXAM: VS:  BP 108/60   Pulse (!) 59   Ht 5\' 3"  (1.6 m)   Wt 131 lb (59.4 kg)   SpO2 91%   BMI 23.21 kg/m  , BMI Body mass index is 23.21 kg/m. GEN: Well nourished, well developed, in no acute distress  HEENT: normal  Neck: no JVD, carotid bruits, or  masses Cardiac: RRR; no murmurs, rubs, or gallops,no edema  Respiratory:  clear to auscultation bilaterally, normal work of breathing GI: soft, nontender, nondistended, + BS MS: no deformity or atrophy  Skin: warm and dry, no rash Neuro:  Strength and sensation are intact Psych: euthymic mood, full affect   Recent Labs: 07/03/2016: B Natriuretic Peptide 737.0; Magnesium 1.9; TSH 2.258 07/04/2016: ALT 32 08/01/2016: BUN 29; Creatinine, Ser 1.14; Hemoglobin 13.1; Platelets 191; Potassium 4.3; Sodium 134    Lipid Panel    Component Value Date/Time   CHOL 141 07/04/2016 0443   TRIG 41 07/04/2016 0443   HDL 56 07/04/2016 0443   CHOLHDL 2.5 07/04/2016 0443   VLDL 8 07/04/2016 0443   LDLCALC 77 07/04/2016 0443      Wt Readings from Last 3 Encounters:  09/12/16 131 lb (59.4 kg)  08/02/16 134 lb 12.8 oz (61.1 kg)  08/01/16 133 lb (60.3 kg)     ASSESSMENT AND PLAN:  1.  Paroxysmal atrial fibrillation: Remains in normal sinus rhythm. We'll continue ELIQUIS for another 6 months. CHADS VASC Score 5. Heart rate is controlled on amiodarone and carvedilol. No changes in her regimen at this time.  2. Chronic systolic heart failure: Most recent ejection fraction was evaluated in July 2017 revealing EF of 20%. We'll repeat echocardiogram for reevaluation of LV function on current medications. She has been taken off of Lasix due to dehydration but will remain on spironolactone, ARB and BB. Consider change to [facility] if systolic function remains significantly diminished.  3. Epistaxis: Completely resolved. Thought to be related to heparin during hospitalization.   Current medicines are reviewed at length with the patient today.    Labs/ tests ordered today include: Echocardiogram No orders of the defined types were placed in this encounter.    Disposition:   FU with 6 months Signed, Jory Sims, NP  09/12/2016 1:29 PM    [facility] [address],  [place], [place] [address] Phone: [phone]; Fax: [phone]

## 2016-09-19 ENCOUNTER — Ambulatory Visit (HOSPITAL_COMMUNITY)
Admission: RE | Admit: 2016-09-19 | Discharge: 2016-09-19 | Disposition: A | Discharge status: Home / Self Care | Payer: Medicare Other | Source: Ambulatory Visit | Attending: Adult Health | Admitting: Adult Health

## 2016-09-19 DIAGNOSIS — I517 Cardiomegaly: Secondary | ICD-10-CM | POA: Insufficient documentation

## 2016-09-19 DIAGNOSIS — J449 Chronic obstructive pulmonary disease, unspecified: Secondary | ICD-10-CM | POA: Insufficient documentation

## 2016-09-19 DIAGNOSIS — I471 Supraventricular tachycardia: Secondary | ICD-10-CM | POA: Insufficient documentation

## 2016-09-19 DIAGNOSIS — I351 Nonrheumatic aortic (valve) insufficiency: Secondary | ICD-10-CM | POA: Insufficient documentation

## 2016-09-19 DIAGNOSIS — I071 Rheumatic tricuspid insufficiency: Secondary | ICD-10-CM | POA: Diagnosis not present

## 2016-09-19 DIAGNOSIS — I340 Nonrheumatic mitral (valve) insufficiency: Secondary | ICD-10-CM | POA: Diagnosis not present

## 2016-09-19 LAB — ECHOCARDIOGRAM COMPLETE
AOPV: 0.57 m/s
AV Area VTI index: 0.86 cm2/m2
AV Area VTI: 1.46 cm2
AV Area mean vel: 1.58 cm2
AV Mean grad: 7 mmHg
AV VEL mean LVOT/AV: 0.62
AV area mean vel ind: 0.97 cm2/m2
AV peak Index: 0.89
AV pk vel: 195 cm/s
AV vel: 1.41
AVA: 1.41 cm2
AVLVOTPG: 5 mmHg
AVPG: 15 mmHg
CHL CUP AV VALUE AREA INDEX: 0.86
CHL CUP DOP CALC LVOT VTI: 26.8 cm
CHL CUP MV DEC (S): 190
CHL CUP RV SYS PRESS: 32 mmHg
CHL CUP TV REG PEAK VELOCITY: 270 cm/s
DOP CAL AO MEAN VELOCITY: 121 cm/s
EERAT: 18.9
EWDT: 190 ms
FS: 18 % — AB (ref 28–44)
IVS/LV PW RATIO, ED: 0.79
LA ID, A-P, ES: 39 mm
LA diam end sys: 39 mm
LA diam index: 2.39 cm/m2
LA vol A4C: 76.3 ml
LA vol: 66.5 mL
LAVOLIN: 40.7 mL/m2
LDCA: 2.54 cm2
LV PW d: 11.2 mm — AB (ref 0.6–1.1)
LV TDI E'MEDIAL: 3.53
LV dias vol: 75 mL (ref 46–106)
LV sys vol index: 29 mL/m2
LVDIAVOLIN: 46 mL/m2
LVEEAVG: 18.9
LVEEMED: 18.9
LVELAT: 4.8 cm/s
LVOT SV: 68 mL
LVOT peak VTI: 0.55 cm
LVOT peak vel: 112 cm/s
LVOTD: 18 mm
LVSYSVOL: 48 mL — AB (ref 14–42)
Lateral S' vel: 13.2 cm/s
MV pk A vel: 118 m/s
MVPG: 3 mmHg
MVPKEVEL: 90.7 m/s
P 1/2 time: 658 ms
RV TAPSE: 23.8 mm
Simpson's disk: 36
Stroke v: 27 ml
TDI e' lateral: 4.8
TR max vel: 270 cm/s
VTI: 209 cm
VTI: 48.3 cm

## 2016-09-19 NOTE — Progress Notes (Signed)
*  PRELIMINARY RESULTS* Echocardiogram 2D Echocardiogram has been performed.  Miranda Ford 09/19/2016, 2:59 PM

## 2016-09-20 DIAGNOSIS — I5022 Chronic systolic (congestive) heart failure: Secondary | ICD-10-CM | POA: Diagnosis not present

## 2016-09-20 DIAGNOSIS — I2510 Atherosclerotic heart disease of native coronary artery without angina pectoris: Secondary | ICD-10-CM | POA: Diagnosis not present

## 2016-09-20 DIAGNOSIS — I2699 Other pulmonary embolism without acute cor pulmonale: Secondary | ICD-10-CM | POA: Diagnosis not present

## 2016-09-20 DIAGNOSIS — I471 Supraventricular tachycardia: Secondary | ICD-10-CM | POA: Diagnosis not present

## 2016-09-20 DIAGNOSIS — I429 Cardiomyopathy, unspecified: Secondary | ICD-10-CM | POA: Diagnosis not present

## 2016-09-20 DIAGNOSIS — I248 Other forms of acute ischemic heart disease: Secondary | ICD-10-CM | POA: Diagnosis not present

## 2016-11-08 DIAGNOSIS — Z23 Encounter for immunization: Secondary | ICD-10-CM | POA: Diagnosis not present

## 2016-12-27 DIAGNOSIS — H353222 Exudative age-related macular degeneration, left eye, with inactive choroidal neovascularization: Secondary | ICD-10-CM | POA: Diagnosis not present

## 2016-12-27 DIAGNOSIS — H353113 Nonexudative age-related macular degeneration, right eye, advanced atrophic without subfoveal involvement: Secondary | ICD-10-CM | POA: Diagnosis not present

## 2016-12-27 DIAGNOSIS — H353124 Nonexudative age-related macular degeneration, left eye, advanced atrophic with subfoveal involvement: Secondary | ICD-10-CM | POA: Diagnosis not present

## 2016-12-27 DIAGNOSIS — H35361 Drusen (degenerative) of macula, right eye: Secondary | ICD-10-CM | POA: Diagnosis not present

## 2017-01-15 ENCOUNTER — Other Ambulatory Visit (HOSPITAL_COMMUNITY): Payer: Self-pay | Admitting: Family Medicine

## 2017-01-15 DIAGNOSIS — Z1231 Encounter for screening mammogram for malignant neoplasm of breast: Secondary | ICD-10-CM

## 2017-01-17 ENCOUNTER — Ambulatory Visit (HOSPITAL_COMMUNITY): Payer: Medicare Other

## 2017-01-24 ENCOUNTER — Ambulatory Visit (HOSPITAL_COMMUNITY)
Admission: RE | Admit: 2017-01-24 | Discharge: 2017-01-24 | Disposition: A | Discharge status: Home / Self Care | Payer: Medicare Other | Source: Ambulatory Visit | Attending: Family Medicine | Admitting: Family Medicine

## 2017-01-24 ENCOUNTER — Other Ambulatory Visit: Payer: Self-pay | Admitting: Obstetrics and Gynecology

## 2017-01-24 DIAGNOSIS — Z1231 Encounter for screening mammogram for malignant neoplasm of breast: Secondary | ICD-10-CM | POA: Diagnosis present

## 2017-03-12 ENCOUNTER — Ambulatory Visit: Payer: Medicare Other | Admitting: Adult Health

## 2017-03-25 NOTE — Progress Notes (Signed)
Cardiology Office Note   Date:  03/26/2017   ID:  Miranda Ford, DOB 01-26-1924, MRN [id number]  PCP:  Purvis Kilts, MD  Cardiologist: Ross/  Jory Sims, NP   Chief Complaint  Patient presents with  . Cardiomyopathy   History of Present Illness: Miranda Ford is a 83 y.o. female who presents for ongoing assessment and management of systolic heart failure, history of acute PE with evidence of right heart strain. Patient has a history of systolic dysfunction with an EF of 20%. History of PAT. She remains on Eliquis.  She is a resident of [facility]. She was last seen in the office 09/12/2016. Repeat echocardiogram was completed due to significantly reduced EF on 20% on last echo.   Echocardiogram 09/19/2016 Left ventricle: The cavity size was mildly dilated. Wall   thickness was normal. The estimated ejection fraction was 25%.   Diffuse hypokinesis. Doppler parameters are consistent with   abnormal left ventricular relaxation (grade 1 diastolic   dysfunction). Doppler parameters are consistent with high   ventricular filling pressure. - Aortic valve: Probably mild to moderate aortic stenosis vs   pseudo-stenosis due to reduced left ventricular output.   Moderately thickened, moderately calcified leaflets. There was   mild regurgitation. Peak velocity (S): 195 cm/s. Mean gradient   (S): 7 mm Hg. Valve area (VTI): 1.41 cm^2. Valve area (Vmax):   1.46 cm^2. Valve area (Vmean): 1.58 cm^2. - Aorta: Aortic root at upper normal limits in diameter. Aortic   root dimension: 39 mm (ED). - Mitral valve: Calcified annulus. Mildly thickened, mildly   calcified leaflets . There was mild regurgitation. - Left atrium: The atrium was moderately dilated. - Right ventricle: Systolic function was mildly reduced. - Tricuspid valve: There was moderate regurgitation. - Pulmonary arteries: PA peak pressure: 44 mm Hg (S). - Inferior vena cava: The vessel was severely dilated. The  respirophasic diameter changes were blunted (< 50%), consistent   with elevated central venous pressure.  She is without complaints today. She is at home, and is able to clean her house, go to grocery store, drive, complete ADL's. She denies palpitations, near syncope, dyspnea, chest pain or fatigue. She has had no edema. She denies bleeding on Eliquis. .   Past Medical History:  Diagnosis Date  . Cataracts, bilateral   . Chronic systolic CHF (congestive heart failure) ([place])    a. dx 06/2016 - conservative rx recommended. EF 20% with biventricular failure in setting of PE.  . Demand ischemia ([place])   . Epistaxis   . Hyponatremia   . Hyponatremia   . Macular degeneration    dry OD;wet OS. S/P injections  . Osteoporosis    07/18/16 previously on Premarin; S/P > 10 years Alendronate  . Paroxysmal atrial tachycardia ([place])    a. placed on amiodarone 06/2016.  . Pulmonary emboli ([place]) 06/2016    Past Surgical History:  Procedure Laterality Date  . CATARACT EXTRACTION     bilaterally  . CHOLECYSTECTOMY    . TONSILLECTOMY       Current Outpatient Prescriptions  Medication Sig Dispense Refill  . amiodarone (PACERONE) 200 MG tablet Take 1 tablet (200 mg total) by mouth daily. 30 tablet 0  . apixaban (ELIQUIS) 5 MG TABS tablet Take 1 tablet (5 mg total) by mouth 2 (two) times daily. 60 tablet 0  . atorvastatin (LIPITOR) 80 MG tablet Take 1 tablet (80 mg total) by mouth daily at 6 PM. 30 tablet 0  . calcium carbonate (  OSCAL) 1500 (600 Ca) MG TABS tablet Take 1,500 mg by mouth 2 (two) times daily with a meal.    . carvedilol (COREG) 3.125 MG tablet Take 1 tablet (3.125 mg total) by mouth 2 (two) times daily with a meal. 60 tablet 6  . losartan (COZAAR) 25 MG tablet Take 0.5 tablets (12.5 mg total) by mouth daily. 30 tablet 0  . Multiple Vitamins-Minerals (ICAPS PO) Take 1 capsule by mouth 2 (two) times daily.    Marland Kitchen spironolactone (ALDACTONE) 25 MG tablet Take 0.5 tablets (12.5 mg total) by  mouth daily. 30 tablet 0   No current facility-administered medications for this visit.     Allergies:   Codeine and Penicillins    Social History:  The patient  reports that she quit smoking about 43 years ago. Her smoking use included Cigarettes. She has never used smokeless tobacco. She reports that she does not drink alcohol or use drugs.   Family History:  The patient's family history includes Cancer in her sister; Diabetes in her father; Heart disease in her mother; Stroke in her mother.    ROS: All other systems are reviewed and negative. Unless otherwise mentioned in H&P    PHYSICAL EXAM: VS:  BP (!) 104/58   Pulse 60   Ht 5\' 5"  (1.651 m)   Wt 132 lb (59.9 kg)   SpO2 90%   BMI 21.97 kg/m  , BMI Body mass index is 21.97 kg/m. GEN: Well nourished, well developed, in no acute distress  HEENT: normal  Neck: no JVD, carotid bruits, or masses Cardiac: RRR; 2/6 holosystolic murmur,  rubs, or gallops,no edema  Respiratory:  clear to auscultation bilaterally, normal work of breathing GI: soft, nontender, nondistended, + BS MS: no deformity or atrophy  Skin: warm and dry, no rash Neuro:  Strength and sensation are intact Psych: euthymic mood, full affect   Recent Labs: 07/03/2016: B Natriuretic Peptide 737.0; Magnesium 1.9; TSH 2.258 07/04/2016: ALT 32 08/01/2016: BUN 29; Creatinine, Ser 1.14; Hemoglobin 13.1; Platelets 191; Potassium 4.3; Sodium 134    Lipid Panel    Component Value Date/Time   CHOL 141 07/04/2016 0443   TRIG 41 07/04/2016 0443   HDL 56 07/04/2016 0443   CHOLHDL 2.5 07/04/2016 0443   VLDL 8 07/04/2016 0443   LDLCALC 77 07/04/2016 0443      Wt Readings from Last 3 Encounters:  03/26/17 132 lb (59.9 kg)  09/12/16 131 lb (59.4 kg)  08/02/16 134 lb 12.8 oz (61.1 kg)       ASSESSMENT AND PLAN:  1. Nonischemic Cardiomyopathy with reduced EF: Recent echo Sept of 2017 revealed no changes in EF at 25%. She remains on coreg, spironolactone, ARB. BP  is stable. She is completely asymptomatic. Will check labs to include BMET, CBC and amiodarone level. She will need to be established with a local cardiologist. She has not seen Dr. Harrington Challenger since she was seen initially on consult in the hospital. ICD has not been discussed with her in the past. She is currently asymptomatic and therefore will not refer to EP at this time.   2. Paroxsymal atrial tachycardia: Remains on amiodarone with no recurrent symptoms. Checking level to avoid toxicity in this elderly lady.  3. Biventricular CHF: She has no evidence of decompensation or symptoms. She is not on diuretic at this time. Weight has been stable since August of 2017. May refer to Advanced CHF clinic at some point but she is stable currently.   4. Acute PE:  Continues on Eliquis.   Current medicines are reviewed at length with the patient today.    Labs/ tests ordered today include:   Orders Placed This Encounter  Procedures  . CBC  . Basic Metabolic Panel (BMET)  . Amiodarone level     Disposition:   FU with 6 months to establish with local cardiologist.   Signed, Jory Sims, NP  03/26/2017 3:54 PM    [facility] [address], [place], [place] [address] Phone: [phone]; Fax: [phone]

## 2017-03-26 ENCOUNTER — Encounter: Payer: Self-pay | Admitting: Adult Health

## 2017-03-26 ENCOUNTER — Ambulatory Visit (INDEPENDENT_AMBULATORY_CARE_PROVIDER_SITE_OTHER): Payer: Medicare Other | Admitting: Adult Health

## 2017-03-26 VITALS — BP 104/58 | HR 60 | Ht 65.0 in | Wt 132.0 lb

## 2017-03-26 DIAGNOSIS — I517 Cardiomegaly: Secondary | ICD-10-CM

## 2017-03-26 DIAGNOSIS — Z86711 Personal history of pulmonary embolism: Secondary | ICD-10-CM

## 2017-03-26 DIAGNOSIS — I471 Supraventricular tachycardia: Secondary | ICD-10-CM | POA: Diagnosis not present

## 2017-03-26 DIAGNOSIS — Z79899 Other long term (current) drug therapy: Secondary | ICD-10-CM

## 2017-03-26 MED ORDER — CARVEDILOL 3.125 MG PO TABS: 3.1250 mg | ORAL_TABLET | Freq: Two times a day (BID) | ORAL | 6 refills | Status: DC

## 2017-03-26 NOTE — Progress Notes (Signed)
Name: Miranda Ford    DOB: 1924-02-27  Age: 83 y.o.  MR#: [id number]       PCP:  Purvis Kilts, MD      Insurance: Payor: MEDICARE / Plan: MEDICARE PART A AND B / Product Type: *No Product type* /   CC:   No chief complaint on file.   VS Vitals:   03/26/17 1513  BP: (!) 104/58  Pulse: 60  SpO2: 90%  Weight: 132 lb (59.9 kg)  Height: 5\' 5"  (1.651 m)    Weights Current Weight  03/26/17 132 lb (59.9 kg)  09/12/16 131 lb (59.4 kg)  08/02/16 134 lb 12.8 oz (61.1 kg)    Blood Pressure  BP Readings from Last 3 Encounters:  03/26/17 (!) 104/58  09/12/16 108/60  08/02/16 100/70     Admit date:  (Not on file) Last encounter with RMR:  Visit date not found   Allergy Codeine and Penicillins  Current Outpatient Prescriptions  Medication Sig Dispense Refill  . amiodarone (PACERONE) 200 MG tablet Take 1 tablet (200 mg total) by mouth daily. 30 tablet 0  . apixaban (ELIQUIS) 5 MG TABS tablet Take 1 tablet (5 mg total) by mouth 2 (two) times daily. 60 tablet 0  . atorvastatin (LIPITOR) 80 MG tablet Take 1 tablet (80 mg total) by mouth daily at 6 PM. 30 tablet 0  . calcium carbonate (OSCAL) 1500 (600 Ca) MG TABS tablet Take 1,500 mg by mouth 2 (two) times daily with a meal.    . carvedilol (COREG) 3.125 MG tablet Take 1 tablet (3.125 mg total) by mouth 2 (two) times daily with a meal. 60 tablet 0  . losartan (COZAAR) 25 MG tablet Take 0.5 tablets (12.5 mg total) by mouth daily. 30 tablet 0  . Multiple Vitamins-Minerals (ICAPS PO) Take 1 capsule by mouth 2 (two) times daily.    Marland Kitchen spironolactone (ALDACTONE) 25 MG tablet Take 0.5 tablets (12.5 mg total) by mouth daily. 30 tablet 0   No current facility-administered medications for this visit.     Discontinued Meds:    Medications Discontinued During This Encounter  Medication Reason  . furosemide (LASIX) 40 MG tablet Error    Patient Active Problem List   Diagnosis Date Noted  . Senile osteoporosis 07/18/2016  . DNR (do  not resuscitate) discussion   . Palliative care encounter   . Epistaxis 07/07/2016  . Acute systolic CHF (congestive heart failure) ([place]) 07/05/2016  . Acute respiratory failure with hypoxia ([place]) 07/05/2016  . Secondary cardiomyopathy ([place])   . Biventricular failure   . Atrial tachycardia ([place])   . Paroxysmal atrial tachycardia ([place])   . Demand ischemia ([place])   . Acute pulmonary embolism ([place]) 07/03/2016  . COPD exacerbation ([place]) 07/03/2016  . Cardiomegaly 07/03/2016  . Coronary atherosclerosis of native coronary artery 07/03/2016  . Elevated troponin     LABS    Component Value Date/Time   NA 134 (L) 08/01/2016 1430   NA 137 07/17/2016 0615   NA 137 07/14/2016 0453   K 4.3 08/01/2016 1430   K 3.9 07/17/2016 0615   K 3.9 07/14/2016 0453   CL 94 (L) 08/01/2016 1430   CL 97 (L) 07/17/2016 0615   CL 95 (L) 07/14/2016 0453   CO2 33 (H) 08/01/2016 1430   CO2 32 07/17/2016 0615   CO2 33 (H) 07/14/2016 0453   GLUCOSE 160 (H) 08/01/2016 1430   GLUCOSE 94 07/17/2016 0615   GLUCOSE 93 07/14/2016 0453  BUN 29 (H) 08/01/2016 1430   BUN 23 (H) 07/17/2016 0615   BUN 21 (H) 07/14/2016 0453   CREATININE 1.14 (H) 08/01/2016 1430   CREATININE 1.04 (H) 07/17/2016 0615   CREATININE 0.92 07/14/2016 0453   CALCIUM 8.6 (L) 08/01/2016 1430   CALCIUM 8.7 (L) 07/17/2016 0615   CALCIUM 8.8 (L) 07/14/2016 0453   GFRNONAA 41 (L) 08/01/2016 1430   GFRNONAA 46 (L) 07/17/2016 0615   GFRNONAA 54 (L) 07/14/2016 0453   GFRAA 48 (L) 08/01/2016 1430   GFRAA 54 (L) 07/17/2016 0615   GFRAA >60 07/14/2016 0453   CMP     Component Value Date/Time   NA 134 (L) 08/01/2016 1430   K 4.3 08/01/2016 1430   CL 94 (L) 08/01/2016 1430   CO2 33 (H) 08/01/2016 1430   GLUCOSE 160 (H) 08/01/2016 1430   BUN 29 (H) 08/01/2016 1430   CREATININE 1.14 (H) 08/01/2016 1430   CALCIUM 8.6 (L) 08/01/2016 1430   PROT 5.5 (L) 07/04/2016 0443   ALBUMIN 3.1 (L) 07/04/2016 0443   AST 33 07/04/2016 0443   ALT 32  07/04/2016 0443   ALKPHOS 93 07/04/2016 0443   BILITOT 0.8 07/04/2016 0443   GFRNONAA 41 (L) 08/01/2016 1430   GFRAA 48 (L) 08/01/2016 1430       Component Value Date/Time   WBC 7.4 08/01/2016 1430   WBC 6.6 07/25/2016 0734   WBC 7.6 07/17/2016 0615   HGB 13.1 08/01/2016 1430   HGB 12.8 07/25/2016 0734   HGB 13.1 07/17/2016 0615   HCT 40.5 08/01/2016 1430   HCT 40.3 07/25/2016 0734   HCT 40.1 07/17/2016 0615   MCV 94.2 08/01/2016 1430   MCV 94.2 07/25/2016 0734   MCV 93.5 07/17/2016 0615    Lipid Panel     Component Value Date/Time   CHOL 141 07/04/2016 0443   TRIG 41 07/04/2016 0443   HDL 56 07/04/2016 0443   CHOLHDL 2.5 07/04/2016 0443   VLDL 8 07/04/2016 0443   LDLCALC 77 07/04/2016 0443    ABG No results found for: PHART, PCO2ART, PO2ART, HCO3, TCO2, ACIDBASEDEF, O2SAT   Lab Results  Component Value Date   TSH 2.258 07/03/2016   BNP (last 3 results)  Recent Labs  07/03/16 1145  BNP 737.0*    ProBNP (last 3 results) No results for input(s): PROBNP in the last 8760 hours.  Cardiac Panel (last 3 results) No results for input(s): CKTOTAL, CKMB, TROPONINI, RELINDX in the last 72 hours.  Iron/TIBC/Ferritin/ %Sat No results found for: IRON, TIBC, FERRITIN, IRONPCTSAT   EKG Orders placed or performed in visit on 08/01/16  . EKG 12-Lead     Prior Assessment and Plan Problem List as of 03/26/2017 Reviewed: 09/12/2016  2:13 PM by Jory Sims, NP     Cardiovascular and Mediastinum   Acute pulmonary embolism [facility])   Last Assessment & Plan 07/18/2016 Nursing Home Written 07/18/2016 11:20 AM by Hendricks Limes, MD    Stable on Eliquis      Cardiomegaly   Coronary atherosclerosis of native coronary artery   Paroxysmal atrial tachycardia [facility])   Last Assessment & Plan 07/18/2016 Nursing Home Written 07/18/2016 11:21 AM by Hendricks Limes, MD    Rate controlled      Demand ischemia [facility])   Acute systolic CHF (congestive heart failure) [facility])   Last  Assessment & Plan 07/18/2016 Nursing Home Written 07/18/2016 11:21 AM by Hendricks Limes, MD    Clinically resolved No change in medications  is  indicated       Secondary cardiomyopathy ([place])   Biventricular failure   Atrial tachycardia ([place])     Respiratory   COPD exacerbation ([place])   Acute respiratory failure with hypoxia [facility])   Epistaxis   Last Assessment & Plan 07/18/2016 Nursing Home Written 07/18/2016 11:23 AM by Hendricks Limes, MD    Verify F/U appointment with Dr Johnnette Gourd, to remove Rhinorocket        Musculoskeletal and Integument   Senile osteoporosis   Last Assessment & Plan 07/18/2016 Nursing Home Written 07/18/2016 11:25 AM by Hendricks Limes, MD    D/C Alendronate        Other   Elevated troponin   Palliative care encounter   Last Assessment & Plan 07/18/2016 Nursing Home Written 07/18/2016 11:24 AM by Hendricks Limes, MD    DNR      DNR (do not resuscitate) discussion       Imaging: No results found.

## 2017-03-26 NOTE — Patient Instructions (Addendum)
Your physician wants you to follow-up in: 6 months with Doctor. You will receive a reminder letter in the mail two months in advance. If you don't receive a letter, please call our office to schedule the follow-up appointment.   Your physician recommends that you return for lab work in: Amiodarone level,bmet,cbc     Your physician recommends that you continue on your current medications as directed. Please refer to the Current Medication list given to you today.     Thank you for choosing [facility] !

## 2017-03-27 LAB — BASIC METABOLIC PANEL
BUN/Creatinine Ratio: 17 (ref 12–28)
BUN: 29 mg/dL (ref 10–36)
CHLORIDE: 96 mmol/L (ref 96–106)
CO2: 29 mmol/L (ref 18–29)
Calcium: 9 mg/dL (ref 8.7–10.3)
Creatinine, Ser: 1.72 mg/dL — ABNORMAL HIGH (ref 0.57–1.00)
GFR calc Af Amer: 30 mL/min/{1.73_m2} — ABNORMAL LOW (ref >59)
GFR calc non Af Amer: 26 mL/min/{1.73_m2} — ABNORMAL LOW (ref >59)
GLUCOSE: 73 mg/dL (ref 65–99)
POTASSIUM: 5.5 mmol/L — AB (ref 3.5–5.2)
SODIUM: 139 mmol/L (ref 134–144)

## 2017-03-27 LAB — CBC
Hematocrit: 39.8 % (ref 34.0–46.6)
Hemoglobin: 12.9 g/dL (ref 11.1–15.9)
MCH: 31.2 pg (ref 26.6–33.0)
MCHC: 32.4 g/dL (ref 31.5–35.7)
MCV: 96 fL (ref 79–97)
PLATELETS: 207 10*3/uL (ref 150–379)
RBC: 4.14 x10E6/uL (ref 3.77–5.28)
RDW: 14.2 % (ref 12.3–15.4)
WBC: 6 10*3/uL (ref 3.4–10.8)

## 2017-03-27 LAB — AMIODARONE LEVEL
AMIODARONE LVL: 1.5 ug/mL (ref 1.0–2.5)
N-DESETHYL-AMIODARONE: 1 ug/mL (ref 1.0–2.5)

## 2017-03-27 MED ORDER — SPIRONOLACTONE 25 MG PO TABS: 12.5000 mg | ORAL_TABLET | ORAL | 3 refills | Status: DC

## 2017-03-27 NOTE — Addendum Note (Signed)
Addended by: Levonne Hubert on: 03/27/2017 05:07 PM   Modules accepted: Orders

## 2017-04-03 ENCOUNTER — Ambulatory Visit (INDEPENDENT_AMBULATORY_CARE_PROVIDER_SITE_OTHER): Payer: Medicare Other

## 2017-04-03 VITALS — BP 116/62 | HR 65

## 2017-04-03 DIAGNOSIS — Z0130 Encounter for examination of blood pressure without abnormal findings: Secondary | ICD-10-CM

## 2017-04-03 NOTE — Patient Instructions (Signed)
Your physician recommends that you schedule a follow-up appointment in: as planned    Your physician recommends that you continue on your current medications as directed. Please refer to the Current Medication list given to you today. We will call you if there are any changes     Thank you for choosing [facility] !

## 2017-04-03 NOTE — Progress Notes (Signed)
Pt for BP check.Feels great, doing some outdoor yard work.Will get labs done tomorrow.Will stay on current meds unless directed otherwise

## 2017-04-04 ENCOUNTER — Other Ambulatory Visit (HOSPITAL_COMMUNITY)
Admission: RE | Admit: 2017-04-04 | Discharge: 2017-04-04 | Disposition: A | Discharge status: Home / Self Care | Payer: Medicare Other | Source: Ambulatory Visit | Attending: Adult Health | Admitting: Adult Health

## 2017-04-04 DIAGNOSIS — Z79899 Other long term (current) drug therapy: Secondary | ICD-10-CM | POA: Diagnosis not present

## 2017-04-04 DIAGNOSIS — Z86711 Personal history of pulmonary embolism: Secondary | ICD-10-CM | POA: Diagnosis not present

## 2017-04-04 DIAGNOSIS — I471 Supraventricular tachycardia: Secondary | ICD-10-CM | POA: Diagnosis not present

## 2017-04-04 DIAGNOSIS — I517 Cardiomegaly: Secondary | ICD-10-CM | POA: Diagnosis not present

## 2017-04-04 LAB — BASIC METABOLIC PANEL
ANION GAP: 6 (ref 5–15)
BUN: 20 mg/dL (ref 6–20)
CALCIUM: 8.9 mg/dL (ref 8.9–10.3)
CO2: 31 mmol/L (ref 22–32)
Chloride: 100 mmol/L — ABNORMAL LOW (ref 101–111)
Creatinine, Ser: 1.14 mg/dL — ABNORMAL HIGH (ref 0.44–1.00)
GFR, EST AFRICAN AMERICAN: 48 mL/min — AB (ref >60)
GFR, EST NON AFRICAN AMERICAN: 41 mL/min — AB (ref >60)
Glucose, Bld: 92 mg/dL (ref 65–99)
Potassium: 4.5 mmol/L (ref 3.5–5.1)
Sodium: 137 mmol/L (ref 135–145)

## 2017-05-07 ENCOUNTER — Other Ambulatory Visit: Payer: Self-pay | Admitting: Adult Health

## 2017-05-22 DIAGNOSIS — Z961 Presence of intraocular lens: Secondary | ICD-10-CM | POA: Diagnosis not present

## 2017-05-22 DIAGNOSIS — H353130 Nonexudative age-related macular degeneration, bilateral, stage unspecified: Secondary | ICD-10-CM | POA: Diagnosis not present

## 2017-07-11 DIAGNOSIS — H353113 Nonexudative age-related macular degeneration, right eye, advanced atrophic without subfoveal involvement: Secondary | ICD-10-CM | POA: Diagnosis not present

## 2017-07-11 DIAGNOSIS — H353124 Nonexudative age-related macular degeneration, left eye, advanced atrophic with subfoveal involvement: Secondary | ICD-10-CM | POA: Diagnosis not present

## 2017-07-11 DIAGNOSIS — H43812 Vitreous degeneration, left eye: Secondary | ICD-10-CM | POA: Diagnosis not present

## 2017-07-11 DIAGNOSIS — H353222 Exudative age-related macular degeneration, left eye, with inactive choroidal neovascularization: Secondary | ICD-10-CM | POA: Diagnosis not present

## 2017-07-11 DIAGNOSIS — H43811 Vitreous degeneration, right eye: Secondary | ICD-10-CM | POA: Diagnosis not present

## 2017-07-13 DIAGNOSIS — M546 Pain in thoracic spine: Secondary | ICD-10-CM | POA: Diagnosis not present

## 2017-07-13 DIAGNOSIS — M5136 Other intervertebral disc degeneration, lumbar region: Secondary | ICD-10-CM | POA: Diagnosis not present

## 2017-07-13 DIAGNOSIS — M4155 Other secondary scoliosis, thoracolumbar region: Secondary | ICD-10-CM | POA: Diagnosis not present

## 2017-07-13 DIAGNOSIS — R030 Elevated blood-pressure reading, without diagnosis of hypertension: Secondary | ICD-10-CM | POA: Diagnosis not present

## 2017-07-13 DIAGNOSIS — M4316 Spondylolisthesis, lumbar region: Secondary | ICD-10-CM | POA: Diagnosis not present

## 2017-07-13 DIAGNOSIS — M5440 Lumbago with sciatica, unspecified side: Secondary | ICD-10-CM | POA: Diagnosis not present

## 2017-07-13 DIAGNOSIS — M549 Dorsalgia, unspecified: Secondary | ICD-10-CM | POA: Diagnosis not present

## 2017-07-13 DIAGNOSIS — M47816 Spondylosis without myelopathy or radiculopathy, lumbar region: Secondary | ICD-10-CM | POA: Diagnosis not present

## 2017-07-13 DIAGNOSIS — M5416 Radiculopathy, lumbar region: Secondary | ICD-10-CM | POA: Diagnosis not present

## 2017-09-18 DIAGNOSIS — Z1389 Encounter for screening for other disorder: Secondary | ICD-10-CM | POA: Diagnosis not present

## 2017-09-18 DIAGNOSIS — Z6822 Body mass index (BMI) 22.0-22.9, adult: Secondary | ICD-10-CM | POA: Diagnosis not present

## 2017-09-18 DIAGNOSIS — E559 Vitamin D deficiency, unspecified: Secondary | ICD-10-CM | POA: Diagnosis not present

## 2017-09-18 DIAGNOSIS — Z23 Encounter for immunization: Secondary | ICD-10-CM | POA: Diagnosis not present

## 2017-09-18 DIAGNOSIS — I482 Chronic atrial fibrillation: Secondary | ICD-10-CM | POA: Diagnosis not present

## 2017-09-18 DIAGNOSIS — M810 Age-related osteoporosis without current pathological fracture: Secondary | ICD-10-CM | POA: Diagnosis not present

## 2017-09-18 DIAGNOSIS — I2699 Other pulmonary embolism without acute cor pulmonale: Secondary | ICD-10-CM | POA: Diagnosis not present

## 2017-09-18 DIAGNOSIS — N183 Chronic kidney disease, stage 3 (moderate): Secondary | ICD-10-CM | POA: Diagnosis not present

## 2017-09-18 DIAGNOSIS — I517 Cardiomegaly: Secondary | ICD-10-CM | POA: Diagnosis not present

## 2017-09-19 DIAGNOSIS — N183 Chronic kidney disease, stage 3 (moderate): Secondary | ICD-10-CM | POA: Diagnosis not present

## 2017-09-19 DIAGNOSIS — Z23 Encounter for immunization: Secondary | ICD-10-CM | POA: Diagnosis not present

## 2017-09-19 DIAGNOSIS — M810 Age-related osteoporosis without current pathological fracture: Secondary | ICD-10-CM | POA: Diagnosis not present

## 2017-09-19 DIAGNOSIS — Z1389 Encounter for screening for other disorder: Secondary | ICD-10-CM | POA: Diagnosis not present

## 2017-09-25 ENCOUNTER — Other Ambulatory Visit: Payer: Self-pay | Admitting: Adult Health

## 2017-10-29 DIAGNOSIS — Z6822 Body mass index (BMI) 22.0-22.9, adult: Secondary | ICD-10-CM | POA: Diagnosis not present

## 2017-10-29 DIAGNOSIS — Z1389 Encounter for screening for other disorder: Secondary | ICD-10-CM | POA: Diagnosis not present

## 2017-10-29 DIAGNOSIS — Z0001 Encounter for general adult medical examination with abnormal findings: Secondary | ICD-10-CM | POA: Diagnosis not present

## 2017-10-29 IMAGING — DX DG CHEST 2V
2 series · 2 of 2 positions shown · non-contrast
Comparison: Chest x-ray dated 02/23/2004

CLINICAL DATA: Shortness of breath for 3 days.  Former smoker.

EXAM:
CHEST  2 VIEW

[chest pa]
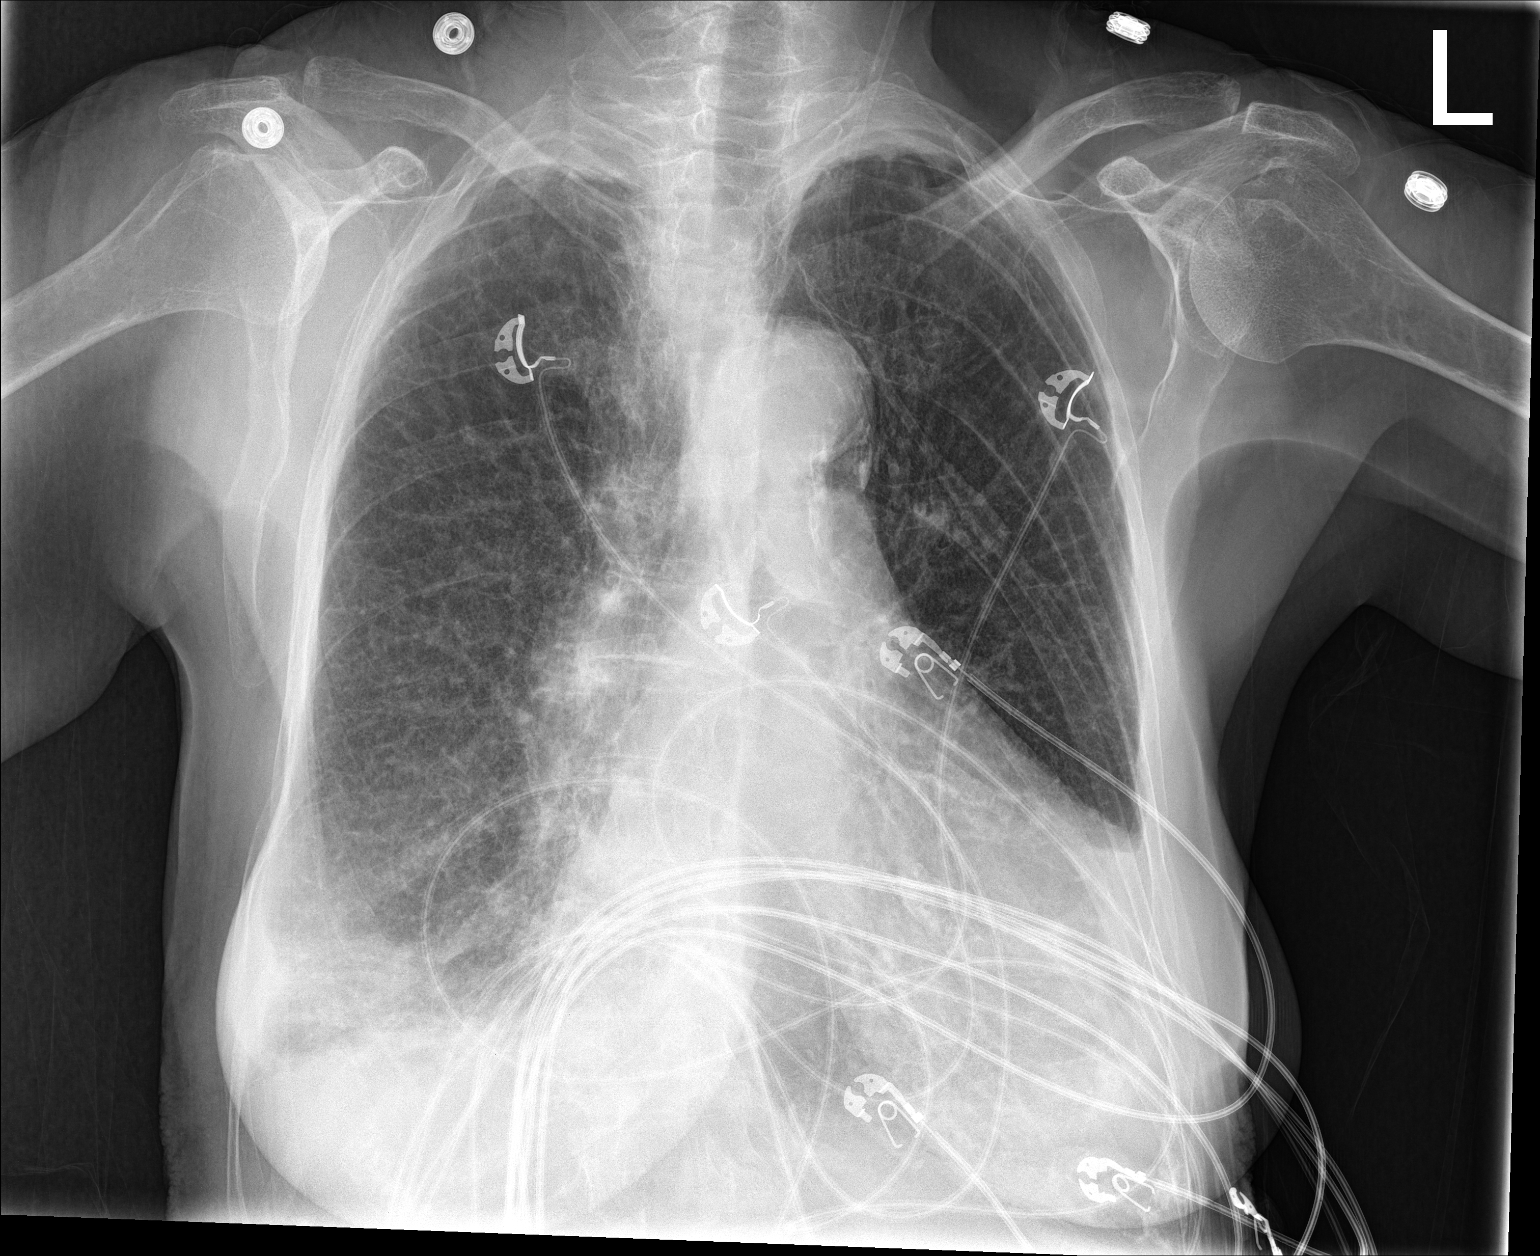

[chest lat]
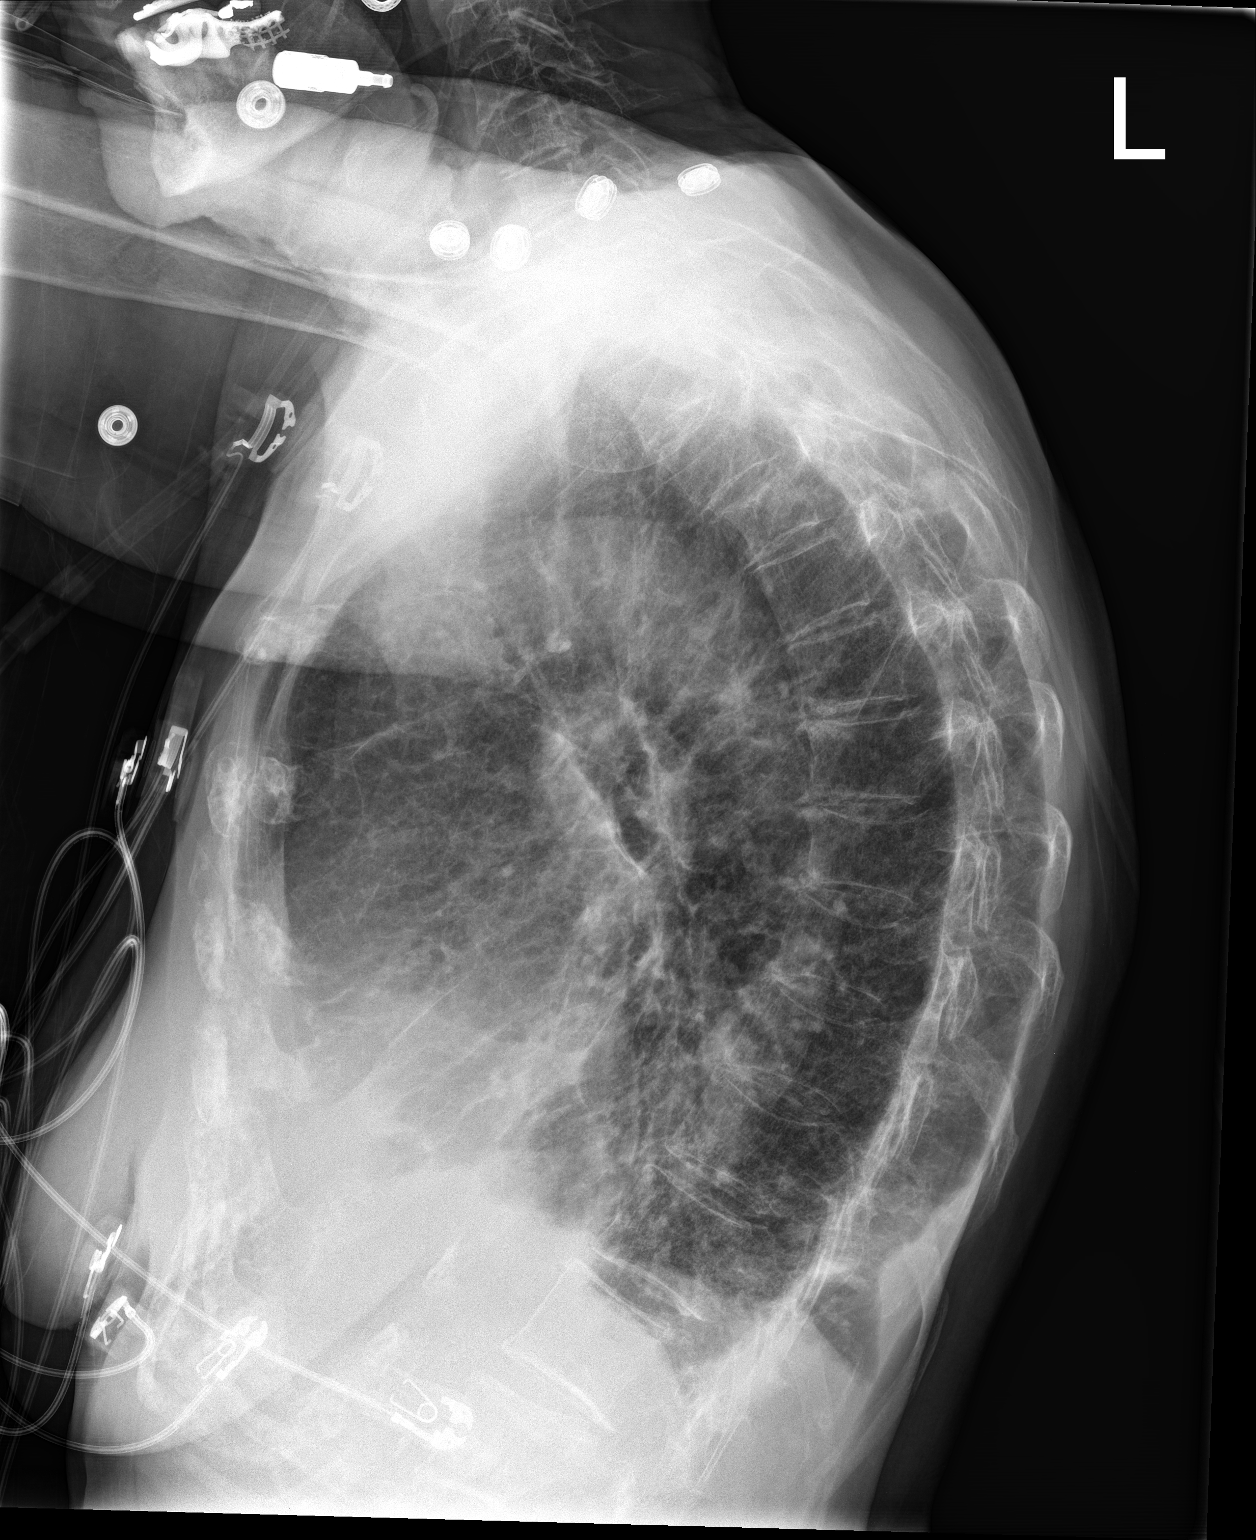

[2 of 2 positions shown; findings below may reference images not displayed]

FINDINGS: There is cardiomegaly. Atherosclerotic changes noted at the aortic
arch.

Lungs are hyperexpanded. Coarse interstitial markings are seen
bilaterally, most likely chronic interstitial lung disease. More
confluent opacities are seen at each lung base, right greater than
left, of uncertain acuity. Suspect small pleural effusions.

Mild degenerative spurring noted within the kyphotic thoracic spine.
No acute or suspicious osseous finding.
IMPRESSION: 1. Lungs are hyperexpanded suggesting COPD. Probable associated
chronic interstitial lung disease.
2. Confluent opacities at each lung base. These could represent
acute pneumonia or chronic confluent scarring/fibrosis. There are
new compared to a previous chest x-ray dated 02/23/2004 but there
are no recent prior studies available to assess chronicity.
3. Probable small pleural effusions bilaterally.
4. Aortic atherosclerosis.

## 2017-10-29 IMAGING — CT CT ANGIO CHEST
2 of 6 series · 17 of 46 positions shown · IV contrast (ISOVUE)
Comparison: Chest x-ray from earlier same day.

CLINICAL DATA: Shortness of breath for 3 days.

EXAM:
CT ANGIOGRAPHY CHEST WITH CONTRAST
TECHNIQUE: Multidetector CT imaging of the chest was performed using the
standard protocol during bolus administration of intravenous
contrast. Multiplanar CT image reconstructions and MIPs were
obtained to evaluate the vascular anatomy.
CONTRAST:  100 cc Isovue 370

[Series 6: pe thins 1.0 · axial · 0.71mm/px · z∈[-338,-74]mm · 14 of 294 slices shown]
[im 15/294  lung]
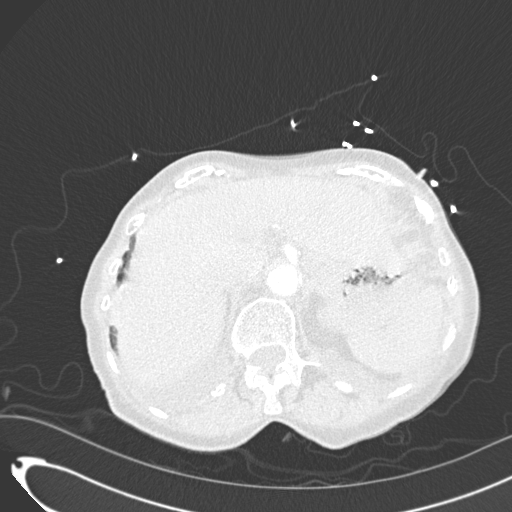
[im 44/294  soft-tissue]
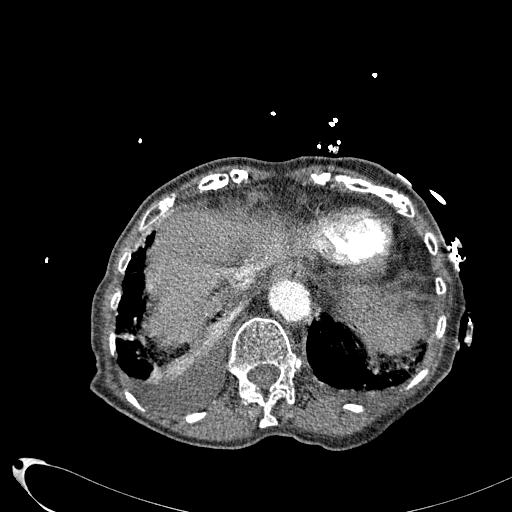
[im 59/294  lung]
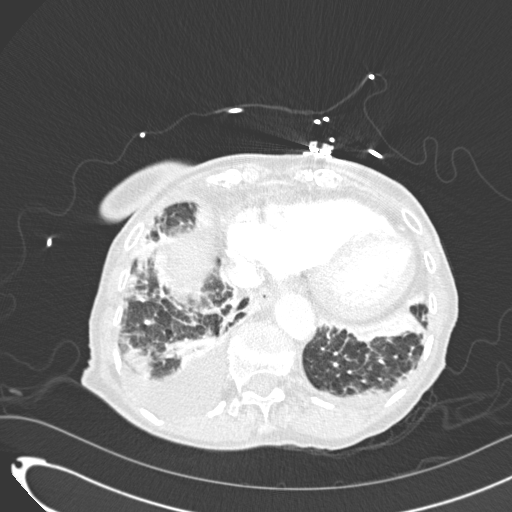
[im 74/294  soft-tissue]
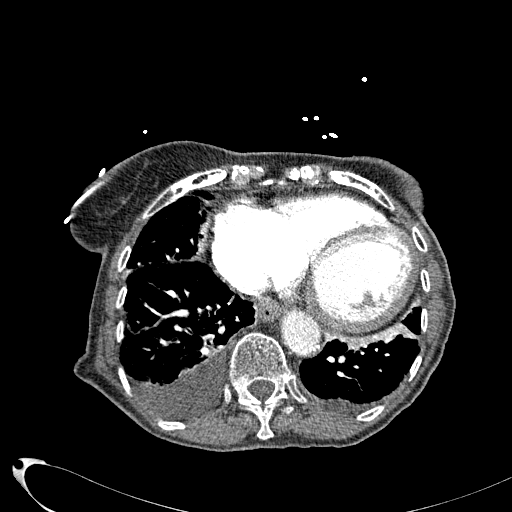
[im 103/294  lung]
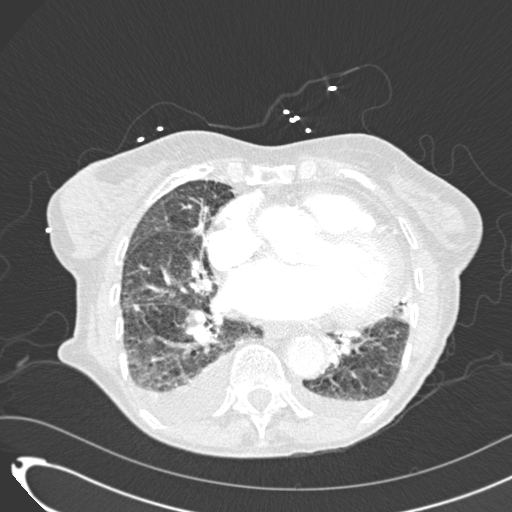
[im 118/294  soft-tissue]
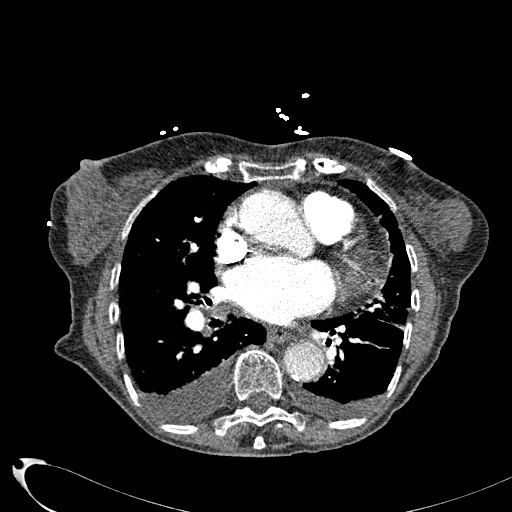
[im 132/294  lung]
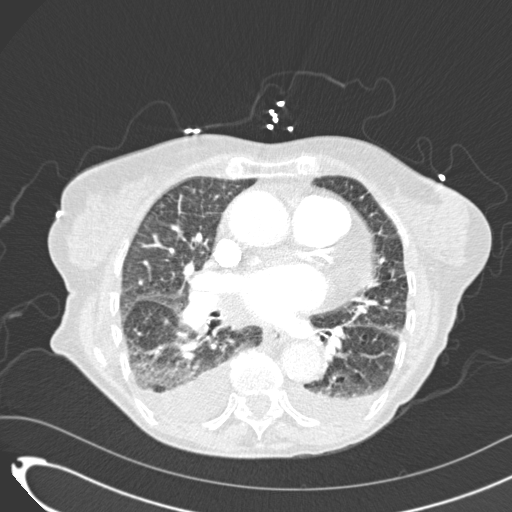
[im 162/294  soft-tissue]
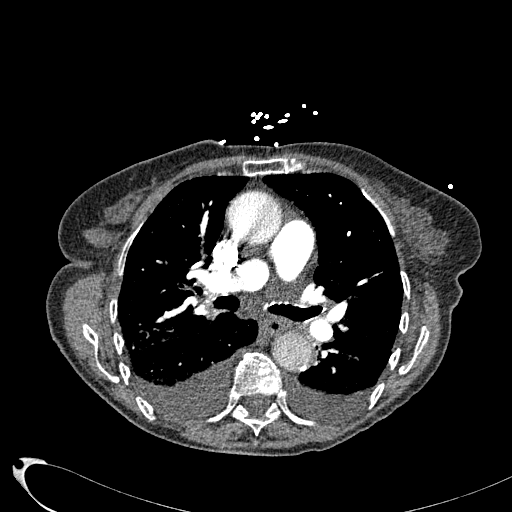
[im 176/294  lung]
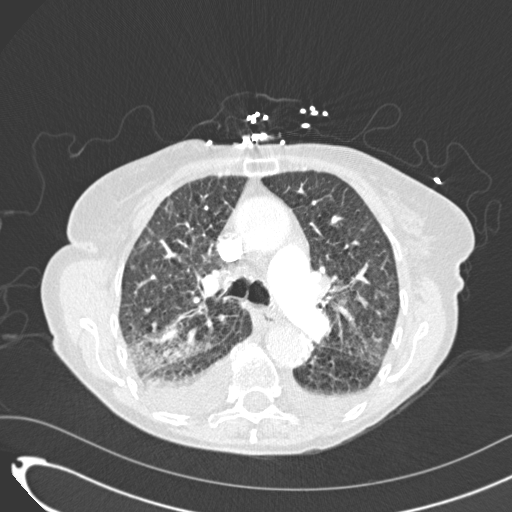
[im 191/294  soft-tissue]
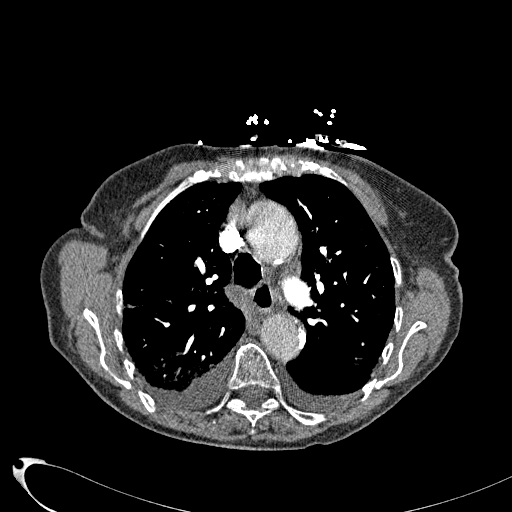
[im 220/294  lung]
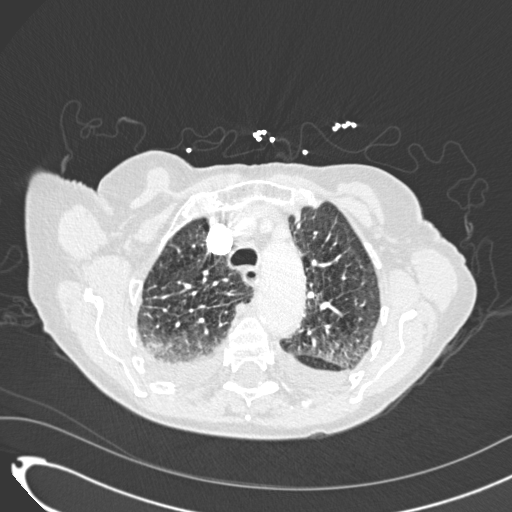
[im 235/294  soft-tissue]
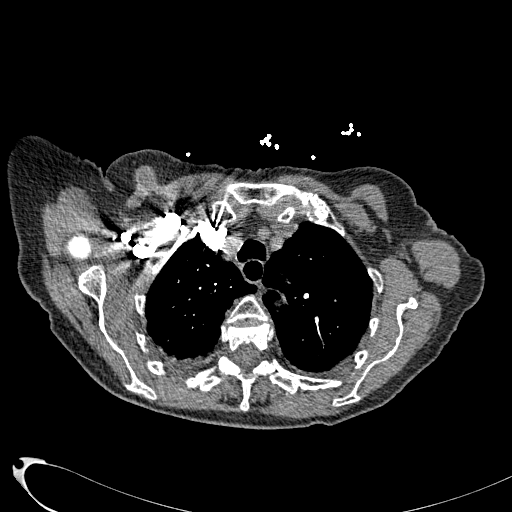
[im 250/294  lung]
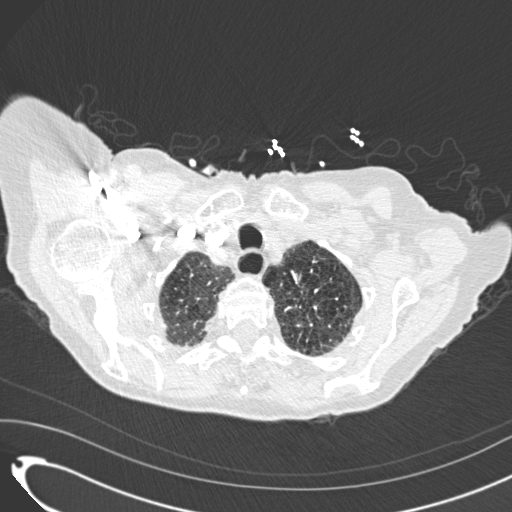
[im 279/294  soft-tissue]
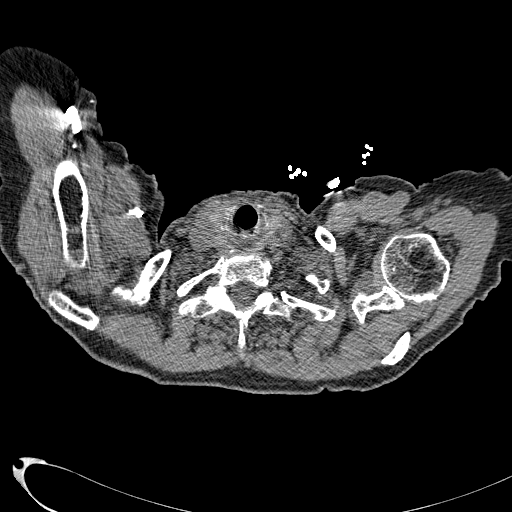

[Series 8: cor mpr 2.0 · coronal · 0.53mm/px · 3 of 103 slices shown]
[im 26/103  soft-tissue]
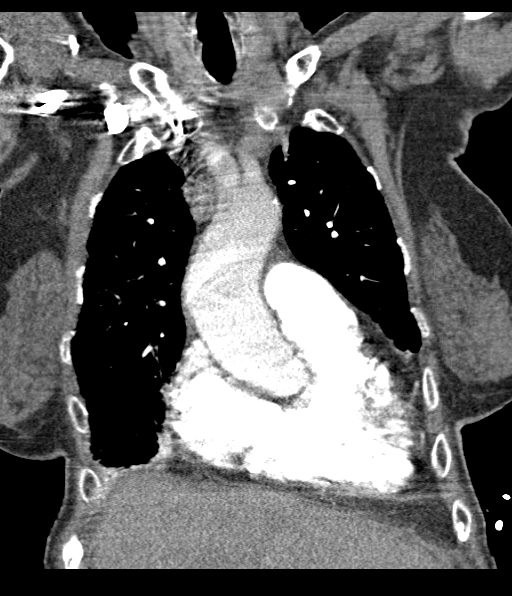
[im 52/103  soft-tissue]
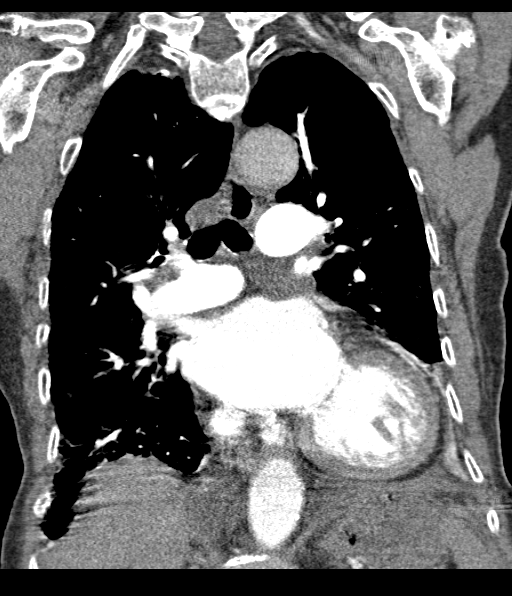
[im 77/103  soft-tissue]
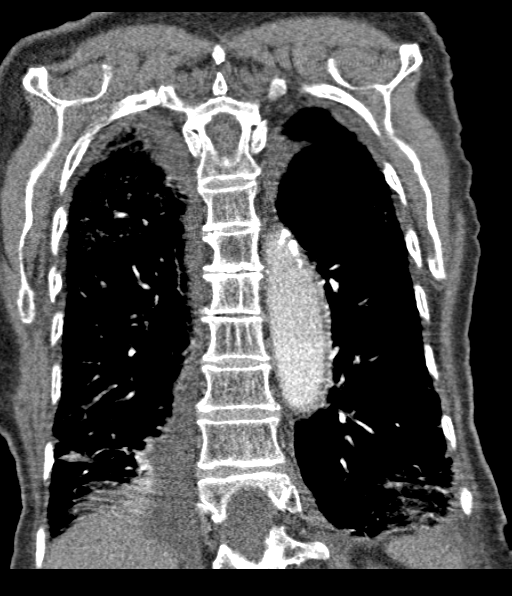

[17 of 46 positions shown; findings below may reference images not displayed]

FINDINGS: Mediastinum/Lymph Nodes: Some of the most peripheral segmental and
subsegmental pulmonary artery branches to the lower lobes are
difficult to definitively characterize due to patient breathing
motion artifact but there is a single acute-appearing pulmonary
embolus identified at the junction of the segmental pulmonary artery
branches to the posterior and lateral segments of the right lower
lobe.

No other pulmonary emboli identified, with limitations detailed
above.

Scattered atherosclerotic changes seen along the walls of the
normal-caliber thoracic aorta. No aneurysm or dissection seen. Heart
is enlarged. No pericardial effusion. Coronary artery calcifications
noted.

No mass or enlarged lymph nodes seen within the mediastinum or
perihilar regions. Trachea and central bronchi are unremarkable.

Lungs/Pleura: Bilateral moderate-sized pleural effusions, right
slightly greater than left. Associated interstitial edema at the
lung bases. Scarring/fibrosis and emphysematous changes noted within
the upper lobes. No evidence of pneumonia seen. No pneumothorax.

Upper abdomen: Limited images of the upper abdomen are unremarkable.

Musculoskeletal: Mild degenerative spurring noted within the
slightly scoliotic thoracic spine. Mild compression deformity of an
upper thoracic vertebral body appears chronic. No acute or
suspicious osseous finding. Superficial soft tissues are
unremarkable.

Review of the MIP images confirms the above findings.
IMPRESSION: 1. Single acute-appearing pulmonary embolus identified at the
junction of the segmental pulmonary artery branches to the posterior
and lateral segments of the right lower lobe. No other pulmonary
emboli identified, with mild study limitations detailed above.
2. Bilateral moderate-sized pleural effusions, right slightly
greater than left. Associated interstitial edema and atelectasis at
the right lung base. No evidence of pneumonia seen.
3. Scarring/fibrosis and emphysematous changes within the upper
lobes bilaterally, mild to moderate in degree.
4. Aortic atherosclerosis.
5. Cardiomegaly. Coronary artery calcifications, particularly dense
within the left anterior descending and circumflex coronary
arteries. Recommend correlation with any possible associated cardiac
symptoms.
These results were called by telephone at the time of interpretation
on 07/03/2016 at [DATE] to Dr. ONALEKITSO AZANIA , who verbally
acknowledged these results.

## 2017-11-27 ENCOUNTER — Ambulatory Visit (INDEPENDENT_AMBULATORY_CARE_PROVIDER_SITE_OTHER): Payer: Medicare Other | Admitting: Cardiology

## 2017-11-27 ENCOUNTER — Encounter: Payer: Self-pay | Admitting: Cardiology

## 2017-11-27 VITALS — BP 104/60 | HR 62 | Ht 63.0 in | Wt 124.0 lb

## 2017-11-27 DIAGNOSIS — Z86711 Personal history of pulmonary embolism: Secondary | ICD-10-CM | POA: Diagnosis not present

## 2017-11-27 DIAGNOSIS — I429 Cardiomyopathy, unspecified: Secondary | ICD-10-CM

## 2017-11-27 DIAGNOSIS — I471 Supraventricular tachycardia: Secondary | ICD-10-CM | POA: Diagnosis not present

## 2017-11-27 MED ORDER — AMIODARONE HCL 200 MG PO TABS: 100.0000 mg | ORAL_TABLET | Freq: Every day | ORAL | 3 refills | Status: DC

## 2017-11-27 NOTE — Patient Instructions (Signed)
Your physician wants you to follow-up in: 6 months with Dr.McDowell You will receive a reminder letter in the mail two months in advance. If you don't receive a letter, please call our office to schedule the follow-up appointment.     DECREASE Amiodarone to 100 mg daily ( take 1/2 tablet of the 200 mg pill )     If you need a refill on your cardiac medications before your next appointment, please call your pharmacy.     No lab tests or tests ordered today.       Thank you for choosing [facility] !

## 2017-11-27 NOTE — Progress Notes (Signed)
Cardiology Office Note  Date: 11/27/2017   ID: KEYANI RIGDON, DOB 03-Sep-1924, MRN [id number]  PCP: Sharilyn Sites, MD  Evaluating cardiologist: Rozann Lesches, MD   Chief Complaint  Patient presents with  . Cardiomyopathy    History of Present Illness: Miranda Ford is a 83 y.o. female that I am meeting for the first time in clinic today.  She was most recently seen by Ms. West Pugh in April.  I reviewed extensive records and updated the chart.  She is here today with her niece for a follow-up visit.  She continues to do relatively well, lives in her own home and functions with ADLs.  Her husband died about 2 months ago (they were married 49 years).   She does not report any chest pain or palpitations, stable NYHA class II dyspnea.  She has had no syncope.  She had lab work done with Dr. Hilma Favors back in September, results being requested.  I reviewed her medications which are outlined below.  We discussed reducing amiodarone to 100 mg daily.  She does not report any bleeding problems on Eliquis.  Past Medical History:  Diagnosis Date  . Cataracts, bilateral   . Chronic systolic CHF (congestive heart failure) ([place])    a. dx 06/2016 - conservative rx recommended. EF 20% with biventricular failure in setting of PE.  . Demand ischemia ([place])   . Epistaxis   . Macular degeneration    Dry OD; wet OS  . Osteoporosis    07/18/16 previously on Premarin; S/P > 10 years Alendronate  . Paroxysmal atrial tachycardia ([place])    a. placed on amiodarone 06/2016.  . Pulmonary emboli ([place]) 06/2016    Past Surgical History:  Procedure Laterality Date  . CATARACT EXTRACTION     bilaterally  . CHOLECYSTECTOMY    . TONSILLECTOMY      Current Outpatient Medications  Medication Sig Dispense Refill  . apixaban (ELIQUIS) 5 MG TABS tablet Take 1 tablet (5 mg total) by mouth 2 (two) times daily. 60 tablet 0  . atorvastatin (LIPITOR) 80 MG tablet TAKE 1 TABLET BY MOUTH EACH EVENING AT 6PM. 90  tablet 3  . calcium carbonate (OSCAL) 1500 (600 Ca) MG TABS tablet Take 1,500 mg by mouth 2 (two) times daily with a meal.    . carvedilol (COREG) 3.125 MG tablet TAKE 1 TABLET BY MOUTH TWICE DAILY WITH A MEAL. 60 tablet 6  . losartan (COZAAR) 25 MG tablet Take 0.5 tablets (12.5 mg total) by mouth daily. 30 tablet 0  . Multiple Vitamins-Minerals (ICAPS PO) Take 1 capsule by mouth 2 (two) times daily.    Marland Kitchen amiodarone (PACERONE) 200 MG tablet Take 0.5 tablets (100 mg total) by mouth daily. 45 tablet 3  . spironolactone (ALDACTONE) 25 MG tablet Take 0.5 tablets (12.5 mg total) by mouth every other day. 45 tablet 3   No current facility-administered medications for this visit.    Allergies:  Codeine and Penicillins   Social History: The patient  reports that she quit smoking about 43 years ago. Her smoking use included cigarettes. she has never used smokeless tobacco. She reports that she does not drink alcohol or use drugs.   ROS:  Please see the history of present illness. Otherwise, complete review of systems is positive for vision loss.  All other systems are reviewed and negative.   Physical Exam: VS:  BP 104/60   Pulse 62   Ht 5\' 3"  (1.6 m)   Wt 124  lb (56.2 kg)   SpO2 95%   BMI 21.97 kg/m , BMI Body mass index is 21.97 kg/m.  Wt Readings from Last 3 Encounters:  11/27/17 124 lb (56.2 kg)  03/26/17 132 lb (59.9 kg)  09/12/16 131 lb (59.4 kg)    General: Elderly woman, appears comfortable at rest. HEENT: Conjunctiva and lids normal, oropharynx clear. Neck: Supple, no elevated JVP or carotid bruits, no thyromegaly. Lungs: Clear to auscultation, nonlabored breathing at rest. Cardiac: Regular rate and rhythm, no S3 or significant systolic murmur, no pericardial rub. Abdomen: Soft, nontender, bowel sounds present. Extremities: No pitting edema, distal pulses 2+. Skin: Warm and dry. Musculoskeletal: Mild kyphosis. Neuropsychiatric: Alert and oriented x3, affect grossly  appropriate.  ECG: I personally reviewed the tracing from 08/01/2016 which showed sinus rhythm with right bundle branch block, possible RVH and nonspecific T wave changes.  Recent Labwork: 03/26/2017: Hemoglobin 12.9; Platelets 207 04/04/2017: BUN 20; Creatinine, Ser 1.14; Potassium 4.5; Sodium 137     Component Value Date/Time   CHOL 141 07/04/2016 0443   TRIG 41 07/04/2016 0443   HDL 56 07/04/2016 0443   CHOLHDL 2.5 07/04/2016 0443   VLDL 8 07/04/2016 0443   LDLCALC 77 07/04/2016 0443    Other Studies Reviewed Today:  Echocardiogram 09/19/2016: Study Conclusions  - Left ventricle: The cavity size was mildly dilated. Wall   thickness was normal. The estimated ejection fraction was 25%.   Diffuse hypokinesis. Doppler parameters are consistent with   abnormal left ventricular relaxation (grade 1 diastolic   dysfunction). Doppler parameters are consistent with high   ventricular filling pressure. - Aortic valve: Probably mild to moderate aortic stenosis vs   pseudo-stenosis due to reduced left ventricular output.   Moderately thickened, moderately calcified leaflets. There was   mild regurgitation. Peak velocity (S): 195 cm/s. Mean gradient   (S): 7 mm Hg. Valve area (VTI): 1.41 cm^2. Valve area (Vmax):   1.46 cm^2. Valve area (Vmean): 1.58 cm^2. - Aorta: Aortic root at upper normal limits in diameter. Aortic   root dimension: 39 mm (ED). - Mitral valve: Calcified annulus. Mildly thickened, mildly   calcified leaflets . There was mild regurgitation. - Left atrium: The atrium was moderately dilated. - Right ventricle: Systolic function was mildly reduced. - Tricuspid valve: There was moderate regurgitation. - Pulmonary arteries: PA peak pressure: 44 mm Hg (S). - Inferior vena cava: The vessel was severely dilated. The   respirophasic diameter changes were blunted (< 50%), consistent   with elevated central venous pressure.  Assessment and Plan:  1.  History of paroxysmal  atrial tachycardia, quiescent in terms of palpitations with heart rate in the 60s today.  Plan to reduce amiodarone to 100 mg daily for now.  Requesting recent lab work from Dr. Hilma Favors.  Might actually be reasonable to stop it if she has had no recurrences by next visit.  2.  Cardiomyopathy with LVEF approximately 25%.  Plan to continue conservative medical therapy.  No evidence of fluid overload on examination.  3.  History of pulmonary embolus, she has been continued on Eliquis by Dr. Hilma Favors.  Chest CT from July 2017 indicated single embolus at the junction of the segmental pulmonary artery branches to the posterior and lateral segments of the right lower lobe.  She did have significant right ventricular dysfunction at the time, improved but not normalized in follow-up.  Not entirely clear to me that she needs to be on anticoagulation long-term necessarily.  Current medicines were reviewed with the  patient today.   Orders Placed This Encounter  Procedures  . EKG 12-Lead    Disposition: Follow-up in 6 months.  Signed, Satira Sark, MD, [facility] 11/27/2017 2:41 PM    [place] [facility] [address], [place], [place] [address] Phone: [phone]; Fax: [phone]

## 2018-02-24 ENCOUNTER — Encounter (HOSPITAL_COMMUNITY): Payer: Self-pay

## 2018-02-24 ENCOUNTER — Emergency Department (HOSPITAL_COMMUNITY)
Admission: EM | Admit: 2018-02-24 | Discharge: 2018-02-24 | Disposition: A | Discharge status: Home / Self Care | Payer: Medicare Other | Attending: Emergency Medicine | Admitting: Emergency Medicine

## 2018-02-24 DIAGNOSIS — J449 Chronic obstructive pulmonary disease, unspecified: Secondary | ICD-10-CM | POA: Diagnosis not present

## 2018-02-24 DIAGNOSIS — Z79899 Other long term (current) drug therapy: Secondary | ICD-10-CM | POA: Diagnosis not present

## 2018-02-24 DIAGNOSIS — J029 Acute pharyngitis, unspecified: Secondary | ICD-10-CM | POA: Insufficient documentation

## 2018-02-24 DIAGNOSIS — Z7901 Long term (current) use of anticoagulants: Secondary | ICD-10-CM | POA: Insufficient documentation

## 2018-02-24 DIAGNOSIS — I2510 Atherosclerotic heart disease of native coronary artery without angina pectoris: Secondary | ICD-10-CM | POA: Diagnosis not present

## 2018-02-24 DIAGNOSIS — Z87891 Personal history of nicotine dependence: Secondary | ICD-10-CM | POA: Insufficient documentation

## 2018-02-24 DIAGNOSIS — I5022 Chronic systolic (congestive) heart failure: Secondary | ICD-10-CM | POA: Insufficient documentation

## 2018-02-24 LAB — RAPID STREP SCREEN (MED CTR MEBANE ONLY): STREPTOCOCCUS, GROUP A SCREEN (DIRECT): NEGATIVE

## 2018-02-24 MED ORDER — DEXAMETHASONE 10 MG/ML FOR PEDIATRIC ORAL USE
4.0000 mg | Freq: Once | INTRAMUSCULAR | Status: AC
  Administered 2018-02-24: 4 mg via ORAL
  Filled 2018-02-24: qty 1

## 2018-02-24 MED ORDER — HYDROCODONE-ACETAMINOPHEN 5-325 MG PO TABS: 1.0000 | ORAL_TABLET | ORAL | 0 refills | Status: DC | PRN

## 2018-02-24 MED ORDER — HYDROCODONE-ACETAMINOPHEN 7.5-325 MG/15ML PO SOLN: 15.0000 mL | Freq: Four times a day (QID) | ORAL | 0 refills | Status: DC | PRN

## 2018-02-24 NOTE — ED Triage Notes (Signed)
Pt c/o sore throat since Friday.

## 2018-02-24 NOTE — ED Provider Notes (Signed)
[facility] EMERGENCY DEPARTMENT Provider Note   CSN: [id number] Arrival date & time: 02/24/18  1020     History   Chief Complaint Chief Complaint  Patient presents with  . Sore Throat    HPI Miranda Ford is a 84 y.o. female.  HPI Patient presents with sore throat.  Has had for the last 2 days.  Worse with swallowing.  No fevers.  Some pain with swallowing but still is able to swallow.  No cough.  No abdominal pain.  No chest pain.  No known sick contacts.  She is on anticoagulation for pulmonary embolism.  No trauma. Past Medical History:  Diagnosis Date  . Cataracts, bilateral   . Chronic systolic CHF (congestive heart failure) ([place])    a. dx 06/2016 - conservative rx recommended. EF 20% with biventricular failure in setting of PE.  . Demand ischemia ([place])   . Epistaxis   . Macular degeneration    Dry OD; wet OS  . Osteoporosis    07/18/16 previously on Premarin; S/P > 10 years Alendronate  . Paroxysmal atrial tachycardia ([place])    a. placed on amiodarone 06/2016.  . Pulmonary emboli ([place]) 06/2016    Patient Active Problem List   Diagnosis Date Noted  . Senile osteoporosis 07/18/2016  . DNR (do not resuscitate) discussion   . Palliative care encounter   . Epistaxis 07/07/2016  . Acute systolic CHF (congestive heart failure) ([place]) 07/05/2016  . Acute respiratory failure with hypoxia ([place]) 07/05/2016  . Secondary cardiomyopathy ([place])   . Biventricular failure ([place])   . Atrial tachycardia ([place])   . Paroxysmal atrial tachycardia ([place])   . Demand ischemia ([place])   . Acute pulmonary embolism ([place]) 07/03/2016  . COPD exacerbation ([place]) 07/03/2016  . Cardiomegaly 07/03/2016  . Coronary atherosclerosis of native coronary artery 07/03/2016  . Elevated troponin     Past Surgical History:  Procedure Laterality Date  . CATARACT EXTRACTION     bilaterally  . CHOLECYSTECTOMY    . TONSILLECTOMY      OB History    Gravida Para Term Preterm AB Living   0             SAB TAB Ectopic Multiple Live Births                   Home Medications    Prior to Admission medications   Medication Sig Start Date End Date Taking? Authorizing Provider  amiodarone (PACERONE) 200 MG tablet Take 0.5 tablets (100 mg total) by mouth daily. 11/27/17  Yes Satira Sark, MD  apixaban (ELIQUIS) 5 MG TABS tablet Take 1 tablet (5 mg total) by mouth 2 (two) times daily. 07/15/16  Yes Donne Hazel, MD  atorvastatin (LIPITOR) 40 MG tablet Take 1 tablet by mouth at bedtime. 02/18/18  Yes [provider]  calcium carbonate (OSCAL) 1500 (600 Ca) MG TABS tablet Take 1,500 mg by mouth 2 (two) times daily with a meal.   Yes [provider]  carvedilol (COREG) 3.125 MG tablet TAKE 1 TABLET BY MOUTH TWICE DAILY WITH A MEAL. 09/25/17  Yes Lendon Colonel, NP  Multiple Vitamins-Minerals (ICAPS PO) Take 1 capsule by mouth 2 (two) times daily.   Yes [provider]  spironolactone (ALDACTONE) 25 MG tablet Take 0.5 tablets (12.5 mg total) by mouth every other day. 03/27/17 02/24/18 Yes Lendon Colonel, NP  HYDROcodone-acetaminophen (HYCET) 7.5-325 mg/15 ml solution Take 15 mLs by mouth every 6 (six) hours as  needed for moderate pain. 02/24/18   Davonna Belling, MD  HYDROcodone-acetaminophen (NORCO/VICODIN) 5-325 MG tablet Take 1-2 tablets by mouth every 4 (four) hours as needed. 02/24/18   Davonna Belling, MD  losartan (COZAAR) 25 MG tablet Take 0.5 tablets (12.5 mg total) by mouth daily. Patient not taking: Reported on 02/24/2018 07/15/16   Donne Hazel, MD    Family History Family History  Problem Relation Age of Onset  . Heart disease Mother   . Stroke Mother   . Diabetes Father   . Cancer Sister        1 sister with metastatic bone cancer; 1 with breast cancer    Social History Social History   Tobacco Use  . Smoking status: Former Smoker    Types: Cigarettes    Last attempt to quit: 1975    Years since quitting: 44.1  . Smokeless tobacco:  Never Used  Substance Use Topics  . Alcohol use: No  . Drug use: No     Allergies   Codeine and Penicillins   Review of Systems Review of Systems  Constitutional: Negative for appetite change.  HENT: Positive for sore throat.   Respiratory: Negative for shortness of breath.   Cardiovascular: Negative for chest pain.  Gastrointestinal: Negative for abdominal pain.  Genitourinary: Negative for flank pain.  Musculoskeletal: Negative for back pain.  Skin: Negative for rash.  Neurological: Negative for numbness.  Hematological: Negative for adenopathy.  Psychiatric/Behavioral: Negative for confusion.     Physical Exam Updated Vital Signs BP (!) 157/65 (BP Location: Right Arm)   Pulse 63   Temp 98.2 F (36.8 C) (Oral)   Resp 18   Ht 5\' 3"  (1.6 m)   Wt 56.2 kg (124 lb)   SpO2 96%   BMI 21.97 kg/m   Physical Exam  Constitutional: She appears well-developed.  HENT:  Head: Atraumatic.  Mouth/Throat: Oropharynx is clear and moist and mucous membranes are normal. No tonsillar abscesses.  Mild posterior pharyngeal erythema without exudate.  No asymmetric swelling.  Cardiovascular: Normal rate and regular rhythm.  Abdominal: Soft. There is no tenderness.  Neurological: She is alert.  Skin: Skin is warm. Capillary refill takes less than 2 seconds.     ED Treatments / Results  Labs (all labs ordered are listed, but only abnormal results are displayed) Labs Reviewed  RAPID STREP SCREEN (NOT AT [facility])  CULTURE, GROUP A STREP [facility])    EKG  EKG Interpretation None       Radiology No results found.  Procedures Procedures (including critical care time)  Medications Ordered in ED Medications  dexamethasone (DECADRON) 10 MG/ML injection for Pediatric ORAL use 4 mg (not administered)     Initial Impression / Assessment and Plan / ED Course  I have reviewed the triage vital signs and the nursing notes.  Pertinent labs & imaging results that were available  during my care of the patient were reviewed by me and considered in my medical decision making (see chart for details).     Patient with sore throat.  Negative strep.  Doubt severe abscess or swelling at this time.  Will give Decadron and pain medicine.  Discharge home to follow-up as needed.  Final Clinical Impressions(s) / ED Diagnoses   Final diagnoses:  Pharyngitis, unspecified etiology    ED Discharge Orders        Ordered    HYDROcodone-acetaminophen (NORCO/VICODIN) 5-325 MG tablet  Every 4 hours PRN     02/24/18 1257  HYDROcodone-acetaminophen (HYCET) 7.5-325 mg/15 ml solution  Every 6 hours PRN     02/24/18 1258       Davonna Belling, MD 02/24/18 1311

## 2018-02-27 LAB — CULTURE, GROUP A STREP (THRC)

## 2018-04-10 DIAGNOSIS — H43811 Vitreous degeneration, right eye: Secondary | ICD-10-CM | POA: Diagnosis not present

## 2018-04-10 DIAGNOSIS — H353124 Nonexudative age-related macular degeneration, left eye, advanced atrophic with subfoveal involvement: Secondary | ICD-10-CM | POA: Diagnosis not present

## 2018-04-10 DIAGNOSIS — H353113 Nonexudative age-related macular degeneration, right eye, advanced atrophic without subfoveal involvement: Secondary | ICD-10-CM | POA: Diagnosis not present

## 2018-04-10 DIAGNOSIS — H43812 Vitreous degeneration, left eye: Secondary | ICD-10-CM | POA: Diagnosis not present

## 2018-04-11 ENCOUNTER — Encounter (HOSPITAL_COMMUNITY): Payer: Self-pay

## 2018-04-11 ENCOUNTER — Emergency Department (HOSPITAL_COMMUNITY)
Admission: EM | Admit: 2018-04-11 | Discharge: 2018-04-11 | Disposition: A | Discharge status: Home / Self Care | Payer: Medicare Other | Attending: Emergency Medicine | Admitting: Emergency Medicine

## 2018-04-11 ENCOUNTER — Other Ambulatory Visit: Payer: Self-pay

## 2018-04-11 DIAGNOSIS — Z87891 Personal history of nicotine dependence: Secondary | ICD-10-CM | POA: Insufficient documentation

## 2018-04-11 DIAGNOSIS — Z7901 Long term (current) use of anticoagulants: Secondary | ICD-10-CM | POA: Diagnosis not present

## 2018-04-11 DIAGNOSIS — I5022 Chronic systolic (congestive) heart failure: Secondary | ICD-10-CM | POA: Diagnosis not present

## 2018-04-11 DIAGNOSIS — I2510 Atherosclerotic heart disease of native coronary artery without angina pectoris: Secondary | ICD-10-CM | POA: Diagnosis not present

## 2018-04-11 DIAGNOSIS — R040 Epistaxis: Secondary | ICD-10-CM | POA: Insufficient documentation

## 2018-04-11 DIAGNOSIS — J449 Chronic obstructive pulmonary disease, unspecified: Secondary | ICD-10-CM | POA: Insufficient documentation

## 2018-04-11 DIAGNOSIS — Z79899 Other long term (current) drug therapy: Secondary | ICD-10-CM | POA: Insufficient documentation

## 2018-04-11 LAB — I-STAT CHEM 8, ED
BUN: 29 mg/dL — ABNORMAL HIGH (ref 6–20)
CHLORIDE: 102 mmol/L (ref 101–111)
CREATININE: 1.1 mg/dL — AB (ref 0.44–1.00)
Calcium, Ion: 1.16 mmol/L (ref 1.15–1.40)
GLUCOSE: 102 mg/dL — AB (ref 65–99)
HEMATOCRIT: 43 % (ref 36.0–46.0)
Hemoglobin: 14.6 g/dL (ref 12.0–15.0)
Potassium: 4.3 mmol/L (ref 3.5–5.1)
Sodium: 140 mmol/L (ref 135–145)
TCO2: 29 mmol/L (ref 22–32)

## 2018-04-11 MED ORDER — OXYMETAZOLINE HCL 0.05 % NA SOLN
1.0000 | Freq: Once | NASAL | Status: AC
  Administered 2018-04-11: 1 via NASAL
  Filled 2018-04-11: qty 15

## 2018-04-11 MED ORDER — SODIUM CHLORIDE 0.9 % IV BOLUS
250.0000 mL | Freq: Once | INTRAVENOUS | Status: AC
  Administered 2018-04-11: 250 mL via INTRAVENOUS

## 2018-04-11 NOTE — Discharge Instructions (Addendum)
Follow up with dr. Janace Hoard Tuesday at 1:50pm.  If you have problems before your seen by Dr. Janace Hoard then you should go to [facility].  Stop taking your Eliquis until you see the ENT doctor

## 2018-04-11 NOTE — ED Provider Notes (Signed)
[facility] EMERGENCY DEPARTMENT Provider Note   CSN: [id number] Arrival date & time: 04/11/18  0557     History   Chief Complaint Chief Complaint  Patient presents with  . Epistaxis    HPI VERTIS BAUDER is a 84 y.o. female.  Patient presents to the emergency department for evaluation of nosebleed.  Patient reports that her nose started bleeding around 430 this morning.  But has been coming from the left side of her nose.  She denies trauma.  She is on Eliquis.  Patient has not had any headache, dizziness, chest pain, shortness of breath, heart palpitations, syncope.     Past Medical History:  Diagnosis Date  . Cataracts, bilateral   . Chronic systolic CHF (congestive heart failure) ([place])    a. dx 06/2016 - conservative rx recommended. EF 20% with biventricular failure in setting of PE.  . Demand ischemia ([place])   . Epistaxis   . Macular degeneration    Dry OD; wet OS  . Osteoporosis    07/18/16 previously on Premarin; S/P > 10 years Alendronate  . Paroxysmal atrial tachycardia ([place])    a. placed on amiodarone 06/2016.  . Pulmonary emboli ([place]) 06/2016    Patient Active Problem List   Diagnosis Date Noted  . Senile osteoporosis 07/18/2016  . DNR (do not resuscitate) discussion   . Palliative care encounter   . Epistaxis 07/07/2016  . Acute systolic CHF (congestive heart failure) ([place]) 07/05/2016  . Acute respiratory failure with hypoxia ([place]) 07/05/2016  . Secondary cardiomyopathy ([place])   . Biventricular failure ([place])   . Atrial tachycardia ([place])   . Paroxysmal atrial tachycardia ([place])   . Demand ischemia ([place])   . Acute pulmonary embolism ([place]) 07/03/2016  . COPD exacerbation ([place]) 07/03/2016  . Cardiomegaly 07/03/2016  . Coronary atherosclerosis of native coronary artery 07/03/2016  . Elevated troponin     Past Surgical History:  Procedure Laterality Date  . CATARACT EXTRACTION     bilaterally  . CHOLECYSTECTOMY    . TONSILLECTOMY       OB History      Gravida  0   Para      Term      Preterm      AB      Living        SAB      TAB      Ectopic      Multiple      Live Births               Home Medications    Prior to Admission medications   Medication Sig Start Date End Date Taking? Authorizing Provider  amiodarone (PACERONE) 200 MG tablet Take 0.5 tablets (100 mg total) by mouth daily. 11/27/17   Satira Sark, MD  apixaban (ELIQUIS) 5 MG TABS tablet Take 1 tablet (5 mg total) by mouth 2 (two) times daily. 07/15/16   Donne Hazel, MD  atorvastatin (LIPITOR) 40 MG tablet Take 1 tablet by mouth at bedtime. 02/18/18   [provider]  calcium carbonate (OSCAL) 1500 (600 Ca) MG TABS tablet Take 1,500 mg by mouth 2 (two) times daily with a meal.    [provider]  carvedilol (COREG) 3.125 MG tablet TAKE 1 TABLET BY MOUTH TWICE DAILY WITH A MEAL. 09/25/17   Lendon Colonel, NP  HYDROcodone-acetaminophen (HYCET) 7.5-325 mg/15 ml solution Take 15 mLs by mouth every 6 (six) hours as needed for moderate pain. 02/24/18  Davonna Belling, MD  HYDROcodone-acetaminophen (NORCO/VICODIN) 5-325 MG tablet Take 1-2 tablets by mouth every 4 (four) hours as needed. 02/24/18   Davonna Belling, MD  losartan (COZAAR) 25 MG tablet Take 0.5 tablets (12.5 mg total) by mouth daily. Patient not taking: Reported on 02/24/2018 07/15/16   Donne Hazel, MD  Multiple Vitamins-Minerals (ICAPS PO) Take 1 capsule by mouth 2 (two) times daily.    [provider]  spironolactone (ALDACTONE) 25 MG tablet Take 0.5 tablets (12.5 mg total) by mouth every other day. 03/27/17 02/24/18  Lendon Colonel, NP    Family History Family History  Problem Relation Age of Onset  . Heart disease Mother   . Stroke Mother   . Diabetes Father   . Cancer Sister        1 sister with metastatic bone cancer; 1 with breast cancer    Social History Social History   Tobacco Use  . Smoking status: Former Smoker    Types:  Cigarettes    Last attempt to quit: 1975    Years since quitting: 44.3  . Smokeless tobacco: Never Used  Substance Use Topics  . Alcohol use: No  . Drug use: No     Allergies   Codeine and Penicillins   Review of Systems Review of Systems  HENT: Positive for nosebleeds.   All other systems reviewed and are negative.    Physical Exam Updated Vital Signs BP 130/88 (BP Location: Right Arm)   Pulse 75   Resp 20   SpO2 94%   Physical Exam  Constitutional: She is oriented to person, place, and time. She appears well-developed and well-nourished. No distress.  HENT:  Head: Normocephalic and atraumatic.  Right Ear: Hearing normal.  Left Ear: Hearing normal.  Nose: Sinus tenderness (Diffuse bleeding from the anterior nasal septum) present.  Mouth/Throat: Oropharynx is clear and moist and mucous membranes are normal.  Eyes: Pupils are equal, round, and reactive to light. Conjunctivae and EOM are normal.  Neck: Normal range of motion. Neck supple.  Cardiovascular: Regular rhythm, S1 normal and S2 normal. Exam reveals no gallop and no friction rub.  No murmur heard. Pulmonary/Chest: Effort normal and breath sounds normal. No respiratory distress. She exhibits no tenderness.  Abdominal: Soft. Normal appearance and bowel sounds are normal. There is no hepatosplenomegaly. There is no tenderness. There is no rebound, no guarding, no tenderness at McBurney's point and negative Murphy's sign. No hernia.  Musculoskeletal: Normal range of motion.  Neurological: She is alert and oriented to person, place, and time. She has normal strength. No cranial nerve deficit or sensory deficit. Coordination normal. GCS eye subscore is 4. GCS verbal subscore is 5. GCS motor subscore is 6.  Skin: Skin is warm, dry and intact. No rash noted. No cyanosis.  Psychiatric: She has a normal mood and affect. Her speech is normal and behavior is normal. Thought content normal.  Nursing note and vitals  reviewed.    ED Treatments / Results  Labs (all labs ordered are listed, but only abnormal results are displayed) Labs Reviewed - No data to display  EKG None  Radiology No results found.  Procedures .Epistaxis Management Date/Time: 04/11/2018 6:08 AM Performed by: Orpah Greek, MD Authorized by: Orpah Greek, MD   Consent:    Consent obtained:  Verbal   Consent given by:  Patient   Risks discussed:  Pain Universal protocol:    Procedure explained and questions answered to patient or proxy's satisfaction: yes  Site/side marked: yes     Time out called: yes     Patient identity confirmed:  Verbally with patient Anesthesia (see MAR for exact dosages):    Anesthesia method:  None Procedure details:    Treatment site:  L anterior   Treatment method:  Nasal balloon   Treatment complexity:  Limited   Treatment episode: initial   Post-procedure details:    Assessment:  Bleeding stopped   Patient tolerance of procedure:  Tolerated well, no immediate complications   (including critical care time)  Medications Ordered in ED Medications - No data to display   Initial Impression / Assessment and Plan / ED Course  I have reviewed the triage vital signs and the nursing notes.  Pertinent labs & imaging results that were available during my care of the patient were reviewed by me and considered in my medical decision making (see chart for details).     Patient presents to the ER for evaluation of nosebleed.  She is on Eliquis.  Patient has diffuse bleeding from the left anterior nasal septum.  Because of her anticoagulation status and the multiple areas of bleeding, I did not feel that cauterization would be possible.  A nasal balloon was therefore placed and hemostasis was achieved.  Patient's vital signs are stable.  She did not have any significant amounts of bleeding prior to arrival, no blood work was necessary.  She will follow-up with ENT for packing  removal.  Final Clinical Impressions(s) / ED Diagnoses   Final diagnoses:  Left-sided epistaxis    ED Discharge Orders    None       Orpah Greek, MD 04/12/18 [phone]

## 2018-04-11 NOTE — ED Provider Notes (Signed)
Patient with a nosebleed and packing placed in her left nostril.  She was discharged home but before she got out of the emergency department she started bleeding again.  Patient had some blood running down her throat and out of her nasal area on the left side and right side.  I put a few more cc into the balloon for the nasal packing and suctioned out her throat.  She was given some Afrin also.  The bleeding was controlled.  Patient has an appointment with Dr. Janace Hoard on Tuesday and was told that she has problems before then to go to [facility]   Milton Ferguson, MD 04/11/18 1002

## 2018-04-11 NOTE — ED Triage Notes (Signed)
Pt in from home via [facility] for nosebleed that started approx 0430 from the left nare.  Pt is on eliquis

## 2018-04-11 NOTE — ED Notes (Signed)
Appt. Made with Dr. Melissa Montane, ENT for  Tues. April 23 at 1:50pm at [facility].  Nurse and EDP aware.

## 2018-04-11 NOTE — ED Notes (Signed)
Got pt out of bed, nose started to bleed again with the rhino rocket.  Pt holding pressure to nare.  MD zammit aware.

## 2018-04-11 NOTE — ED Notes (Signed)
Bleeding still controlled at this time.

## 2018-04-11 NOTE — ED Notes (Signed)
ED Provider at bedside. 

## 2018-04-16 ENCOUNTER — Telehealth: Payer: Self-pay | Admitting: Cardiology

## 2018-04-16 DIAGNOSIS — R040 Epistaxis: Secondary | ICD-10-CM | POA: Diagnosis not present

## 2018-04-16 MED ORDER — APIXABAN 2.5 MG PO TABS: 2.5000 mg | ORAL_TABLET | Freq: Two times a day (BID) | ORAL | 6 refills | Status: DC

## 2018-04-16 NOTE — Telephone Encounter (Signed)
Pt saw Dr Janace Hoard ENT today and was told by him to ask you when she can resume Eliquis. She has had no further epistaxis since the ED visit

## 2018-04-16 NOTE — Telephone Encounter (Signed)
If she did not receive any guidance from her ENT provider, I would think that she should be able to go ahead and start back on Eliquis if she has had no further bleeding and no other procedures are planned.  I would suggest however that she start on Eliquis 2.5 mg twice daily instead of 5 mg twice daily.

## 2018-04-16 NOTE — Telephone Encounter (Signed)
I spoke with patient, she understands why we are reducing her Eliquis dose to 2.5 mg BID, e-scribed to [place]

## 2018-04-16 NOTE — Telephone Encounter (Signed)
Pt had a severe nose bleed on Thursday of last week, wants to know when she should start back on her Eliquis, she's been off of it for 6 days per the ER Dr.

## 2018-04-25 ENCOUNTER — Other Ambulatory Visit: Payer: Self-pay | Admitting: Adult Health

## 2018-04-25 NOTE — Telephone Encounter (Signed)
refill 

## 2018-04-30 DIAGNOSIS — J069 Acute upper respiratory infection, unspecified: Secondary | ICD-10-CM | POA: Diagnosis not present

## 2018-04-30 DIAGNOSIS — Z6822 Body mass index (BMI) 22.0-22.9, adult: Secondary | ICD-10-CM | POA: Diagnosis not present

## 2018-05-08 ENCOUNTER — Emergency Department (HOSPITAL_COMMUNITY): Payer: No Typology Code available for payment source

## 2018-05-08 ENCOUNTER — Other Ambulatory Visit: Payer: Self-pay

## 2018-05-08 ENCOUNTER — Emergency Department (HOSPITAL_COMMUNITY)
Admission: EM | Admit: 2018-05-08 | Discharge: 2018-05-08 | Disposition: A | Discharge status: Home / Self Care | Payer: No Typology Code available for payment source | Attending: Emergency Medicine | Admitting: Emergency Medicine

## 2018-05-08 ENCOUNTER — Encounter (HOSPITAL_COMMUNITY): Payer: Self-pay | Admitting: Emergency Medicine

## 2018-05-08 DIAGNOSIS — S61512A Laceration without foreign body of left wrist, initial encounter: Secondary | ICD-10-CM | POA: Diagnosis not present

## 2018-05-08 DIAGNOSIS — S0990XA Unspecified injury of head, initial encounter: Secondary | ICD-10-CM | POA: Diagnosis not present

## 2018-05-08 DIAGNOSIS — Z7901 Long term (current) use of anticoagulants: Secondary | ICD-10-CM | POA: Diagnosis not present

## 2018-05-08 DIAGNOSIS — Z87891 Personal history of nicotine dependence: Secondary | ICD-10-CM | POA: Diagnosis not present

## 2018-05-08 DIAGNOSIS — J449 Chronic obstructive pulmonary disease, unspecified: Secondary | ICD-10-CM | POA: Diagnosis not present

## 2018-05-08 DIAGNOSIS — I2510 Atherosclerotic heart disease of native coronary artery without angina pectoris: Secondary | ICD-10-CM | POA: Insufficient documentation

## 2018-05-08 DIAGNOSIS — Y92410 Unspecified street and highway as the place of occurrence of the external cause: Secondary | ICD-10-CM | POA: Diagnosis not present

## 2018-05-08 DIAGNOSIS — Y999 Unspecified external cause status: Secondary | ICD-10-CM | POA: Insufficient documentation

## 2018-05-08 DIAGNOSIS — I5022 Chronic systolic (congestive) heart failure: Secondary | ICD-10-CM | POA: Insufficient documentation

## 2018-05-08 DIAGNOSIS — S6992XA Unspecified injury of left wrist, hand and finger(s), initial encounter: Secondary | ICD-10-CM | POA: Diagnosis present

## 2018-05-08 DIAGNOSIS — Y9389 Activity, other specified: Secondary | ICD-10-CM | POA: Diagnosis not present

## 2018-05-08 DIAGNOSIS — Z79899 Other long term (current) drug therapy: Secondary | ICD-10-CM | POA: Diagnosis not present

## 2018-05-08 MED ORDER — TETANUS-DIPHTH-ACELL PERTUSSIS 5-2.5-18.5 LF-MCG/0.5 IM SUSP
0.5000 mL | Freq: Once | INTRAMUSCULAR | Status: AC
  Administered 2018-05-08: 0.5 mL via INTRAMUSCULAR
  Filled 2018-05-08: qty 0.5
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
## 2018-05-08 NOTE — ED Notes (Signed)
Skin tear and bruising noted to wrist, no active bleeding at this time. Sensation and motor function intact. New dressing applied.

## 2018-05-22 DIAGNOSIS — S61512D Laceration without foreign body of left wrist, subsequent encounter: Secondary | ICD-10-CM | POA: Diagnosis not present

## 2018-05-22 DIAGNOSIS — Z1389 Encounter for screening for other disorder: Secondary | ICD-10-CM | POA: Diagnosis not present

## 2018-05-22 DIAGNOSIS — Z6821 Body mass index (BMI) 21.0-21.9, adult: Secondary | ICD-10-CM | POA: Diagnosis not present

## 2018-05-28 DIAGNOSIS — Z961 Presence of intraocular lens: Secondary | ICD-10-CM | POA: Diagnosis not present

## 2018-05-28 DIAGNOSIS — H353132 Nonexudative age-related macular degeneration, bilateral, intermediate dry stage: Secondary | ICD-10-CM | POA: Diagnosis not present

## 2018-05-30 DIAGNOSIS — X32XXXA Exposure to sunlight, initial encounter: Secondary | ICD-10-CM | POA: Diagnosis not present

## 2018-05-30 DIAGNOSIS — L570 Actinic keratosis: Secondary | ICD-10-CM | POA: Diagnosis not present

## 2018-05-30 DIAGNOSIS — C44319 Basal cell carcinoma of skin of other parts of face: Secondary | ICD-10-CM | POA: Diagnosis not present

## 2018-06-04 NOTE — Progress Notes (Signed)
Cardiology Office Note  Date: 06/05/2018   ID: Miranda Ford, DOB 1924/01/14, MRN [id number]  PCP: Sharilyn Sites, MD  Primary Cardiologist: Rozann Lesches, MD   Chief Complaint  Patient presents with  . Cardiac follow-up    History of Present Illness: Miranda Ford is a 84 y.o. female last seen in December 2018.  She is here today with a family member.  Reports no palpitations or chest pain.  She has had some nosebleeds which resolved, now on lower dose Eliquis which has been followed by Dr. Hilma Favors for history of pulmonary embolus.  At the last visit we reduced amiodarone dose.  She has a history of atrial tachycardia that has been well controlled.  We discussed arranging follow-up LFTs and TSH.  She has a history of suspected nonischemic cardiomyopathy that has been managed conservatively.  No obvious fluid excess, orthopnea, leg swelling.  She is on Aldactone and Coreg.  Past Medical History:  Diagnosis Date  . Cataracts, bilateral   . Chronic systolic CHF (congestive heart failure) ([place])    a. dx 06/2016 - conservative rx recommended. EF 20% with biventricular failure in setting of PE.  . Demand ischemia ([place])   . Epistaxis   . Macular degeneration    Dry OD; wet OS  . Osteoporosis    07/18/16 previously on Premarin; S/P > 10 years Alendronate  . Paroxysmal atrial tachycardia ([place])    a. placed on amiodarone 06/2016.  . Pulmonary emboli ([place]) 06/2016    Past Surgical History:  Procedure Laterality Date  . CATARACT EXTRACTION     bilaterally  . CHOLECYSTECTOMY    . TONSILLECTOMY      Current Outpatient Medications  Medication Sig Dispense Refill  . amiodarone (PACERONE) 200 MG tablet Take 0.5 tablets (100 mg total) by mouth daily. 45 tablet 3  . apixaban (ELIQUIS) 2.5 MG TABS tablet Take 1 tablet (2.5 mg total) by mouth 2 (two) times daily. 60 tablet 6  . atorvastatin (LIPITOR) 40 MG tablet Take 1 tablet by mouth at bedtime.    . calcium carbonate (OSCAL) 1500  (600 Ca) MG TABS tablet Take 1,500 mg by mouth 2 (two) times daily with a meal.    . carvedilol (COREG) 3.125 MG tablet TAKE 1 TABLET BY MOUTH TWICE DAILY WITH A MEAL. 60 tablet 5  . Multiple Vitamins-Minerals (ICAPS PO) Take 1 capsule by mouth 2 (two) times daily.    Marland Kitchen spironolactone (ALDACTONE) 25 MG tablet Take 0.5 tablets (12.5 mg total) by mouth every other day. 45 tablet 3   No current facility-administered medications for this visit.    Allergies:  Codeine and Penicillins   Social History: The patient  reports that she quit smoking about 44 years ago. Her smoking use included cigarettes. She has never used smokeless tobacco. She reports that she does not drink alcohol or use drugs.   ROS:  Please see the history of present illness. Otherwise, complete review of systems is positive for none.  All other systems are reviewed and negative.   Physical Exam: VS:  BP (!) 144/68   Pulse (!) 52   Ht 5\' 3"  (1.6 m)   Wt 124 lb 6.4 oz (56.4 kg)   SpO2 94% Comment: on room air  BMI 22.04 kg/m , BMI Body mass index is 22.04 kg/m.  Wt Readings from Last 3 Encounters:  06/05/18 124 lb 6.4 oz (56.4 kg)  05/08/18 130 lb (59 kg)  02/24/18 124 lb (56.2 kg)  General: Elderly woman, appears comfortable at rest. HEENT: Conjunctiva and lids normal, oropharynx clear. Neck: Supple, no elevated JVP or carotid bruits, no thyromegaly. Lungs: Clear to auscultation, nonlabored breathing at rest. Cardiac: Regular rate and rhythm, no S3 or significant systolic murmur, no pericardial rub. Abdomen: Soft, nontender, bowel sounds present. Extremities: No pitting edema, distal pulses 2+. Skin: Warm and dry. Musculoskeletal: No kyphosis. Neuropsychiatric: Alert and oriented x3, affect grossly appropriate.  ECG: I personally reviewed the tracing from 11/27/2017 which showed sinus rhythm with right bundle branch block and rightward axis.  Recent Labwork: 04/11/2018: BUN 29; Creatinine, Ser 1.10; Hemoglobin  14.6; Potassium 4.3; Sodium 140  September 2018: Cholesterol 133, triglycerides 49, HDL 72, LDL 51, AST 41, ALT 44, TSH 2.38  Other Studies Reviewed Today:  Echocardiogram 09/19/2016: Study Conclusions  - Left ventricle: The cavity size was mildly dilated. Wall   thickness was normal. The estimated ejection fraction was 25%.   Diffuse hypokinesis. Doppler parameters are consistent with   abnormal left ventricular relaxation (grade 1 diastolic   dysfunction). Doppler parameters are consistent with high   ventricular filling pressure. - Aortic valve: Probably mild to moderate aortic stenosis vs   pseudo-stenosis due to reduced left ventricular output.   Moderately thickened, moderately calcified leaflets. There was   mild regurgitation. Peak velocity (S): 195 cm/s. Mean gradient   (S): 7 mm Hg. Valve area (VTI): 1.41 cm^2. Valve area (Vmax):   1.46 cm^2. Valve area (Vmean): 1.58 cm^2. - Aorta: Aortic root at upper normal limits in diameter. Aortic   root dimension: 39 mm (ED). - Mitral valve: Calcified annulus. Mildly thickened, mildly   calcified leaflets . There was mild regurgitation. - Left atrium: The atrium was moderately dilated. - Right ventricle: Systolic function was mildly reduced. - Tricuspid valve: There was moderate regurgitation. - Pulmonary arteries: PA peak pressure: 44 mm Hg (S). - Inferior vena cava: The vessel was severely dilated. The   respirophasic diameter changes were blunted (< 50%), consistent   with elevated central venous pressure.  Assessment and Plan:  1.  Paroxysmal atrial tachycardia, no obvious palpitations or breakthrough arrhythmias on amiodarone which we reduced to 200 mg daily at the last visit.  Check LFTs and TSH.  Might be reasonable to stop the medication altogether.  2.  History of pulmonary embolus, has been kept on long-term Eliquis by Dr. Hilma Favors.  Dose reduced to 2.5 mg twice daily.  3.  Probable nonischemic cardiomyopathy with LVEF  approximately 25% as of 2017.  This has been managed conservatively.  She is doing well on current treatment.  Addition of low-dose ARB could be considered, although blood pressure does tend to run low at times.   Current medicines were reviewed with the patient today.   Orders Placed This Encounter  Procedures  . TSH  . Comprehensive metabolic panel    Disposition: Follow-up in 6 months.  Signed, Satira Sark, MD, [facility] 06/05/2018 1:13 PM    [facility] [address], [place], [place] [address] Phone: [phone]; Fax: [phone]

## 2018-06-05 ENCOUNTER — Encounter: Payer: Self-pay | Admitting: Cardiology

## 2018-06-05 ENCOUNTER — Ambulatory Visit (INDEPENDENT_AMBULATORY_CARE_PROVIDER_SITE_OTHER): Payer: Medicare Other | Admitting: Cardiology

## 2018-06-05 ENCOUNTER — Telehealth: Payer: Self-pay | Admitting: *Deleted

## 2018-06-05 ENCOUNTER — Other Ambulatory Visit (HOSPITAL_COMMUNITY)
Admission: RE | Admit: 2018-06-05 | Discharge: 2018-06-05 | Disposition: A | Discharge status: Home / Self Care | Payer: Medicare Other | Source: Ambulatory Visit | Attending: Cardiology | Admitting: Cardiology

## 2018-06-05 VITALS — BP 144/68 | HR 52 | Ht 63.0 in | Wt 124.4 lb

## 2018-06-05 DIAGNOSIS — I471 Supraventricular tachycardia: Secondary | ICD-10-CM | POA: Diagnosis not present

## 2018-06-05 DIAGNOSIS — Z79899 Other long term (current) drug therapy: Secondary | ICD-10-CM

## 2018-06-05 DIAGNOSIS — Z86711 Personal history of pulmonary embolism: Secondary | ICD-10-CM

## 2018-06-05 DIAGNOSIS — I429 Cardiomyopathy, unspecified: Secondary | ICD-10-CM | POA: Diagnosis not present

## 2018-06-05 LAB — COMPREHENSIVE METABOLIC PANEL
ALBUMIN: 3.9 g/dL (ref 3.5–5.0)
ALK PHOS: 95 U/L (ref 38–126)
ALT: 24 U/L (ref 14–54)
AST: 29 U/L (ref 15–41)
Anion gap: 7 (ref 5–15)
BILIRUBIN TOTAL: 0.7 mg/dL (ref 0.3–1.2)
BUN: 20 mg/dL (ref 6–20)
CO2: 32 mmol/L (ref 22–32)
Calcium: 9.3 mg/dL (ref 8.9–10.3)
Chloride: 99 mmol/L — ABNORMAL LOW (ref 101–111)
Creatinine, Ser: 1.13 mg/dL — ABNORMAL HIGH (ref 0.44–1.00)
GFR calc Af Amer: 48 mL/min — ABNORMAL LOW (ref >60)
GFR calc non Af Amer: 41 mL/min — ABNORMAL LOW (ref >60)
GLUCOSE: 125 mg/dL — AB (ref 65–99)
POTASSIUM: 4.6 mmol/L (ref 3.5–5.1)
Sodium: 138 mmol/L (ref 135–145)
TOTAL PROTEIN: 6.8 g/dL (ref 6.5–8.1)

## 2018-06-05 LAB — TSH: TSH: 1.998 u[IU]/mL (ref 0.350–4.500)

## 2018-06-05 NOTE — Telephone Encounter (Signed)
Patient informed and copy sent to PCP. 

## 2018-06-05 NOTE — Patient Instructions (Signed)
Medication Instructions:   Your physician recommends that you continue on your current medications as directed. Please refer to the Current Medication list given to you today.  Labwork:  Your physician recommends that you return for lab work in: TODAY to check your CMET & TSH.  Testing/Procedures:  NONE  Follow-Up:  Your physician recommends that you schedule a follow-up appointment in: 6 months. You will receive a reminder letter in the mail in about 4 months reminding you to call and schedule your appointment. If you don't receive this letter, please contact our office.  Any Other Special Instructions Will Be Listed Below (If Applicable).  If you need a refill on your cardiac medications before your next appointment, please call your pharmacy.

## 2018-06-05 NOTE — Telephone Encounter (Signed)
-----   Message from Satira Sark, MD sent at 06/05/2018  3:07 PM EDT ----- Results reviewed.  TSH normal, LFTs in similar range with no substantial change over the last 2 years.  Continue with low-dose amiodarone. A copy of this test should be forwarded to Sharilyn Sites, MD.

## 2018-07-04 DIAGNOSIS — Z08 Encounter for follow-up examination after completed treatment for malignant neoplasm: Secondary | ICD-10-CM | POA: Diagnosis not present

## 2018-07-04 DIAGNOSIS — Z85828 Personal history of other malignant neoplasm of skin: Secondary | ICD-10-CM | POA: Diagnosis not present

## 2018-08-15 DIAGNOSIS — H04123 Dry eye syndrome of bilateral lacrimal glands: Secondary | ICD-10-CM | POA: Diagnosis not present

## 2018-08-15 DIAGNOSIS — R6889 Other general symptoms and signs: Secondary | ICD-10-CM | POA: Diagnosis not present

## 2018-10-11 DIAGNOSIS — Z23 Encounter for immunization: Secondary | ICD-10-CM | POA: Diagnosis not present

## 2018-10-17 ENCOUNTER — Other Ambulatory Visit: Payer: Self-pay | Admitting: Cardiology

## 2018-10-22 ENCOUNTER — Telehealth: Payer: Self-pay | Admitting: Cardiology

## 2018-10-22 MED ORDER — ATORVASTATIN CALCIUM 40 MG PO TABS: 40.0000 mg | ORAL_TABLET | Freq: Every day | ORAL | 3 refills | Status: DC

## 2018-10-22 NOTE — Telephone Encounter (Signed)
Patient needs refill on Atorvastatin sent to pharmacy/tg

## 2018-10-22 NOTE — Telephone Encounter (Signed)
Done

## 2018-11-09 ENCOUNTER — Other Ambulatory Visit: Payer: Self-pay | Admitting: Cardiology

## 2018-11-11 DIAGNOSIS — Z1389 Encounter for screening for other disorder: Secondary | ICD-10-CM | POA: Diagnosis not present

## 2018-11-11 DIAGNOSIS — Z0001 Encounter for general adult medical examination with abnormal findings: Secondary | ICD-10-CM | POA: Diagnosis not present

## 2018-11-11 DIAGNOSIS — Z6841 Body Mass Index (BMI) 40.0 and over, adult: Secondary | ICD-10-CM | POA: Diagnosis not present

## 2018-12-02 ENCOUNTER — Encounter: Payer: Self-pay | Admitting: Cardiology

## 2018-12-02 ENCOUNTER — Ambulatory Visit (INDEPENDENT_AMBULATORY_CARE_PROVIDER_SITE_OTHER): Payer: Medicare Other | Admitting: Cardiology

## 2018-12-02 VITALS — BP 138/82 | HR 61 | Ht 63.0 in | Wt 125.0 lb

## 2018-12-02 DIAGNOSIS — I429 Cardiomyopathy, unspecified: Secondary | ICD-10-CM

## 2018-12-02 DIAGNOSIS — Z86711 Personal history of pulmonary embolism: Secondary | ICD-10-CM

## 2018-12-02 DIAGNOSIS — I471 Supraventricular tachycardia: Secondary | ICD-10-CM | POA: Diagnosis not present

## 2018-12-02 NOTE — Patient Instructions (Addendum)
Medication Instructions:   Your physician recommends that you continue on your current medications as directed. Please refer to the Current Medication list given to you today.  Labwork:  Your physician recommends that you return for a FASTING lipid/liver profile: in 6 months just before your next visit. Please request your lab order in 4 months when you call to schedule your appointment.  Testing/Procedures:  NONE  Follow-Up:  Your physician recommends that you schedule a follow-up appointment in: 6 months. You will receive a reminder letter in the mail in about 4 months reminding you to call and schedule your appointment. If you don't receive this letter, please contact our office.  Any Other Special Instructions Will Be Listed Below (If Applicable).  If you need a refill on your cardiac medications before your next appointment, please call your pharmacy.

## 2018-12-02 NOTE — Progress Notes (Signed)
Cardiology Office Note  Date: 12/02/2018   ID: Miranda Ford, DOB 11-07-1924, MRN [id number]  PCP: Sharilyn Sites, MD  Primary Cardiologist: Rozann Lesches, MD   Chief Complaint  Patient presents with  . Cardiac follow-up    History of Present Illness: Miranda Ford is a 84 y.o. female last seen in June.  She is here for a follow-up visit with her niece.  She does not report any chest pain or palpitations.  Remains functional with basic ADLs. She paces herself but has actually been doing some raking of leaves recently.  She remains on low-dose amiodarone along with Coreg.  LFTs and TSH were normal in June.  We will plan to repeat lab work for her next visit.  I personally reviewed her ECG today which shows a sinus bradycardia with right bundle branch block.  Past Medical History:  Diagnosis Date  . Cataracts, bilateral   . Chronic systolic CHF (congestive heart failure) ([place])    a. dx 06/2016 - conservative rx recommended. EF 20% with biventricular failure in setting of PE.  . Demand ischemia ([place])   . Epistaxis   . Macular degeneration    Dry OD; wet OS  . Osteoporosis    07/18/16 previously on Premarin; S/P > 10 years Alendronate  . Paroxysmal atrial tachycardia ([place])    a. placed on amiodarone 06/2016.  . Pulmonary emboli ([place]) 06/2016    Past Surgical History:  Procedure Laterality Date  . CATARACT EXTRACTION     bilaterally  . CHOLECYSTECTOMY    . TONSILLECTOMY      Current Outpatient Medications  Medication Sig Dispense Refill  . amiodarone (PACERONE) 200 MG tablet TAKE (1/2) TABLET BY MOUTH ONCE DAILY. 45 tablet 3  . atorvastatin (LIPITOR) 40 MG tablet Take 1 tablet (40 mg total) by mouth at bedtime. 90 tablet 3  . calcium carbonate (OSCAL) 1500 (600 Ca) MG TABS tablet Take 1,500 mg by mouth 2 (two) times daily with a meal.    . carvedilol (COREG) 3.125 MG tablet TAKE 1 TABLET BY MOUTH TWICE DAILY WITH A MEAL. 60 tablet 5  . ELIQUIS 2.5 MG TABS tablet TAKE  1 TABLET BY MOUTH AT 10AM AND 1 TABLET AT 9PM. 60 tablet 0  . Multiple Vitamins-Minerals (ICAPS PO) Take 1 capsule by mouth 2 (two) times daily.    Marland Kitchen spironolactone (ALDACTONE) 25 MG tablet Take 0.5 tablets (12.5 mg total) by mouth every other day. (Patient taking differently: Take 12.5 mg by mouth daily. ) 45 tablet 3   No current facility-administered medications for this visit.    Allergies:  Codeine and Penicillins   Social History: The patient  reports that she quit smoking about 44 years ago. Her smoking use included cigarettes. She has never used smokeless tobacco. She reports that she does not drink alcohol or use drugs.   ROS:  Please see the history of present illness. Otherwise, complete review of systems is positive for hearing loss.  All other systems are reviewed and negative.   Physical Exam: VS:  BP 138/82   Pulse 61   Ht 5\' 3"  (1.6 m)   Wt 125 lb (56.7 kg)   SpO2 94%   BMI 22.14 kg/m , BMI Body mass index is 22.14 kg/m.  Wt Readings from Last 3 Encounters:  12/02/18 125 lb (56.7 kg)  06/05/18 124 lb 6.4 oz (56.4 kg)  05/08/18 130 lb (59 kg)    General: Elderly woman, appears comfortable at rest. HEENT:  Conjunctiva and lids normal, oropharynx clear. Neck: Supple, no elevated JVP or carotid bruits, no thyromegaly. Lungs: Clear to auscultation, nonlabored breathing at rest. Cardiac: Regular rate and rhythm, no S3, soft systolic murmur. Abdomen: Soft, nontender, bowel sounds present. Extremities: No pitting edema, distal pulses 2+. Skin: Warm and dry. Musculoskeletal: No kyphosis. Neuropsychiatric: Alert and oriented x3, affect grossly appropriate.  ECG: I personally reviewed the tracing from 11/27/2017 which showed sinus rhythm with right bundle branch block and rightward axis.  Recent Labwork: 04/11/2018: Hemoglobin 14.6 06/05/2018: ALT 24; AST 29; BUN 20; Creatinine, Ser 1.13; Potassium 4.6; Sodium 138; TSH 1.998   Other Studies Reviewed  Today:  Echocardiogram 09/19/2016: Study Conclusions  - Left ventricle: The cavity size was mildly dilated. Wall thickness was normal. The estimated ejection fraction was 25%. Diffuse hypokinesis. Doppler parameters are consistent with abnormal left ventricular relaxation (grade 1 diastolic dysfunction). Doppler parameters are consistent with high ventricular filling pressure. - Aortic valve: Probably mild to moderate aortic stenosis vs pseudo-stenosis due to reduced left ventricular output. Moderately thickened, moderately calcified leaflets. There was mild regurgitation. Peak velocity (S): 195 cm/s. Mean gradient (S): 7 mm Hg. Valve area (VTI): 1.41 cm^2. Valve area (Vmax): 1.46 cm^2. Valve area (Vmean): 1.58 cm^2. - Aorta: Aortic root at upper normal limits in diameter. Aortic root dimension: 39 mm (ED). - Mitral valve: Calcified annulus. Mildly thickened, mildly calcified leaflets . There was mild regurgitation. - Left atrium: The atrium was moderately dilated. - Right ventricle: Systolic function was mildly reduced. - Tricuspid valve: There was moderate regurgitation. - Pulmonary arteries: PA peak pressure: 44 mm Hg (S). - Inferior vena cava: The vessel was severely dilated. The respirophasic diameter changes were blunted (<50%), consistent with elevated central venous pressure.  Assessment and Plan:  1.  Paroxysmal atrial tachycardia.  She has done well on combination of low-dose amiodarone and Coreg.  Follow-up LFTs and TSH for next visit.  2.  History of pulmonary embolus, continues on long-term Eliquis per Dr. Hilma Favors.  No reported bleeding episodes.  3.  Nonischemic cardiomyopathy with LVEF approximately 25%.  We are following this conservatively, she has not undergone recent imaging studies.  Current medicines were reviewed with the patient today.   Orders Placed This Encounter  Procedures  . EKG 12-Lead    Disposition: Follow-up in  6 months.  Signed, Satira Sark, MD, [facility] 12/02/2018 11:47 AM    [facility]. [address] [place], [place] [address] Phone: [phone]; Fax: [phone]

## 2018-12-09 ENCOUNTER — Other Ambulatory Visit: Payer: Self-pay | Admitting: Cardiology

## 2019-01-07 ENCOUNTER — Other Ambulatory Visit: Payer: Self-pay | Admitting: Cardiology

## 2019-01-08 DIAGNOSIS — H353113 Nonexudative age-related macular degeneration, right eye, advanced atrophic without subfoveal involvement: Secondary | ICD-10-CM | POA: Diagnosis not present

## 2019-01-08 DIAGNOSIS — H353124 Nonexudative age-related macular degeneration, left eye, advanced atrophic with subfoveal involvement: Secondary | ICD-10-CM | POA: Diagnosis not present

## 2019-01-08 DIAGNOSIS — H43811 Vitreous degeneration, right eye: Secondary | ICD-10-CM | POA: Diagnosis not present

## 2019-01-08 DIAGNOSIS — H43812 Vitreous degeneration, left eye: Secondary | ICD-10-CM | POA: Diagnosis not present

## 2019-02-06 ENCOUNTER — Other Ambulatory Visit: Payer: Self-pay | Admitting: Cardiology

## 2019-03-11 ENCOUNTER — Other Ambulatory Visit: Payer: Self-pay | Admitting: Cardiology

## 2019-04-08 ENCOUNTER — Other Ambulatory Visit: Payer: Self-pay | Admitting: Cardiology

## 2019-05-07 ENCOUNTER — Other Ambulatory Visit: Payer: Self-pay | Admitting: Cardiology

## 2019-06-03 DIAGNOSIS — H353132 Nonexudative age-related macular degeneration, bilateral, intermediate dry stage: Secondary | ICD-10-CM | POA: Diagnosis not present

## 2019-06-03 DIAGNOSIS — Z961 Presence of intraocular lens: Secondary | ICD-10-CM | POA: Diagnosis not present

## 2019-07-10 ENCOUNTER — Inpatient Hospital Stay (HOSPITAL_COMMUNITY)
Admission: EM | Admit: 2019-07-10 | Discharge: 2019-07-14 | DRG: 481 | Disposition: A | Discharge status: Skilled Nursing Facility | Payer: Medicare Other | Attending: Internal Medicine | Admitting: Internal Medicine

## 2019-07-10 ENCOUNTER — Emergency Department (HOSPITAL_COMMUNITY): Payer: Medicare Other

## 2019-07-10 ENCOUNTER — Other Ambulatory Visit: Payer: Self-pay

## 2019-07-10 ENCOUNTER — Encounter (HOSPITAL_COMMUNITY): Payer: Self-pay | Admitting: Emergency Medicine

## 2019-07-10 DIAGNOSIS — M6281 Muscle weakness (generalized): Secondary | ICD-10-CM | POA: Diagnosis not present

## 2019-07-10 DIAGNOSIS — I2510 Atherosclerotic heart disease of native coronary artery without angina pectoris: Secondary | ICD-10-CM | POA: Diagnosis present

## 2019-07-10 DIAGNOSIS — N183 Chronic kidney disease, stage 3 (moderate): Secondary | ICD-10-CM | POA: Diagnosis present

## 2019-07-10 DIAGNOSIS — Z8249 Family history of ischemic heart disease and other diseases of the circulatory system: Secondary | ICD-10-CM | POA: Diagnosis not present

## 2019-07-10 DIAGNOSIS — Z9842 Cataract extraction status, left eye: Secondary | ICD-10-CM

## 2019-07-10 DIAGNOSIS — K5900 Constipation, unspecified: Secondary | ICD-10-CM | POA: Diagnosis present

## 2019-07-10 DIAGNOSIS — Z86711 Personal history of pulmonary embolism: Secondary | ICD-10-CM

## 2019-07-10 DIAGNOSIS — S42212A Unspecified displaced fracture of surgical neck of left humerus, initial encounter for closed fracture: Secondary | ICD-10-CM | POA: Diagnosis not present

## 2019-07-10 DIAGNOSIS — R262 Difficulty in walking, not elsewhere classified: Secondary | ICD-10-CM | POA: Diagnosis not present

## 2019-07-10 DIAGNOSIS — N179 Acute kidney failure, unspecified: Secondary | ICD-10-CM | POA: Diagnosis present

## 2019-07-10 DIAGNOSIS — Z209 Contact with and (suspected) exposure to unspecified communicable disease: Secondary | ICD-10-CM | POA: Diagnosis not present

## 2019-07-10 DIAGNOSIS — I428 Other cardiomyopathies: Secondary | ICD-10-CM | POA: Diagnosis not present

## 2019-07-10 DIAGNOSIS — Z419 Encounter for procedure for purposes other than remedying health state, unspecified: Secondary | ICD-10-CM

## 2019-07-10 DIAGNOSIS — M160 Bilateral primary osteoarthritis of hip: Secondary | ICD-10-CM | POA: Diagnosis not present

## 2019-07-10 DIAGNOSIS — Z03818 Encounter for observation for suspected exposure to other biological agents ruled out: Secondary | ICD-10-CM | POA: Diagnosis not present

## 2019-07-10 DIAGNOSIS — Z9049 Acquired absence of other specified parts of digestive tract: Secondary | ICD-10-CM | POA: Diagnosis not present

## 2019-07-10 DIAGNOSIS — S299XXA Unspecified injury of thorax, initial encounter: Secondary | ICD-10-CM | POA: Diagnosis not present

## 2019-07-10 DIAGNOSIS — R001 Bradycardia, unspecified: Secondary | ICD-10-CM | POA: Insufficient documentation

## 2019-07-10 DIAGNOSIS — I10 Essential (primary) hypertension: Secondary | ICD-10-CM | POA: Diagnosis not present

## 2019-07-10 DIAGNOSIS — Z823 Family history of stroke: Secondary | ICD-10-CM | POA: Diagnosis not present

## 2019-07-10 DIAGNOSIS — D696 Thrombocytopenia, unspecified: Secondary | ICD-10-CM | POA: Diagnosis present

## 2019-07-10 DIAGNOSIS — Z803 Family history of malignant neoplasm of breast: Secondary | ICD-10-CM

## 2019-07-10 DIAGNOSIS — I5022 Chronic systolic (congestive) heart failure: Secondary | ICD-10-CM | POA: Diagnosis present

## 2019-07-10 DIAGNOSIS — Z885 Allergy status to narcotic agent status: Secondary | ICD-10-CM

## 2019-07-10 DIAGNOSIS — W19XXXD Unspecified fall, subsequent encounter: Secondary | ICD-10-CM | POA: Diagnosis not present

## 2019-07-10 DIAGNOSIS — Y92007 Garden or yard of unspecified non-institutional (private) residence as the place of occurrence of the external cause: Secondary | ICD-10-CM | POA: Diagnosis not present

## 2019-07-10 DIAGNOSIS — I471 Supraventricular tachycardia: Secondary | ICD-10-CM | POA: Diagnosis present

## 2019-07-10 DIAGNOSIS — Z880 Allergy status to penicillin: Secondary | ICD-10-CM

## 2019-07-10 DIAGNOSIS — E8889 Other specified metabolic disorders: Secondary | ICD-10-CM | POA: Diagnosis present

## 2019-07-10 DIAGNOSIS — Z87891 Personal history of nicotine dependence: Secondary | ICD-10-CM

## 2019-07-10 DIAGNOSIS — S42215A Unspecified nondisplaced fracture of surgical neck of left humerus, initial encounter for closed fracture: Secondary | ICD-10-CM | POA: Diagnosis not present

## 2019-07-10 DIAGNOSIS — H25813 Combined forms of age-related cataract, bilateral: Secondary | ICD-10-CM | POA: Diagnosis not present

## 2019-07-10 DIAGNOSIS — Z79899 Other long term (current) drug therapy: Secondary | ICD-10-CM

## 2019-07-10 DIAGNOSIS — M2550 Pain in unspecified joint: Secondary | ICD-10-CM | POA: Diagnosis not present

## 2019-07-10 DIAGNOSIS — S72002A Fracture of unspecified part of neck of left femur, initial encounter for closed fracture: Secondary | ICD-10-CM | POA: Diagnosis not present

## 2019-07-10 DIAGNOSIS — S42302D Unspecified fracture of shaft of humerus, left arm, subsequent encounter for fracture with routine healing: Secondary | ICD-10-CM | POA: Diagnosis not present

## 2019-07-10 DIAGNOSIS — Z7901 Long term (current) use of anticoagulants: Secondary | ICD-10-CM | POA: Diagnosis not present

## 2019-07-10 DIAGNOSIS — S42202A Unspecified fracture of upper end of left humerus, initial encounter for closed fracture: Secondary | ICD-10-CM

## 2019-07-10 DIAGNOSIS — M80852A Other osteoporosis with current pathological fracture, left femur, initial encounter for fracture: Principal | ICD-10-CM | POA: Diagnosis present

## 2019-07-10 DIAGNOSIS — J449 Chronic obstructive pulmonary disease, unspecified: Secondary | ICD-10-CM | POA: Diagnosis present

## 2019-07-10 DIAGNOSIS — R0902 Hypoxemia: Secondary | ICD-10-CM | POA: Diagnosis not present

## 2019-07-10 DIAGNOSIS — S72142A Displaced intertrochanteric fracture of left femur, initial encounter for closed fracture: Secondary | ICD-10-CM | POA: Diagnosis not present

## 2019-07-10 DIAGNOSIS — R5381 Other malaise: Secondary | ICD-10-CM | POA: Diagnosis not present

## 2019-07-10 DIAGNOSIS — S7292XD Unspecified fracture of left femur, subsequent encounter for closed fracture with routine healing: Secondary | ICD-10-CM | POA: Diagnosis not present

## 2019-07-10 DIAGNOSIS — Z1159 Encounter for screening for other viral diseases: Secondary | ICD-10-CM | POA: Diagnosis not present

## 2019-07-10 DIAGNOSIS — I480 Paroxysmal atrial fibrillation: Secondary | ICD-10-CM | POA: Diagnosis present

## 2019-07-10 DIAGNOSIS — Z66 Do not resuscitate: Secondary | ICD-10-CM | POA: Diagnosis present

## 2019-07-10 DIAGNOSIS — Z4789 Encounter for other orthopedic aftercare: Secondary | ICD-10-CM | POA: Diagnosis not present

## 2019-07-10 DIAGNOSIS — J9811 Atelectasis: Secondary | ICD-10-CM | POA: Diagnosis present

## 2019-07-10 DIAGNOSIS — H3530 Unspecified macular degeneration: Secondary | ICD-10-CM | POA: Diagnosis not present

## 2019-07-10 DIAGNOSIS — J441 Chronic obstructive pulmonary disease with (acute) exacerbation: Secondary | ICD-10-CM | POA: Diagnosis not present

## 2019-07-10 DIAGNOSIS — M810 Age-related osteoporosis without current pathological fracture: Secondary | ICD-10-CM | POA: Diagnosis present

## 2019-07-10 DIAGNOSIS — R040 Epistaxis: Secondary | ICD-10-CM | POA: Diagnosis not present

## 2019-07-10 DIAGNOSIS — W010XXA Fall on same level from slipping, tripping and stumbling without subsequent striking against object, initial encounter: Secondary | ICD-10-CM | POA: Diagnosis present

## 2019-07-10 DIAGNOSIS — M80822A Other osteoporosis with current pathological fracture, left humerus, initial encounter for fracture: Secondary | ICD-10-CM | POA: Diagnosis present

## 2019-07-10 DIAGNOSIS — S72009A Fracture of unspecified part of neck of unspecified femur, initial encounter for closed fracture: Secondary | ICD-10-CM

## 2019-07-10 DIAGNOSIS — Z7401 Bed confinement status: Secondary | ICD-10-CM | POA: Diagnosis not present

## 2019-07-10 DIAGNOSIS — W19XXXA Unspecified fall, initial encounter: Secondary | ICD-10-CM | POA: Diagnosis not present

## 2019-07-10 DIAGNOSIS — N189 Chronic kidney disease, unspecified: Secondary | ICD-10-CM | POA: Diagnosis not present

## 2019-07-10 DIAGNOSIS — R000 Tachycardia, unspecified: Secondary | ICD-10-CM | POA: Diagnosis not present

## 2019-07-10 DIAGNOSIS — S72002D Fracture of unspecified part of neck of left femur, subsequent encounter for closed fracture with routine healing: Secondary | ICD-10-CM | POA: Diagnosis not present

## 2019-07-10 DIAGNOSIS — Z833 Family history of diabetes mellitus: Secondary | ICD-10-CM

## 2019-07-10 DIAGNOSIS — R52 Pain, unspecified: Secondary | ICD-10-CM | POA: Diagnosis not present

## 2019-07-10 DIAGNOSIS — I959 Hypotension, unspecified: Secondary | ICD-10-CM | POA: Diagnosis not present

## 2019-07-10 DIAGNOSIS — E785 Hyperlipidemia, unspecified: Secondary | ICD-10-CM | POA: Diagnosis not present

## 2019-07-10 DIAGNOSIS — M25519 Pain in unspecified shoulder: Secondary | ICD-10-CM | POA: Diagnosis not present

## 2019-07-10 DIAGNOSIS — Z9841 Cataract extraction status, right eye: Secondary | ICD-10-CM | POA: Diagnosis not present

## 2019-07-10 DIAGNOSIS — D62 Acute posthemorrhagic anemia: Secondary | ICD-10-CM | POA: Diagnosis not present

## 2019-07-10 LAB — CBC WITH DIFFERENTIAL/PLATELET
Abs Immature Granulocytes: 0.03 10*3/uL (ref 0.00–0.07)
Basophils Absolute: 0 10*3/uL (ref 0.0–0.1)
Basophils Relative: 0 %
Eosinophils Absolute: 0.1 10*3/uL (ref 0.0–0.5)
Eosinophils Relative: 1 %
HCT: 39.7 % (ref 36.0–46.0)
Hemoglobin: 12.8 g/dL (ref 12.0–15.0)
Immature Granulocytes: 0 %
Lymphocytes Relative: 14 %
Lymphs Abs: 1.2 10*3/uL (ref 0.7–4.0)
MCH: 32 pg (ref 26.0–34.0)
MCHC: 32.2 g/dL (ref 30.0–36.0)
MCV: 99.3 fL (ref 80.0–100.0)
Monocytes Absolute: 0.6 10*3/uL (ref 0.1–1.0)
Monocytes Relative: 7 %
Neutro Abs: 7 10*3/uL (ref 1.7–7.7)
Neutrophils Relative %: 78 %
Platelets: 166 10*3/uL (ref 150–400)
RBC: 4 MIL/uL (ref 3.87–5.11)
RDW: 13.9 % (ref 11.5–15.5)
WBC: 9 10*3/uL (ref 4.0–10.5)
nRBC: 0 % (ref 0.0–0.2)

## 2019-07-10 LAB — SARS CORONAVIRUS 2 BY RT PCR (HOSPITAL ORDER, PERFORMED IN ~~LOC~~ HOSPITAL LAB): SARS Coronavirus 2: NEGATIVE

## 2019-07-10 LAB — BASIC METABOLIC PANEL
Anion gap: 6 (ref 5–15)
BUN: 29 mg/dL — ABNORMAL HIGH (ref 8–23)
CO2: 30 mmol/L (ref 22–32)
Calcium: 8.7 mg/dL — ABNORMAL LOW (ref 8.9–10.3)
Chloride: 102 mmol/L (ref 98–111)
Creatinine, Ser: 1.5 mg/dL — ABNORMAL HIGH (ref 0.44–1.00)
GFR calc Af Amer: 35 mL/min — ABNORMAL LOW (ref >60)
GFR calc non Af Amer: 30 mL/min — ABNORMAL LOW (ref >60)
Glucose, Bld: 144 mg/dL — ABNORMAL HIGH (ref 70–99)
Potassium: 4.5 mmol/L (ref 3.5–5.1)
Sodium: 138 mmol/L (ref 135–145)

## 2019-07-10 LAB — PROTIME-INR
INR: 1 (ref 0.8–1.2)
Prothrombin Time: 13.5 seconds (ref 11.4–15.2)

## 2019-07-10 MED ORDER — MORPHINE SULFATE (PF) 4 MG/ML IV SOLN
4.0000 mg | INTRAVENOUS | Status: DC | PRN
  Administered 2019-07-11: 4 mg via INTRAVENOUS
  Filled 2019-07-10: qty 1

## 2019-07-10 MED ORDER — ONDANSETRON HCL 4 MG/2ML IJ SOLN
4.0000 mg | Freq: Four times a day (QID) | INTRAMUSCULAR | Status: DC | PRN
  Administered 2019-07-11: 4 mg via INTRAVENOUS
  Filled 2019-07-10: qty 2

## 2019-07-10 MED ORDER — MORPHINE SULFATE (PF) 4 MG/ML IV SOLN
4.0000 mg | Freq: Once | INTRAVENOUS | Status: AC
  Administered 2019-07-10: 21:00:00 4 mg via INTRAVENOUS
  Filled 2019-07-10: qty 1

## 2019-07-10 MED ORDER — ONDANSETRON HCL 4 MG PO TABS: 4.0000 mg | ORAL_TABLET | Freq: Four times a day (QID) | ORAL | Status: DC | PRN

## 2019-07-10 NOTE — ED Provider Notes (Signed)
[facility] EMERGENCY DEPARTMENT Provider Note   CSN: [id number] Arrival date & time: 07/10/19  2012     History   Chief Complaint Chief Complaint  Patient presents with   Fall    HPI Miranda Ford is a 85 y.o. female.     Patient is a 85 year old female with past medical history of congestive heart failure, paroxysmal A. fib, prior PE.  She presents today for evaluation of fall.  Patient was walking off of her porch to water some plants when she lost her balance and fell.  She landed on her left side and is complaining of pain in her left shoulder and left hip.  She was unable to stand and walk and was transported here by EMS.  Patient denies having struck her head, headache, or loss of consciousness.  Patient does take Eliquis.  The history is provided by the patient.  Fall This is a new problem. The current episode started less than 1 hour ago. The problem occurs constantly. The problem has not changed since onset.Pertinent negatives include no chest pain, no headaches and no shortness of breath. Nothing aggravates the symptoms. Nothing relieves the symptoms. She has tried nothing for the symptoms.    Past Medical History:  Diagnosis Date   Cataracts, bilateral    Chronic systolic CHF (congestive heart failure) ([place])    a. dx 06/2016 - conservative rx recommended. EF 20% with biventricular failure in setting of PE.   Demand ischemia ([place])    Epistaxis    Macular degeneration    Dry OD; wet OS   Osteoporosis    07/18/16 previously on Premarin; S/P > 10 years Alendronate   Paroxysmal atrial tachycardia ([place])    a. placed on amiodarone 06/2016.   Pulmonary emboli ([place]) 06/2016    Patient Active Problem List   Diagnosis Date Noted   Senile osteoporosis 07/18/2016   DNR (do not resuscitate) discussion    Palliative care encounter    Epistaxis 44/12/270   Acute systolic CHF (congestive heart failure) ([place]) 07/05/2016   Acute respiratory failure with  hypoxia ([place]) 07/05/2016   Secondary cardiomyopathy ([facility])    Biventricular failure ([facility])    Atrial tachycardia ([facility])    Paroxysmal atrial tachycardia ([place])    Demand ischemia ([facility])    Acute pulmonary embolism ([place]) 07/03/2016   COPD exacerbation ([place]) 07/03/2016   Cardiomegaly 07/03/2016   Coronary atherosclerosis of native coronary artery 07/03/2016   Elevated troponin     Past Surgical History:  Procedure Laterality Date   CATARACT EXTRACTION     bilaterally   CHOLECYSTECTOMY     TONSILLECTOMY       OB History    Gravida  0   Para      Term      Preterm      AB      Living        SAB      TAB      Ectopic      Multiple      Live Births               Home Medications    Prior to Admission medications   Medication Sig Start Date End Date Taking? Authorizing Provider  amiodarone (PACERONE) 200 MG tablet TAKE (1/2) TABLET BY MOUTH ONCE DAILY. 10/17/18   Satira Sark, MD  atorvastatin (LIPITOR) 40 MG tablet Take 1 tablet (40 mg total) by mouth at bedtime. 10/22/18   Satira Sark,  MD  calcium carbonate (OSCAL) 1500 (600 Ca) MG TABS tablet Take 1,500 mg by mouth 2 (two) times daily with a meal.    [provider]  carvedilol (COREG) 3.125 MG tablet TAKE 1 TABLET BY MOUTH TWICE DAILY WITH A MEAL. 04/25/18   Lendon Colonel, NP  ELIQUIS 2.5 MG TABS tablet TAKE 1 TABLET BY MOUTH AT 10AM AND 1 TABLET AT 9PM. 05/07/19   Satira Sark, MD  Multiple Vitamins-Minerals (ICAPS PO) Take 1 capsule by mouth 2 (two) times daily.    [provider]  spironolactone (ALDACTONE) 25 MG tablet Take 0.5 tablets (12.5 mg total) by mouth every other day. Patient taking differently: Take 12.5 mg by mouth daily.  03/27/17 12/02/18  Lendon Colonel, NP    Family History Family History  Problem Relation Age of Onset   Heart disease Mother    Stroke Mother    Diabetes Father    Cancer Sister        1 sister with metastatic  bone cancer; 1 with breast cancer    Social History Social History   Tobacco Use   Smoking status: Former Smoker    Types: Cigarettes    Quit date: 1975    Years since quitting: 45.5   Smokeless tobacco: Never Used  Substance Use Topics   Alcohol use: No   Drug use: No     Allergies   Codeine and Penicillins   Review of Systems Review of Systems  Respiratory: Negative for shortness of breath.   Cardiovascular: Negative for chest pain.  Neurological: Negative for headaches.  All other systems reviewed and are negative.    Physical Exam Updated Vital Signs BP (!) 182/72 (BP Location: Right Arm)    Pulse 63    Temp (!) 97.5 F (36.4 C) (Oral)    Resp 18    Ht 5\' 3"  (1.6 m)    Wt 56.7 kg    SpO2 95%    BMI 22.14 kg/m   Physical Exam Vitals signs and nursing note reviewed.  Constitutional:      General: She is not in acute distress.    Appearance: She is well-developed. She is not diaphoretic.  HENT:     Head: Normocephalic and atraumatic.  Neck:     Musculoskeletal: Normal range of motion and neck supple.  Cardiovascular:     Rate and Rhythm: Normal rate and regular rhythm.     Heart sounds: No murmur. No friction rub. No gallop.   Pulmonary:     Effort: Pulmonary effort is normal. No respiratory distress.     Breath sounds: Normal breath sounds. No wheezing.  Abdominal:     General: Bowel sounds are normal. There is no distension.     Palpations: Abdomen is soft.     Tenderness: There is no abdominal tenderness.  Musculoskeletal:     Comments: There is tenderness to palpation over the left lateral hip along with severe pain with any range of motion.  The leg is externally rotated and appears shortened.  DP pulses are equal and palpable bilaterally.  Motor and sensation are intact to both feet.  The left shoulder appears grossly normal.  There is tenderness to palpation over the lateral and posterior aspect.  Ulnar and radial pulses are easily palpable.   Patient is able to flex, extend, and oppose all fingers and sensation is intact throughout the entire hand.  Skin:    General: Skin is warm and dry.  Neurological:  Mental Status: She is alert and oriented to person, place, and time.      ED Treatments / Results  Labs (all labs ordered are listed, but only abnormal results are displayed) Labs Reviewed  BASIC METABOLIC PANEL  CBC WITH DIFFERENTIAL/PLATELET  PROTIME-INR    EKG None  Radiology No results found.  Procedures Procedures (including critical care time)  Medications Ordered in ED Medications  morphine 4 MG/ML injection 4 mg (has no administration in time range)     Initial Impression / Assessment and Plan / ED Course  I have reviewed the triage vital signs and the nursing notes.  Pertinent labs & imaging results that were available during my care of the patient were reviewed by me and considered in my medical decision making (see chart for details).  Patient with history of paroxysmal A. fib and prior PE presenting with complaints of fall.  She injured her left hip and left shoulder while going outside to water her plants.  She was brought here by EMS as she could not stand and walk.  Patient's x-rays show an intertrochanteric left hip fracture and proximal humerus fracture.  These findings were discussed with Dr. Aline Brochure from orthopedics.  He feels as though due to the patient's comorbidities she would be best served by transfer to [facility] for repair.  I spoke with Dr. Doreatha Martin from orthopedics who agrees to address the orthopedic injuries, but would like the patient admitted to the hospitalist service.  I spoke with Dr. Olevia Ford who agrees to admit.  Final Clinical Impressions(s) / ED Diagnoses   Final diagnoses:  None    ED Discharge Orders    None       Veryl Speak, MD 07/10/19 [id number]

## 2019-07-10 NOTE — ED Triage Notes (Signed)
Pt was walking outside of her home when she missed a step and fell on her left side. Pt denies becoming unconsciousness. Pt states she hit her head, left side of her hip and left shoulder. Pt states she is on eliquis.

## 2019-07-10 NOTE — ED Triage Notes (Signed)
Left leg rotated out and shorter than right on assessment

## 2019-07-10 NOTE — H&P (Signed)
History and Physical    Miranda Ford:[id number] DOB: 1924-06-25 DOA: 07/10/2019  PCP: Sharilyn Sites, MD   Patient coming from: Home.  I have personally briefly reviewed patient's old medical records in [place]  Chief Complaint: Fall.  HPI: Miranda Ford is a 85 y.o. female with medical history significant of bilateral cataracts, chronic systolic heart failure, history of epistaxis, macular degeneration, history of osteoporosis, history of paroxysmal atrial tachycardia on amiodarone, history of PE who is brought to the emergency department after having an accidental fall in her garden sustaining injuries to her left-sided upper extremities.  She denies dizziness, chest pain, dyspnea, palpitations, diaphoresis, nausea or emesis.  She denies PND, orthopnea or pitting edema of the lower extremities.  No fever, chills, sore throat, rhinorrhea, wheezing or hemoptysis.  Denies abdominal pain, diarrhea, melena or hematochezia.  Sometimes she gets constipated.  She denies dysuria, frequency or hematuria.  No polyuria, polydipsia, polyphagia or blurred vision.  ED Course: Initial vital signs temperature 97.5 F, pulse 59, respirations 17, blood pressure 182/72 mmHg and O2 sat 96% on room air.  The patient was given morphine in the emergency department.  Her CBC was normal.  PT was 13.5 and INR 1.0.  Glucose 144, BUN 29, creatinine 1.5 and calcium 8.7 mg/dL.  Her electrolytes otherwise were normal.  SARS coronavirus swab was negative. Imaging shows left humerus and left hip fracture.  Review of Systems: As per HPI otherwise 10 point review of systems negative.   Past Medical History:  Diagnosis Date   Cataracts, bilateral    Chronic systolic CHF (congestive heart failure) ([place])    a. dx 06/2016 - conservative rx recommended. EF 20% with biventricular failure in setting of PE.   Demand ischemia ([place])    Epistaxis    Macular degeneration    Dry OD; wet OS   Osteoporosis    07/18/16 previously on Premarin; S/P > 10 years Alendronate   Paroxysmal atrial tachycardia ([place])    a. placed on amiodarone 06/2016.   Pulmonary emboli ([place]) 06/2016    Past Surgical History:  Procedure Laterality Date   CATARACT EXTRACTION     bilaterally   CHOLECYSTECTOMY     TONSILLECTOMY       reports that she quit smoking about 45 years ago. Her smoking use included cigarettes. She has never used smokeless tobacco. She reports that she does not drink alcohol or use drugs.  Allergies  Allergen Reactions   Codeine Nausea Only   Penicillins Other (See Comments)    "Not allergic just a lot of my family members are allergic". Has patient had a PCN reaction causing immediate rash, facial/tongue/throat swelling, SOB or lightheadedness with hypotension: No Has patient had a PCN reaction causing severe rash involving mucus membranes or skin necrosis: No Has patient had a PCN reaction that required hospitalization No Has patient had a PCN reaction occurring within the last 10 years: No If all of the above answers are "NO", then may proceed with Cephalosporin use.     Family History  Problem Relation Age of Onset   Heart disease Mother    Stroke Mother    Diabetes Father    Cancer Sister        1 sister with metastatic bone cancer; 1 with breast cancer   Prior to Admission medications   Medication Sig Start Date End Date Taking? Authorizing Provider  amiodarone (PACERONE) 200 MG tablet TAKE (1/2) TABLET BY MOUTH ONCE DAILY. Patient taking differently:  Take 100 mg by mouth daily.  10/17/18  Yes Satira Sark, MD  atorvastatin (LIPITOR) 40 MG tablet Take 1 tablet (40 mg total) by mouth at bedtime. 10/22/18  Yes Satira Sark, MD  calcium carbonate (OSCAL) 1500 (600 Ca) MG TABS tablet Take 1,500 mg by mouth daily.    Yes [provider]  carvedilol (COREG) 3.125 MG tablet TAKE 1 TABLET BY MOUTH TWICE DAILY WITH A MEAL. Patient taking differently: Take  3.125 mg by mouth 2 (two) times daily with a meal.  04/25/18  Yes Lendon Colonel, NP  ELIQUIS 2.5 MG TABS tablet TAKE 1 TABLET BY MOUTH AT 10AM AND 1 TABLET AT 9PM. Patient taking differently: Take 2.5 mg by mouth 2 (two) times daily.  05/07/19  Yes Satira Sark, MD  Multiple Vitamins-Minerals (ICAPS PO) Take 1 capsule by mouth daily.    Yes [provider]  spironolactone (ALDACTONE) 25 MG tablet Take 0.5 tablets (12.5 mg total) by mouth every other day. 03/27/17 07/10/19 Yes Lendon Colonel, NP    Physical Exam: Vitals:   07/10/19 2023 07/10/19 2030 07/10/19 2100 07/10/19 2230  BP:  (!) 164/73 134/78 123/61  Pulse:  62 (!) 49 (!) 55  Resp:  14 13 14   Temp:      TempSrc:      SpO2:  95% (!) 89% 91%  Weight: 56.7 kg     Height: 5\' 3"  (1.6 m)       Constitutional: NAD, calm, comfortable Eyes: PERRL, lids and conjunctivae normal ENMT: Mucous membranes are moist. Posterior pharynx clear of any exudate or lesions. Neck: normal, supple, no masses, no thyromegaly Respiratory: clear to auscultation bilaterally, no wheezing, no crackles. Normal respiratory effort. No accessory muscle use.  Cardiovascular: Bradycardic in the high 40s and low 50s, 2/6 SEM, no rubs / gallops. No extremity edema. 2+ pedal pulses. No carotid bruits.  Abdomen: Soft, no tenderness, no masses palpated. No hepatosplenomegaly. Bowel sounds positive.  Musculoskeletal: no clubbing / cyanosis.  TTP on right shoulder area.  Shortened and laterally rotated LLE.  TTP on left hip area.  Good ROM, no contractures. Normal muscle tone.  Skin: Multiple areas of ecchymosis. Neurologic: CN 2-12 grossly intact. Sensation intact, DTR normal. Strength 5/5 in all 4.  Psychiatric: Normal judgment and insight. Alert and oriented x 3. Normal mood.   Labs on Admission: I have personally reviewed following labs and imaging studies  CBC: Recent Labs  Lab 07/10/19 2112  WBC 9.0  NEUTROABS 7.0  HGB 12.8  HCT 39.7    MCV 99.3  PLT 681   Basic Metabolic Panel: Recent Labs  Lab 07/10/19 2112  NA 138  K 4.5  CL 102  CO2 30  GLUCOSE 144*  BUN 29*  CREATININE 1.50*  CALCIUM 8.7*   GFR: Estimated Creatinine Clearance: 19.8 mL/min (A) (by C-G formula based on SCr of 1.5 mg/dL (H)). Liver Function Tests: No results for input(s): AST, ALT, ALKPHOS, BILITOT, PROT, ALBUMIN in the last 168 hours. No results for input(s): LIPASE, AMYLASE in the last 168 hours. No results for input(s): AMMONIA in the last 168 hours. Coagulation Profile: Recent Labs  Lab 07/10/19 2112  INR 1.0   Cardiac Enzymes: No results for input(s): CKTOTAL, CKMB, CKMBINDEX, TROPONINI in the last 168 hours. BNP (last 3 results) No results for input(s): PROBNP in the last 8760 hours. HbA1C: No results for input(s): HGBA1C in the last 72 hours. CBG: No results for input(s): GLUCAP in the  last 168 hours. Lipid Profile: No results for input(s): CHOL, HDL, LDLCALC, TRIG, CHOLHDL, LDLDIRECT in the last 72 hours. Thyroid Function Tests: No results for input(s): TSH, T4TOTAL, FREET4, T3FREE, THYROIDAB in the last 72 hours. Anemia Panel: No results for input(s): VITAMINB12, FOLATE, FERRITIN, TIBC, IRON, RETICCTPCT in the last 72 hours. Urine analysis:    Component Value Date/Time   COLORURINE YELLOW 07/03/2016 1557   APPEARANCEUR CLEAR 07/03/2016 1557   LABSPEC 1.010 07/03/2016 1557   PHURINE 5.5 07/03/2016 1557   GLUCOSEU NEGATIVE 07/03/2016 1557   HGBUR NEGATIVE 07/03/2016 1557   BILIRUBINUR NEGATIVE 07/03/2016 1557   KETONESUR NEGATIVE 07/03/2016 1557   PROTEINUR NEGATIVE 07/03/2016 1557   NITRITE NEGATIVE 07/03/2016 1557   LEUKOCYTESUR NEGATIVE 07/03/2016 1557    Radiological Exams on Admission: Dg Chest 1 View  Result Date: 07/10/2019 CLINICAL DATA:  Fall to left side.  Left shoulder and hip pain EXAM: CHEST  1 VIEW COMPARISON:  Chest radiograph 07/11/2016, chest CT 07/03/2016 FINDINGS: Redemonstration of the  fibrotic and emphysematous changes lungs seen on prior radiograph and cross-sectional imaging. No focal consolidative process. No pneumothorax or effusion. No visible displaced rib fracture. Cardiac size is at the upper limits of normal the possibly accentuated by supine imaging. The aorta is calcified and tortuous. Mild dextrocurvature of the lower thoracic spine with discogenic changes. IMPRESSION: No acute cardiopulmonary abnormality. Chronic coarse interstitial changes. Emphysema (ICD10-J43.9). Aortic Atherosclerosis (ICD10-I70.0). Electronically Signed   By: Lovena Le M.D.   On: 07/10/2019 22:13   Dg Shoulder 1v Left  Result Date: 07/10/2019 CLINICAL DATA:  Fall to left side, left shoulder and hip pain EXAM: LEFT SHOULDER - 1 VIEW COMPARISON:  None. FINDINGS: Unable to position patient for additional views due to pain. Single view demonstrates a left humeral fracture through the surgical neck with likely fracture line extension into the greater tuberosity. There is significant overlying soft tissue swelling. Glenohumeral acromioclavicular alignment is maintained. Included portion of the left chest wall is unremarkable. The lungs demonstrate coarse interstitial opacities with aortic atherosclerosis. Cardiac leads overlie the chest. IMPRESSION: Limited views due to patient pain. Fracture through the surgical neck of the left humerus, alignment and fracture extension difficult to assess on single view. Electronically Signed   By: Lovena Le M.D.   On: 07/10/2019 22:10   Dg Hip Unilat W Or Wo Pelvis 2-3 Views Left  Result Date: 07/10/2019 CLINICAL DATA:  Fall to left side. Left shoulder and left hip pain. Osteoporosis. EXAM: DG HIP (WITH OR WITHOUT PELVIS) 2-3V LEFT COMPARISON:  None. FINDINGS: Intertrochanteric left femur fracture with mild varus angulation and foreshortening across the fracture line. Femoral heads remain normally located. Remaining bones of the pelvis are congruent. Evaluation the  sacrum is limited by overlying bowel gas of the visualized sacral are not are maintained. Discogenic and facet degenerative changes are present in the included lumbar spine. Bowel gas pattern is nonobstructive. Vascular calcified fully bolus are noted in the pelvis. Corticated mineralization adjacent to the anterior superior iliac spine may reflect injection granuloma. IMPRESSION: Mildly foreshortened and varus angulated intertrochanteric left femur fracture. Electronically Signed   By: Lovena Le M.D.   On: 07/10/2019 22:07    EKG: Independently reviewed.    Assessment/Plan Principal Problem:   Closed left hip fracture, initial encounter ([place])   Left supracondylar humerus fracture, closed, initial encounter Admit to [facility]. Analgesics as needed. Hip fracture protocol orders. Orthopedic surgery to evaluate.  Active Problems:   Bradycardia Hold carvedilol and  amiodarone for now.    Coronary atherosclerosis of native coronary artery Carvedilol and Eliquis being held. Continue atorvastatin.    Paroxysmal atrial tachycardia (HCC) Hold amiodarone and carvedilol due to bradycardia.    Chronic systolic CHF (congestive heart failure) (HCC) No signs of decompensation. Beta-blocker on hold.    History of pulmonary embolism On Eliquis.    DVT prophylaxis: On Eliquis. Code Status: Full code. Family Communication: Disposition Plan: Admit for orthopedic surgery evaluation and possible surgical intervention. Consults called: Orthopedic surgery will evaluate in the morning per EDP. Admission status: Inpatient/telemetry.   Reubin Milan MD [facility]  If 7PM-7AM, please contact night-coverage [email or web address]  07/10/2019, 11:42 PM   This document was prepared using Dragon voice recognition software and may contain some unintended transcription errors.

## 2019-07-10 NOTE — ED Notes (Signed)
Pt family notified of transfer and surgery

## 2019-07-10 NOTE — Progress Notes (Signed)
Ortho Trauma Note  Reviewed case with Dr. Stark Jock. 85 yo w/ ground level fall on Eliquis. Left intertrochanteric femur fracture and left proximal humerus fracture. Recommend cephalomedullary nailing tomorrow for left hip and nonoperative treatment of left proximal humerus. Admission to hospitalist at [facility]. Formal consult to follow tomorrow AM.  Shona Needles, MD [facility] [phone] (phone) [phone] (office) [email or web address]

## 2019-07-11 ENCOUNTER — Encounter (HOSPITAL_COMMUNITY)
Admission: EM | Disposition: A | Discharge status: Skilled Nursing Facility | Payer: Self-pay | Source: Home / Self Care | Attending: Internal Medicine

## 2019-07-11 ENCOUNTER — Inpatient Hospital Stay (HOSPITAL_COMMUNITY): Payer: Medicare Other

## 2019-07-11 ENCOUNTER — Encounter (HOSPITAL_COMMUNITY): Payer: Self-pay | Admitting: Critical Care Medicine

## 2019-07-11 ENCOUNTER — Inpatient Hospital Stay (HOSPITAL_COMMUNITY): Payer: Medicare Other | Admitting: Anesthesiology

## 2019-07-11 DIAGNOSIS — S42202A Unspecified fracture of upper end of left humerus, initial encounter for closed fracture: Secondary | ICD-10-CM | POA: Diagnosis present

## 2019-07-11 HISTORY — DX: Unspecified fracture of upper end of left humerus, initial encounter for closed fracture: S42.202A

## 2019-07-11 HISTORY — PX: INTRAMEDULLARY (IM) NAIL INTERTROCHANTERIC: SHX5875

## 2019-07-11 LAB — SURGICAL PCR SCREEN
MRSA, PCR: NEGATIVE
Staphylococcus aureus: NEGATIVE

## 2019-07-11 LAB — ABO/RH: ABO/RH(D): O POS

## 2019-07-11 LAB — TYPE AND SCREEN
ABO/RH(D): O POS
Antibody Screen: NEGATIVE

## 2019-07-11 SURGERY — FIXATION, FRACTURE, INTERTROCHANTERIC, WITH INTRAMEDULLARY ROD
Anesthesia: General | Laterality: Left

## 2019-07-11 MED ORDER — VANCOMYCIN HCL IN DEXTROSE 1-5 GM/200ML-% IV SOLN
1000.0000 mg | Freq: Once | INTRAVENOUS | Status: AC
  Administered 2019-07-11: 1000 mg via INTRAVENOUS
  Filled 2019-07-11: qty 200

## 2019-07-11 MED ORDER — SENNOSIDES-DOCUSATE SODIUM 8.6-50 MG PO TABS: 2.0000 | ORAL_TABLET | Freq: Every evening | ORAL | Status: DC | PRN

## 2019-07-11 MED ORDER — MORPHINE SULFATE (PF) 2 MG/ML IV SOLN
1.0000 mg | INTRAVENOUS | Status: DC | PRN
  Administered 2019-07-11: 2 mg via INTRAVENOUS

## 2019-07-11 MED ORDER — DEXAMETHASONE SODIUM PHOSPHATE 10 MG/ML IJ SOLN
INTRAMUSCULAR | Status: DC | PRN
  Administered 2019-07-11: 4 mg via INTRAVENOUS

## 2019-07-11 MED ORDER — SUCCINYLCHOLINE CHLORIDE 200 MG/10ML IV SOSY
PREFILLED_SYRINGE | INTRAVENOUS | Status: DC | PRN
  Administered 2019-07-11: 80 mg via INTRAVENOUS

## 2019-07-11 MED ORDER — HYDROCODONE-ACETAMINOPHEN 5-325 MG PO TABS
1.0000 | ORAL_TABLET | ORAL | Status: DC | PRN
  Administered 2019-07-11: 2 via ORAL
  Filled 2019-07-11: qty 2

## 2019-07-11 MED ORDER — VITAMIN C 500 MG PO TABS
500.0000 mg | ORAL_TABLET | Freq: Every day | ORAL | Status: DC
  Administered 2019-07-11 – 2019-07-14 (×4): 500 mg via ORAL
  Filled 2019-07-11 (×4): qty 1

## 2019-07-11 MED ORDER — ONDANSETRON HCL 4 MG/2ML IJ SOLN: 4.0000 mg | Freq: Four times a day (QID) | INTRAMUSCULAR | Status: DC | PRN

## 2019-07-11 MED ORDER — METOCLOPRAMIDE HCL 5 MG PO TABS: 5.0000 mg | ORAL_TABLET | Freq: Three times a day (TID) | ORAL | Status: DC | PRN

## 2019-07-11 MED ORDER — ENSURE ENLIVE PO LIQD
237.0000 mL | Freq: Two times a day (BID) | ORAL | Status: DC
  Administered 2019-07-12 – 2019-07-14 (×6): 237 mL via ORAL

## 2019-07-11 MED ORDER — FENTANYL CITRATE (PF) 250 MCG/5ML IJ SOLN
INTRAMUSCULAR | Status: DC | PRN
  Administered 2019-07-11 (×2): 50 ug via INTRAVENOUS

## 2019-07-11 MED ORDER — VITAMIN D 25 MCG (1000 UNIT) PO TABS
2000.0000 [IU] | ORAL_TABLET | Freq: Two times a day (BID) | ORAL | Status: DC
  Administered 2019-07-11 – 2019-07-14 (×7): 2000 [IU] via ORAL
  Filled 2019-07-11 (×7): qty 2

## 2019-07-11 MED ORDER — ONDANSETRON HCL 4 MG PO TABS: 4.0000 mg | ORAL_TABLET | Freq: Four times a day (QID) | ORAL | Status: DC | PRN

## 2019-07-11 MED ORDER — APIXABAN 2.5 MG PO TABS
2.5000 mg | ORAL_TABLET | Freq: Two times a day (BID) | ORAL | Status: DC
  Administered 2019-07-12 – 2019-07-14 (×5): 2.5 mg via ORAL
  Filled 2019-07-11 (×5): qty 1

## 2019-07-11 MED ORDER — SUGAMMADEX SODIUM 200 MG/2ML IV SOLN
INTRAVENOUS | Status: DC | PRN
  Administered 2019-07-11: 120 mg via INTRAVENOUS

## 2019-07-11 MED ORDER — ETOMIDATE 2 MG/ML IV SOLN
INTRAVENOUS | Status: DC | PRN
  Administered 2019-07-11: 8 mg via INTRAVENOUS

## 2019-07-11 MED ORDER — LACTATED RINGERS IV SOLN
INTRAVENOUS | Status: DC
  Administered 2019-07-11 – 2019-07-12 (×3): via INTRAVENOUS

## 2019-07-11 MED ORDER — CALCIUM CARBONATE 1250 (500 CA) MG PO TABS
1250.0000 mg | ORAL_TABLET | Freq: Every day | ORAL | Status: DC
  Administered 2019-07-12 – 2019-07-14 (×3): 1250 mg via ORAL
  Filled 2019-07-11 (×3): qty 1

## 2019-07-11 MED ORDER — ROCURONIUM BROMIDE 10 MG/ML (PF) SYRINGE
PREFILLED_SYRINGE | INTRAVENOUS | Status: DC | PRN
  Administered 2019-07-11: 40 mg via INTRAVENOUS
  Administered 2019-07-11: 10 mg via INTRAVENOUS

## 2019-07-11 MED ORDER — MUPIROCIN 2 % EX OINT
1.0000 "application " | TOPICAL_OINTMENT | Freq: Two times a day (BID) | CUTANEOUS | Status: DC
  Administered 2019-07-11 – 2019-07-14 (×6): 1 via NASAL
  Filled 2019-07-11 (×3): qty 22

## 2019-07-11 MED ORDER — PHENOL 1.4 % MT LIQD: 1.0000 | OROMUCOSAL | Status: DC | PRN

## 2019-07-11 MED ORDER — POLYETHYLENE GLYCOL 3350 17 G PO PACK
17.0000 g | PACK | Freq: Every day | ORAL | Status: DC | PRN
  Administered 2019-07-12: 17 g via ORAL
  Filled 2019-07-11: qty 1

## 2019-07-11 MED ORDER — FENTANYL CITRATE (PF) 250 MCG/5ML IJ SOLN
INTRAMUSCULAR | Status: AC
  Filled 2019-07-11: qty 5

## 2019-07-11 MED ORDER — SODIUM CHLORIDE 0.9 % IV SOLN
INTRAVENOUS | Status: DC | PRN
  Administered 2019-07-11: 11:00:00 10 ug/min via INTRAVENOUS

## 2019-07-11 MED ORDER — METOCLOPRAMIDE HCL 5 MG/ML IJ SOLN: 5.0000 mg | Freq: Three times a day (TID) | INTRAMUSCULAR | Status: DC | PRN

## 2019-07-11 MED ORDER — MENTHOL 3 MG MT LOZG: 1.0000 | LOZENGE | OROMUCOSAL | Status: DC | PRN

## 2019-07-11 MED ORDER — ACETAMINOPHEN 325 MG PO TABS
650.0000 mg | ORAL_TABLET | Freq: Three times a day (TID) | ORAL | Status: AC
  Administered 2019-07-11 – 2019-07-12 (×3): 650 mg via ORAL
  Filled 2019-07-11 (×4): qty 2

## 2019-07-11 MED ORDER — PROMETHAZINE HCL 25 MG/ML IJ SOLN: 6.2500 mg | INTRAMUSCULAR | Status: DC | PRN

## 2019-07-11 MED ORDER — ONDANSETRON HCL 4 MG/2ML IJ SOLN
INTRAMUSCULAR | Status: DC | PRN
  Administered 2019-07-11: 4 mg via INTRAVENOUS

## 2019-07-11 MED ORDER — CLINDAMYCIN PHOSPHATE 600 MG/50ML IV SOLN
600.0000 mg | Freq: Four times a day (QID) | INTRAVENOUS | Status: AC
  Administered 2019-07-11 (×2): 600 mg via INTRAVENOUS
  Filled 2019-07-11 (×2): qty 50

## 2019-07-11 MED ORDER — MORPHINE SULFATE (PF) 2 MG/ML IV SOLN
INTRAVENOUS | Status: AC
  Filled 2019-07-11: qty 1

## 2019-07-11 MED ORDER — ACETAMINOPHEN 325 MG PO TABS
325.0000 mg | ORAL_TABLET | Freq: Four times a day (QID) | ORAL | Status: DC | PRN
  Administered 2019-07-12 – 2019-07-14 (×5): 650 mg via ORAL
  Filled 2019-07-11 (×5): qty 2

## 2019-07-11 MED ORDER — AMIODARONE HCL 200 MG PO TABS
100.0000 mg | ORAL_TABLET | Freq: Every day | ORAL | Status: DC
  Administered 2019-07-12 – 2019-07-14 (×3): 100 mg via ORAL
  Filled 2019-07-11 (×3): qty 1

## 2019-07-11 MED ORDER — DOCUSATE SODIUM 100 MG PO CAPS
100.0000 mg | ORAL_CAPSULE | Freq: Two times a day (BID) | ORAL | Status: DC
  Administered 2019-07-11 – 2019-07-13 (×6): 100 mg via ORAL
  Filled 2019-07-11 (×7): qty 1

## 2019-07-11 MED ORDER — SPIRONOLACTONE 12.5 MG HALF TABLET
12.5000 mg | ORAL_TABLET | ORAL | Status: DC
  Administered 2019-07-13: 12.5 mg via ORAL
  Filled 2019-07-11 (×2): qty 1

## 2019-07-11 MED ORDER — MORPHINE SULFATE (PF) 2 MG/ML IV SOLN: 0.5000 mg | INTRAVENOUS | Status: DC | PRN

## 2019-07-11 MED ORDER — 0.9 % SODIUM CHLORIDE (POUR BTL) OPTIME
TOPICAL | Status: DC | PRN
  Administered 2019-07-11: 1000 mL

## 2019-07-11 SURGICAL SUPPLY — 53 items
BIT DRILL 4.3MMS DISTAL GRDTED (BIT) IMPLANT
BNDG COHESIVE 6X5 TAN STRL LF (GAUZE/BANDAGES/DRESSINGS) IMPLANT
BRUSH SCRUB EZ PLAIN DRY (MISCELLANEOUS) ×6 IMPLANT
COVER PERINEAL POST (MISCELLANEOUS) ×3 IMPLANT
COVER SURGICAL LIGHT HANDLE (MISCELLANEOUS) ×6 IMPLANT
COVER WAND RF STERILE (DRAPES) ×3 IMPLANT
DRAPE C-ARMOR (DRAPES) ×3 IMPLANT
DRAPE HALF SHEET 40X57 (DRAPES) IMPLANT
DRAPE ORTHO SPLIT 77X108 STRL (DRAPES)
DRAPE STERI IOBAN 125X83 (DRAPES) ×3 IMPLANT
DRAPE SURG ORHT 6 SPLT 77X108 (DRAPES) IMPLANT
DRAPE U-SHAPE 47X51 STRL (DRAPES) ×3 IMPLANT
DRILL 4.3MMS DISTAL GRADUATED (BIT) ×3
DRSG EMULSION OIL 3X3 NADH (GAUZE/BANDAGES/DRESSINGS) ×3 IMPLANT
DRSG MEPILEX BORDER 4X4 (GAUZE/BANDAGES/DRESSINGS) ×3 IMPLANT
DRSG MEPILEX BORDER 4X8 (GAUZE/BANDAGES/DRESSINGS) ×3 IMPLANT
ELECT REM PT RETURN 9FT ADLT (ELECTROSURGICAL) ×3
ELECTRODE REM PT RTRN 9FT ADLT (ELECTROSURGICAL) ×1 IMPLANT
GLOVE BIO SURGEON STRL SZ7.5 (GLOVE) ×3 IMPLANT
GLOVE BIO SURGEON STRL SZ8 (GLOVE) ×3 IMPLANT
GLOVE BIOGEL PI IND STRL 7.5 (GLOVE) ×1 IMPLANT
GLOVE BIOGEL PI IND STRL 8 (GLOVE) ×1 IMPLANT
GLOVE BIOGEL PI INDICATOR 7.5 (GLOVE) ×2
GLOVE BIOGEL PI INDICATOR 8 (GLOVE) ×2
GOWN STRL REUS W/ TWL LRG LVL3 (GOWN DISPOSABLE) ×2 IMPLANT
GOWN STRL REUS W/ TWL XL LVL3 (GOWN DISPOSABLE) ×1 IMPLANT
GOWN STRL REUS W/TWL LRG LVL3 (GOWN DISPOSABLE) ×6
GOWN STRL REUS W/TWL XL LVL3 (GOWN DISPOSABLE) ×3
GUIDEPIN 3.2X17.5 THRD DISP (PIN) ×6 IMPLANT
GUIDEWIRE BALL NOSE 80CM (WIRE) ×2 IMPLANT
HIP FRAC NAIL LAG SCR 10.5X100 (Orthopedic Implant) ×2 IMPLANT
HIP FRAC NAIL LEFT 11X360MM (Orthopedic Implant) ×3 IMPLANT
KIT BASIN OR (CUSTOM PROCEDURE TRAY) ×3 IMPLANT
KIT TURNOVER KIT B (KITS) ×3 IMPLANT
MANIFOLD NEPTUNE II (INSTRUMENTS) ×3 IMPLANT
NAIL HIP FRAC LEFT 11X360MM (Orthopedic Implant) IMPLANT
NS IRRIG 1000ML POUR BTL (IV SOLUTION) ×3 IMPLANT
PACK GENERAL/GYN (CUSTOM PROCEDURE TRAY) ×3 IMPLANT
PAD ARMBOARD 7.5X6 YLW CONV (MISCELLANEOUS) ×6 IMPLANT
SCREW BONE CORTICAL 5.0X44 (Screw) ×2 IMPLANT
SCREW CANN THRD AFF 10.5X100 (Orthopedic Implant) IMPLANT
STAPLER VISISTAT 35W (STAPLE) ×3 IMPLANT
STOCKINETTE IMPERVIOUS LG (DRAPES) IMPLANT
SUT ETHILON 3 0 PS 1 (SUTURE) ×3 IMPLANT
SUT VIC AB 0 CT1 27 (SUTURE) ×3
SUT VIC AB 0 CT1 27XBRD ANBCTR (SUTURE) ×1 IMPLANT
SUT VIC AB 1 CT1 27 (SUTURE) ×3
SUT VIC AB 1 CT1 27XBRD ANBCTR (SUTURE) ×1 IMPLANT
SUT VIC AB 2-0 CT1 27 (SUTURE) ×3
SUT VIC AB 2-0 CT1 TAPERPNT 27 (SUTURE) ×1 IMPLANT
TOWEL GREEN STERILE (TOWEL DISPOSABLE) ×6 IMPLANT
TOWEL GREEN STERILE FF (TOWEL DISPOSABLE) ×3 IMPLANT
WATER STERILE IRR 1000ML POUR (IV SOLUTION) ×3 IMPLANT

## 2019-07-11 NOTE — Anesthesia Postprocedure Evaluation (Signed)
Anesthesia Post Note  Patient: Teryn O Mcafee  Procedure(s) Performed: INTRAMEDULLARY (IM) NAIL INTERTROCHANTRIC (Left )     Patient location during evaluation: PACU Anesthesia Type: General Level of consciousness: awake and alert Pain management: pain level controlled Vital Signs Assessment: post-procedure vital signs reviewed and stable Respiratory status: spontaneous breathing, nonlabored ventilation and respiratory function stable Cardiovascular status: blood pressure returned to baseline and stable Postop Assessment: no apparent nausea or vomiting Anesthetic complications: no    Last Vitals:  Vitals:   07/11/19 1316 07/11/19 1332  BP: 137/63 (!) 127/94  Pulse: 63 63  Resp: 13 14  Temp:    SpO2: 100% 100%    Last Pain:  Vitals:   07/11/19 1332  TempSrc:   PainSc: 2                  Kyngston Pickelsimer,W. EDMOND

## 2019-07-11 NOTE — Progress Notes (Signed)
Initial Nutrition Assessment  DOCUMENTATION CODES:   Not applicable  INTERVENTION:  Once diet advances, provide Ensure Enlive po BID, each supplement provides 350 kcal and 20 grams of protein  NUTRITION DIAGNOSIS:   Increased nutrient needs related to chronic illness(CHF) as evidenced by estimated needs.  GOAL:   Patient will meet greater than or equal to 90% of their needs  MONITOR:   Diet advancement, Supplement acceptance, Labs, Weight trends, Skin, I & O's  REASON FOR ASSESSMENT:   Consult Assessment of nutrition requirement/status  ASSESSMENT:   85 y.o. female with medical history significant of bilateral cataracts, chronic systolic heart failure, history of epistaxis, macular degeneration, history of osteoporosis, history of paroxysmal atrial tachycardia on amiodarone, history of PE presents after fall at home. Imaging shows left humerus and left hip fracture.  Pt currently in OR for IM nail of left hip fracture. Per MD, non-operative treatment of left proximal humerus. RD unable to obtain pt nutrition history at this time. RD to order nutritional supplements to aid in caloric and protein needs as well as in post op healing. RN to provide supplement once diet advances as appropriate.   Unable to complete Nutrition-Focused physical exam at this time.   Labs and medications reviewed.   Diet Order:   Diet Order            Diet regular Room service appropriate? Yes; Fluid consistency: Thin  Diet effective 1000              EDUCATION NEEDS:   Not appropriate for education at this time  Skin:  Skin Assessment: Reviewed RN Assessment  Last BM:  7/17  Height:   Ht Readings from Last 1 Encounters:  07/10/19 5\' 3"  (1.6 m)    Weight:   Wt Readings from Last 1 Encounters:  07/10/19 56.7 kg    Ideal Body Weight:  52.27 kg  BMI:  Body mass index is 22.14 kg/m.  Estimated Nutritional Needs:   Kcal:  1500-1650  Protein:  65-75 grams  Fluid:  >/= 1.5  L/day    Corrin Parker, MS, RD, LDN Pager # [phone] After hours/ weekend pager # [phone]

## 2019-07-11 NOTE — Brief Op Note (Signed)
07/11/2019  1:50 PM  See full op note.

## 2019-07-11 NOTE — Consult Note (Signed)
Orthopaedic Trauma Service (OTS) Consult   Patient ID: Miranda Ford MRN: [id number] DOB/AGE: [birth year] 85 y.o.   Reason for Consult: Left hip fracture and left proximal humerus fracture Referring Physician:  Veryl Speak, MD (EDP)   HPI: Miranda Ford is an 85 y.o. right-hand-dominant white female who is walker dependent and lives by her self with history notable for CHF, osteoporosis, paroxysmal Atrial tachycardia and history of PE (on chronic anticoagulation with Eliquis greater than 3 years) who sustained a ground-level fall while watering her flowers yesterday.  Patient tripped over something and fell directly on her left side.  Patient was initially brought to [facility] where she was found to have multiple fractures.  The ED physician at any [facility] contacted on-call physician at [facility] who recommended transfer to be treated at [facility].  On-call physician yesterday was Dr. Doreatha Martin who is part of the orthopedic trauma service.  In order to expedite patient's care Dr. Marcelino Scot has assumed management the patient's treatment still remains under the orthopedic trauma service umbrella.  Patient seen and evaluated [address].  She is doing well but does complain of left shoulder pain as well as left hip pain.  She denies any numbness or tingling in extremities.  Denies any injuries to her right upper or right lower extremity.  She did not hit her head or lose consciousness by her report.  No chest pain or shortness of breath no abdominal pain.  Again she does live by herself.  She is able to ambulate short distances without her walker around her house but does use a walker when she goes out.  She states that her family lives about 2 to 3 miles away.  Historical information does report that she was on Fosamax for more than 10 years but this is been stopped.  She is also been on Premarin (estrogenic agents) for osteoporosis in the past as well.  Does not appear that she is  on any pharmacologic agent for her osteoporosis.  She is on calcium.  Last bone density scan that I see is from 2013 which did show osteoporosis in both her lumbar spine and right proximal femur.  Lumbar spine with a T score of -4.0 and T score for the right femur -2.6  Last dose of Eliquis was yesterday morning  COVID-19 testing is negative   Past Medical History:  Diagnosis Date  . Cataracts, bilateral   . Chronic systolic CHF (congestive heart failure) ([place])    a. dx 06/2016 - conservative rx recommended. EF 20% with biventricular failure in setting of PE.  . Demand ischemia ([place])   . Epistaxis   . Macular degeneration    Dry OD; wet OS  . Osteoporosis    07/18/16 previously on Premarin; S/P > 10 years Alendronate  . Paroxysmal atrial tachycardia ([place])    a. placed on amiodarone 06/2016.  . Pulmonary emboli ([place]) 06/2016    Past Surgical History:  Procedure Laterality Date  . CATARACT EXTRACTION     bilaterally  . CHOLECYSTECTOMY    . TONSILLECTOMY      Family History  Problem Relation Age of Onset  . Heart disease Mother   . Stroke Mother   . Diabetes Father   . Cancer Sister        1 sister with metastatic bone cancer; 1 with breast cancer    Social History:  reports that she quit smoking about 45 years ago.  Her smoking use included cigarettes. She has never used smokeless tobacco. She reports that she does not drink alcohol or use drugs.  Allergies:  Allergies  Allergen Reactions  . Codeine Nausea Only  . Penicillins Other (See Comments)    "Not allergic just a lot of my family members are allergic". Has patient had a PCN reaction causing immediate rash, facial/tongue/throat swelling, SOB or lightheadedness with hypotension: No Has patient had a PCN reaction causing severe rash involving mucus membranes or skin necrosis: No Has patient had a PCN reaction that required hospitalization No Has patient had a PCN reaction occurring within the last 10 years: No If all  of the above answers are "NO", then may proceed with Cephalosporin use.     Medications:  I have reviewed the patient's current medications. Prior to Admission:  Medications Prior to Admission  Medication Sig Dispense Refill Last Dose  . amiodarone (PACERONE) 200 MG tablet TAKE (1/2) TABLET BY MOUTH ONCE DAILY. (Patient taking differently: Take 100 mg by mouth daily. ) 45 tablet 3 07/10/2019 at Unknown time  . atorvastatin (LIPITOR) 40 MG tablet Take 1 tablet (40 mg total) by mouth at bedtime. 90 tablet 3 07/09/2019 at Unknown time  . calcium carbonate (OSCAL) 1500 (600 Ca) MG TABS tablet Take 1,500 mg by mouth daily.    07/10/2019 at Unknown time  . carvedilol (COREG) 3.125 MG tablet TAKE 1 TABLET BY MOUTH TWICE DAILY WITH A MEAL. (Patient taking differently: Take 3.125 mg by mouth 2 (two) times daily with a meal. ) 60 tablet 5 07/10/2019 at 1800  . ELIQUIS 2.5 MG TABS tablet TAKE 1 TABLET BY MOUTH AT 10AM AND 1 TABLET AT 9PM. (Patient taking differently: Take 2.5 mg by mouth 2 (two) times daily. ) 60 tablet 6 07/10/2019 at 1000  . Multiple Vitamins-Minerals (ICAPS PO) Take 1 capsule by mouth daily.    07/10/2019 at Unknown time  . spironolactone (ALDACTONE) 25 MG tablet Take 0.5 tablets (12.5 mg total) by mouth every other day. 45 tablet 3 07/10/2019 at Unknown time    Results for orders placed or performed during the hospital encounter of 07/10/19 (from the past 48 hour(s))  Basic metabolic panel     Status: Abnormal   Collection Time: 07/10/19  9:12 PM  Result Value Ref Range   Sodium 138 135 - 145 mmol/L   Potassium 4.5 3.5 - 5.1 mmol/L   Chloride 102 98 - 111 mmol/L   CO2 30 22 - 32 mmol/L   Glucose, Bld 144 (H) 70 - 99 mg/dL   BUN 29 (H) 8 - 23 mg/dL   Creatinine, Ser 1.50 (H) 0.44 - 1.00 mg/dL   Calcium 8.7 (L) 8.9 - 10.3 mg/dL   GFR calc non Af Amer 30 (L) >60 mL/min   GFR calc Af Amer 35 (L) >60 mL/min   Anion gap 6 5 - 15    Comment: Performed at [facility], [address] [place], [place] [address]  CBC with Differential     Status: None   Collection Time: 07/10/19  9:12 PM  Result Value Ref Range   WBC 9.0 4.0 - 10.5 K/uL   RBC 4.00 3.87 - 5.11 MIL/uL   Hemoglobin 12.8 12.0 - 15.0 g/dL   HCT 39.7 36.0 - 46.0 %   MCV 99.3 80.0 - 100.0 fL   MCH 32.0 26.0 - 34.0 pg   MCHC 32.2 30.0 - 36.0 g/dL   RDW 13.9 11.5 - 15.5 %  Platelets 166 150 - 400 K/uL   nRBC 0.0 0.0 - 0.2 %   Neutrophils Relative % 78 %   Neutro Abs 7.0 1.7 - 7.7 K/uL   Lymphocytes Relative 14 %   Lymphs Abs 1.2 0.7 - 4.0 K/uL   Monocytes Relative 7 %   Monocytes Absolute 0.6 0.1 - 1.0 K/uL   Eosinophils Relative 1 %   Eosinophils Absolute 0.1 0.0 - 0.5 K/uL   Basophils Relative 0 %   Basophils Absolute 0.0 0.0 - 0.1 K/uL   Immature Granulocytes 0 %   Abs Immature Granulocytes 0.03 0.00 - 0.07 K/uL    Comment: Performed at [facility], [address], [place], [place] [address]  Protime-INR     Status: None   Collection Time: 07/10/19  9:12 PM  Result Value Ref Range   Prothrombin Time 13.5 11.4 - 15.2 seconds   INR 1.0 0.8 - 1.2    Comment: (NOTE) INR goal varies based on device and disease states. Performed at [facility], [address], [place], [place] [address]   SARS Coronavirus 2 ([place] - Performed in [facility] hospital lab), Hosp Order     Status: None   Collection Time: 07/10/19 10:11 PM   Specimen: Nasopharyngeal Swab  Result Value Ref Range   SARS Coronavirus 2 NEGATIVE NEGATIVE    Comment: (NOTE) If result is NEGATIVE SARS-CoV-2 target nucleic acids are NOT DETECTED. The SARS-CoV-2 RNA is generally detectable in upper and lower  respiratory specimens during the acute phase of infection. The lowest  concentration of SARS-CoV-2 viral copies this assay can detect is 250  copies / mL. A negative result does not preclude SARS-CoV-2 infection  and should not be used as the sole basis for treatment or other  patient management decisions.  A negative result may  occur with  improper specimen collection / handling, submission of specimen other  than nasopharyngeal swab, presence of viral mutation(s) within the  areas targeted by this assay, and inadequate number of viral copies  (<250 copies / mL). A negative result must be combined with clinical  observations, patient history, and epidemiological information. If result is POSITIVE SARS-CoV-2 target nucleic acids are DETECTED. The SARS-CoV-2 RNA is generally detectable in upper and lower  respiratory specimens dur ing the acute phase of infection.  Positive  results are indicative of active infection with SARS-CoV-2.  Clinical  correlation with patient history and other diagnostic information is  necessary to determine patient infection status.  Positive results do  not rule out bacterial infection or co-infection with other viruses. If result is PRESUMPTIVE POSTIVE SARS-CoV-2 nucleic acids MAY BE PRESENT.   A presumptive positive result was obtained on the submitted specimen  and confirmed on repeat testing.  While 2019 novel coronavirus  (SARS-CoV-2) nucleic acids may be present in the submitted sample  additional confirmatory testing may be necessary for epidemiological  and / or clinical management purposes  to differentiate between  SARS-CoV-2 and other Sarbecovirus currently known to infect humans.  If clinically indicated additional testing with an alternate test  methodology [phone]) is advised. The SARS-CoV-2 RNA is generally  detectable in upper and lower respiratory sp ecimens during the acute  phase of infection. The expected result is Negative. Fact Sheet for Patients:  StrictlyIdeas.no Fact Sheet for Healthcare Providers: BankingDealers.co.za This test is not yet approved or cleared by the [place] FDA and has been authorized for detection and/or diagnosis of SARS-CoV-2 by FDA under an Emergency Use Authorization (  EUA).  This  EUA will remain in effect (meaning this test can be used) for the duration of the COVID-19 declaration under Section 564(b)(1) of the Act, 21 U.S.C. section 360bbb-3(b)(1), unless the authorization is terminated or revoked sooner. Performed at [facility], [address], [place], [place] [address]   Surgical PCR screen     Status: None   Collection Time: 07/11/19  5:45 AM   Specimen: Nasal Mucosa; Nasal Swab  Result Value Ref Range   MRSA, PCR NEGATIVE NEGATIVE   Staphylococcus aureus NEGATIVE NEGATIVE    Comment: (NOTE) The Xpert SA Assay (FDA approved for NASAL specimens in patients 37 years of age and older), is one component of a comprehensive surveillance program. It is not intended to diagnose infection nor to guide or monitor treatment. Performed at [facility], [place] [address], [place], [place] [address]     Dg Chest 1 View  Result Date: 07/10/2019 CLINICAL DATA:  Fall to left side.  Left shoulder and hip pain EXAM: CHEST  1 VIEW COMPARISON:  Chest radiograph 07/11/2016, chest CT 07/03/2016 FINDINGS: Redemonstration of the fibrotic and emphysematous changes lungs seen on prior radiograph and cross-sectional imaging. No focal consolidative process. No pneumothorax or effusion. No visible displaced rib fracture. Cardiac size is at the upper limits of normal the possibly accentuated by supine imaging. The aorta is calcified and tortuous. Mild dextrocurvature of the lower thoracic spine with discogenic changes. IMPRESSION: No acute cardiopulmonary abnormality. Chronic coarse interstitial changes. Emphysema (ICD10-J43.9). Aortic Atherosclerosis (ICD10-I70.0). Electronically Signed   By: Lovena Le M.D.   On: 07/10/2019 22:13   Dg Shoulder 1v Left  Result Date: 07/10/2019 CLINICAL DATA:  Fall to left side, left shoulder and hip pain EXAM: LEFT SHOULDER - 1 VIEW COMPARISON:  None. FINDINGS: Unable to position patient for additional views due to pain. Single view demonstrates a  left humeral fracture through the surgical neck with likely fracture line extension into the greater tuberosity. There is significant overlying soft tissue swelling. Glenohumeral acromioclavicular alignment is maintained. Included portion of the left chest wall is unremarkable. The lungs demonstrate coarse interstitial opacities with aortic atherosclerosis. Cardiac leads overlie the chest. IMPRESSION: Limited views due to patient pain. Fracture through the surgical neck of the left humerus, alignment and fracture extension difficult to assess on single view. Electronically Signed   By: Lovena Le M.D.   On: 07/10/2019 22:10   Dg Hip Unilat W Or Wo Pelvis 2-3 Views Left  Result Date: 07/10/2019 CLINICAL DATA:  Fall to left side. Left shoulder and left hip pain. Osteoporosis. EXAM: DG HIP (WITH OR WITHOUT PELVIS) 2-3V LEFT COMPARISON:  None. FINDINGS: Intertrochanteric left femur fracture with mild varus angulation and foreshortening across the fracture line. Femoral heads remain normally located. Remaining bones of the pelvis are congruent. Evaluation the sacrum is limited by overlying bowel gas of the visualized sacral are not are maintained. Discogenic and facet degenerative changes are present in the included lumbar spine. Bowel gas pattern is nonobstructive. Vascular calcified fully bolus are noted in the pelvis. Corticated mineralization adjacent to the anterior superior iliac spine may reflect injection granuloma. IMPRESSION: Mildly foreshortened and varus angulated intertrochanteric left femur fracture. Electronically Signed   By: Lovena Le M.D.   On: 07/10/2019 22:07    Review of Systems  Constitutional: Negative for chills and fever.  Respiratory: Negative for shortness of breath and wheezing.   Cardiovascular: Negative for chest pain and palpitations.  Gastrointestinal: Negative for nausea and  vomiting.  Musculoskeletal: Positive for joint pain (Left hip and left shoulder pain ).   Neurological: Negative for tingling and sensory change.  Endo/Heme/Allergies:       Chronic anticoagulation with Eliquis for history of PE   Blood pressure 116/60, pulse (!) 55, temperature 98.2 F (36.8 C), temperature source Oral, resp. rate 16, height 5\' 3"  (1.6 m), weight 56.7 kg, SpO2 100 %. Physical Exam Vitals signs and nursing note reviewed.  Constitutional:      General: She is not in acute distress.    Appearance: Normal appearance.     Comments: Very pleasant 85 year old white female.  Appears appropriate for stated age,  generalized osteopenia but overall appears to be in very good condition.  HENT:     Head: Normocephalic and atraumatic.     Mouth/Throat:     Mouth: Mucous membranes are dry.  Eyes:     Extraocular Movements: Extraocular movements intact.  Neck:     Musculoskeletal: Normal range of motion. No neck rigidity or muscular tenderness.  Cardiovascular:     Rate and Rhythm: Normal rate and regular rhythm.  Pulmonary:     Effort: Pulmonary effort is normal. No respiratory distress.     Breath sounds: Normal breath sounds.  Chest:     Chest wall: No tenderness.  Abdominal:     General: Bowel sounds are normal.     Palpations: Abdomen is soft.     Tenderness: There is no abdominal tenderness.  Musculoskeletal:     Comments: Pelvis no traumatic wounds or rash, no ecchymosis, stable to manual stress, nontender  Left lower extremity  Inspection: Left hip/thigh is shortened and flexed, abducted and externally rotated No acute traumatic wounds appreciated Left knee, lower leg, ankle and foot are unremarkable  Bony eval: Tenderness with palpation of the left hip Distal femur, knee, proximal tibia, lower leg, ankle and foot are nontender  No crepitus or gross motion with gentle manipulation of the knee, lower leg, ankle or foot  Soft tissue: No traumatic wounds appreciated Difficult to maneuver the patient to evaluate the lateral soft tissues of her  proximal thigh  No knee effusion appreciated No ankle effusion No significant swelling distally  Sensation: DPN, SPN, TN sensory functions are intact  Motor: EHL, FHL, into tibialis, posterior tibialis, peroneals and gastrocsoleus complex motor functions are intact  Vascular: + DP pulse Extremity is warm Compartments are soft and nontender, no pain with passive stretching  Left upper extremity Inspection: Marked swelling to the left shoulder along with ecchymosis Abrasions to the elbow are noted as well. + senile purpura  Bony eval: Tenderness and crepitus noted with gentle manipulation of her left shoulder  Distal humerus, elbow, forearm, wrist and hand without pain.  No crepitus or gross motion noted with manipulation Elbow and wrist are stable with evaluation  Soft tissue: Swelling and effusion noted to the left shoulder  Superficial abrasions and senile purpura noted to the arm  No elbow or wrist effusion appreciated.  No other swelling to the forearm wrist or hand  ROM: Good passive range of motion of the elbow, forearm, wrist and hand  Patient tolerated very gentle passive shoulder abduction to about 30 degrees  Sensation: Radial, ulnar, median, axillary nerve sensory functions are intact  Motor: Radial, ulnar, median, AIN, PIN motor functions are intact  Patient unable to abduct her left shoulder due to pain   Vascular: Extremity is warm Good color distally + Radial pulse  Right Lower Extremity  no open wounds or lesions, no swelling or ecchymosis   Nontender hip, knee, ankle and foot             No crepitus or gross motion noted with manipulation of the R leg  No knee or ankle effusion             No pain with axial loading or logrolling of the hip.   Knee stable to varus/ valgus and anterior/posterior stress             No pain with manipulation of the ankle or foot             No blocks to motion noted             Patient is able to  perform active hip flexion, knee flexion and extension as well as ankle flexion extension without pain or difficulty.  Sens DPN, SPN, TN intact  Motor EHL, FHL, lesser toe motor, Ext, flex, evers 5/5  DP 2+, PT 2+, No significant edema             Compartments are soft and nontender, no pain with passive stretching  Right upper extremity UEx shoulder, elbow, wrist, digits- no skin wounds, nontender, no instability, no blocks to motion  Sens  Ax/R/M/U intact  Mot   Ax/ R/ PIN/ M/ AIN/ U intact  Rad 2+     Skin:    General: Skin is warm.     Capillary Refill: Capillary refill takes less than 2 seconds.  Neurological:     General: No focal deficit present.     Mental Status: She is alert.     Comments: Unable to assess coordination or gait  Psychiatric:        Mood and Affect: Mood normal.        Behavior: Behavior normal. Behavior is cooperative.        Thought Content: Thought content normal.      Assessment/Plan:  85 year old right-hand-dominant female status post fall with left intertrochanteric hip fracture and left proximal humerus fracture.  Baseline osteoporosis currently not on any pharmacologic treatment.  History estrogens and bisphosphonate  -fall  -Displaced left intertrochanteric hip fracture  Patient does have reasonably active at baseline.  She does ambulate but does do so with assistive devices.  We feel that surgery is warranted to give her the best opportunity to return to baseline function.  Patient does agree with this assessment.   Plan for OR today for intramedullary nailing of left hip fracture.  Patient will be weightbearing as tolerated postoperatively with no range of motion restrictions postoperatively.   PT and OT consults after surgery   Given her ipsilateral left proximal humerus fracture it may be difficult for her to use a walker.  There is concern that given her ipsilateral injuries her mobility may be severely restricted which could result in  overall decompensation and deconditioning.  Given her age and medical comorbidities she is at increased risk for complications following her hip fracture repair.   Will be important to optimize patient's nutrition for the healing process.  Is also imperative to encourage her to participate with therapies as well.  Want the patient to be as active as possible postoperatively  -Impacted left proximal humerus fracture  Plan for nonoperative treatment   Will obtain dedicated shoulder series to confirm this plan (AP, Y and axillary)  Nonweightbearing left upper extremity.  No lifting with left arm  Sling for comfort  Initiate early  shoulder pendulum exercises as pain allows   Will also likely initiate early passive shoulder motion as well  No resistance exercises until about 6 weeks post injury  We will start active and active assisted motion around the 4-week mark   Ice as needed for swelling and pain control    - Pain management:  Minimize narcotics  Postoperatively we will schedule Tylenol with breakthrough Norco available  IV morphine for severe breakthrough pain  - ABL anemia/Hemodynamics  Stable  Monitor  - Medical issues   Per medical service  - DVT/PE prophylaxis:  Resume Eliquis postop day 1  - ID:   Perioperative antibiotics  - Metabolic Bone Disease:  Baseline osteoporosis   Check vitamin D   Patient would likely benefit from repeat bone density scan   Fractures are indicative of osteoporosis   Continue with weightbearing exercises    Could consider agent such as Prolia or evenity   - Activity:  PT and OT to start postoperatively  - FEN/GI prophylaxis/Foley/Lines:  NPO   Resume diet after surgery  RD consult to evaluate for nutritional needs  - Impediments to fracture healing:  Baseline osteoporosis  Restricted mobility  Age  Chronic medical conditions  - Dispo:  OR today for IM nail left hip fracture.  Nonoperative treatment left proximal humerus    Weightbearing: WBAT LLE, nonweightbearing left upper extremity Insicional and dressing care: Daily dressing changes with 4x4s and gauze starting on 07/14/2019 Orthopedic device(s): Walker and sling Showering: ok to shower with assistance VTE prophylaxis: Resume Eliquis postoperative day #1 (07/12/2019)  (Chronic anticoagulation) Pain control: tylenol, norco Follow - up plan: 3 weeks Contact information:  Altamese Underwood MD, Ainsley Spinner PA-C   Jari Pigg, PA-C [phone] (C) 07/11/2019, 9:07 AM  [facility] [place] [address] [phone] Domingo Sep (F)

## 2019-07-11 NOTE — Transfer of Care (Signed)
Immediate Anesthesia Transfer of Care Note  Patient: Miranda Ford  Procedure(s) Performed: INTRAMEDULLARY (IM) NAIL INTERTROCHANTRIC (Left )  Patient Location: PACU  Anesthesia Type:General  Level of Consciousness: awake and alert   Airway & Oxygen Therapy: Patient Spontanous Breathing and Patient connected to face mask oxygen  Post-op Assessment: Report given to RN and Post -op Vital signs reviewed and stable  Post vital signs: Reviewed and stable  Last Vitals:  Vitals Value Taken Time  BP 135/64 07/11/19 1247  Temp    Pulse 56 07/11/19 1248  Resp 13 07/11/19 1248  SpO2 100 % 07/11/19 1248  Vitals shown include unvalidated device data.  Last Pain:  Vitals:   07/11/19 0730  TempSrc:   PainSc: Asleep      Patients Stated Pain Goal: 0 (17/61/60 7371)  Complications: No apparent anesthesia complications

## 2019-07-11 NOTE — Anesthesia Preprocedure Evaluation (Addendum)
Anesthesia Evaluation  Patient identified by MRN, date of birth, ID band Patient awake    Reviewed: Allergy & Precautions, NPO status , Patient's Chart, lab work & pertinent test results  Airway Mallampati: II  TM Distance: >3 FB Neck ROM: Full    Dental no notable dental hx.    Pulmonary COPD, former smoker,    breath sounds clear to auscultation + decreased breath sounds      Cardiovascular +CHF  Normal cardiovascular exam+ dysrhythmias  Rhythm:Regular Rate:Normal - Systolic murmurs EF 32%   Neuro/Psych negative neurological ROS  negative psych ROS   GI/Hepatic negative GI ROS, Neg liver ROS,   Endo/Other  negative endocrine ROS  Renal/GU negative Renal ROS  negative genitourinary   Musculoskeletal negative musculoskeletal ROS (+)   Abdominal   Peds negative pediatric ROS (+)  Hematology Anticoagulated on eliquis   Anesthesia Other Findings   Reproductive/Obstetrics negative OB ROS                            Anesthesia Physical Anesthesia Plan  ASA: IV  Anesthesia Plan: General   Post-op Pain Management:    Induction: Intravenous  PONV Risk Score and Plan: 2 and Ondansetron, Dexamethasone and Treatment may vary due to age or medical condition  Airway Management Planned: Oral ETT  Additional Equipment: Arterial line  Intra-op Plan:   Post-operative Plan: Extubation in OR  Informed Consent: I have reviewed the patients History and Physical, chart, labs and discussed the procedure including the risks, benefits and alternatives for the proposed anesthesia with the patient or authorized representative who has indicated his/her understanding and acceptance.     Dental advisory given  Plan Discussed with: CRNA and Surgeon  Anesthesia Plan Comments:         Anesthesia Quick Evaluation

## 2019-07-11 NOTE — Progress Notes (Signed)
PROGRESS NOTE    Miranda Ford  DXA:[id number] DOB: January 22, 1924 DOA: 07/10/2019 PCP: Sharilyn Sites, MD   Brief Narrative:  85 year old with history of bilateral cataract, systolic CHF, epistaxis, paroxysmal atrial fibrillation on amiodarone, pulmonary embolism, osteoporosis came to the ER after having an accidental fall while gardening.  She was found to have left humerus and left hip fracture.  Orthopedic consult for operative management.  Patient was taken to the OR on 07/11/2019.   Assessment & Plan:   Principal Problem:   Closed left hip fracture, initial encounter [facility]) Active Problems:   Coronary atherosclerosis of native coronary artery   Paroxysmal atrial tachycardia (HCC)   Senile osteoporosis   Chronic systolic CHF (congestive heart failure) (HCC)   History of pulmonary embolism   Bradycardia   Closed fracture of left proximal humerus  Left hip fracture secondary to mechanical fall - Plans for surgical intervention today.  Postop patient will need PT/OT.  She can resume oral diet.  Pain control, bowel regimen.  Encourage incentive spirometer.  Eliquis recommended by orthopedic  Left humerus fracture, acute - At this time recommending nonoperative management.  Nonweightbearing for the left upper extremity.  Sling for comfort.  History of coronary artery disease - Chest pain-free.  Stable.  P A fib History of pulmonary embolism -AC on hold. Resume Eliquis when cleared by Ortho.   Chronic systolic congestive heart failure, ejection fraction 25% -Stable.  Monitor for signs of fluid overload.  DVT prophylaxis: Per Ortho Code Status: DNR Family Communication:  Spoke with [name], patient's niece.  Disposition Plan: Maintain hosp stay for OR intervention.   Consultants:   Ortho  Procedures:   OR - ORIF  Antimicrobials:   None   Subjective: Spoke with [name], who tells me patient is mostly independent and only uses her walker sometimes. Lives at home alone.    Review of Systems Otherwise negative except as per HPI, including: General: Denies fever, chills, night sweats or unintended weight loss. Resp: Denies cough, wheezing, shortness of breath. Cardiac: Denies chest pain, palpitations, orthopnea, paroxysmal nocturnal dyspnea. GI: Denies abdominal pain, nausea, vomiting, diarrhea or constipation GU: Denies dysuria, frequency, hesitancy or incontinence MS: Denies muscle aches, joint pain or swelling Neuro: Denies headache, neurologic deficits (focal weakness, numbness, tingling), abnormal gait Psych: Denies anxiety, depression, SI/HI/AVH Skin: Denies new rashes or lesions ID: Denies sick contacts, exotic exposures, travel  Objective: Vitals:   07/11/19 0000 07/11/19 0030 07/11/19 0136 07/11/19 0358  BP: 113/63 (!) 121/57 116/67 116/60  Pulse: (!) 58 (!) 57 (!) 55 (!) 55  Resp: 16 15 16 16   Temp:   97.8 F (36.6 C) 98.2 F (36.8 C)  TempSrc:   Oral Oral  SpO2: 93% 91% 94% 100%  Weight:      Height:        Intake/Output Summary (Last 24 hours) at 07/11/2019 1239 Last data filed at 07/11/2019 1232 Gross per 24 hour  Intake 700 ml  Output 250 ml  Net 450 ml   Filed Weights   07/10/19 2023  Weight: 56.7 kg    Examination:  General exam: Discomfort due to pain, elderly frail-appearing. Respiratory system: Clear to auscultation. Respiratory effort normal. Cardiovascular system: S1 & S2 heard, RRR. No JVD, murmurs, rubs, gallops or clicks. No pedal edema. Gastrointestinal system: Abdomen is nondistended, soft and nontender. No organomegaly or masses felt. Normal bowel sounds heard. Central nervous system: Alert and oriented. No focal neurological deficits. Extremities: Limited range of motion of her left lower  extremity and left upper extremity due to pain and fracture.  Sling in place for left upper extremity. Skin: No rashes, lesions or ulcers Psychiatry: Judgement and insight appear normal. Mood & affect appropriate.     Data  Reviewed:   CBC: Recent Labs  Lab 07/10/19 2112  WBC 9.0  NEUTROABS 7.0  HGB 12.8  HCT 39.7  MCV 99.3  PLT 132   Basic Metabolic Panel: Recent Labs  Lab 07/10/19 2112  NA 138  K 4.5  CL 102  CO2 30  GLUCOSE 144*  BUN 29*  CREATININE 1.50*  CALCIUM 8.7*   GFR: Estimated Creatinine Clearance: 19.8 mL/min (A) (by C-G formula based on SCr of 1.5 mg/dL (H)). Liver Function Tests: No results for input(s): AST, ALT, ALKPHOS, BILITOT, PROT, ALBUMIN in the last 168 hours. No results for input(s): LIPASE, AMYLASE in the last 168 hours. No results for input(s): AMMONIA in the last 168 hours. Coagulation Profile: Recent Labs  Lab 07/10/19 2112  INR 1.0   Cardiac Enzymes: No results for input(s): CKTOTAL, CKMB, CKMBINDEX, TROPONINI in the last 168 hours. BNP (last 3 results) No results for input(s): PROBNP in the last 8760 hours. HbA1C: No results for input(s): HGBA1C in the last 72 hours. CBG: No results for input(s): GLUCAP in the last 168 hours. Lipid Profile: No results for input(s): CHOL, HDL, LDLCALC, TRIG, CHOLHDL, LDLDIRECT in the last 72 hours. Thyroid Function Tests: No results for input(s): TSH, T4TOTAL, FREET4, T3FREE, THYROIDAB in the last 72 hours. Anemia Panel: No results for input(s): VITAMINB12, FOLATE, FERRITIN, TIBC, IRON, RETICCTPCT in the last 72 hours. Sepsis Labs: No results for input(s): PROCALCITON, LATICACIDVEN in the last 168 hours.  Recent Results (from the past 240 hour(s))  SARS Coronavirus 2 (CEPHEID - Performed in [facility] hospital lab), Hosp Order     Status: None   Collection Time: 07/10/19 10:11 PM   Specimen: Nasopharyngeal Swab  Result Value Ref Range Status   SARS Coronavirus 2 NEGATIVE NEGATIVE Final    Comment: (NOTE) If result is NEGATIVE SARS-CoV-2 target nucleic acids are NOT DETECTED. The SARS-CoV-2 RNA is generally detectable in upper and lower  respiratory specimens during the acute phase of infection. The lowest   concentration of SARS-CoV-2 viral copies this assay can detect is 250  copies / mL. A negative result does not preclude SARS-CoV-2 infection  and should not be used as the sole basis for treatment or other  patient management decisions.  A negative result may occur with  improper specimen collection / handling, submission of specimen other  than nasopharyngeal swab, presence of viral mutation(s) within the  areas targeted by this assay, and inadequate number of viral copies  (<250 copies / mL). A negative result must be combined with clinical  observations, patient history, and epidemiological information. If result is POSITIVE SARS-CoV-2 target nucleic acids are DETECTED. The SARS-CoV-2 RNA is generally detectable in upper and lower  respiratory specimens dur ing the acute phase of infection.  Positive  results are indicative of active infection with SARS-CoV-2.  Clinical  correlation with patient history and other diagnostic information is  necessary to determine patient infection status.  Positive results do  not rule out bacterial infection or co-infection with other viruses. If result is PRESUMPTIVE POSTIVE SARS-CoV-2 nucleic acids MAY BE PRESENT.   A presumptive positive result was obtained on the submitted specimen  and confirmed on repeat testing.  While 2019 novel coronavirus  (SARS-CoV-2) nucleic acids may be present in the  submitted sample  additional confirmatory testing may be necessary for epidemiological  and / or clinical management purposes  to differentiate between  SARS-CoV-2 and other Sarbecovirus currently known to infect humans.  If clinically indicated additional testing with an alternate test  methodology [phone]) is advised. The SARS-CoV-2 RNA is generally  detectable in upper and lower respiratory sp ecimens during the acute  phase of infection. The expected result is Negative. Fact Sheet for Patients:  StrictlyIdeas.no Fact Sheet  for Healthcare Providers: BankingDealers.co.za This test is not yet approved or cleared by the [place] FDA and has been authorized for detection and/or diagnosis of SARS-CoV-2 by FDA under an Emergency Use Authorization (EUA).  This EUA will remain in effect (meaning this test can be used) for the duration of the COVID-19 declaration under Section 564(b)(1) of the Act, 21 U.S.C. section 360bbb-3(b)(1), unless the authorization is terminated or revoked sooner. Performed at [facility], [address] [place], [place] [address]   Surgical PCR screen     Status: None   Collection Time: 07/11/19  5:45 AM   Specimen: Nasal Mucosa; Nasal Swab  Result Value Ref Range Status   MRSA, PCR NEGATIVE NEGATIVE Final   Staphylococcus aureus NEGATIVE NEGATIVE Final    Comment: (NOTE) The Xpert SA Assay (FDA approved for NASAL specimens in patients 70 years of age and older), is one component of a comprehensive surveillance program. It is not intended to diagnose infection nor to guide or monitor treatment. Performed at [facility], [place] [address], [place], [place] [address]          Radiology Studies: Dg Chest 1 View  Result Date: 07/10/2019 CLINICAL DATA:  Fall to left side.  Left shoulder and hip pain EXAM: CHEST  1 VIEW COMPARISON:  Chest radiograph 07/11/2016, chest CT 07/03/2016 FINDINGS: Redemonstration of the fibrotic and emphysematous changes lungs seen on prior radiograph and cross-sectional imaging. No focal consolidative process. No pneumothorax or effusion. No visible displaced rib fracture. Cardiac size is at the upper limits of normal the possibly accentuated by supine imaging. The aorta is calcified and tortuous. Mild dextrocurvature of the lower thoracic spine with discogenic changes. IMPRESSION: No acute cardiopulmonary abnormality. Chronic coarse interstitial changes. Emphysema (ICD10-J43.9). Aortic Atherosclerosis (ICD10-I70.0). Electronically  Signed   By: Lovena Le M.D.   On: 07/10/2019 22:13   Dg Shoulder 1v Left  Result Date: 07/10/2019 CLINICAL DATA:  Fall to left side, left shoulder and hip pain EXAM: LEFT SHOULDER - 1 VIEW COMPARISON:  None. FINDINGS: Unable to position patient for additional views due to pain. Single view demonstrates a left humeral fracture through the surgical neck with likely fracture line extension into the greater tuberosity. There is significant overlying soft tissue swelling. Glenohumeral acromioclavicular alignment is maintained. Included portion of the left chest wall is unremarkable. The lungs demonstrate coarse interstitial opacities with aortic atherosclerosis. Cardiac leads overlie the chest. IMPRESSION: Limited views due to patient pain. Fracture through the surgical neck of the left humerus, alignment and fracture extension difficult to assess on single view. Electronically Signed   By: Lovena Le M.D.   On: 07/10/2019 22:10   Dg Hip Unilat W Or Wo Pelvis 2-3 Views Left  Result Date: 07/10/2019 CLINICAL DATA:  Fall to left side. Left shoulder and left hip pain. Osteoporosis. EXAM: DG HIP (WITH OR WITHOUT PELVIS) 2-3V LEFT COMPARISON:  None. FINDINGS: Intertrochanteric left femur fracture with mild varus angulation and foreshortening across the fracture line. Femoral heads remain normally  located. Remaining bones of the pelvis are congruent. Evaluation the sacrum is limited by overlying bowel gas of the visualized sacral are not are maintained. Discogenic and facet degenerative changes are present in the included lumbar spine. Bowel gas pattern is nonobstructive. Vascular calcified fully bolus are noted in the pelvis. Corticated mineralization adjacent to the anterior superior iliac spine may reflect injection granuloma. IMPRESSION: Mildly foreshortened and varus angulated intertrochanteric left femur fracture. Electronically Signed   By: Lovena Le M.D.   On: 07/10/2019 22:07        Scheduled  Meds: . [MAR Hold] amiodarone  100 mg Oral Daily  . [MAR Hold] calcium carbonate  1,250 mg Oral Q breakfast  . [START ON 07/12/2019] feeding supplement (ENSURE ENLIVE)  237 mL Oral BID BM  . [MAR Hold] mupirocin ointment  1 application Nasal BID  . [MAR Hold] spironolactone  12.5 mg Oral QODAY   Continuous Infusions: . lactated ringers 10 mL/hr at 07/11/19 0908     LOS: 1 day   Time spent= 35 mins    Ankit Arsenio Loader, MD [facility]  If 7PM-7AM, please contact night-coverage [email or web address] 07/11/2019, 12:39 PM

## 2019-07-11 NOTE — Op Note (Signed)
07/11/2019  1:50 PM  PATIENT:  Miranda Ford  85 y.o. female  PRE-OPERATIVE DIAGNOSIS:  Left intertrochanteric femur fracture  POST-OPERATIVE DIAGNOSIS:  Left intertrochanteric femur fracture  PROCEDURE:  Procedure(s): INTRAMEDULLARY (IM) NAIL INTERTROCHANTRIC (Left) with Biomet Affixus 11 x 360 mm   SURGEON:  Surgeon(s) and Role:    Altamese [place], MD - Primary  PHYSICIAN ASSISTANT: Ainsley Spinner, PA-C  ANESTHESIA:   general  EBL:  100 mL   BLOOD ADMINISTERED:none  DRAINS: none   LOCAL MEDICATIONS USED:  NONE  SPECIMEN:  No Specimen  DISPOSITION OF SPECIMEN:  N/A  COUNTS:  YES  TOURNIQUET:  * No tourniquets in log *  DICTATION: .Note written in EPIC  PLAN OF CARE: Admit to inpatient   PATIENT DISPOSITION:  PACU - hemodynamically stable.   Delay start of Pharmacological VTE agent (>24hrs) due to surgical blood loss or risk of bleeding: no  BRIEF SUMMARY AND INDICATION OF PROCEDURE:  Miranda Ford is a 85 y.o. year- old with multiple medical problems.  I discussed with the patient and family risks and benefits of surgical treatment including the potential for malunion, nonunion, symptomatic hardware, heart attack, stroke, neurovascular injury, bleeding, and others.  After full discussion, the patient and family wished to proceed.  BRIEF SUMMARY OF PROCEDURE:  The patient was taken to the operating room where general anesthesia was induced.  She was positioned supine on the Hana fracture table.  A closed reduction maneuver was performed of the fractured proximal femur and this was confirmed on both AP and lateral xray views. Reduction was challenging to obtain, specifically with respect to rotation. A thorough scrub and wash with chlorhexidine and then Betadine scrub and paint was performed.  After sterile drapes and time-out, a long instrument was used to identify the appropriate starting position under C-arm on both AP and lateral images.  A 3 cm incision was  made proximal to the greater trochanter.  The curved cannulated awl was inserted just medial to the tip of the lateral trochanter and then the starting guidewire advanced into the proximal femur.  This was checked on AP and lateral views.  The starting reamer was engaged with the soft tissue protected by a sleeve.  The curved ball-tipped guidewire was then inserted, making sure it was just posterior as possible in the distal femur and across the fracture site, which stayed in a reduced position.  It was sequentially reamed up to 13 and an 11 mm nail inserted to the appropriate depth.  The guidewire for the lag screw was then inserted with the appropriate anteversion to make sure it was in a center-center position.  This was measured and the lag screw placed with excellent purchase after placement of the antirotation pin parallel to the lag screw. Position was checked on both views.  The antirotation screw was then engaged within the groove of the lag screw, which was allowed to telescope.  Traction was released and compression achieved with the screw.  This was followed by placement of one distal locking screw using perfect circle technique. An attempt was made at the static hole but the drill passage seemed likely to induce stress. Consequently we abandoned this hole and placed the more distal lock. This was confirmed on AP and lateral images. Wounds were irrigated thoroughly, closed in a standard layered fashion. Sterile gently compressive dressings were applied.  Ainsley Spinner, PA-C, assisted throughout.  The patient was awakened from anesthesia and transported to the PACU in stable  condition.  PROGNOSIS:  The patient will be weightbearing as tolerated with physical therapy on the LLE but NWB through the left proximal humerus fracture, resuming Eliquis tomorrow or as soon as deemed stable by the Primary Care Service.  She has no range of motion precautions with the hip but will be pendulum with  the shoulder.  We will continue to follow through at the hospital.  Anticipate follow up in the office in 2 weeks for removal of sutures and further evaluation. Because of her combined injuries and underlying comorbidities we expect SNF or rehab placement before returning to her home.   Miranda Ford. Miranda Ford, M.D.

## 2019-07-11 NOTE — Plan of Care (Signed)
  Problem: Education: Goal: Knowledge of General Education information will improve Description Including pain rating scale, medication(s)/side effects and non-pharmacologic comfort measures Outcome: Progressing   

## 2019-07-11 NOTE — Anesthesia Procedure Notes (Signed)
Procedure Name: Intubation Date/Time: 07/11/2019 10:47 AM Performed by: Wilburn Cornelia, CRNA Pre-anesthesia Checklist: Patient identified, Emergency Drugs available, Suction available, Patient being monitored and Timeout performed Patient Re-evaluated:Patient Re-evaluated prior to induction Oxygen Delivery Method: Circle system utilized Preoxygenation: Pre-oxygenation with 100% oxygen Induction Type: IV induction Ventilation: Mask ventilation without difficulty Laryngoscope Size: Mac and 3 Grade View: Grade I Tube type: Oral Tube size: 7.0 mm Number of attempts: 1 Airway Equipment and Method: Stylet Placement Confirmation: ETT inserted through vocal cords under direct vision,  positive ETCO2,  CO2 detector and breath sounds checked- equal and bilateral Secured at: 21 cm Tube secured with: Tape Dental Injury: Teeth and Oropharynx as per pre-operative assessment

## 2019-07-12 DIAGNOSIS — N189 Chronic kidney disease, unspecified: Secondary | ICD-10-CM

## 2019-07-12 DIAGNOSIS — I5022 Chronic systolic (congestive) heart failure: Secondary | ICD-10-CM

## 2019-07-12 DIAGNOSIS — D62 Acute posthemorrhagic anemia: Secondary | ICD-10-CM

## 2019-07-12 DIAGNOSIS — D696 Thrombocytopenia, unspecified: Secondary | ICD-10-CM

## 2019-07-12 DIAGNOSIS — Z86711 Personal history of pulmonary embolism: Secondary | ICD-10-CM

## 2019-07-12 DIAGNOSIS — I471 Supraventricular tachycardia: Secondary | ICD-10-CM

## 2019-07-12 DIAGNOSIS — N179 Acute kidney failure, unspecified: Secondary | ICD-10-CM

## 2019-07-12 DIAGNOSIS — I428 Other cardiomyopathies: Secondary | ICD-10-CM

## 2019-07-12 LAB — BASIC METABOLIC PANEL
Anion gap: 8 (ref 5–15)
BUN: 27 mg/dL — ABNORMAL HIGH (ref 8–23)
CO2: 26 mmol/L (ref 22–32)
Calcium: 8.2 mg/dL — ABNORMAL LOW (ref 8.9–10.3)
Chloride: 100 mmol/L (ref 98–111)
Creatinine, Ser: 1.32 mg/dL — ABNORMAL HIGH (ref 0.44–1.00)
GFR calc Af Amer: 40 mL/min — ABNORMAL LOW (ref >60)
GFR calc non Af Amer: 35 mL/min — ABNORMAL LOW (ref >60)
Glucose, Bld: 128 mg/dL — ABNORMAL HIGH (ref 70–99)
Potassium: 4.6 mmol/L (ref 3.5–5.1)
Sodium: 134 mmol/L — ABNORMAL LOW (ref 135–145)

## 2019-07-12 LAB — CBC
HCT: 31.4 % — ABNORMAL LOW (ref 36.0–46.0)
Hemoglobin: 9.9 g/dL — ABNORMAL LOW (ref 12.0–15.0)
MCH: 31.5 pg (ref 26.0–34.0)
MCHC: 31.5 g/dL (ref 30.0–36.0)
MCV: 100 fL (ref 80.0–100.0)
Platelets: 127 10*3/uL — ABNORMAL LOW (ref 150–400)
RBC: 3.14 MIL/uL — ABNORMAL LOW (ref 3.87–5.11)
RDW: 14 % (ref 11.5–15.5)
WBC: 11.9 10*3/uL — ABNORMAL HIGH (ref 4.0–10.5)
nRBC: 0 % (ref 0.0–0.2)

## 2019-07-12 MED ORDER — LACTATED RINGERS IV SOLN
INTRAVENOUS | Status: AC
  Administered 2019-07-12: 13:00:00 via INTRAVENOUS

## 2019-07-12 NOTE — Plan of Care (Addendum)
  Problem: Education: Goal: Knowledge of General Education information will improve Description: Including pain rating scale, medication(s)/side effects and non-pharmacologic comfort measures 07/12/2019 0334 by Claire Shown, RN Outcome: Progressing  Problem: Health Behavior/Discharge Planning: Goal: Ability to manage health-related needs will improve Outcome: Progressing   Problem: Clinical Measurements: Goal: Will remain free from infection 07/12/2019 0334 by Claire Shown, RN Outcome: Progressing   Problem: Activity: Goal: Risk for activity intolerance will decrease Outcome: Progressing   Problem: Skin Integrity: Goal: Risk for impaired skin integrity will decrease 07/12/2019 0334 by Claire Shown, RN Outcome: Progressing

## 2019-07-12 NOTE — Progress Notes (Signed)
PROGRESS NOTE   Miranda Ford  TIR:[id number]    DOB: Sep 03, 1924    DOA: 07/10/2019  PCP: Sharilyn Sites, MD   I have briefly reviewed patients previous medical records in [facility].  Chief Complaint  Patient presents with   Fall    Brief Narrative:  85 year old female, lives alone, fairly independent, uses a cane when she goes out of her house, PMH of NICM, chronic systolic CHF, PAT, pulmonary embolism on long-term apixaban per PCP presented to the ED on 07/10/2019 after a mechanical fall to her left side while she was gardening without her cane.  She was admitted for closed left intertrochanteric femur fracture and closed left distal humerus fracture.  S/p IM nail/ORIF of left hip fracture on 7/17.  Left humerus fracture for nonoperative management.   Assessment & Plan:   Principal Problem:   Closed left hip fracture, initial encounter [facility]) Active Problems:   Coronary atherosclerosis of native coronary artery   Paroxysmal atrial tachycardia (HCC)   Senile osteoporosis   Chronic systolic CHF (congestive heart failure) (HCC)   History of pulmonary embolism   Bradycardia   Closed fracture of left proximal humerus   Closed left intertrochanteric femur fracture Sustained status post mechanical fall in the context of underlying osteoporosis. Orthopedics consulted and patient underwent ORIF/IM nail on 7/17 As per orthopedic follow-up: WBAT LLE, Mepilex dressing PRN, keep dressing dry, Eliquis resumed and await mobilization and PT evaluation. Pain control/minimize narcotic use. Outpatient follow-up with Dr. Marcelino Scot in 1 to 2 weeks.  Acute left humerus fracture As per orthopedic follow-up, nonoperative management. NWB LUE.  No lifting of left arm.  Sling for comfort.  Initiate early shoulder pendulum exercises and passive movement as pain allows.  No resistance exercise for 6 weeks.  Likely start active assisted motion in 4 weeks.  Postop acute blood loss anemia Secondary to  fracture and recent surgery. Hemoglobin dropped from 12.8 preop to 9.9.  Asymptomatic. Follow CBC in a.m. and transfuse if hemoglobin 7 or less.  Thrombocytopenia, mild Likely related to acute blood loss.  No overt bleeding.  Follow CBC in a.m.  Acute kidney injury complicating stage III chronic kidney disease Baseline creatinine in the 1.1 range. Creatinine increased to 1.5 on 7/16.  This is improved to 1.3 today.  Brief and gentle IV fluids, encourage oral fluid intake and follow BMP in a.m.  NICM/chronic systolic CHF Follows with Dr. Domenic Polite, Cardiology Clinically euvolemic.  On spironolactone 12.5 mg every other day.  Paroxysmal atrial tachycardia Currently in sinus rhythm.  Continue low-dose amiodarone.  Although OP cardiology follow-up from December 2019 indicates patient on low-dose carvedilol, she does not appear to be on it.  History of pulmonary embolus Remains on long-term Eliquis as per her PCP.  COPD Noted on chest x-ray.  Currently on 3 L/min oxygen and saturating in the 90s.  Aggressive incentive spirometry use and wean off oxygen as tolerated.  Osteoporosis  Continue calcium supplements.  Consider bisphosphonates.  Outpatient bone bone density check with PCP.   DVT prophylaxis: Eliquis Code Status: DNR Family Communication: Discussed in detail with patient's niece/HCPOA, updated care and answered questions. Disposition: To be determined pending clinical improvement and therapies evaluation.  Will likely need SNF/CIR for STR.   Consultants:  Orthopedics  Procedures:  IM nail for left hip fracture on 7/17 Left shoulder sling for humerus fracture.  Antimicrobials:  None.   Subjective: Interviewed and examined along with her RN at bedside.  Mild pain at left  upper extremity and postop left lower extremity which improved after Tylenol.  States that she is drinking lots of fluids.  No dyspnea, chest pain, dizziness or lightheadedness  reported.   Objective:  Vitals:   07/11/19 1700 07/11/19 2059 07/12/19 0415 07/12/19 0821  BP:  (!) 95/57 116/67 (!) 111/53  Pulse:  72 63 72  Resp:    14  Temp:  97.8 F (36.6 C) 97.9 F (36.6 C) 98 F (36.7 C)  TempSrc:  Oral Oral Oral  SpO2: 93% 97% 97% 93%  Weight:      Height:        Examination:  General exam: Pleasant elderly female, moderately built and frail lying comfortably propped up in bed without distress. Respiratory system: Clear to auscultation. Respiratory effort normal. Cardiovascular system: S1 & S2 heard, RRR. No JVD, murmurs, rubs, gallops or clicks. No pedal edema.  Telemetry personally reviewed: Sinus rhythm with bundle branch block morphology. Gastrointestinal system: Abdomen is nondistended, soft and nontender. No organomegaly or masses felt. Normal bowel sounds heard. Central nervous system: Alert and oriented. No focal neurological deficits. Extremities: Right limbs grade 5 x 5 power.  Left upper extremity in sling but normal movements and strength of left fingers and wrist.  Left lower extremity power assessment limited secondary to postop pain.  Top of the 3 procedure site dressing minimally soiled with dried blood. Skin: No rashes, lesions or ulcers Psychiatry: Judgement and insight appear normal. Mood & affect appropriate.     Data Reviewed: I have personally reviewed following labs and imaging studies  CBC: Recent Labs  Lab 07/10/19 2112 07/12/19 0411  WBC 9.0 11.9*  NEUTROABS 7.0  --   HGB 12.8 9.9*  HCT 39.7 31.4*  MCV 99.3 100.0  PLT 166 353*   Basic Metabolic Panel: Recent Labs  Lab 07/10/19 2112 07/12/19 0728  NA 138 134*  K 4.5 4.6  CL 102 100  CO2 30 26  GLUCOSE 144* 128*  BUN 29* 27*  CREATININE 1.50* 1.32*  CALCIUM 8.7* 8.2*   Liver Function Tests: No results for input(s): AST, ALT, ALKPHOS, BILITOT, PROT, ALBUMIN in the last 168 hours. Coagulation Profile: Recent Labs  Lab 07/10/19 2112  INR 1.0      Recent Results (from the past 240 hour(s))  SARS Coronavirus 2 (CEPHEID - Performed in [facility] hospital lab), Hosp Order     Status: None   Collection Time: 07/10/19 10:11 PM   Specimen: Nasopharyngeal Swab  Result Value Ref Range Status   SARS Coronavirus 2 NEGATIVE NEGATIVE Final    Comment: (NOTE) If result is NEGATIVE SARS-CoV-2 target nucleic acids are NOT DETECTED. The SARS-CoV-2 RNA is generally detectable in upper and lower  respiratory specimens during the acute phase of infection. The lowest  concentration of SARS-CoV-2 viral copies this assay can detect is 250  copies / mL. A negative result does not preclude SARS-CoV-2 infection  and should not be used as the sole basis for treatment or other  patient management decisions.  A negative result may occur with  improper specimen collection / handling, submission of specimen other  than nasopharyngeal swab, presence of viral mutation(s) within the  areas targeted by this assay, and inadequate number of viral copies  (<250 copies / mL). A negative result must be combined with clinical  observations, patient history, and epidemiological information. If result is POSITIVE SARS-CoV-2 target nucleic acids are DETECTED. The SARS-CoV-2 RNA is generally detectable in upper and lower  respiratory specimens dur  ing the acute phase of infection.  Positive  results are indicative of active infection with SARS-CoV-2.  Clinical  correlation with patient history and other diagnostic information is  necessary to determine patient infection status.  Positive results do  not rule out bacterial infection or co-infection with other viruses. If result is PRESUMPTIVE POSTIVE SARS-CoV-2 nucleic acids MAY BE PRESENT.   A presumptive positive result was obtained on the submitted specimen  and confirmed on repeat testing.  While 2019 novel coronavirus  (SARS-CoV-2) nucleic acids may be present in the submitted sample  additional  confirmatory testing may be necessary for epidemiological  and / or clinical management purposes  to differentiate between  SARS-CoV-2 and other Sarbecovirus currently known to infect humans.  If clinically indicated additional testing with an alternate test  methodology [phone]) is advised. The SARS-CoV-2 RNA is generally  detectable in upper and lower respiratory sp ecimens during the acute  phase of infection. The expected result is Negative. Fact Sheet for Patients:  StrictlyIdeas.no Fact Sheet for Healthcare Providers: BankingDealers.co.za This test is not yet approved or cleared by the [place] FDA and has been authorized for detection and/or diagnosis of SARS-CoV-2 by FDA under an Emergency Use Authorization (EUA).  This EUA will remain in effect (meaning this test can be used) for the duration of the COVID-19 declaration under Section 564(b)(1) of the Act, 21 U.S.C. section 360bbb-3(b)(1), unless the authorization is terminated or revoked sooner. Performed at [facility], [address], [place], [place] [address]   Surgical PCR screen     Status: None   Collection Time: 07/11/19  5:45 AM   Specimen: Nasal Mucosa; Nasal Swab  Result Value Ref Range Status   MRSA, PCR NEGATIVE NEGATIVE Final   Staphylococcus aureus NEGATIVE NEGATIVE Final    Comment: (NOTE) The Xpert SA Assay (FDA approved for NASAL specimens in patients 76 years of age and older), is one component of a comprehensive surveillance program. It is not intended to diagnose infection nor to guide or monitor treatment. Performed at [facility], [place] [address], [place], [place] [address]          Radiology Studies: Dg Chest 1 View  Result Date: 07/10/2019 CLINICAL DATA:  Fall to left side.  Left shoulder and hip pain EXAM: CHEST  1 VIEW COMPARISON:  Chest radiograph 07/11/2016, chest CT 07/03/2016 FINDINGS: Redemonstration of the fibrotic and  emphysematous changes lungs seen on prior radiograph and cross-sectional imaging. No focal consolidative process. No pneumothorax or effusion. No visible displaced rib fracture. Cardiac size is at the upper limits of normal the possibly accentuated by supine imaging. The aorta is calcified and tortuous. Mild dextrocurvature of the lower thoracic spine with discogenic changes. IMPRESSION: No acute cardiopulmonary abnormality. Chronic coarse interstitial changes. Emphysema (ICD10-J43.9). Aortic Atherosclerosis (ICD10-I70.0). Electronically Signed   By: Lovena Le M.D.   On: 07/10/2019 22:13   Dg Shoulder 1v Left  Result Date: 07/10/2019 CLINICAL DATA:  Fall to left side, left shoulder and hip pain EXAM: LEFT SHOULDER - 1 VIEW COMPARISON:  None. FINDINGS: Unable to position patient for additional views due to pain. Single view demonstrates a left humeral fracture through the surgical neck with likely fracture line extension into the greater tuberosity. There is significant overlying soft tissue swelling. Glenohumeral acromioclavicular alignment is maintained. Included portion of the left chest wall is unremarkable. The lungs demonstrate coarse interstitial opacities with aortic atherosclerosis. Cardiac leads overlie the chest. IMPRESSION: Limited views due to patient pain.  Fracture through the surgical neck of the left humerus, alignment and fracture extension difficult to assess on single view. Electronically Signed   By: Lovena Le M.D.   On: 07/10/2019 22:10   Pelvis Portable  Result Date: 07/11/2019 CLINICAL DATA:  Postoperative left hip ORIF. EXAM: PORTABLE PELVIS 1-2 VIEWS COMPARISON:  Same day operative fluoroscopy FINDINGS: Postsurgical changes from a left femoral ORIF with intramedullary nail and transcervical fixation rod. Overlying soft tissue swelling and gas are expected postoperatively. Portions of the ischial tuberosities collimated. Remaining visualized bones of the pelvis appear congruent  although the sacrum is poorly evaluated due to overlying bowel gas of the visual sacral arcs are intact. Discogenic changes are present in the lower lumbar spine most pronounced at L4-5 L5-S1. Mild degenerative changes in both hips. Cholecystectomy clips in the right upper quadrant. Bowel gas pattern is normal. IMPRESSION: Expected postoperative appearance following left femoral open reduction internal fixation. Electronically Signed   By: Lovena Le M.D.   On: 07/11/2019 16:22   Dg Shoulder Left  Result Date: 07/11/2019 CLINICAL DATA:  Fracture of the left humerus seen 1 day prior EXAM: LEFT SHOULDER - 2+ VIEW COMPARISON:  Left shoulder radiograph 07/10/2019 FINDINGS: Redemonstration of a minimally comminuted fracture through the surgical neck of the left humerus with involvement of the greater and lesser tuberosities. There is foreshortening and slight anterior displacement across the fracture line. Anteroinferior displacement of the humeral head relative to the glenoid is new from prior exam. IMPRESSION: Redemonstration of a comminuted, foreshortened fracture through the surgical neck left humerus Inferior displacement of the humeral head likely reflects left shoulder effusion/hemarthrosis. Electronically Signed   By: Lovena Le M.D.   On: 07/11/2019 16:27   Dg C-arm 1-60 Min  Result Date: 07/11/2019 CLINICAL DATA:  The patient suffered a left intertrochanteric fracture in a fall 07/10/2019. Intraoperative imaging fixation. Initial encounter. EXAM: LEFT FEMUR 2 VIEWS; DG C-ARM 61-120 MIN COMPARISON:  Plain films left hip 07/10/2019. FINDINGS: Six fluoroscopic spot views of the left hip demonstrate placement of a hip screw and long intramedullary nail with a single distal screw for fixation of an intertrochanteric fracture. Hardware is intact. Position and alignment anatomic. No acute finding. IMPRESSION: Intraoperative imaging for fixation of a left intertrochanteric fracture. No acute finding.  Electronically Signed   By: Inge Rise M.D.   On: 07/11/2019 13:04   Dg Hip Unilat W Or Wo Pelvis 2-3 Views Left  Result Date: 07/10/2019 CLINICAL DATA:  Fall to left side. Left shoulder and left hip pain. Osteoporosis. EXAM: DG HIP (WITH OR WITHOUT PELVIS) 2-3V LEFT COMPARISON:  None. FINDINGS: Intertrochanteric left femur fracture with mild varus angulation and foreshortening across the fracture line. Femoral heads remain normally located. Remaining bones of the pelvis are congruent. Evaluation the sacrum is limited by overlying bowel gas of the visualized sacral are not are maintained. Discogenic and facet degenerative changes are present in the included lumbar spine. Bowel gas pattern is nonobstructive. Vascular calcified fully bolus are noted in the pelvis. Corticated mineralization adjacent to the anterior superior iliac spine may reflect injection granuloma. IMPRESSION: Mildly foreshortened and varus angulated intertrochanteric left femur fracture. Electronically Signed   By: Lovena Le M.D.   On: 07/10/2019 22:07   Dg Femur Min 2 Views Left  Result Date: 07/11/2019 CLINICAL DATA:  The patient suffered a left intertrochanteric fracture in a fall 07/10/2019. Intraoperative imaging fixation. Initial encounter. EXAM: LEFT FEMUR 2 VIEWS; DG C-ARM 61-120 MIN COMPARISON:  Plain films left  hip 07/10/2019. FINDINGS: Six fluoroscopic spot views of the left hip demonstrate placement of a hip screw and long intramedullary nail with a single distal screw for fixation of an intertrochanteric fracture. Hardware is intact. Position and alignment anatomic. No acute finding. IMPRESSION: Intraoperative imaging for fixation of a left intertrochanteric fracture. No acute finding. Electronically Signed   By: Inge Rise M.D.   On: 07/11/2019 13:04   Dg Femur Port Min 2 Views Left  Result Date: 07/11/2019 CLINICAL DATA:  Postoperative radiograph left hip open reduction internal fixation. EXAM: LEFT FEMUR  PORTABLE 2 VIEWS COMPARISON:  Radiograph 07/10/2019, same day operative fluoroscopy. FINDINGS: Postsurgical changes from a left femoral open reduction internal fixation transfixing the intertrochanteric left femur fracture identified 1 day prior. Hardware appears intact without acute postsurgical complication. Included portions of the pelvis are unremarkable. There is soft tissue gas along the lateral thigh, expected postoperatively. Catheter tubing projects over the pelvis. Vascular calcium noted in the medial thighs. IMPRESSION: Expected postoperative appearance following left femoral open reduction internal fixation of an intertrochanteric femur fracture. Electronically Signed   By: Lovena Le M.D.   On: 07/11/2019 16:24        Scheduled Meds:  acetaminophen  650 mg Oral Q8H   amiodarone  100 mg Oral Daily   apixaban  2.5 mg Oral BID   calcium carbonate  1,250 mg Oral Q breakfast   cholecalciferol  2,000 Units Oral BID   docusate sodium  100 mg Oral BID   feeding supplement (ENSURE ENLIVE)  237 mL Oral BID BM   mupirocin ointment  1 application Nasal BID   spironolactone  12.5 mg Oral QODAY   vitamin C  500 mg Oral Daily   Continuous Infusions:  lactated ringers 10 mL/hr at 07/12/19 1015     LOS: 2 days     Vernell Leep, MD, FACP, [facility]. [facility]  To contact the attending provider between 7A-7P or the covering provider during after hours 7P-7A, please log into the web site [email or web address] and access using universal [place] password for that web site. If you do not have the password, please call the hospital operator.  07/12/2019, 11:32 AM

## 2019-07-12 NOTE — Plan of Care (Signed)
Acute rehab goals established. 

## 2019-07-12 NOTE — Plan of Care (Signed)
  Problem: Pain Managment: Goal: General experience of comfort will improve Outcome: Progressing   

## 2019-07-12 NOTE — NC FL2 (Signed)
[place] LEVEL OF CARE SCREENING TOOL     IDENTIFICATION  Patient Name: Miranda Ford Birthdate: 04/09/1924 Sex: female Admission Date (Current Location): 07/10/2019  [facility] and [place] Number:  Herbalist and Address:  [facility], [place] [address] [place], [place] [address]      Provider Number: [id number]  Attending Physician Name and Address:  Modena Jansky, MD  Relative Name and Phone Number:  [name] [phone]    Current Level of Care: Hospital Recommended Level of Care: [place] Prior Approval Number:    Date Approved/Denied:   PASRR Number: [id number]  Discharge Plan: SNF    Current Diagnoses: Patient Active Problem List   Diagnosis Date Noted  . Closed fracture of left proximal humerus 07/11/2019  . Closed left hip fracture, initial encounter ([place]) 07/10/2019  . Chronic systolic CHF (congestive heart failure) ([place])   . History of pulmonary embolism   . Bradycardia   . Senile osteoporosis 07/18/2016  . DNR (do not resuscitate) discussion   . Palliative care encounter   . Epistaxis 07/07/2016  . Acute systolic CHF (congestive heart failure) ([place]) 07/05/2016  . Acute respiratory failure with hypoxia ([place]) 07/05/2016  . Secondary cardiomyopathy ([place])   . Biventricular failure ([place])   . Atrial tachycardia ([place])   . Paroxysmal atrial tachycardia ([place])   . Demand ischemia ([place])   . Acute pulmonary embolism ([place]) 07/03/2016  . COPD exacerbation ([place]) 07/03/2016  . Cardiomegaly 07/03/2016  . Coronary atherosclerosis of native coronary artery 07/03/2016  . Elevated troponin     Orientation RESPIRATION BLADDER Height & Weight     Self, Time, Situation, Place  O2 External catheter, Continent Weight: 125 lb (56.7 kg) Height:  5\' 3"  (160 cm)  BEHAVIORAL SYMPTOMS/MOOD NEUROLOGICAL BOWEL NUTRITION STATUS      Continent Diet(Regular)  AMBULATORY STATUS COMMUNICATION OF NEEDS Skin   Extensive  Assist Verbally Other (Comment)(Dry, surgical incision, silcone dressing)                       Personal Care Assistance Level of Assistance  Bathing, Dressing, Feeding Bathing Assistance: Maximum assistance Feeding assistance: Limited assistance Dressing Assistance: Maximum assistance     Functional Limitations Info  Sight, Hearing, Speech Sight Info: Adequate Hearing Info: Adequate Speech Info: Adequate    SPECIAL CARE FACTORS FREQUENCY  PT (By licensed PT), OT (By licensed OT)     PT Frequency: 5X per week OT Frequency: 5x per week            Contractures      Additional Factors Info  Code Status, Allergies Code Status Info: DNR Allergies Info: Codeine,Penicillins           Current Medications (07/12/2019):  This is the current hospital active medication list Current Facility-Administered Medications  Medication Dose Route Frequency Provider Last Rate Last Dose  . acetaminophen (TYLENOL) tablet 325-650 mg  325-650 mg Oral Q6H PRN Ainsley Spinner, PA-C      . acetaminophen (TYLENOL) tablet 650 mg  650 mg Oral Q8H Ainsley Spinner, PA-C   650 mg at 07/12/19 1600  . amiodarone (PACERONE) tablet 100 mg  100 mg Oral Daily Ainsley Spinner, PA-C   100 mg at 07/12/19 1007  . apixaban (ELIQUIS) tablet 2.5 mg  2.5 mg Oral BID Ainsley Spinner, PA-C   2.5 mg at 07/12/19 1007  . calcium carbonate (OS-CAL - dosed in mg of elemental calcium) tablet  1,250 mg  1,250 mg Oral Q breakfast Ainsley Spinner, PA-C   1,250 mg at 07/12/19 1007  . cholecalciferol (VITAMIN D3) tablet 2,000 Units  2,000 Units Oral BID Ainsley Spinner, PA-C   2,000 Units at 07/12/19 1007  . docusate sodium (COLACE) capsule 100 mg  100 mg Oral BID Ainsley Spinner, PA-C   100 mg at 07/12/19 1007  . feeding supplement (ENSURE ENLIVE) (ENSURE ENLIVE) liquid 237 mL  237 mL Oral BID BM Ainsley Spinner, PA-C   237 mL at 07/12/19 1559  . HYDROcodone-acetaminophen (NORCO/VICODIN) 5-325 MG per tablet 1-2 tablet  1-2 tablet Oral Q4H PRN Ainsley Spinner,  PA-C   2 tablet at 07/11/19 1654  . lactated ringers infusion   Intravenous Continuous Modena Jansky, MD 50 mL/hr at 07/12/19 1257    . menthol-cetylpyridinium (CEPACOL) lozenge 3 mg  1 lozenge Oral PRN Ainsley Spinner, PA-C       Or  . phenol (CHLORASEPTIC) mouth spray 1 spray  1 spray Mouth/Throat PRN Ainsley Spinner, PA-C      . metoCLOPramide (REGLAN) tablet 5-10 mg  5-10 mg Oral Q8H PRN Ainsley Spinner, PA-C       Or  . metoCLOPramide (REGLAN) injection 5-10 mg  5-10 mg Intravenous Q8H PRN Ainsley Spinner, PA-C      . morphine 2 MG/ML injection 0.5-1 mg  0.5-1 mg Intravenous Q2H PRN Ainsley Spinner, PA-C      . mupirocin ointment (BACTROBAN) 2 % 1 application  1 application Nasal BID Ainsley Spinner, PA-C   1 application at 71/69/67 1007  . ondansetron (ZOFRAN) tablet 4 mg  4 mg Oral Q6H PRN Ainsley Spinner, PA-C       Or  . ondansetron [facility]) injection 4 mg  4 mg Intravenous Q6H PRN Ainsley Spinner, PA-C      . polyethylene glycol (MIRALAX / GLYCOLAX) packet 17 g  17 g Oral Daily PRN Ainsley Spinner, PA-C   17 g at 07/12/19 1007  . senna-docusate (Senokot-S) tablet 2 tablet  2 tablet Oral QHS PRN Ainsley Spinner, PA-C      . spironolactone (ALDACTONE) tablet 12.5 mg  12.5 mg Oral Audree Camel, PA-C      . vitamin C (ASCORBIC ACID) tablet 500 mg  500 mg Oral Daily Ainsley Spinner, PA-C   500 mg at 07/12/19 1007     Discharge Medications: Please see discharge summary for a list of discharge medications.  Relevant Imaging Results:  Relevant Lab Results:   Additional Information SS# [id number]  [place], [place]

## 2019-07-12 NOTE — Evaluation (Signed)
Physical Therapy Evaluation Patient Details Name: Miranda Ford MRN: [id number] DOB: 1924/11/12 Today's Date: 07/12/2019   History of Present Illness  85 year old with history of bilateral cataract, systolic CHF, epistaxis, paroxysmal atrial fibrillation on amiodarone, pulmonary embolism, osteoporosis came to the ER after having an accidental fall while gardening.  She was found to have left humerus and left hip fracture.  Orthopedic consult for operative management.  s/p left hip IM nail.   Clinical Impression  Patient is s/p above surgery presenting with functional limitations due to the deficits listed below (see PT Problem List). Demonstrates eagerness to work with physical therapy today, Mod assist with bed mobility and transfer to recliner. Good tolerance to LE exercises. SpO2 down to 85% on RA while speaking without physical exertion, no dyspnea or distress, back to upper 90s with 2L supplemental O2 applied. Patient will benefit from skilled PT to increase their independence and safety with mobility to allow discharge to the venue listed below.       Follow Up Recommendations SNF    Equipment Recommendations  None recommended by PT(TBD next venue of care)    Recommendations for Other Services       Precautions / Restrictions Precautions Precautions: Fall Restrictions Weight Bearing Restrictions: Yes LUE Weight Bearing: Non weight bearing LLE Weight Bearing: Weight bearing as tolerated      Mobility  Bed Mobility Overal bed mobility: Needs Assistance Bed Mobility: Supine to Sit     Supine to sit: HOB elevated;Mod assist     General bed mobility comments: Mod assist for LLE and trunk support to rise to EOB, increased time, cues for technique with use of rail for RUE and HOB elevated.  Transfers Overall transfer level: Needs assistance Equipment used: Rolling walker (2 wheeled);1 person hand held assist Transfers: Sit to/from [facility] Sit to Stand:  Mod assist Stand pivot transfers: Mod assist       General transfer comment: Mod assist for boost to stand from bed. RUE onto RW once upright and able to perform pre-gait activities including weight shift, heel lifts, and marching as tolerated. Mod assist with single RUE support and Lt knee block for safety during Rt side pivot to recliner. Rt knee did not buckle but was unstable in full WB.  Ambulation/Gait                Stairs            Wheelchair Mobility    Modified Rankin (Stroke Patients Only)       Balance Overall balance assessment: Needs assistance Sitting-balance support: Single extremity supported;Feet supported Sitting balance-Leahy Scale: Fair Sitting balance - Comments: required support of single UE    Standing balance support: Single extremity supported Standing balance-Leahy Scale: Poor Standing balance comment: modA to maintain standing balance and side step alongside bed;                             Pertinent Vitals/Pain Pain Assessment: Faces Faces Pain Scale: Hurts a little bit Pain Location: Lt shoulder and hip    Home Living Family/patient expects to be discharged to:: Private residence Living Arrangements: Alone Available Help at Discharge: Family;Available PRN/intermittently Type of Home: House Home Access: Ramped entrance     Home Layout: One level Home Equipment: Shower seat;Cane - single point;Bedside commode;Walker - 2 wheels      Prior Function Level of Independence: Needs assistance   Gait / Transfers Assistance Needed:  intermittent use of cane for community mobility  ADL's / Homemaking Assistance Needed: pt required assistance from daughter for IADL        Hand Dominance   Dominant Hand: Right    Extremity/Trunk Assessment   Upper Extremity Assessment Upper Extremity Assessment: Defer to OT evaluation LUE Deficits / Details: NWB;in sling;unable to fully assess due to pain;digit ROM WNL;wrist ROM  WNL; LUE Sensation: WNL LUE Coordination: WNL    Lower Extremity Assessment Lower Extremity Assessment: LLE deficits/detail LLE Deficits / Details: WBAT; proximal weakness as expected post op    Cervical / Trunk Assessment Cervical / Trunk Assessment: Normal  Communication   Communication: No difficulties  Cognition Arousal/Alertness: Awake/alert Behavior During Therapy: WFL for tasks assessed/performed Overall Cognitive Status: Within Functional Limits for tasks assessed                                        General Comments General comments (skin integrity, edema, etc.): SpO2 on room air decreased to 85% while talking with PT prior to physical activity. 2L O2 added returned quicly to upper 90s. RN notified.    Exercises General Exercises - Lower Extremity Ankle Circles/Pumps: AROM;Both;15 reps;Supine Quad Sets: Strengthening;Both;10 reps;Supine Gluteal Sets: Strengthening;Both;10 reps;Supine Long Arc Quad: AAROM;Left;10 reps;Seated Heel Slides: AAROM;Left;5 reps;Supine Hip ABduction/ADduction: AAROM;Both;10 reps;Supine Hip Flexion/Marching: AAROM;Left;10 reps;Seated Heel Raises: Strengthening;Both;Standing;5 reps   Assessment/Plan    PT Assessment Patient needs continued PT services  PT Problem List Decreased strength;Decreased range of motion;Decreased activity tolerance;Decreased balance;Decreased mobility;Decreased knowledge of use of DME;Decreased knowledge of precautions;Pain       PT Treatment Interventions DME instruction;Gait training;Functional mobility training;Therapeutic activities;Therapeutic exercise;Balance training;Neuromuscular re-education;Modalities    PT Goals (Current goals can be found in the Care Plan section)  Acute Rehab PT Goals Patient Stated Goal: to get stronger, prefers rehab at [facility] PT Goal Formulation: With patient Time For Goal Achievement: 07/26/19 Potential to Achieve Goals: Good    Frequency Min 3X/week    Barriers to discharge Decreased caregiver support lives alone    Co-evaluation               AM-PAC PT "6 Clicks" Mobility  Outcome Measure Help needed turning from your back to your side while in a flat bed without using bedrails?: A Little Help needed moving from lying on your back to sitting on the side of a flat bed without using bedrails?: A Lot Help needed moving to and from a bed to a chair (including a wheelchair)?: A Lot Help needed standing up from a chair using your arms (e.g., wheelchair or bedside chair)?: A Lot Help needed to walk in hospital room?: Total Help needed climbing 3-5 steps with a railing? : Total 6 Click Score: 11    End of Session Equipment Utilized During Treatment: Gait belt;Oxygen Activity Tolerance: Patient tolerated treatment well Patient left: in chair;with call bell/phone within reach;with nursing/sitter in room Nurse Communication: Mobility status(O2 sats) PT Visit Diagnosis: Unsteadiness on feet (R26.81);Other abnormalities of gait and mobility (R26.89);Muscle weakness (generalized) (M62.81);History of falling (Z91.81);Difficulty in walking, not elsewhere classified (R26.2);Pain Pain - Right/Left: Left Pain - part of body: Shoulder;Hip    Time: 5361-4431 PT Time Calculation (min) (ACUTE ONLY): 28 min   Charges:   PT Evaluation $PT Eval Moderate Complexity: 1 Mod PT Treatments $Therapeutic Exercise: 8-22 mins        Elayne Snare, PT, DPT  Ellouise Newer 07/12/2019, 5:09 PM

## 2019-07-12 NOTE — Progress Notes (Signed)
Subjective: Patient reports pain as mild to moderate, controlled.  Tolerating diet. No CP, SOB.  Not yet mobilized.  Previously ambulatory without aid in her home.  Walker when out of the house.  Objective:   VITALS:   Vitals:   07/11/19 1700 07/11/19 2059 07/12/19 0415 07/12/19 0821  BP:  (!) 95/57 116/67 (!) 111/53  Pulse:  72 63 72  Resp:    14  Temp:  97.8 F (36.6 C) 97.9 F (36.6 C) 98 F (36.7 C)  TempSrc:  Oral Oral Oral  SpO2: 93% 97% 97% 93%  Weight:      Height:       CBC Latest Ref Rng & Units 07/12/2019 07/10/2019 04/11/2018  WBC 4.0 - 10.5 K/uL 11.9(H) 9.0 -  Hemoglobin 12.0 - 15.0 g/dL 9.9(L) 12.8 14.6  Hematocrit 36.0 - 46.0 % 31.4(L) 39.7 43.0  Platelets 150 - 400 K/uL 127(L) 166 -   BMP Latest Ref Rng & Units 07/12/2019 07/10/2019 06/05/2018  Glucose 70 - 99 mg/dL 128(H) 144(H) 125(H)  BUN 8 - 23 mg/dL 27(H) 29(H) 20  Creatinine 0.44 - 1.00 mg/dL 1.32(H) 1.50(H) 1.13(H)  BUN/Creat Ratio 12 - 28 - - -  Sodium 135 - 145 mmol/L 134(L) 138 138  Potassium 3.5 - 5.1 mmol/L 4.6 4.5 4.6  Chloride 98 - 111 mmol/L 100 102 99(L)  CO2 22 - 32 mmol/L 26 30 32  Calcium 8.9 - 10.3 mg/dL 8.2(L) 8.7(L) 9.3   Intake/Output      07/17 0701 - 07/18 0700 07/18 0701 - 07/19 0700   P.O. 240 240   I.V. (mL/kg) 848.2 (15)    IV Piggyback 100    Total Intake(mL/kg) 1188.2 (21) 240 (4.2)   Urine (mL/kg/hr) 0 (0)    Stool  200   Blood 100    Total Output 100 200   Net +1088.2 +40        Urine Occurrence 2 x       Physical Exam: General: NAD.  Upright in bed.  Calm, conversant. MSK LUE:  Sling and ice in place.  Mild tenderness upper arm in the area of fracture.  Fully neurovascularly intact distally. LLE: Neurovascularly intact Sensation intact distally Feet warm Dorsiflexion/Plantar flexion intact Incision: Proximal dressing with mild bloody drainage.  Changed.   Assessment / Plan: 1 Day Post-Op  S/P Procedure(s) (LRB): INTRAMEDULLARY (IM) NAIL  INTERTROCHANTRIC (Left) by Dr. Marcelino Scot on 07/11/2019  Principal Problem:   Closed left hip fracture, initial encounter [facility]) Active Problems:   Coronary atherosclerosis of native coronary artery   Paroxysmal atrial tachycardia (HCC)   Senile osteoporosis   Chronic systolic CHF (congestive heart failure) ([place])   History of pulmonary embolism   Bradycardia   Closed fracture of left proximal humerus   Closed left intertrochanteric femur fracture, status post IM nail Doing well postop day 1 Pain controlled Not yet mobilized   Closed left distal humerus fracture Nonoperative management NWB LUE.  No lifting left arm Sling for comfort Initiate early shoulder pendulum exercises and passive movement as pain allows No resistance exercise for 6 weeks. Likely start active assisted motion in ~4 weeks  Incentive spirometry Ice PRN  Weightbearing: WBAT LLE.  NWB LUE. Insicional and dressing care: Mepilex PRN Orthopedic device(s): Sling for comfort LUE Showering: Keep dressing dry VTE prophylaxis: Eliquis resumed, SCDs, ambulation Pain control: Minimize narcotics.  Tylenol.  Norco for breakthrough pain. Follow - up plan: Dr. Marcelino Scot in 1 to 2 weeks  Dispo: Therapy  evaluations pending.  Discharge when mobilized, and ready medically.    Miranda Burly III, PA-C 07/12/2019, 9:39 AM

## 2019-07-12 NOTE — TOC Initial Note (Signed)
Transition of Care [facility]) - Initial/Assessment Note    Patient Details  Name: Miranda Ford MRN: [id number] Date of Birth: 30-Apr-1924  [facility] CM/SW Contact:    Bary Castilla, LCSW Phone Number: [phone] 07/12/2019, 4:10 PM  Clinical Narrative:   CSW met with patient bedside to discuss discharge planning and OT recommendation for a SNF. Patient acknowledged that she was aware of recommendation and is agreeable to SNF. Patient stated that she would prefer to go to the [facility] of SNFs closer to her home. CSW received permission to send out referrals to SNFs.  Patient informed CSW that her POA was her niece Jeannene Patella. CSW reached out to [facility] and discussed the recommendation and what had been discussed with patient. [name] stated that she is in agreement with patient recommendations and is leaving the SNF placement up to client. [name] stated that she would like to be kept informed about discharge planning.  CSW will continue to follow patient for discharge planning.                 Expected Discharge Plan: Skilled Nursing Facility Barriers to Discharge: Continued Medical Work up, SNF Pending bed offer   Patient Goals and CMS Choice Patient states their goals for this hospitalization and ongoing recovery are:: To get better CMS Medicare.gov Compare Post Acute Care list provided to:: Patient Choice offered to / list presented to : Patient  Expected Discharge Plan and Services Expected Discharge Plan: [place]                                              Prior Living Arrangements/Services   Lives with:: Self Patient language and need for interpreter reviewed:: Yes                 Activities of Daily Living Home Assistive Devices/Equipment: None ADL Screening (condition at time of admission) Patient's cognitive ability adequate to safely complete daily activities?: Yes Is the patient deaf or have difficulty hearing?: No Does the  patient have difficulty seeing, even when wearing glasses/contacts?: No Does the patient have difficulty concentrating, remembering, or making decisions?: No Patient able to express need for assistance with ADLs?: No Does the patient have difficulty dressing or bathing?: No Independently performs ADLs?: Yes (appropriate for developmental age) Does the patient have difficulty walking or climbing stairs?: No Weakness of Legs: None Weakness of Arms/Hands: None  Permission Sought/Granted Permission sought to share information with : Facility Sport and exercise psychologist, Family Supports Permission granted to share information with : Yes, Verbal Permission Granted  Share Information with NAME: [name]  Permission granted to share info w AGENCY: SNFs  Permission granted to share info w Relationship: Niece  Permission granted to share info w Contact Information: [phone]  Emotional Assessment Appearance:: Appears stated age Attitude/Demeanor/Rapport: Engaged Affect (typically observed): Accepting, Adaptable Orientation: : Oriented to Self, Oriented to Place, Oriented to  Time, Oriented to Situation      Admission diagnosis:  Fall [W19.XXXA] Anticoagulated by anticoagulation treatment [Z79.01] Closed fracture of left hip, initial encounter ([place]) [S72.002A] Closed fracture of proximal end of left humerus, unspecified fracture morphology, initial encounter [S42.202A] Patient Active Problem List   Diagnosis Date Noted  . Closed fracture of left proximal humerus 07/11/2019  . Closed left hip fracture, initial encounter ([place]) 07/10/2019  . Chronic systolic CHF (  congestive heart failure) ([place])   . History of pulmonary embolism   . Bradycardia   . Senile osteoporosis 07/18/2016  . DNR (do not resuscitate) discussion   . Palliative care encounter   . Epistaxis 07/07/2016  . Acute systolic CHF (congestive heart failure) ([place]) 07/05/2016  . Acute respiratory failure with hypoxia ([place]) 07/05/2016  .  Secondary cardiomyopathy ([place])   . Biventricular failure ([place])   . Atrial tachycardia ([place])   . Paroxysmal atrial tachycardia ([place])   . Demand ischemia ([place])   . Acute pulmonary embolism ([place]) 07/03/2016  . COPD exacerbation ([place]) 07/03/2016  . Cardiomegaly 07/03/2016  . Coronary atherosclerosis of native coronary artery 07/03/2016  . Elevated troponin    PCP:  Sharilyn Sites, MD Pharmacy:   [facility] [place] [address] [place] [address] Phone: [phone] Fax: [phone]     Social Determinants of Health (SDOH) Interventions    Readmission Risk Interventions No flowsheet data found.

## 2019-07-12 NOTE — Progress Notes (Signed)
Occupational Therapy Evaluation Patient Details Name: Miranda Ford MRN: [id number] DOB: 09/20/1924 Today's Date: 07/12/2019    History of Present Illness 85 year old with history of bilateral cataract, systolic CHF, epistaxis, paroxysmal atrial fibrillation on amiodarone, pulmonary embolism, osteoporosis came to the ER after having an accidental fall while gardening.  She was found to have left humerus and left hip fracture.  Orthopedic consult for operative management.  s/p left IM nail.    Clinical Impression   PTA, pt was living at home alone, and reports she was independent with ADL/IADL and modified independent with functional mobility with intermittent use of spc. Pt currently requires maxA to progress to EOB in preparation for ADL, requires minA for grooming, and total A for LB ADL. Pt requires maxA for sit<>stand transfer and was unable to tolerate steps this date so transfer was deferred. Due to decline in current level of function, pt would benefit from acute OT to address established goals to facilitate safe D/C to venue listed below. At this time, recommend SNF follow-up. Will continue to follow acutely.     Follow Up Recommendations  SNF    Equipment Recommendations  3 in 1 bedside commode    Recommendations for Other Services PT consult     Precautions / Restrictions Precautions Precautions: Fall Restrictions Weight Bearing Restrictions: Yes LUE Weight Bearing: Non weight bearing LLE Weight Bearing: Weight bearing as tolerated      Mobility Bed Mobility Overal bed mobility: Needs Assistance Bed Mobility: Sit to Supine;Supine to Sit     Supine to sit: Max assist;HOB elevated Sit to supine: Max assist;HOB elevated   General bed mobility comments: maxA for BLE management and progress trunk to upright position;  Transfers Overall transfer level: Needs assistance Equipment used: Rolling walker (2 wheeled) Transfers: Sit to/from Stand Sit to Stand: Max assist         General transfer comment: maxA to powerup and for stability in standing;pt able to side shuffle to the [place] L to reposition higher alongside bed    Balance Overall balance assessment: Needs assistance Sitting-balance support: Single extremity supported;Feet supported Sitting balance-Leahy Scale: Fair Sitting balance - Comments: required support of single UE    Standing balance support: Single extremity supported Standing balance-Leahy Scale: Poor Standing balance comment: modA to maintain standing balance and side step alongside bed;                           ADL either performed or assessed with clinical judgement   ADL Overall ADL's : Needs assistance/impaired Eating/Feeding: Set up;Sitting   Grooming: Minimal assistance;Sitting Grooming Details (indicate cue type and reason): pt required minA for opening oral care items Upper Body Bathing: Min guard;Sitting   Lower Body Bathing: Maximal assistance;Sit to/from stand   Upper Body Dressing : Moderate assistance Upper Body Dressing Details (indicate cue type and reason): modA to don/doff sling Lower Body Dressing: Maximal assistance;Sit to/from stand                 General ADL Comments: deferred functional mobility and transfers due to safety;pt with posterior lean in standing     Vision Patient Visual Report: No change from baseline       Perception     Praxis      Pertinent Vitals/Pain Pain Assessment: No/denies pain     Hand Dominance Right   Extremity/Trunk Assessment Upper Extremity Assessment Upper Extremity Assessment: Generalized weakness;LUE deficits/detail LUE Deficits / Details: NWB;in sling;unable  to fully assess due to pain;digit ROM WNL;wrist ROM WNL; LUE Sensation: WNL LUE Coordination: WNL   Lower Extremity Assessment Lower Extremity Assessment: Defer to PT evaluation;LLE deficits/detail LLE Deficits / Details: WBAT;pt unable to lift foot off ground, able to tolerate  shuffle R&L   Cervical / Trunk Assessment Cervical / Trunk Assessment: Normal   Communication Communication Communication: No difficulties   Cognition Arousal/Alertness: Awake/alert Behavior During Therapy: WFL for tasks assessed/performed Overall Cognitive Status: Within Functional Limits for tasks assessed                                     General Comments  pt with good adherence to sling and weight bearing precautions;pt discussed she understands having additional therpy following d/c    Upper Extremity Assessment   Lower Extremity Assessment Exercises LUE: NWB;in sling;unable to fully assess due to pain;digit ROM WNL;wrist ROM WNL;  ZOX:WRUE;AV unable to lift foot off ground, able to tolerate shuffle R&L  Shoulder Instructions      Home Living Family/patient expects to be discharged to:: Private residence Living Arrangements: Alone Available Help at Discharge: Family;Available PRN/intermittently Type of Home: House Home Access: Ramped entrance     Home Layout: One level     Bathroom Shower/Tub: Occupational psychologist: Standard     Home Equipment: Shower seat;Cane - single point;Bedside commode;Walker - 2 wheels          Prior Functioning/Environment Level of Independence: Needs assistance  Gait / Transfers Assistance Needed: intermittent use of cane for community mobility ADL's / Homemaking Assistance Needed: pt required assistance from daughter for IADL            OT Problem List: Decreased strength;Decreased range of motion;Decreased activity tolerance;Impaired balance (sitting and/or standing);Decreased safety awareness;Decreased knowledge of use of DME or AE;Decreased knowledge of precautions;Impaired UE functional use;Pain      OT Treatment/Interventions: Self-care/ADL training;Therapeutic exercise;DME and/or AE instruction;Therapeutic activities;Patient/family education;Balance training    OT Goals(Current goals can be  found in the care plan section) Acute Rehab OT Goals Patient Stated Goal: to get stronger OT Goal Formulation: With patient Time For Goal Achievement: 07/26/19 Potential to Achieve Goals: Good ADL Goals Pt Will Perform Grooming: with modified independence;standing;sitting Pt Will Perform Upper Body Dressing: with modified independence;sitting;standing Pt Will Perform Lower Body Dressing: with modified independence;sit to/from stand Pt Will Transfer to Toilet: with modified independence;ambulating Pt Will Perform Tub/Shower Transfer: with modified independence;ambulating Additional ADL Goal #1: Pt will independently verbalize 3 fall prevention strategies.  OT Frequency: Min 3X/week   Barriers to D/C: Decreased caregiver support  pt lives alone       Co-evaluation              AM-PAC OT "6 Clicks" Daily Activity     Outcome Measure Help from another person eating meals?: A Little Help from another person taking care of personal grooming?: A Little Help from another person toileting, which includes using toliet, bedpan, or urinal?: Total Help from another person bathing (including washing, rinsing, drying)?: A Lot Help from another person to put on and taking off regular upper body clothing?: A Lot Help from another person to put on and taking off regular lower body clothing?: A Lot 6 Click Score: 13   End of Session Equipment Utilized During Treatment: Gait belt;Rolling walker(sling) Nurse Communication: Mobility status  Activity Tolerance: Patient tolerated treatment well;Patient limited by pain Patient  left: in bed;with call bell/phone within reach;with bed alarm set  OT Visit Diagnosis: Unsteadiness on feet (R26.81);Other abnormalities of gait and mobility (R26.89);Muscle weakness (generalized) (M62.81);History of falling (Z91.81);Pain Pain - Right/Left: Left Pain - part of body: Arm;Leg                Time: 9774-1423 OT Time Calculation (min): 48 min Charges:  OT  General Charges $OT Visit: 1 Visit OT Evaluation $OT Eval Moderate Complexity: 1 Mod OT Treatments $Self Care/Home Management : 23-37 mins  Dorinda Hill OTR/L Acute Rehabilitation Services Office: [phone]   Wyn Forster 07/12/2019, 12:05 PM

## 2019-07-13 LAB — CBC
HCT: 26.4 % — ABNORMAL LOW (ref 36.0–46.0)
Hemoglobin: 8.5 g/dL — ABNORMAL LOW (ref 12.0–15.0)
MCH: 32.3 pg (ref 26.0–34.0)
MCHC: 32.2 g/dL (ref 30.0–36.0)
MCV: 100.4 fL — ABNORMAL HIGH (ref 80.0–100.0)
Platelets: 120 10*3/uL — ABNORMAL LOW (ref 150–400)
RBC: 2.63 MIL/uL — ABNORMAL LOW (ref 3.87–5.11)
RDW: 14.3 % (ref 11.5–15.5)
WBC: 11.1 10*3/uL — ABNORMAL HIGH (ref 4.0–10.5)
nRBC: 0 % (ref 0.0–0.2)

## 2019-07-13 LAB — BASIC METABOLIC PANEL
Anion gap: 10 (ref 5–15)
BUN: 23 mg/dL (ref 8–23)
CO2: 25 mmol/L (ref 22–32)
Calcium: 8.1 mg/dL — ABNORMAL LOW (ref 8.9–10.3)
Chloride: 101 mmol/L (ref 98–111)
Creatinine, Ser: 1.09 mg/dL — ABNORMAL HIGH (ref 0.44–1.00)
GFR calc Af Amer: 51 mL/min — ABNORMAL LOW (ref >60)
GFR calc non Af Amer: 44 mL/min — ABNORMAL LOW (ref >60)
Glucose, Bld: 110 mg/dL — ABNORMAL HIGH (ref 70–99)
Potassium: 4.2 mmol/L (ref 3.5–5.1)
Sodium: 136 mmol/L (ref 135–145)

## 2019-07-13 MED ORDER — BISACODYL 10 MG RE SUPP
10.0000 mg | Freq: Every day | RECTAL | Status: DC | PRN
  Administered 2019-07-13: 09:00:00 10 mg via RECTAL
  Filled 2019-07-13: qty 1

## 2019-07-13 MED ORDER — BISACODYL 10 MG RE SUPP: 10.0000 mg | Freq: Once | RECTAL | Status: AC

## 2019-07-13 MED ORDER — SENNOSIDES-DOCUSATE SODIUM 8.6-50 MG PO TABS
2.0000 | ORAL_TABLET | Freq: Every day | ORAL | Status: DC
  Administered 2019-07-13 – 2019-07-14 (×2): 2 via ORAL
  Filled 2019-07-13 (×2): qty 2

## 2019-07-13 MED ORDER — POLYETHYLENE GLYCOL 3350 17 G PO PACK: 17.0000 g | PACK | Freq: Two times a day (BID) | ORAL | Status: DC | PRN

## 2019-07-13 MED ORDER — POLYETHYLENE GLYCOL 3350 17 G PO PACK
17.0000 g | PACK | Freq: Two times a day (BID) | ORAL | Status: DC
  Administered 2019-07-13 (×2): 17 g via ORAL
  Filled 2019-07-13 (×4): qty 1

## 2019-07-13 NOTE — Progress Notes (Addendum)
PROGRESS NOTE   Miranda Ford  UMP:[id number]    DOB: 1924-05-09    DOA: 07/10/2019  PCP: Sharilyn Sites, MD   I have briefly reviewed patients previous medical records in [facility].  Chief Complaint  Patient presents with   Fall    Brief Narrative:  85 year old female, lives alone, fairly independent, uses a cane when she goes out of her house, PMH of NICM, chronic systolic CHF, PAT, pulmonary embolism on long-term apixaban per PCP presented to the ED on 07/10/2019 after a mechanical fall to her left side while she was gardening without her cane.  She was admitted for closed left intertrochanteric femur fracture and closed left distal humerus fracture.  S/p IM nail/ORIF of left hip fracture on 7/17.  Left humerus fracture for nonoperative management.  Gradually progressing.   Assessment & Plan:   Principal Problem:   Closed left hip fracture, initial encounter [facility]) Active Problems:   Paroxysmal atrial tachycardia (HCC)   Senile osteoporosis   Chronic systolic CHF (congestive heart failure) (HCC)   History of pulmonary embolism   Closed fracture of left proximal humerus   Closed left intertrochanteric femur fracture  Sustained status post mechanical fall in the context of underlying osteoporosis.  Orthopedics consulted and patient underwent ORIF/IM nail on 7/17  As per orthopedic follow-up: WBAT LLE, Mepilex dressing PRN, keep dressing dry, Eliquis resumed and continue mobilization.  Was out of bed to chair for a couple of hours yesterday.  PT recommends SNF.  Pain control/minimize narcotic use.  Outpatient follow-up with Dr. Marcelino Scot in 1 to 2 weeks.  Discussed with orthopedics team in person this morning.  Acute left humerus fracture  As per orthopedic follow-up, nonoperative management.  NWB LUE.  No lifting of left arm.  Sling for comfort.  Initiate early shoulder pendulum exercises and passive movement as pain allows.  No resistance exercise for 6 weeks.   Likely start active assisted motion in 4 weeks.  Postop acute blood loss anemia  Secondary to fracture and recent surgery.  Hemoglobin dropped from 12.8 preop to 9.9 yesterday and 8.5 today.  Asymptomatic.  Continue to trend daily CBCs and transfuse if hemoglobin 7 or less.  Thrombocytopenia, mild  Likely related to acute blood loss.  No overt bleeding.  Relatively stable.  Continue to trend daily CBCs  Acute kidney injury complicating stage III chronic kidney disease  Baseline creatinine in the 1.1 range.  Creatinine increased to 1.5 on 7/16.  Resolved after brief and gentle IV fluids.  NICM/chronic systolic CHF  Follows with Dr. Domenic Polite, Cardiology  Clinically euvolemic.  On spironolactone 12.5 mg every other day.  Paroxysmal atrial tachycardia  Currently in sinus rhythm.  Continue low-dose amiodarone.  Although OP Cardiology follow-up from December 2019 indicates patient on low-dose Carvedilol, she does not appear to be on it.  History of pulmonary embolus  Remains on long-term Eliquis as per her PCP.  COPD  Noted on chest x-ray.  Still on 3 L/min oxygen via nasal cannula and saturating in the mid 90s.  Discussed with RN to wean off oxygen as tolerated.  Aggressive incentive spirometry use.  Osteoporosis  Continue calcium supplements.  Consider bisphosphonates.  Outpatient bone bone density check with PCP.  Constipation  Multifactorial due to immobilization, poor oral intake, opioids.  Aggressive bowel regimen and monitor.   DVT prophylaxis: Eliquis Code Status: DNR Family Communication: Discussed in detail with patient's niece/HCPOA on 7/19, updated care and answered questions. Disposition: To be determined pending  clinical improvement and therapies evaluation.  Should be medically stable for discharge to SNF on 7/20 pending bed availability and insurance approval.   Consultants:  Orthopedics  Procedures:  IM nail for left hip fracture on 7/17 Left  shoulder sling for humerus fracture.  Antimicrobials:  None.   Subjective: Reports left lower extremity pain more with movement and controlled with Tylenol.  As per advice, does not move proximal left upper extremity much.  No BM since 4 to 5 days, feels full but no abdominal pain, nausea or vomiting.  Passing flatus.  Wants to have a BM.  Indicates that she was up in chair for about 2 hours yesterday.  Objective:  Vitals:   07/12/19 1457 07/12/19 2053 07/13/19 0452 07/13/19 0901  BP: 115/65 (!) 107/59 (!) 109/53 (!) 105/50  Pulse: 66 79 85 88  Resp: 14   14  Temp:  97.7 F (36.5 C) 99 F (37.2 C) 98.5 F (36.9 C)  TempSrc:  Oral Axillary Oral  SpO2: 94% 98% 95% 95%  Weight:      Height:        Examination:  General exam: Pleasant elderly female, moderately built and frail lying comfortably propped up in bed without distress. Respiratory system: Clear to auscultation.  No increased work of breathing. Cardiovascular system: S1 and S2 heard, RRR.  No JVD, murmurs or pedal edema. Gastrointestinal system: Abdomen is nondistended, soft and nontender.  No organomegaly or masses appreciated.  Normal bowel sounds heard. Central nervous system: Alert and oriented. No focal neurological deficits. Extremities: Right limbs grade 5 x 5 power.  Left upper extremity in sling but normal movements and strength of left fingers and wrist.  Left lower extremity power assessment limited secondary to postop pain.  Top of the 3 procedure site dressing minimally soiled with dried blood.  No significant change in exam today. Skin: No rashes, lesions or ulcers Psychiatry: Judgement and insight appear normal. Mood & affect appropriate.     Data Reviewed: I have personally reviewed following labs and imaging studies  CBC: Recent Labs  Lab 07/10/19 2112 07/12/19 0411 07/13/19 0904  WBC 9.0 11.9* 11.1*  NEUTROABS 7.0  --   --   HGB 12.8 9.9* 8.5*  HCT 39.7 31.4* 26.4*  MCV 99.3 100.0 100.4*    PLT 166 127* 332*   Basic Metabolic Panel: Recent Labs  Lab 07/10/19 2112 07/12/19 0728 07/13/19 0356  NA 138 134* 136  K 4.5 4.6 4.2  CL 102 100 101  CO2 30 26 25   GLUCOSE 144* 128* 110*  BUN 29* 27* 23  CREATININE 1.50* 1.32* 1.09*  CALCIUM 8.7* 8.2* 8.1*   Liver Function Tests: No results for input(s): AST, ALT, ALKPHOS, BILITOT, PROT, ALBUMIN in the last 168 hours. Coagulation Profile: Recent Labs  Lab 07/10/19 2112  INR 1.0     Recent Results (from the past 240 hour(s))  SARS Coronavirus 2 (CEPHEID - Performed in [facility] hospital lab), Hosp Order     Status: None   Collection Time: 07/10/19 10:11 PM   Specimen: Nasopharyngeal Swab  Result Value Ref Range Status   SARS Coronavirus 2 NEGATIVE NEGATIVE Final    Comment: (NOTE) If result is NEGATIVE SARS-CoV-2 target nucleic acids are NOT DETECTED. The SARS-CoV-2 RNA is generally detectable in upper and lower  respiratory specimens during the acute phase of infection. The lowest  concentration of SARS-CoV-2 viral copies this assay can detect is 250  copies / mL. A negative result does not  preclude SARS-CoV-2 infection  and should not be used as the sole basis for treatment or other  patient management decisions.  A negative result may occur with  improper specimen collection / handling, submission of specimen other  than nasopharyngeal swab, presence of viral mutation(s) within the  areas targeted by this assay, and inadequate number of viral copies  (<250 copies / mL). A negative result must be combined with clinical  observations, patient history, and epidemiological information. If result is POSITIVE SARS-CoV-2 target nucleic acids are DETECTED. The SARS-CoV-2 RNA is generally detectable in upper and lower  respiratory specimens dur ing the acute phase of infection.  Positive  results are indicative of active infection with SARS-CoV-2.  Clinical  correlation with patient history and other diagnostic  information is  necessary to determine patient infection status.  Positive results do  not rule out bacterial infection or co-infection with other viruses. If result is PRESUMPTIVE POSTIVE SARS-CoV-2 nucleic acids MAY BE PRESENT.   A presumptive positive result was obtained on the submitted specimen  and confirmed on repeat testing.  While 2019 novel coronavirus  (SARS-CoV-2) nucleic acids may be present in the submitted sample  additional confirmatory testing may be necessary for epidemiological  and / or clinical management purposes  to differentiate between  SARS-CoV-2 and other Sarbecovirus currently known to infect humans.  If clinically indicated additional testing with an alternate test  methodology [phone]) is advised. The SARS-CoV-2 RNA is generally  detectable in upper and lower respiratory sp ecimens during the acute  phase of infection. The expected result is Negative. Fact Sheet for Patients:  StrictlyIdeas.no Fact Sheet for Healthcare Providers: BankingDealers.co.za This test is not yet approved or cleared by the [place] FDA and has been authorized for detection and/or diagnosis of SARS-CoV-2 by FDA under an Emergency Use Authorization (EUA).  This EUA will remain in effect (meaning this test can be used) for the duration of the COVID-19 declaration under Section 564(b)(1) of the Act, 21 U.S.C. section 360bbb-3(b)(1), unless the authorization is terminated or revoked sooner. Performed at [facility], [address] [place], [place] [address]   Surgical PCR screen     Status: None   Collection Time: 07/11/19  5:45 AM   Specimen: Nasal Mucosa; Nasal Swab  Result Value Ref Range Status   MRSA, PCR NEGATIVE NEGATIVE Final   Staphylococcus aureus NEGATIVE NEGATIVE Final    Comment: (NOTE) The Xpert SA Assay (FDA approved for NASAL specimens in patients 93 years of age and older), is one component of a  comprehensive surveillance program. It is not intended to diagnose infection nor to guide or monitor treatment. Performed at [facility], [place] [address] [place], [place] [address]          Radiology Studies: Pelvis Portable  Result Date: 07/11/2019 CLINICAL DATA:  Postoperative left hip ORIF. EXAM: PORTABLE PELVIS 1-2 VIEWS COMPARISON:  Same day operative fluoroscopy FINDINGS: Postsurgical changes from a left femoral ORIF with intramedullary nail and transcervical fixation rod. Overlying soft tissue swelling and gas are expected postoperatively. Portions of the ischial tuberosities collimated. Remaining visualized bones of the pelvis appear congruent although the sacrum is poorly evaluated due to overlying bowel gas of the visual sacral arcs are intact. Discogenic changes are present in the lower lumbar spine most pronounced at L4-5 L5-S1. Mild degenerative changes in both hips. Cholecystectomy clips in the right upper quadrant. Bowel gas pattern is normal. IMPRESSION: Expected postoperative appearance following left femoral open reduction internal  fixation. Electronically Signed   By: Lovena Le M.D.   On: 07/11/2019 16:22   Dg Shoulder Left  Result Date: 07/11/2019 CLINICAL DATA:  Fracture of the left humerus seen 1 day prior EXAM: LEFT SHOULDER - 2+ VIEW COMPARISON:  Left shoulder radiograph 07/10/2019 FINDINGS: Redemonstration of a minimally comminuted fracture through the surgical neck of the left humerus with involvement of the greater and lesser tuberosities. There is foreshortening and slight anterior displacement across the fracture line. Anteroinferior displacement of the humeral head relative to the glenoid is new from prior exam. IMPRESSION: Redemonstration of a comminuted, foreshortened fracture through the surgical neck left humerus Inferior displacement of the humeral head likely reflects left shoulder effusion/hemarthrosis. Electronically Signed   By: Lovena Le M.D.    On: 07/11/2019 16:27   Dg C-arm 1-60 Min  Result Date: 07/11/2019 CLINICAL DATA:  The patient suffered a left intertrochanteric fracture in a fall 07/10/2019. Intraoperative imaging fixation. Initial encounter. EXAM: LEFT FEMUR 2 VIEWS; DG C-ARM 61-120 MIN COMPARISON:  Plain films left hip 07/10/2019. FINDINGS: Six fluoroscopic spot views of the left hip demonstrate placement of a hip screw and long intramedullary nail with a single distal screw for fixation of an intertrochanteric fracture. Hardware is intact. Position and alignment anatomic. No acute finding. IMPRESSION: Intraoperative imaging for fixation of a left intertrochanteric fracture. No acute finding. Electronically Signed   By: Inge Rise M.D.   On: 07/11/2019 13:04   Dg Femur Min 2 Views Left  Result Date: 07/11/2019 CLINICAL DATA:  The patient suffered a left intertrochanteric fracture in a fall 07/10/2019. Intraoperative imaging fixation. Initial encounter. EXAM: LEFT FEMUR 2 VIEWS; DG C-ARM 61-120 MIN COMPARISON:  Plain films left hip 07/10/2019. FINDINGS: Six fluoroscopic spot views of the left hip demonstrate placement of a hip screw and long intramedullary nail with a single distal screw for fixation of an intertrochanteric fracture. Hardware is intact. Position and alignment anatomic. No acute finding. IMPRESSION: Intraoperative imaging for fixation of a left intertrochanteric fracture. No acute finding. Electronically Signed   By: Inge Rise M.D.   On: 07/11/2019 13:04   Dg Femur Port Min 2 Views Left  Result Date: 07/11/2019 CLINICAL DATA:  Postoperative radiograph left hip open reduction internal fixation. EXAM: LEFT FEMUR PORTABLE 2 VIEWS COMPARISON:  Radiograph 07/10/2019, same day operative fluoroscopy. FINDINGS: Postsurgical changes from a left femoral open reduction internal fixation transfixing the intertrochanteric left femur fracture identified 1 day prior. Hardware appears intact without acute postsurgical  complication. Included portions of the pelvis are unremarkable. There is soft tissue gas along the lateral thigh, expected postoperatively. Catheter tubing projects over the pelvis. Vascular calcium noted in the medial thighs. IMPRESSION: Expected postoperative appearance following left femoral open reduction internal fixation of an intertrochanteric femur fracture. Electronically Signed   By: Lovena Le M.D.   On: 07/11/2019 16:24        Scheduled Meds:  amiodarone  100 mg Oral Daily   apixaban  2.5 mg Oral BID   calcium carbonate  1,250 mg Oral Q breakfast   cholecalciferol  2,000 Units Oral BID   docusate sodium  100 mg Oral BID   feeding supplement (ENSURE ENLIVE)  237 mL Oral BID BM   mupirocin ointment  1 application Nasal BID   spironolactone  12.5 mg Oral QODAY   vitamin C  500 mg Oral Daily   Continuous Infusions:    LOS: 3 days     Vernell Leep, MD, FACP, [facility]. [facility]  To contact the attending provider between 7A-7P or the covering provider during after hours 7P-7A, please log into the web site [email or web address] and access using universal [place] password for that web site. If you do not have the password, please call the hospital operator.  07/13/2019, 10:52 AM

## 2019-07-13 NOTE — Discharge Instructions (Addendum)
[facility] Discharge Instructions   General Discharge Instructions  WEIGHT BEARING STATUS: Weightbearing as tolerated Left leg, Nonweigtbearing Left upper extremity   RANGE OF MOTION/ACTIVITY: Unrestricted range of motion left hip and knee.   Left shoulder pendulums and Passive shoulder flexion and extension  Unrestricted active and passive ROM Left elbow, forearm, wrist and hand  Sling on for comfort and when mobilizing No lifting with left arm   Bone health: continue with vitamin d, calcium and vitamin C supplementation.  Will need to get bone density scan in the next 4-8 weeks  Wound Care: daily wound care starting upon discharge to nursing center. See below Discharge Wound Care Instructions  Do NOT apply any ointments, solutions or lotions to pin sites or surgical wounds.  These prevent needed drainage and even though solutions like hydrogen peroxide kill bacteria, they also damage cells lining the pin sites that help fight infection.  Applying lotions or ointments can keep the wounds moist and can cause them to breakdown and open up as well. This can increase the risk for infection. When in doubt call the office.  Surgical incisions should be dressed daily.  If any drainage is noted, use one layer of adaptic, then gauze, Kerlix, and an ace wrap.  Once the incision is completely dry and without drainage, it may be left open to air out.  Showering may begin 36-48 hours later.  Cleaning gently with soap and water.  Traumatic wounds should be dressed daily as well.    One layer of adaptic, gauze, Kerlix, then ace wrap.  The adaptic can be discontinued once the draining has ceased    If you have a wet to dry dressing: wet the gauze with saline the squeeze as much saline out so the gauze is moist (not soaking wet), place moistened gauze over wound, then place a dry gauze over the moist one, followed by Kerlix wrap, then ace wrap.   DVT/PE prophylaxis: Eliquis    Diet: as you were eating previously.  Can use over the counter stool softeners and bowel preparations, such as Miralax, to help with bowel movements.  Narcotics can be constipating.  Be sure to drink plenty of fluids  PAIN MEDICATION USE AND EXPECTATIONS  You have likely been given narcotic medications to help control your pain.  After a traumatic event that results in an fracture (broken bone) with or without surgery, it is ok to use narcotic pain medications to help control one's pain.  We understand that everyone responds to pain differently and each individual patient will be evaluated on a regular basis for the continued need for narcotic medications. Ideally, narcotic medication use should last no more than 6-8 weeks (coinciding with fracture healing).   As a patient it is your responsibility as well to monitor narcotic medication use and report the amount and frequency you use these medications when you come to your office visit.   We would also advise that if you are using narcotic medications, you should take a dose prior to therapy to maximize you participation.  IF YOU ARE ON NARCOTIC MEDICATIONS IT IS NOT PERMISSIBLE TO OPERATE A MOTOR VEHICLE (MOTORCYCLE/CAR/TRUCK/MOPED) OR HEAVY MACHINERY DO NOT MIX NARCOTICS WITH OTHER CNS (CENTRAL NERVOUS SYSTEM) DEPRESSANTS SUCH AS ALCOHOL   STOP SMOKING OR USING NICOTINE PRODUCTS!!!!  As discussed nicotine severely impairs your body's ability to heal surgical and traumatic wounds but also impairs bone healing.  Wounds and bone heal by forming microscopic blood vessels (angiogenesis) and  nicotine is a vasoconstrictor (essentially, shrinks blood vessels).  Therefore, if vasoconstriction occurs to these microscopic blood vessels they essentially disappear and are unable to deliver necessary nutrients to the healing tissue.  This is one modifiable factor that you can do to dramatically increase your chances of healing your injury.    (This means no  smoking, no nicotine gum, patches, etc)  DO NOT USE NONSTEROIDAL ANTI-INFLAMMATORY DRUGS (NSAID'S)  Using products such as Advil (ibuprofen), Aleve (naproxen), Motrin (ibuprofen) for additional pain control during fracture healing can delay and/or prevent the healing response.  If you would like to take over the counter (OTC) medication, Tylenol (acetaminophen) is ok.  However, some narcotic medications that are given for pain control contain acetaminophen as well. Therefore, you should not exceed more than 4000 mg of tylenol in a day if you do not have liver disease.  Also note that there are may OTC medicines, such as cold medicines and allergy medicines that my contain tylenol as well.  If you have any questions about medications and/or interactions please ask your doctor/PA or your pharmacist.      ICE AND ELEVATE INJURED/OPERATIVE EXTREMITY  Using ice and elevating the injured extremity above your heart can help with swelling and pain control.  Icing in a pulsatile fashion, such as 20 minutes on and 20 minutes off, can be followed.    Do not place ice directly on skin. Make sure there is a barrier between to skin and the ice pack.    Using frozen items such as frozen peas works well as the conform nicely to the are that needs to be iced.  USE AN ACE WRAP OR TED HOSE FOR SWELLING CONTROL  In addition to icing and elevation, Ace wraps or TED hose are used to help limit and resolve swelling.  It is recommended to use Ace wraps or TED hose until you are informed to stop.    When using Ace Wraps start the wrapping distally (farthest away from the body) and wrap proximally (closer to the body)   Example: If you had surgery on your leg or thing and you do not have a splint on, start the ace wrap at the toes and work your way up to the thigh        If you had surgery on your upper extremity and do not have a splint on, start the ace wrap at your fingers and work your way up to the upper arm  IF YOU ARE  IN A SPLINT OR CAST DO NOT [place]   If your splint gets wet for any reason please contact the office immediately. You may shower in your splint or cast as long as you keep it dry.  This can be done by wrapping in a cast cover or garbage back (or similar)  Do Not stick any thing down your splint or cast such as pencils, money, or hangers to try and scratch yourself with.  If you feel itchy take benadryl as prescribed on the bottle for itching  IF YOU ARE IN A CAM BOOT (BLACK BOOT)  You may remove boot periodically. Perform daily dressing changes as noted below.  Wash the liner of the boot regularly and wear a sock when wearing the boot. It is recommended that you sleep in the boot until told otherwise  CALL THE OFFICE WITH ANY QUESTIONS OR CONCERNS: [phone]     Additional discharge instructions  Please get your medications reviewed and  adjusted by your Primary MD.  Please request your Primary MD to go over all Hospital Tests and Procedure/Radiological results at the follow up, please get all Hospital records sent to your Prim MD by signing hospital release before you go home.  If you had Pneumonia of Lung problems at the Hospital: Please get a 2 view Chest X ray done in 6-8 weeks after hospital discharge or sooner if instructed by your Primary MD.  If you have Congestive Heart Failure: Please call your Cardiologist or Primary MD anytime you have any of the following symptoms:  1) 3 pound weight gain in 24 hours or 5 pounds in 1 week  2) shortness of breath, with or without a dry hacking cough  3) swelling in the hands, feet or stomach  4) if you have to sleep on extra pillows at night in order to breathe  Follow cardiac low salt diet and 1.5 lit/day fluid restriction.  If you have diabetes Accuchecks 4 times/day, Once in AM empty stomach and then before each meal. Log in all results and show them to your primary doctor at your next visit. If any glucose reading  is under 80 or above 300 call your primary MD immediately.  If you have Seizure/Convulsions/Epilepsy: Please do not drive, operate heavy machinery, participate in activities at heights or participate in high speed sports until you have seen by Primary MD or a Neurologist and advised to do so again.  If you had Gastrointestinal Bleeding: Please ask your Primary MD to check a complete blood count within one week of discharge or at your next visit. Your endoscopic/colonoscopic biopsies that are pending at the time of discharge, will also need to followed by your Primary MD.  Get Medicines reviewed and adjusted. Please take all your medications with you for your next visit with your Primary MD  Please request your Primary MD to go over all hospital tests and procedure/radiological results at the follow up, please ask your Primary MD to get all Hospital records sent to his/her office.  If you experience worsening of your admission symptoms, develop shortness of breath, life threatening emergency, suicidal or homicidal thoughts you must seek medical attention immediately by calling 911 or calling your MD immediately  if symptoms less severe.  You must read complete instructions/literature along with all the possible adverse reactions/side effects for all the Medicines you take and that have been prescribed to you. Take any new Medicines after you have completely understood and accpet all the possible adverse reactions/side effects.   Do not drive or operate heavy machinery when taking Pain medications.   Do not take more than prescribed Pain, Sleep and Anxiety Medications  Special Instructions: If you have smoked or chewed Tobacco  in the last 2 yrs please stop smoking, stop any regular Alcohol  and or any Recreational drug use.  Wear Seat belts while driving.  Please note You were cared for by a hospitalist during your hospital stay. If you have any questions about your discharge medications or  the care you received while you were in the hospital after you are discharged, you can call the unit and asked to speak with the hospitalist on call if the hospitalist that took care of you is not available. Once you are discharged, your primary care physician will handle any further medical issues. Please note that NO REFILLS for any discharge medications will be authorized once you are discharged, as it is imperative that you return to your primary care  physician (or establish a relationship with a primary care physician if you do not have one) for your aftercare needs so that they can reassess your need for medications and monitor your lab values.  You can reach the hospitalist office at phone [phone] or fax [phone]   If you do not have a primary care physician, you can call [phone] for a physician referral.

## 2019-07-13 NOTE — Progress Notes (Signed)
Subjective: Patient reports pain as controlled with Tylenol.  Tolerating diet. No CP, SOB.   Up to chair yesterday-moderate assist with good tolerance with exercises.   Feeling "full" and need to have a bowel movement- bowel regimen discussed with nursing.  No N/V or abd pain.  Previously ambulatory without aid in her home.  Walker when out of the house.  Objective:   VITALS:   Vitals:   07/12/19 1426 07/12/19 1457 07/12/19 2053 07/13/19 0452  BP: (!) 110/92 115/65 (!) 107/59 (!) 109/53  Pulse: 83 66 79 85  Resp: 14 14    Temp: 98.3 F (36.8 C)  97.7 F (36.5 C) 99 F (37.2 C)  TempSrc: Oral  Oral Axillary  SpO2: 91% 94% 98% 95%  Weight:      Height:       CBC Latest Ref Rng & Units 07/12/2019 07/10/2019 04/11/2018  WBC 4.0 - 10.5 K/uL 11.9(H) 9.0 -  Hemoglobin 12.0 - 15.0 g/dL 9.9(L) 12.8 14.6  Hematocrit 36.0 - 46.0 % 31.4(L) 39.7 43.0  Platelets 150 - 400 K/uL 127(L) 166 -   BMP Latest Ref Rng & Units 07/13/2019 07/12/2019 07/10/2019  Glucose 70 - 99 mg/dL 110(H) 128(H) 144(H)  BUN 8 - 23 mg/dL 23 27(H) 29(H)  Creatinine 0.44 - 1.00 mg/dL 1.09(H) 1.32(H) 1.50(H)  BUN/Creat Ratio 12 - 28 - - -  Sodium 135 - 145 mmol/L 136 134(L) 138  Potassium 3.5 - 5.1 mmol/L 4.2 4.6 4.5  Chloride 98 - 111 mmol/L 101 100 102  CO2 22 - 32 mmol/L 25 26 30   Calcium 8.9 - 10.3 mg/dL 8.1(L) 8.2(L) 8.7(L)   Intake/Output      07/18 0701 - 07/19 0700 07/19 0701 - 07/20 0700   P.O. 720    I.V. (mL/kg) 202.2 (3.6)    IV Piggyback     Total Intake(mL/kg) 922.2 (16.3)    Urine (mL/kg/hr) 1200 (0.9)    Stool 200    Blood     Total Output 1400    Net -477.8         Urine Occurrence 2 x       Physical Exam: General: NAD.  Upright in bed with breakfast tray.  Calm, conversant. MSK LUE:  Sling and ice in place.  Mild tenderness upper arm in the area of fracture.  Fully neurovascularly intact distally. LLE: Neurovascularly intact Sensation intact distally Feet warm  Dorsiflexion/Plantar flexion intact Incision: Proximal dressing with scant bloody drainage (changed 07/12/19).     Assessment / Plan: 2 Days Post-Op  S/P Procedure(s) (LRB): INTRAMEDULLARY (IM) NAIL INTERTROCHANTRIC (Left) by Dr. Marcelino Scot on 07/11/2019  Principal Problem:   Closed left hip fracture, initial encounter [facility]) Active Problems:   Paroxysmal atrial tachycardia (H[facility])   Senile osteoporosis   Chronic systolic CHF (congestive heart failure) ([place])   History of pulmonary embolism   Closed fracture of left proximal humerus   Closed left intertrochanteric femur fracture, status post IM nail Doing well postop day 2  Pain controlled Early mobilization going well  Closed left distal humerus fracture Nonoperative management NWB LUE.  No lifting left arm Sling for comfort Initiate early shoulder pendulum exercises and passive movement as pain allows No resistance exercise for 6 weeks. Likely start active assisted motion in ~4 weeks  Incentive spirometry Ice PRN  Weightbearing: WBAT LLE.  NWB LUE. Insicional and dressing care: Mepilex PRN Orthopedic device(s): Sling for comfort LUE Showering: Keep dressing dry VTE prophylaxis: Eliquis resumed, SCDs, ambulation Pain  control: Minimize narcotics.  Tylenol.  Norco for breakthrough pain. Follow - up plan: Dr. Marcelino Scot in 1 to 2 weeks  Dispo: Therapy evaluations ongoing.  Discharge when mobilized, and ready medically.    Prudencio Burly III, PA-C 07/13/2019, 8:46 AM

## 2019-07-13 NOTE — Progress Notes (Signed)
Working to wean pt off of O2 however, pt starts to desat in the 70s when without oxygen. Mostly when talking will continue use of incentive spirometer and continue to monitor pt.

## 2019-07-14 ENCOUNTER — Encounter (HOSPITAL_COMMUNITY): Payer: Self-pay | Admitting: Orthopedic Surgery

## 2019-07-14 DIAGNOSIS — S42302D Unspecified fracture of shaft of humerus, left arm, subsequent encounter for fracture with routine healing: Secondary | ICD-10-CM | POA: Diagnosis not present

## 2019-07-14 DIAGNOSIS — W19XXXD Unspecified fall, subsequent encounter: Secondary | ICD-10-CM | POA: Diagnosis not present

## 2019-07-14 DIAGNOSIS — I480 Paroxysmal atrial fibrillation: Secondary | ICD-10-CM | POA: Diagnosis not present

## 2019-07-14 DIAGNOSIS — Z86711 Personal history of pulmonary embolism: Secondary | ICD-10-CM | POA: Diagnosis not present

## 2019-07-14 DIAGNOSIS — S42202D Unspecified fracture of upper end of left humerus, subsequent encounter for fracture with routine healing: Secondary | ICD-10-CM

## 2019-07-14 DIAGNOSIS — Z4789 Encounter for other orthopedic aftercare: Secondary | ICD-10-CM | POA: Diagnosis not present

## 2019-07-14 DIAGNOSIS — I471 Supraventricular tachycardia: Secondary | ICD-10-CM | POA: Diagnosis not present

## 2019-07-14 DIAGNOSIS — S7292XD Unspecified fracture of left femur, subsequent encounter for closed fracture with routine healing: Secondary | ICD-10-CM | POA: Diagnosis not present

## 2019-07-14 DIAGNOSIS — S72142D Displaced intertrochanteric fracture of left femur, subsequent encounter for closed fracture with routine healing: Secondary | ICD-10-CM | POA: Diagnosis not present

## 2019-07-14 DIAGNOSIS — R000 Tachycardia, unspecified: Secondary | ICD-10-CM | POA: Diagnosis not present

## 2019-07-14 DIAGNOSIS — I10 Essential (primary) hypertension: Secondary | ICD-10-CM | POA: Diagnosis not present

## 2019-07-14 DIAGNOSIS — M810 Age-related osteoporosis without current pathological fracture: Secondary | ICD-10-CM | POA: Diagnosis not present

## 2019-07-14 DIAGNOSIS — N183 Chronic kidney disease, stage 3 (moderate): Secondary | ICD-10-CM | POA: Diagnosis not present

## 2019-07-14 DIAGNOSIS — J449 Chronic obstructive pulmonary disease, unspecified: Secondary | ICD-10-CM | POA: Diagnosis not present

## 2019-07-14 DIAGNOSIS — M2550 Pain in unspecified joint: Secondary | ICD-10-CM | POA: Diagnosis not present

## 2019-07-14 DIAGNOSIS — R5381 Other malaise: Secondary | ICD-10-CM | POA: Diagnosis not present

## 2019-07-14 DIAGNOSIS — N179 Acute kidney failure, unspecified: Secondary | ICD-10-CM | POA: Diagnosis not present

## 2019-07-14 DIAGNOSIS — D62 Acute posthemorrhagic anemia: Secondary | ICD-10-CM | POA: Diagnosis not present

## 2019-07-14 DIAGNOSIS — I959 Hypotension, unspecified: Secondary | ICD-10-CM | POA: Diagnosis not present

## 2019-07-14 DIAGNOSIS — M6281 Muscle weakness (generalized): Secondary | ICD-10-CM | POA: Diagnosis not present

## 2019-07-14 DIAGNOSIS — E785 Hyperlipidemia, unspecified: Secondary | ICD-10-CM | POA: Diagnosis not present

## 2019-07-14 DIAGNOSIS — Z7901 Long term (current) use of anticoagulants: Secondary | ICD-10-CM

## 2019-07-14 DIAGNOSIS — S72002D Fracture of unspecified part of neck of left femur, subsequent encounter for closed fracture with routine healing: Secondary | ICD-10-CM

## 2019-07-14 DIAGNOSIS — H3530 Unspecified macular degeneration: Secondary | ICD-10-CM | POA: Diagnosis not present

## 2019-07-14 DIAGNOSIS — D696 Thrombocytopenia, unspecified: Secondary | ICD-10-CM | POA: Diagnosis not present

## 2019-07-14 DIAGNOSIS — T8140XA Infection following a procedure, unspecified, initial encounter: Secondary | ICD-10-CM | POA: Diagnosis not present

## 2019-07-14 DIAGNOSIS — I5022 Chronic systolic (congestive) heart failure: Secondary | ICD-10-CM | POA: Diagnosis not present

## 2019-07-14 DIAGNOSIS — H25813 Combined forms of age-related cataract, bilateral: Secondary | ICD-10-CM | POA: Diagnosis not present

## 2019-07-14 DIAGNOSIS — R262 Difficulty in walking, not elsewhere classified: Secondary | ICD-10-CM | POA: Diagnosis not present

## 2019-07-14 DIAGNOSIS — K5900 Constipation, unspecified: Secondary | ICD-10-CM | POA: Diagnosis not present

## 2019-07-14 DIAGNOSIS — R040 Epistaxis: Secondary | ICD-10-CM | POA: Diagnosis not present

## 2019-07-14 DIAGNOSIS — Z7401 Bed confinement status: Secondary | ICD-10-CM | POA: Diagnosis not present

## 2019-07-14 LAB — CBC
HCT: 25.5 % — ABNORMAL LOW (ref 36.0–46.0)
Hemoglobin: 8.3 g/dL — ABNORMAL LOW (ref 12.0–15.0)
MCH: 32 pg (ref 26.0–34.0)
MCHC: 32.5 g/dL (ref 30.0–36.0)
MCV: 98.5 fL (ref 80.0–100.0)
Platelets: 135 10*3/uL — ABNORMAL LOW (ref 150–400)
RBC: 2.59 MIL/uL — ABNORMAL LOW (ref 3.87–5.11)
RDW: 14.5 % (ref 11.5–15.5)
WBC: 10.7 10*3/uL — ABNORMAL HIGH (ref 4.0–10.5)
nRBC: 0 % (ref 0.0–0.2)

## 2019-07-14 LAB — SARS CORONAVIRUS 2 BY RT PCR (HOSPITAL ORDER, PERFORMED IN ~~LOC~~ HOSPITAL LAB): SARS Coronavirus 2: NEGATIVE

## 2019-07-14 LAB — VITAMIN D 25 HYDROXY (VIT D DEFICIENCY, FRACTURES): Vit D, 25-Hydroxy: 39.5 ng/mL (ref 30.0–100.0)

## 2019-07-14 MED ORDER — POLYETHYLENE GLYCOL 3350 17 G PO PACK: 17.0000 g | PACK | Freq: Two times a day (BID) | ORAL | Status: DC

## 2019-07-14 MED ORDER — VITAMIN D3 25 MCG PO TABS: 2000.0000 [IU] | ORAL_TABLET | Freq: Two times a day (BID) | ORAL | 0 refills | Status: DC

## 2019-07-14 MED ORDER — BISACODYL 10 MG RE SUPP: 10.0000 mg | Freq: Every day | RECTAL | Status: DC | PRN

## 2019-07-14 MED ORDER — ASCORBIC ACID 500 MG PO TABS: 500.0000 mg | ORAL_TABLET | Freq: Every day | ORAL | 0 refills | Status: DC

## 2019-07-14 MED ORDER — ENSURE ENLIVE PO LIQD: 237.0000 mL | Freq: Two times a day (BID) | ORAL | 12 refills | Status: DC

## 2019-07-14 MED ORDER — ACETAMINOPHEN 325 MG PO TABS: 325.0000 mg | ORAL_TABLET | Freq: Four times a day (QID) | ORAL | Status: DC | PRN

## 2019-07-14 MED ORDER — DOCUSATE SODIUM 100 MG PO CAPS: 100.0000 mg | ORAL_CAPSULE | Freq: Two times a day (BID) | ORAL | Status: DC

## 2019-07-14 NOTE — Plan of Care (Signed)

## 2019-07-14 NOTE — Discharge Summary (Signed)
Physician Discharge Summary  Miranda Ford XKG:[id number] DOB: 09-07-1924  PCP: Miranda Sites, MD  Admit date: 07/10/2019 Discharge date: 07/14/2019  Recommendations for Outpatient Follow-up:  1. MD at SNF in 2 to 3 days with repeat labs (CBC & BMP). 2. Recommend bone density scan to be done within the next 4 weeks and consider initiating bisphosphonates. 3. Dr. Altamese Ford, Orthopedics in 3 weeks 4. Dr. Sharilyn Ford, PCP upon discharge from SNF.  Home Health: Patient being discharged to SNF/Miranda Ford. Equipment/Devices: LUE sling. Rest TBD at SNF.  Discharge Condition: Improved and stable CODE STATUS: DNR Diet recommendation: Heart healthy diet.  Orthopedics team instructions as follows:   Weightbearing: WBAT L LEx, NWB L Upper Extremity  Insicional and dressing care: OK to remove dressings as of now and leave open to air with dry gauze PRN Orthopedic device(s): sling, walker or other suitable assistive device Showering: ok to shower and clean wound with soap and water VTE prophylaxis: on chronic apixaban  .  Pain control: tylenol  Follow - up plan: 2-3 weeks Contact information:  Miranda Powellton MD, Miranda Spinner, PA-C   Discharge Diagnoses:  Principal Problem:   Closed left hip fracture, initial encounter [facility]) Active Problems:   Paroxysmal atrial tachycardia (HCC)   Senile osteoporosis   Chronic systolic CHF (congestive heart failure) ([place])   History of pulmonary embolism   Closed fracture of left proximal humerus   Brief Summary: 85 year old female, lives alone, fairly independent, uses a cane when she goes out of her house, PMH of NICM, chronic systolic CHF, PAT, pulmonary embolism on long-term apixaban per PCP presented to the ED on 07/10/2019 after a mechanical fall to her left side while she was gardening without her cane.  She was admitted for closed left intertrochanteric femur fracture and closed left distal humerus fracture.  S/p IM nail/ORIF of left hip  fracture on 7/17.  Left humerus fracture for nonoperative management.   Assessment & Plan:  Closed left intertrochanteric femur fracture  Sustained status post mechanical fall in the context of underlying osteoporosis.  Orthopedics consulted and patient underwent ORIF/IM nail on 7/17  As per orthopedic follow-up: WBAT LLE, Mepilex dressing PRN, keep dressing dry, Eliquis resumed and continue mobilization. PT recommends SNF and patient will be discharging to SNF today for rehab.  Pain adequately controlled on acetaminophen alone.  Outpatient follow-up with Dr. Marcelino Ford in 2-3 weeks.  Orthopedics follow-up appreciated, have cleared her for discharge to SNF.  Acute left humerus fracture  As per orthopedic follow-up, nonoperative management.  NWB LUE.  No lifting of left arm.  Sling for comfort.  Initiate early shoulder pendulum exercises and passive movement as pain allows.  No resistance exercise for 6 weeks.  Likely start active assisted motion in 4 weeks.  Stable without change.  Postop acute blood loss anemia  Secondary to fracture and recent surgery.  Hemoglobin had gradually dropped 12.8 preop >9.9 >8.5.  Hemoglobin stable compared to yesterday/8.3 today.  Asymptomatic.  Closely follow CBC at SNF and transfuse if hemoglobin 7 or less.  No active bleeding noted.  Thrombocytopenia, mild  Likely related to acute blood loss.  No overt bleeding.  Improving, up from 120-135.  Follow CBC closely at SNF.  Expected to improve and normalize soon.  Acute kidney injury complicating stage III chronic kidney disease  Baseline creatinine in the 1.1 range.  Creatinine increased to 1.5 on 7/16.  Resolved after brief and gentle IV fluids.  Periodically follow BMP at SNF.  NICM/chronic systolic CHF  Follows with Dr. Domenic Ford, Cardiology  Compensated.  On spironolactone 12.5 mg every other day.  Paroxysmal atrial tachycardia  Currently in sinus rhythm.  Continue low-dose  amiodarone.    On review of her home medications again today, it does appear that patient was on carvedilol 3.125 mg twice daily which she was not on in the hospital but will be resumed at discharge.  History of pulmonary embolus  Remains on long-term Eliquis as per her PCP, continue.  COPD  Noted on chest x-ray.    Aggressive incentive spirometry use.  Was briefly on oxygen in the hospital.  Has no respiratory symptoms.  Lungs clear to auscultation.  Likely to be weaned off prior to discharge to SNF this afternoon.  Monitor oxygen saturations at SNF.  Osteoporosis  As per orthopedic team, continue calcium supplements, vitamin D, vitamin C newly started.    Recommend getting bone density scan to evaluate within the next 4 weeks.  Constipation  Multifactorial due to immobilization, poor oral intake, opioids.  Aggressive bowel regimen and monitor.  Had multiple BMs since yesterday.   Consultants:  Orthopedics  Procedures:  IM nail for left hip fracture on 7/17 Left shoulder sling for humerus fracture.  Discharge Instructions  Discharge Instructions    (HEART FAILURE PATIENTS) Call MD:  Anytime you have any of the following symptoms: 1) 3 pound weight gain in 24 hours or 5 pounds in 1 week 2) shortness of breath, with or without a dry hacking cough 3) swelling in the hands, feet or stomach 4) if you have to sleep on extra pillows at night in order to breathe.   Complete by: As directed    Call MD for:  difficulty breathing, headache or visual disturbances   Complete by: As directed    Call MD for:  extreme fatigue   Complete by: As directed    Call MD for:  persistant dizziness or light-headedness   Complete by: As directed    Call MD for:  persistant nausea and vomiting   Complete by: As directed    Call MD for:  redness, tenderness, or signs of infection (pain, swelling, redness, odor or green/yellow discharge around incision site)   Complete by: As directed     Call MD for:  severe uncontrolled pain   Complete by: As directed    Call MD for:  temperature >100.4   Complete by: As directed    Diet - low sodium heart healthy   Complete by: As directed    Increase activity slowly   Complete by: As directed        Medication List    TAKE these medications   acetaminophen 325 MG tablet Commonly known as: TYLENOL Take 1-2 tablets (325-650 mg total) by mouth every 6 (six) hours as needed for mild pain (pain score 1-3 or temp > 100.5).   amiodarone 200 MG tablet Commonly known as: PACERONE TAKE (1/2) TABLET BY MOUTH ONCE DAILY. What changed: See the new instructions.   ascorbic acid 500 MG tablet Commonly known as: VITAMIN C Take 1 tablet (500 mg total) by mouth daily. Start taking on: July 15, 2019   atorvastatin 40 MG tablet Commonly known as: LIPITOR Take 1 tablet (40 mg total) by mouth at bedtime.   bisacodyl 10 MG suppository Commonly known as: DULCOLAX Place 1 suppository (10 mg total) rectally daily as needed for moderate constipation.   calcium carbonate 1500 (600 Ca) MG Tabs tablet Commonly known as:  OSCAL Take 1,500 mg by mouth daily.   carvedilol 3.125 MG tablet Commonly known as: COREG TAKE 1 TABLET BY MOUTH TWICE DAILY WITH A MEAL. What changed: See the new instructions.   docusate sodium 100 MG capsule Commonly known as: COLACE Take 1 capsule (100 mg total) by mouth 2 (two) times daily.   Eliquis 2.5 MG Tabs tablet Generic drug: apixaban TAKE 1 TABLET BY MOUTH AT 10AM AND 1 TABLET AT 9PM. What changed: See the new instructions.   feeding supplement (ENSURE ENLIVE) Liqd Take 237 mLs by mouth 2 (two) times daily between meals.   ICAPS PO Take 1 capsule by mouth daily.   polyethylene glycol 17 g packet Commonly known as: MIRALAX / GLYCOLAX Take 17 g by mouth 2 (two) times daily.   spironolactone 25 MG tablet Commonly known as: ALDACTONE Take 0.5 tablets (12.5 mg total) by mouth every other day.    Vitamin D3 25 MCG tablet Commonly known as: Vitamin D Take 2 tablets (2,000 Units total) by mouth 2 (two) times daily.       Contact information for follow-up providers    Miranda Contra Costa, MD. Schedule an appointment as soon as possible for a visit in 3 week(s).   Specialty: Orthopedic Surgery Contact information: [place] [address] [phone]        MD at SNF. Schedule an appointment as soon as possible for a visit.   Why: To be seen in 2 to 3 days with repeat labs (CBC & BMP).       Miranda Sites, MD. Schedule an appointment as soon as possible for a visit.   Specialty: Family Medicine Why: Make an appointment upon discharge from SNF. Contact information: [place] [address] [phone]        Satira Sark, MD .   Specialty: Cardiology Contact information: [place] [address] [phone]            Contact information for after-discharge care    [facility] .   Service: Skilled Nursing Contact information: [address] [address] [phone]                 Allergies  Allergen Reactions  . Codeine Nausea Only  . Penicillins Other (See Comments)    "Not allergic just a lot of my family members are allergic". Has patient had a PCN reaction causing immediate rash, facial/tongue/throat swelling, SOB or lightheadedness with hypotension: No Has patient had a PCN reaction causing severe rash involving mucus membranes or skin necrosis: No Has patient had a PCN reaction that required hospitalization No Has patient had a PCN reaction occurring within the last 10 years: No If all of the above answers are "NO", then may proceed with Cephalosporin use.       Procedures/Studies: Dg Chest 1 View  Result Date: 07/10/2019 CLINICAL DATA:  Fall to left side.  Left shoulder and hip pain EXAM: CHEST  1 VIEW COMPARISON:   Chest radiograph 07/11/2016, chest CT 07/03/2016 FINDINGS: Redemonstration of the fibrotic and emphysematous changes lungs seen on prior radiograph and cross-sectional imaging. No focal consolidative process. No pneumothorax or effusion. No visible displaced rib fracture. Cardiac size is at the upper limits of normal the possibly accentuated by supine imaging. The aorta is calcified and tortuous. Mild dextrocurvature of the lower thoracic spine with discogenic changes. IMPRESSION: No acute cardiopulmonary abnormality. Chronic coarse interstitial changes.  Emphysema (ICD10-J43.9). Aortic Atherosclerosis (ICD10-I70.0). Electronically Signed   By: Lovena Le M.D.   On: 07/10/2019 22:13   Dg Shoulder 1v Left  Result Date: 07/10/2019 CLINICAL DATA:  Fall to left side, left shoulder and hip pain EXAM: LEFT SHOULDER - 1 VIEW COMPARISON:  None. FINDINGS: Unable to position patient for additional views due to pain. Single view demonstrates a left humeral fracture through the surgical neck with likely fracture line extension into the greater tuberosity. There is significant overlying soft tissue swelling. Glenohumeral acromioclavicular alignment is maintained. Included portion of the left chest wall is unremarkable. The lungs demonstrate coarse interstitial opacities with aortic atherosclerosis. Cardiac leads overlie the chest. IMPRESSION: Limited views due to patient pain. Fracture through the surgical neck of the left humerus, alignment and fracture extension difficult to assess on single view. Electronically Signed   By: Lovena Le M.D.   On: 07/10/2019 22:10   Pelvis Portable  Result Date: 07/11/2019 CLINICAL DATA:  Postoperative left hip ORIF. EXAM: PORTABLE PELVIS 1-2 VIEWS COMPARISON:  Same day operative fluoroscopy FINDINGS: Postsurgical changes from a left femoral ORIF with intramedullary nail and transcervical fixation rod. Overlying soft tissue swelling and gas are expected postoperatively. Portions of  the ischial tuberosities collimated. Remaining visualized bones of the pelvis appear congruent although the sacrum is poorly evaluated due to overlying bowel gas of the visual sacral arcs are intact. Discogenic changes are present in the lower lumbar spine most pronounced at L4-5 L5-S1. Mild degenerative changes in both hips. Cholecystectomy clips in the right upper quadrant. Bowel gas pattern is normal. IMPRESSION: Expected postoperative appearance following left femoral open reduction internal fixation. Electronically Signed   By: Lovena Le M.D.   On: 07/11/2019 16:22   Dg Shoulder Left  Result Date: 07/11/2019 CLINICAL DATA:  Fracture of the left humerus seen 1 day prior EXAM: LEFT SHOULDER - 2+ VIEW COMPARISON:  Left shoulder radiograph 07/10/2019 FINDINGS: Redemonstration of a minimally comminuted fracture through the surgical neck of the left humerus with involvement of the greater and lesser tuberosities. There is foreshortening and slight anterior displacement across the fracture line. Anteroinferior displacement of the humeral head relative to the glenoid is new from prior exam. IMPRESSION: Redemonstration of a comminuted, foreshortened fracture through the surgical neck left humerus Inferior displacement of the humeral head likely reflects left shoulder effusion/hemarthrosis. Electronically Signed   By: Lovena Le M.D.   On: 07/11/2019 16:27   Dg C-arm 1-60 Min  Result Date: 07/11/2019 CLINICAL DATA:  The patient suffered a left intertrochanteric fracture in a fall 07/10/2019. Intraoperative imaging fixation. Initial encounter. EXAM: LEFT FEMUR 2 VIEWS; DG C-ARM 61-120 MIN COMPARISON:  Plain films left hip 07/10/2019. FINDINGS: Six fluoroscopic spot views of the left hip demonstrate placement of a hip screw and long intramedullary nail with a single distal screw for fixation of an intertrochanteric fracture. Hardware is intact. Position and alignment anatomic. No acute finding. IMPRESSION:  Intraoperative imaging for fixation of a left intertrochanteric fracture. No acute finding. Electronically Signed   By: Inge Rise M.D.   On: 07/11/2019 13:04   Dg Hip Unilat W Or Wo Pelvis 2-3 Views Left  Result Date: 07/10/2019 CLINICAL DATA:  Fall to left side. Left shoulder and left hip pain. Osteoporosis. EXAM: DG HIP (WITH OR WITHOUT PELVIS) 2-3V LEFT COMPARISON:  None. FINDINGS: Intertrochanteric left femur fracture with mild varus angulation and foreshortening across the fracture line. Femoral heads remain normally located. Remaining bones of the pelvis are congruent. Evaluation the  sacrum is limited by overlying bowel gas of the visualized sacral are not are maintained. Discogenic and facet degenerative changes are present in the included lumbar spine. Bowel gas pattern is nonobstructive. Vascular calcified fully bolus are noted in the pelvis. Corticated mineralization adjacent to the anterior superior iliac spine may reflect injection granuloma. IMPRESSION: Mildly foreshortened and varus angulated intertrochanteric left femur fracture. Electronically Signed   By: Lovena Le M.D.   On: 07/10/2019 22:07   Dg Femur Min 2 Views Left  Result Date: 07/11/2019 CLINICAL DATA:  The patient suffered a left intertrochanteric fracture in a fall 07/10/2019. Intraoperative imaging fixation. Initial encounter. EXAM: LEFT FEMUR 2 VIEWS; DG C-ARM 61-120 MIN COMPARISON:  Plain films left hip 07/10/2019. FINDINGS: Six fluoroscopic spot views of the left hip demonstrate placement of a hip screw and long intramedullary nail with a single distal screw for fixation of an intertrochanteric fracture. Hardware is intact. Position and alignment anatomic. No acute finding. IMPRESSION: Intraoperative imaging for fixation of a left intertrochanteric fracture. No acute finding. Electronically Signed   By: Inge Rise M.D.   On: 07/11/2019 13:04   Dg Femur Port Min 2 Views Left  Result Date: 07/11/2019 CLINICAL  DATA:  Postoperative radiograph left hip open reduction internal fixation. EXAM: LEFT FEMUR PORTABLE 2 VIEWS COMPARISON:  Radiograph 07/10/2019, same day operative fluoroscopy. FINDINGS: Postsurgical changes from a left femoral open reduction internal fixation transfixing the intertrochanteric left femur fracture identified 1 day prior. Hardware appears intact without acute postsurgical complication. Included portions of the pelvis are unremarkable. There is soft tissue gas along the lateral thigh, expected postoperatively. Catheter tubing projects over the pelvis. Vascular calcium noted in the medial thighs. IMPRESSION: Expected postoperative appearance following left femoral open reduction internal fixation of an intertrochanteric femur fracture. Electronically Signed   By: Lovena Le M.D.   On: 07/11/2019 16:24      Subjective: States that she feels much better.  Had 3 BMs since yesterday and feels "unclogged".  Mild appropriate postop pain in left lower extremity controlled with Tylenol alone. She is careful moving left upper extremity with restrictions.  She would like to go to [facility] in [place] if possible because her niece lives close by and can visit her as needed.  Interviewed and examined along with her RN at bedside.  Discharge Exam:  Vitals:   07/13/19 1936 07/14/19 0353 07/14/19 0805 07/14/19 1418  BP: 111/70 125/72 105/64 124/70  Pulse: 85 76 71 74  Resp:   14 14  Temp: 98.7 F (37.1 C) 98.5 F (36.9 C) 98.2 F (36.8 C) 98.6 F (37 C)  TempSrc: Oral Axillary Oral Oral  SpO2: 100% 95% 98% 99%  Weight:      Height:        General exam: Pleasant elderly female, moderately built and frail lying comfortably supine in bed without distress. Respiratory system: Clear to auscultation.  No wheezing, rhonchi or crackles.  No increased work of breathing. Cardiovascular system: S1 and S2 heard, RRR.  No JVD, murmurs or pedal edema. Gastrointestinal system: Abdomen is  nondistended, soft and nontender.  No organomegaly or masses appreciated.  Normal bowel sounds heard. Central nervous system: Alert and oriented. No focal neurological deficits. Extremities: Right limbs grade 5 x 5 power.  Left upper extremity in sling but normal movements and strength of left fingers and wrist.  Left lower extremity power assessment limited secondary to postop pain but strength appears to be normal.  Left upper thigh postop dressing  with old minimally soiling from Saturday which has not worsened.  No other acute findings noted. Skin: No rashes, lesions or ulcers Psychiatry: Judgement and insight appear normal. Mood & affect appropriate.     The results of significant diagnostics from this hospitalization (including imaging, microbiology, ancillary and laboratory) are listed below for reference.     Microbiology: Recent Results (from the past 240 hour(s))  SARS Coronavirus 2 (CEPHEID - Performed in [facility] hospital lab), Hosp Order     Status: None   Collection Time: 07/10/19 10:11 PM   Specimen: Nasopharyngeal Swab  Result Value Ref Range Status   SARS Coronavirus 2 NEGATIVE NEGATIVE Final    Comment: (NOTE) If result is NEGATIVE SARS-CoV-2 target nucleic acids are NOT DETECTED. The SARS-CoV-2 RNA is generally detectable in upper and lower  respiratory specimens during the acute phase of infection. The lowest  concentration of SARS-CoV-2 viral copies this assay can detect is 250  copies / mL. A negative result does not preclude SARS-CoV-2 infection  and should not be used as the sole basis for treatment or other  patient management decisions.  A negative result may occur with  improper specimen collection / handling, submission of specimen other  than nasopharyngeal swab, presence of viral mutation(s) within the  areas targeted by this assay, and inadequate number of viral copies  (<250 copies / mL). A negative result must be combined with clinical  observations,  patient history, and epidemiological information. If result is POSITIVE SARS-CoV-2 target nucleic acids are DETECTED. The SARS-CoV-2 RNA is generally detectable in upper and lower  respiratory specimens dur ing the acute phase of infection.  Positive  results are indicative of active infection with SARS-CoV-2.  Clinical  correlation with patient history and other diagnostic information is  necessary to determine patient infection status.  Positive results do  not rule out bacterial infection or co-infection with other viruses. If result is PRESUMPTIVE POSTIVE SARS-CoV-2 nucleic acids MAY BE PRESENT.   A presumptive positive result was obtained on the submitted specimen  and confirmed on repeat testing.  While 2019 novel coronavirus  (SARS-CoV-2) nucleic acids may be present in the submitted sample  additional confirmatory testing may be necessary for epidemiological  and / or clinical management purposes  to differentiate between  SARS-CoV-2 and other Sarbecovirus currently known to infect humans.  If clinically indicated additional testing with an alternate test  methodology [phone]) is advised. The SARS-CoV-2 RNA is generally  detectable in upper and lower respiratory sp ecimens during the acute  phase of infection. The expected result is Negative. Fact Sheet for Patients:  StrictlyIdeas.no Fact Sheet for Healthcare Providers: BankingDealers.co.za This test is not yet approved or cleared by the [place] FDA and has been authorized for detection and/or diagnosis of SARS-CoV-2 by FDA under an Emergency Use Authorization (EUA).  This EUA will remain in effect (meaning this test can be used) for the duration of the COVID-19 declaration under Section 564(b)(1) of the Act, 21 U.S.C. section 360bbb-3(b)(1), unless the authorization is terminated or revoked sooner. Performed at [facility], [address], [place], [place] [address]    Surgical PCR screen     Status: None   Collection Time: 07/11/19  5:45 AM   Specimen: Nasal Mucosa; Nasal Swab  Result Value Ref Range Status   MRSA, PCR NEGATIVE NEGATIVE Final   Staphylococcus aureus NEGATIVE NEGATIVE Final    Comment: (NOTE) The Xpert SA Assay (FDA approved for NASAL specimens in patients 22 years  of age and older), is one component of a comprehensive surveillance program. It is not intended to diagnose infection nor to guide or monitor treatment. Performed at [facility], [place] [address] [place], [place] [address]   SARS Coronavirus 2 (CEPHEID - Performed in [facility] hospital lab), Hosp Order     Status: None   Collection Time: 07/14/19 12:33 PM   Specimen: Nasopharyngeal Swab  Result Value Ref Range Status   SARS Coronavirus 2 NEGATIVE NEGATIVE Final    Comment: (NOTE) If result is NEGATIVE SARS-CoV-2 target nucleic acids are NOT DETECTED. The SARS-CoV-2 RNA is generally detectable in upper and lower  respiratory specimens during the acute phase of infection. The lowest  concentration of SARS-CoV-2 viral copies this assay can detect is 250  copies / mL. A negative result does not preclude SARS-CoV-2 infection  and should not be used as the sole basis for treatment or other  patient management decisions.  A negative result may occur with  improper specimen collection / handling, submission of specimen other  than nasopharyngeal swab, presence of viral mutation(s) within the  areas targeted by this assay, and inadequate number of viral copies  (<250 copies / mL). A negative result must be combined with clinical  observations, patient history, and epidemiological information. If result is POSITIVE SARS-CoV-2 target nucleic acids are DETECTED. The SARS-CoV-2 RNA is generally detectable in upper and lower  respiratory specimens dur ing the acute phase of infection.  Positive  results are indicative of active infection with SARS-CoV-2.  Clinical   correlation with patient history and other diagnostic information is  necessary to determine patient infection status.  Positive results do  not rule out bacterial infection or co-infection with other viruses. If result is PRESUMPTIVE POSTIVE SARS-CoV-2 nucleic acids MAY BE PRESENT.   A presumptive positive result was obtained on the submitted specimen  and confirmed on repeat testing.  While 2019 novel coronavirus  (SARS-CoV-2) nucleic acids may be present in the submitted sample  additional confirmatory testing may be necessary for epidemiological  and / or clinical management purposes  to differentiate between  SARS-CoV-2 and other Sarbecovirus currently known to infect humans.  If clinically indicated additional testing with an alternate test  methodology [phone]) is advised. The SARS-CoV-2 RNA is generally  detectable in upper and lower respiratory sp ecimens during the acute  phase of infection. The expected result is Negative. Fact Sheet for Patients:  StrictlyIdeas.no Fact Sheet for Healthcare Providers: BankingDealers.co.za This test is not yet approved or cleared by the [place] FDA and has been authorized for detection and/or diagnosis of SARS-CoV-2 by FDA under an Emergency Use Authorization (EUA).  This EUA will remain in effect (meaning this test can be used) for the duration of the COVID-19 declaration under Section 564(b)(1) of the Act, 21 U.S.C. section 360bbb-3(b)(1), unless the authorization is terminated or revoked sooner. Performed at [facility], [place] [address] [place], [place] [address]      Labs: CBC: Recent Labs  Lab 07/10/19 2112 07/12/19 0411 07/13/19 0904 07/14/19 0414  WBC 9.0 11.9* 11.1* 10.7*  NEUTROABS 7.0  --   --   --   HGB 12.8 9.9* 8.5* 8.3*  HCT 39.7 31.4* 26.4* 25.5*  MCV 99.3 100.0 100.4* 98.5  PLT 166 127* 120* 941*   Basic Metabolic Panel: Recent Labs  Lab  07/10/19 2112 07/12/19 0728 07/13/19 0356  NA 138 134* 136  K 4.5 4.6 4.2  CL 102 100  101  CO2 30 26 25   GLUCOSE 144* 128* 110*  BUN 29* 27* 23  CREATININE 1.50* 1.32* 1.09*  CALCIUM 8.7* 8.2* 8.1*    I discussed in detail with patient's niece/healthcare power of attorney, updated care and answered all questions.  Time coordinating discharge: 45 minutes  SIGNED:  Vernell Leep, MD, FACP, [facility]. [facility]  To contact the attending provider between 7A-7P or the covering provider during after hours 7P-7A, please log into the web site [email or web address] and access using universal [place] password for that web site. If you do not have the password, please call the hospital operator.

## 2019-07-14 NOTE — TOC Transition Note (Signed)
Transition of Care [facility]) - CM/SW Discharge Note   Patient Details  Name: Miranda Ford MRN: [id number] Date of Birth: 1924-08-15  Transition of [facility]) CM/SW Contact:  Alberteen Sam, [place] Phone Number: [phone] 07/14/2019, 3:01 PM   Clinical Narrative:     Patient will DC to: [facility] Anticipated DC date: 07/14/2019 Family notified: Jeannene Patella [facility]) Transport HF:WYOV  Per MD patient ready for DC to [facility] . RN, patient, patient's family, and facility notified of DC. Discharge Summary sent to facility. RN given number for report [phone]  . DC packet on chart. Ambulance transport requested for patient.  CSW signing off.  [place], [place]   Final next level of care: Skilled Nursing Facility Barriers to Discharge: No Barriers Identified   Patient Goals and CMS Choice Patient states their goals for this hospitalization and ongoing recovery are:: to go to rehab then go home CMS Medicare.gov Compare Post Acute Care list provided to:: Patient Represenative (must comment)(POA [name]) Choice offered to / list presented to : [facility] POA / Guardian(POA [name])  Discharge Placement PASRR number recieved: 07/12/19            Patient [facility] bed at: [facility] Patient to be transferred to facility by: [place] Name of family member notified: Jeannene Patella [facility]) Patient and family notified of of transfer: 07/14/19  Discharge Plan and Services                                     Social Determinants of Health (SDOH) Interventions     Readmission Risk Interventions No flowsheet data found.

## 2019-07-14 NOTE — Progress Notes (Signed)
Nutrition Follow-up  DOCUMENTATION CODES:   Not applicable  INTERVENTION:  Continue Ensure Enlive po BID, each supplement provides 350 kcal and 20 grams of protein.  Encourage adequate PO intake.   NUTRITION DIAGNOSIS:   Increased nutrient needs related to chronic illness(CHF) as evidenced by estimated needs; ongoing  GOAL:   Patient will meet greater than or equal to 90% of their needs; met  MONITOR:   PO intake, Supplement acceptance, Skin, Weight trends, Labs, I & O's  REASON FOR ASSESSMENT:   Consult Assessment of nutrition requirement/status  ASSESSMENT:   85 y.o. female with medical history significant of bilateral cataracts, chronic systolic heart failure, history of epistaxis, macular degeneration, history of osteoporosis, history of paroxysmal atrial tachycardia on amiodarone, history of PE presents after fall at home. Imaging shows left humerus and left hip fracture. Per MD, non-operative treatment of left proximal humerus.  PROCEDURE (7/17): INTRAMEDULLARY (IM) NAIL INTERTROCHANTRIC (Left)   Meal completion has been 75-100%. Pt reports having a good appetite currently and PTA with no difficulties. Pt with no significant weight loss. Pt currently has Ensure ordered and has been consuming them. RD to continue with current orders to aid in post op healing. SNF placement pending.   Labs and medications reviewed.   NUTRITION - FOCUSED PHYSICAL EXAM:    Most Recent Value  Orbital Region  Unable to assess  Upper Arm Region  No depletion  Thoracic and Lumbar Region  No depletion  Buccal Region  Unable to assess  Temple Region  Unable to assess  Clavicle Bone Region  Mild depletion  Clavicle and Acromion Bone Region  Mild depletion  Scapular Bone Region  Unable to assess  Dorsal Hand  Moderate depletion  Patellar Region  No depletion  Anterior Thigh Region  No depletion  Posterior Calf Region  No depletion  Edema (RD Assessment)  None  Hair  Reviewed  Eyes   Reviewed  Mouth  Reviewed  Skin  Reviewed  Nails  Reviewed       Diet Order:   Diet Order            Diet regular Room service appropriate? Yes; Fluid consistency: Thin  Diet effective now              EDUCATION NEEDS:   Not appropriate for education at this time  Skin:  Skin Assessment: Skin Integrity Issues: Skin Integrity Issues:: Incisions Incisions: L leg  Last BM:  7/19  Height:   Ht Readings from Last 1 Encounters:  07/10/19 _0  (1.6 m)    Weight:   Wt Readings from Last 1 Encounters:  07/10/19 56.7 kg    Ideal Body Weight:  52.27 kg  BMI:  Body mass index is 22.14 kg/m.  Estimated Nutritional Needs:   Kcal:  1500-1650  Protein:  65-75 grams  Fluid:  >/= 1.5 L/day    Corrin Parker, MS, RD, LDN Pager # [phone] After hours/ weekend pager # [phone]

## 2019-07-14 NOTE — Progress Notes (Signed)
PROGRESS NOTE   EDUARDO WURTH  RJJ:[id number]    DOB: Aug 10, 1924    DOA: 07/10/2019  PCP: Sharilyn Sites, MD   I have briefly reviewed patients previous medical records in [facility].  Chief Complaint  Patient presents with  . Fall    Brief Narrative:  85 year old female, lives alone, fairly independent, uses a cane when she goes out of her house, PMH of NICM, chronic systolic CHF, PAT, pulmonary embolism on long-term apixaban per PCP presented to the ED on 07/10/2019 after a mechanical fall to her left side while she was gardening without her cane.  She was admitted for closed left intertrochanteric femur fracture and closed left distal humerus fracture.  S/p IM nail/ORIF of left hip fracture on 7/17.  Left humerus fracture for nonoperative management.  Gradually progressing.  Medically stable for DC to SNF pending bed availability and insurance approval.   Assessment & Plan:   Principal Problem:   Closed left hip fracture, initial encounter [facility]) Active Problems:   Paroxysmal atrial tachycardia (HCC)   Senile osteoporosis   Chronic systolic CHF (congestive heart failure) ([place])   History of pulmonary embolism   Closed fracture of left proximal humerus   Closed left intertrochanteric femur fracture  Sustained status post mechanical fall in the context of underlying osteoporosis.  Orthopedics consulted and patient underwent ORIF/IM nail on 7/17  As per orthopedic follow-up: WBAT LLE, Mepilex dressing PRN, keep dressing dry, Eliquis resumed and continue mobilization.  Was out of bed to chair for a couple of hours yesterday.  PT recommends SNF.  Pain control/minimize narcotic use.  Outpatient follow-up with Dr. Marcelino Scot in 1 to 2 weeks.  Appears medically stable for discharge to SNF pending bed availability and insurance approval.  Clinical social work was consulted over the weekend.  Acute left humerus fracture  As per orthopedic follow-up, nonoperative management.  NWB  LUE.  No lifting of left arm.  Sling for comfort.  Initiate early shoulder pendulum exercises and passive movement as pain allows.  No resistance exercise for 6 weeks.  Likely start active assisted motion in 4 weeks.  Stable without change.  Postop acute blood loss anemia  Secondary to fracture and recent surgery.  Hemoglobin had gradually dropped 12.8 preop >9.9 >8.5.  Hemoglobin stable compared to yesterday/8.3 today.  Asymptomatic.  Continue to trend daily CBCs and transfuse if hemoglobin 7 or less.  Periodically follow CBC at SNF.  Thrombocytopenia, mild  Likely related to acute blood loss.  No overt bleeding.  Improving, up from 120-135.  Acute kidney injury complicating stage III chronic kidney disease  Baseline creatinine in the 1.1 range.  Creatinine increased to 1.5 on 7/16.  Resolved after brief and gentle IV fluids.  NICM/chronic systolic CHF  Follows with Dr. Domenic Polite, Cardiology  Compensated.  On spironolactone 12.5 mg every other day.  Paroxysmal atrial tachycardia  Currently in sinus rhythm.  Continue low-dose amiodarone.  Although OP Cardiology follow-up from December 2019 indicates patient on low-dose Carvedilol, she does not appear to be on it.  History of pulmonary embolus  Remains on long-term Eliquis as per her PCP, continue.  COPD  Noted on chest x-ray.  Still on 3 L/min oxygen via nasal cannula and saturating in the mid 90s.    Aggressive incentive spirometry use.  Noted that wave pattern on pulse oximetry at bedside is not capturing well, discussed with RN at bedside to change pulse oximetry, wean off oxygen as tolerated.    Mild hypoxia  likely related to poor inspiratory effort/atelectasis and underlying COPD.  No clinical concerns for pneumonia, pulmonary edema or pulmonary embolism.  Osteoporosis  Continue calcium supplements.  Consider bisphosphonates.  Outpatient bone bone density check with PCP.  Constipation  Multifactorial due to  immobilization, poor oral intake, opioids.  Aggressive bowel regimen and monitor.  Had multiple BMs since yesterday.   DVT prophylaxis: Eliquis Code Status: DNR Family Communication: Discussed in detail with patient's niece/HCPOA on 7/19, updated care and answered questions. Disposition: Patient is medically stable for discharge to SNF pending bed availability and insurance approval.   Consultants:  Orthopedics  Procedures:  IM nail for left hip fracture on 7/17 Left shoulder sling for humerus fracture.  Antimicrobials:  None.   Subjective: States that she feels much better.  Had 3 BMs since yesterday and feels "unclogged".  Mild appropriate postop pain in left lower extremity controlled with Tylenol alone.  Careful moving left upper extremity with restrictions.  She would like to go to pain center in [place] if possible because her niece lives close by and can visit her as needed.  Interviewed and examined along with her RN at bedside.  Objective:  Vitals:   07/13/19 1430 07/13/19 1936 07/14/19 0353 07/14/19 0805  BP: 108/60 111/70 125/72 105/64  Pulse: 86 85 76 71  Resp: 14   14  Temp: 98.6 F (37 C) 98.7 F (37.1 C) 98.5 F (36.9 C) 98.2 F (36.8 C)  TempSrc: Oral Oral Axillary Oral  SpO2: 95% 100% 95% 98%  Weight:      Height:        Examination:  General exam: Pleasant elderly female, moderately built and frail lying comfortably supine in bed without distress. Respiratory system: Clear to auscultation.  No wheezing, rhonchi or crackles.  No increased work of breathing. Cardiovascular system: S1 and S2 heard, RRR.  No JVD, murmurs or pedal edema. Gastrointestinal system: Abdomen is nondistended, soft and nontender.  No organomegaly or masses appreciated.  Normal bowel sounds heard. Central nervous system: Alert and oriented. No focal neurological deficits. Extremities: Right limbs grade 5 x 5 power.  Left upper extremity in sling but normal movements and  strength of left fingers and wrist.  Left lower extremity power assessment limited secondary to postop pain but strength appears to be normal.  Left upper thigh postop dressing with old minimally soiling from Saturday which has not worsened.  No other acute findings noted. Skin: No rashes, lesions or ulcers Psychiatry: Judgement and insight appear normal. Mood & affect appropriate.     Data Reviewed: I have personally reviewed following labs and imaging studies  CBC: Recent Labs  Lab 07/10/19 2112 07/12/19 0411 07/13/19 0904 07/14/19 0414  WBC 9.0 11.9* 11.1* 10.7*  NEUTROABS 7.0  --   --   --   HGB 12.8 9.9* 8.5* 8.3*  HCT 39.7 31.4* 26.4* 25.5*  MCV 99.3 100.0 100.4* 98.5  PLT 166 127* 120* 702*   Basic Metabolic Panel: Recent Labs  Lab 07/10/19 2112 07/12/19 0728 07/13/19 0356  NA 138 134* 136  K 4.5 4.6 4.2  CL 102 100 101  CO2 30 26 25   GLUCOSE 144* 128* 110*  BUN 29* 27* 23  CREATININE 1.50* 1.32* 1.09*  CALCIUM 8.7* 8.2* 8.1*   Liver Function Tests: No results for input(s): AST, ALT, ALKPHOS, BILITOT, PROT, ALBUMIN in the last 168 hours. Coagulation Profile: Recent Labs  Lab 07/10/19 2112  INR 1.0     Recent Results (from the past  240 hour(s))  SARS Coronavirus 2 (CEPHEID - Performed in [place] hospital lab), Hosp Order     Status: None   Collection Time: 07/10/19 10:11 PM   Specimen: Nasopharyngeal Swab  Result Value Ref Range Status   SARS Coronavirus 2 NEGATIVE NEGATIVE Final    Comment: (NOTE) If result is NEGATIVE SARS-CoV-2 target nucleic acids are NOT DETECTED. The SARS-CoV-2 RNA is generally detectable in upper and lower  respiratory specimens during the acute phase of infection. The lowest  concentration of SARS-CoV-2 viral copies this assay can detect is 250  copies / mL. A negative result does not preclude SARS-CoV-2 infection  and should not be used as the sole basis for treatment or other  patient management decisions.  A negative  result may occur with  improper specimen collection / handling, submission of specimen other  than nasopharyngeal swab, presence of viral mutation(s) within the  areas targeted by this assay, and inadequate number of viral copies  (<250 copies / mL). A negative result must be combined with clinical  observations, patient history, and epidemiological information. If result is POSITIVE SARS-CoV-2 target nucleic acids are DETECTED. The SARS-CoV-2 RNA is generally detectable in upper and lower  respiratory specimens dur ing the acute phase of infection.  Positive  results are indicative of active infection with SARS-CoV-2.  Clinical  correlation with patient history and other diagnostic information is  necessary to determine patient infection status.  Positive results do  not rule out bacterial infection or co-infection with other viruses. If result is PRESUMPTIVE POSTIVE SARS-CoV-2 nucleic acids MAY BE PRESENT.   A presumptive positive result was obtained on the submitted specimen  and confirmed on repeat testing.  While 2019 novel coronavirus  (SARS-CoV-2) nucleic acids may be present in the submitted sample  additional confirmatory testing may be necessary for epidemiological  and / or clinical management purposes  to differentiate between  SARS-CoV-2 and other Sarbecovirus currently known to infect humans.  If clinically indicated additional testing with an alternate test  methodology [phone]) is advised. The SARS-CoV-2 RNA is generally  detectable in upper and lower respiratory sp ecimens during the acute  phase of infection. The expected result is Negative. Fact Sheet for Patients:  StrictlyIdeas.no Fact Sheet for Healthcare Providers: BankingDealers.co.za This test is not yet approved or cleared by the [place] FDA and has been authorized for detection and/or diagnosis of SARS-CoV-2 by FDA under an Emergency Use Authorization  (EUA).  This EUA will remain in effect (meaning this test can be used) for the duration of the COVID-19 declaration under Section 564(b)(1) of the Act, 21 U.S.C. section 360bbb-3(b)(1), unless the authorization is terminated or revoked sooner. Performed at [facility], [address] [place], [place] [address]   Surgical PCR screen     Status: None   Collection Time: 07/11/19  5:45 AM   Specimen: Nasal Mucosa; Nasal Swab  Result Value Ref Range Status   MRSA, PCR NEGATIVE NEGATIVE Final   Staphylococcus aureus NEGATIVE NEGATIVE Final    Comment: (NOTE) The Xpert SA Assay (FDA approved for NASAL specimens in patients 26 years of age and older), is one component of a comprehensive surveillance program. It is not intended to diagnose infection nor to guide or monitor treatment. Performed at [facility], [place] [address] [place], [place] [address]          Radiology Studies: No results found.      Scheduled Meds: . amiodarone  100 mg Oral  Daily  . apixaban  2.5 mg Oral BID  . calcium carbonate  1,250 mg Oral Q breakfast  . cholecalciferol  2,000 Units Oral BID  . docusate sodium  100 mg Oral BID  . feeding supplement (ENSURE ENLIVE)  237 mL Oral BID BM  . mupirocin ointment  1 application Nasal BID  . polyethylene glycol  17 g Oral BID  . senna-docusate  2 tablet Oral Daily  . spironolactone  12.5 mg Oral QODAY  . vitamin C  500 mg Oral Daily   Continuous Infusions:    LOS: 4 days     Vernell Leep, MD, FACP, [facility]. [facility]  To contact the attending provider between 7A-7P or the covering provider during after hours 7P-7A, please log into the web site [email or web address] and access using universal [place] password for that web site. If you do not have the password, please call the hospital operator.  07/14/2019, 10:01 AM

## 2019-07-14 NOTE — Plan of Care (Signed)
  Problem: Education: Goal: Knowledge of General Education information will improve Description: Including pain rating scale, medication(s)/side effects and non-pharmacologic comfort measures Outcome: Completed/Met   Problem: Health Behavior/Discharge Planning: Goal: Ability to manage health-related needs will improve Outcome: Completed/Met   Problem: Clinical Measurements: Goal: Ability to maintain clinical measurements within normal limits will improve Outcome: Completed/Met Goal: Will remain free from infection Outcome: Completed/Met Goal: Diagnostic test results will improve Outcome: Completed/Met Goal: Respiratory complications will improve Outcome: Completed/Met Goal: Cardiovascular complication will be avoided Outcome: Completed/Met   Problem: Activity: Goal: Risk for activity intolerance will decrease Outcome: Completed/Met   Problem: Nutrition: Goal: Adequate nutrition will be maintained Outcome: Completed/Met   Problem: Coping: Goal: Level of anxiety will decrease Outcome: Completed/Met   Problem: Elimination: Goal: Will not experience complications related to bowel motility Outcome: Completed/Met Goal: Will not experience complications related to urinary retention Outcome: Completed/Met   Problem: Pain Managment: Goal: General experience of comfort will improve Outcome: Completed/Met   Problem: Safety: Goal: Ability to remain free from injury will improve Outcome: Completed/Met   Problem: Skin Integrity: Goal: Risk for impaired skin integrity will decrease Outcome: Completed/Met   Problem: Increased Nutrient Needs (NI-5.1) Goal: Food and/or nutrient delivery Description: Individualized approach for food/nutrient provision. Outcome: Completed/Met   Problem: Acute Rehab OT Goals (only OT should resolve) Goal: Pt. Will Perform Grooming Outcome: Completed/Met Goal: Pt. Will Perform Upper Body Dressing Outcome: Completed/Met Goal: Pt. Will Perform  Lower Body Dressing Outcome: Completed/Met Goal: Pt. Will Transfer To Toilet Outcome: Completed/Met Goal: Pt. Will Perform Tub/Shower Transfer Outcome: Completed/Met   Problem: Acute Rehab OT Goals (only OT should resolve) Goal: OT Additional ADL Goal #1 Outcome: Completed/Met   Problem: Acute Rehab PT Goals(only PT should resolve) Goal: Pt Will Go Supine/Side To Sit Outcome: Completed/Met Goal: Patient Will Transfer Sit To/From Stand Outcome: Completed/Met Goal: Pt Will Transfer Bed To Chair/Chair To Bed Outcome: Completed/Met Goal: Pt Will Ambulate Outcome: Completed/Met

## 2019-07-14 NOTE — TOC Progression Note (Signed)
Transition of Care [facility]) - Progression Note    Patient Details  Name: Miranda Ford MRN: [id number] Date of Birth: 12-Oct-1924  [facility]) CM/SW [place], [place] Phone Number: 07/14/2019, 11:54 AM  Clinical Narrative:     CSW consulted with POA [name] regarding SNF choice, she asked CSW to inform patient of bed offers to choose. Patient reports preference for [facility]. CSW following up with [facility] to ensure bed availability today.  Expected Discharge Plan: Skilled Nursing Facility Barriers to Discharge: Continued Medical Work up, SNF Pending bed offer  Expected Discharge Plan and Services Expected Discharge Plan: [place]                                               Social Determinants of Health (SDOH) Interventions    Readmission Risk Interventions No flowsheet data found.

## 2019-07-14 NOTE — Progress Notes (Signed)
   07/14/19 1442  Medical Necessity for Transport Certificate --- IF THIS TRANSPORT IS ROUND TRIP OR SCHEDULED AND REPEATED, A PHYSICIAN MUST COMPLETE THIS FORM  Transport from: (Location) [facility]  Transport to (Location) Other (Comment) [facility])  Did the patient arrive from a [place] or [place]? No  Is this the closest appropriate facility? Yes  Date of Transport Service 07/14/19  Name of [place] [facility]  Round Trip Transport? No  Reason for Transport Discharge  Specific Services Available at 2nd Facility Other (Comment) (Rehab, 24 hour care)  Is this a hospice patient? No  Describe the Medical Condition fractured fip repair, humerus fracture  Q1 Are ALL the following "true"? 1. Patient unable to get up from bed without assistance  AND  2. Unable to ambulate  AND  3. Unable to sit in a chair, including wheelchair. Yes  Q2 Could the patient be transported safely by other means of transportation (I.E., wheelchair van)? No  Q3 Please check any of the following conditions that apply at the time of transport: Requires continuous oxygen;Requires immobilization of fracture or possible fracture  Electronic Signature Daylon Lafavor.  Credentials RN  Date Signed 07/14/19  Print Form Print

## 2019-07-14 NOTE — Progress Notes (Signed)
Orthopedic Trauma Service Progress Note  Patient ID: Miranda Ford MRN: [id number] DOB/AGE: 07/26/1924 85 y.o.  Subjective:  Doing well Pain well controlled with tylenol L shoulder and hip feel ok  Denies any other issues  [facility]  States niece is primary individual that assists with her care   ROS As above   Objective:   VITALS:   Vitals:   07/13/19 1430 07/13/19 1936 07/14/19 0353 07/14/19 0805  BP: 108/60 111/70 125/72 105/64  Pulse: 86 85 76 71  Resp: 14   14  Temp: 98.6 F (37 C) 98.7 F (37.1 C) 98.5 F (36.9 C) 98.2 F (36.8 C)  TempSrc: Oral Oral Axillary Oral  SpO2: 95% 100% 95% 98%  Weight:      Height:        Estimated body mass index is 22.14 kg/m as calculated from the following:   Height as of this encounter: 5\' 3"  (1.6 m).   Weight as of this encounter: 56.7 kg.   Intake/Output      07/19 0701 - 07/20 0700 07/20 0701 - 07/21 0700   P.O. 480 240   I.V. (mL/kg)     Total Intake(mL/kg) 480 (8.5) 240 (4.2)   Urine (mL/kg/hr)     Stool     Total Output     Net +480 +240        Urine Occurrence 5 x    Stool Occurrence 3 x      LABS  Results for orders placed or performed during the hospital encounter of 07/10/19 (from the past 24 hour(s))  CBC     Status: Abnormal   Collection Time: 07/14/19  4:14 AM  Result Value Ref Range   WBC 10.7 (H) 4.0 - 10.5 K/uL   RBC 2.59 (L) 3.87 - 5.11 MIL/uL   Hemoglobin 8.3 (L) 12.0 - 15.0 g/dL   HCT 25.5 (L) 36.0 - 46.0 %   MCV 98.5 80.0 - 100.0 fL   MCH 32.0 26.0 - 34.0 pg   MCHC 32.5 30.0 - 36.0 g/dL   RDW 14.5 11.5 - 15.5 %   Platelets 135 (L) 150 - 400 K/uL   nRBC 0.0 0.0 - 0.2 %     PHYSICAL EXAM:   Gen: awake and alert, sitting up in bed, NAD, appears well  Lungs: breathing unlabored Cardiac: regular, s1 and s2 Ext:       Left Upper Extremity   Sling in place   Ecchymosis to shoulder  noted, swelling stable  Ext warm   + radial pulse  Radial, ulnar, median, ax nv sensation intact  Radial,  Ulnar, median, AIN, PIN motor intact  Excellent active elbow, forearm, wrist and hand motion   Elbow, forearm, wrist and hand are nontender  No significant swelling distally        Left Lower extremity   Dressing intact   Scant drainage  Ext warm   Swelling controlled  + DP pulse  Motor and sensory functions intact  Good PROM of knee and hip   Excellent AROM of ankle    Assessment/Plan: 3 Days Post-Op   Principal Problem:   Closed left hip fracture, initial encounter ([place]) Active Problems:   Paroxysmal atrial tachycardia (HCC)   Senile osteoporosis   Chronic systolic CHF (congestive  heart failure) ([place])   History of pulmonary embolism   Closed fracture of left proximal humerus   Anti-infectives (From admission, onward)   Start     Dose/Rate Route Frequency Ordered Stop   07/11/19 1700  clindamycin (CLEOCIN) IVPB 600 mg     600 mg 100 mL/hr over 30 Minutes Intravenous Every 6 hours 07/11/19 1517 07/11/19 2339   07/11/19 1130  vancomycin (VANCOCIN) IVPB 1000 mg/200 mL premix     1,000 mg 200 mL/hr over 60 Minutes Intravenous  Once 07/11/19 1115 07/11/19 1219    .  POD/HD#: 66  85 year old right-hand-dominant female status post fall with left intertrochanteric hip fracture and left proximal humerus fracture.  Baseline osteoporosis currently not on any pharmacologic treatment.  History estrogens and bisphosphonate   -fall   -Displaced left intertrochanteric hip fracture s/p IMN    WBAT  ROM as tolerated  PT/OT  Dressing changes as needed   Ok to leave dressings off once dry    Clean wounds with soap and water only    Ice as needed for pain and swelling   Elevate extremity for swelling  TED for swelling if pt wishes   -Impacted left proximal humerus fracture, non-op treatment              very good alignment on shoulder series imaging                Nonweightbearing left upper extremity.  No lifting with left arm             Sling for comfort and when up mobilizing              start shoulder pendulum exercises                        gentle passive shoulder flexion and extension at this time   Unrestricted AROM and PROM of elbow, forearm, wrist and digits               No resistance exercises until about 6 weeks post injury             We will start active and active assisted motion around the 4-week mark               Ice as needed for swelling and pain control                - Pain management:             tylenol appears effective    - ABL anemia/Hemodynamics                          Monitor  asymptomatic    - Medical issues              Per medical service   - DVT/PE prophylaxis:             apixaban restarted- chronic medication for history of PE    - ID:              Perioperative antibiotics completed    - Metabolic Bone Disease:             Baseline osteoporosis                         Check vitamin D---> pending  Patient would likely benefit from repeat bone density scan                         Fractures are indicative of osteoporosis                         Continue with weightbearing exercises                           Could consider agent such as Prolia or evenity    - Activity:             PT and OT to  - FEN/GI prophylaxis/Foley/Lines:             reg diet             RD eval    - Impediments to fracture healing:             Baseline osteoporosis             Restricted mobility             Age             Chronic medical conditions   - Dispo:           continue with therapies  Await for insurance approval for SNF      Weightbearing: WBAT L LEx, NWB L Upper Extremity  Insicional and dressing care: OK to remove dressings as of now and leave open to air with dry gauze PRN Orthopedic device(s): sling, walker or other suitable assistive device Showering: ok to shower  and clean wound with soap and water VTE prophylaxis: on chronic apixaban  .  Pain control: tylenol  Follow - up plan: 2-3 weeks Contact information:  Altamese Borden MD, Ainsley Spinner, PA-C    Jari Pigg, PA-C [phone] (C) 07/14/2019, 9:38 AM  [facility] [place] [address] [phone] (F)

## 2019-07-15 DIAGNOSIS — I480 Paroxysmal atrial fibrillation: Secondary | ICD-10-CM | POA: Diagnosis not present

## 2019-07-15 DIAGNOSIS — S7292XD Unspecified fracture of left femur, subsequent encounter for closed fracture with routine healing: Secondary | ICD-10-CM | POA: Diagnosis not present

## 2019-07-15 DIAGNOSIS — I5022 Chronic systolic (congestive) heart failure: Secondary | ICD-10-CM | POA: Diagnosis not present

## 2019-07-15 DIAGNOSIS — E785 Hyperlipidemia, unspecified: Secondary | ICD-10-CM | POA: Diagnosis not present

## 2019-07-17 DIAGNOSIS — S7292XD Unspecified fracture of left femur, subsequent encounter for closed fracture with routine healing: Secondary | ICD-10-CM | POA: Diagnosis not present

## 2019-07-17 DIAGNOSIS — I480 Paroxysmal atrial fibrillation: Secondary | ICD-10-CM | POA: Diagnosis not present

## 2019-07-17 DIAGNOSIS — I5022 Chronic systolic (congestive) heart failure: Secondary | ICD-10-CM | POA: Diagnosis not present

## 2019-07-17 DIAGNOSIS — M810 Age-related osteoporosis without current pathological fracture: Secondary | ICD-10-CM | POA: Diagnosis not present

## 2019-07-22 DIAGNOSIS — I5022 Chronic systolic (congestive) heart failure: Secondary | ICD-10-CM | POA: Diagnosis not present

## 2019-07-22 DIAGNOSIS — M810 Age-related osteoporosis without current pathological fracture: Secondary | ICD-10-CM | POA: Diagnosis not present

## 2019-07-22 DIAGNOSIS — I480 Paroxysmal atrial fibrillation: Secondary | ICD-10-CM | POA: Diagnosis not present

## 2019-07-22 DIAGNOSIS — S7292XD Unspecified fracture of left femur, subsequent encounter for closed fracture with routine healing: Secondary | ICD-10-CM | POA: Diagnosis not present

## 2019-07-26 DIAGNOSIS — S42302D Unspecified fracture of shaft of humerus, left arm, subsequent encounter for fracture with routine healing: Secondary | ICD-10-CM | POA: Diagnosis not present

## 2019-07-26 DIAGNOSIS — H3530 Unspecified macular degeneration: Secondary | ICD-10-CM | POA: Diagnosis not present

## 2019-07-26 DIAGNOSIS — S72142D Displaced intertrochanteric fracture of left femur, subsequent encounter for closed fracture with routine healing: Secondary | ICD-10-CM | POA: Diagnosis not present

## 2019-07-26 DIAGNOSIS — D62 Acute posthemorrhagic anemia: Secondary | ICD-10-CM | POA: Diagnosis not present

## 2019-07-26 DIAGNOSIS — M6281 Muscle weakness (generalized): Secondary | ICD-10-CM | POA: Diagnosis not present

## 2019-07-26 DIAGNOSIS — R262 Difficulty in walking, not elsewhere classified: Secondary | ICD-10-CM | POA: Diagnosis not present

## 2019-07-26 DIAGNOSIS — Z86711 Personal history of pulmonary embolism: Secondary | ICD-10-CM | POA: Diagnosis not present

## 2019-07-26 DIAGNOSIS — N183 Chronic kidney disease, stage 3 (moderate): Secondary | ICD-10-CM | POA: Diagnosis not present

## 2019-07-26 DIAGNOSIS — J449 Chronic obstructive pulmonary disease, unspecified: Secondary | ICD-10-CM | POA: Diagnosis not present

## 2019-07-26 DIAGNOSIS — H25813 Combined forms of age-related cataract, bilateral: Secondary | ICD-10-CM | POA: Diagnosis not present

## 2019-07-26 DIAGNOSIS — T8140XA Infection following a procedure, unspecified, initial encounter: Secondary | ICD-10-CM | POA: Diagnosis not present

## 2019-07-26 DIAGNOSIS — Z4789 Encounter for other orthopedic aftercare: Secondary | ICD-10-CM | POA: Diagnosis not present

## 2019-07-26 DIAGNOSIS — K5900 Constipation, unspecified: Secondary | ICD-10-CM | POA: Diagnosis not present

## 2019-07-26 DIAGNOSIS — R040 Epistaxis: Secondary | ICD-10-CM | POA: Diagnosis not present

## 2019-07-26 DIAGNOSIS — N179 Acute kidney failure, unspecified: Secondary | ICD-10-CM | POA: Diagnosis not present

## 2019-07-26 DIAGNOSIS — D696 Thrombocytopenia, unspecified: Secondary | ICD-10-CM | POA: Diagnosis not present

## 2019-07-26 DIAGNOSIS — E785 Hyperlipidemia, unspecified: Secondary | ICD-10-CM | POA: Diagnosis not present

## 2019-07-26 DIAGNOSIS — S7292XD Unspecified fracture of left femur, subsequent encounter for closed fracture with routine healing: Secondary | ICD-10-CM | POA: Diagnosis not present

## 2019-07-26 DIAGNOSIS — M810 Age-related osteoporosis without current pathological fracture: Secondary | ICD-10-CM | POA: Diagnosis not present

## 2019-07-26 DIAGNOSIS — I5022 Chronic systolic (congestive) heart failure: Secondary | ICD-10-CM | POA: Diagnosis not present

## 2019-07-26 DIAGNOSIS — I10 Essential (primary) hypertension: Secondary | ICD-10-CM | POA: Diagnosis not present

## 2019-07-26 DIAGNOSIS — I480 Paroxysmal atrial fibrillation: Secondary | ICD-10-CM | POA: Diagnosis not present

## 2019-07-29 DIAGNOSIS — S7292XD Unspecified fracture of left femur, subsequent encounter for closed fracture with routine healing: Secondary | ICD-10-CM | POA: Diagnosis not present

## 2019-07-29 DIAGNOSIS — I480 Paroxysmal atrial fibrillation: Secondary | ICD-10-CM | POA: Diagnosis not present

## 2019-07-29 DIAGNOSIS — I5022 Chronic systolic (congestive) heart failure: Secondary | ICD-10-CM | POA: Diagnosis not present

## 2019-07-29 DIAGNOSIS — D696 Thrombocytopenia, unspecified: Secondary | ICD-10-CM | POA: Diagnosis not present

## 2019-07-31 ENCOUNTER — Other Ambulatory Visit: Payer: Self-pay | Admitting: *Deleted

## 2019-07-31 NOTE — Patient Outreach (Signed)
Member assessed for potential [facility] Care Management needs as a benefit of  [place] [employer].  Member is currently receiving rehab therapy at [facility].  Member discussed in weekly telephonic IDT meeting with facility staff, [facility] UM RN, and writer.  Facility discharge planner reports that member will remain at facility under private pay after skilled stay pay before returning home.   Will continue to follow for potential [facility] Care Management needs. Will plan to sign off when member remains under private pay.  Will continue to collaborate with [facility] UM team and facility on member.   Marthenia Rolling, MSN-Ed, RN,BSN [place] Acute Care Coordinator [phone] [facility] [place]) [phone]  (Toll free office)

## 2019-08-06 DIAGNOSIS — S7292XD Unspecified fracture of left femur, subsequent encounter for closed fracture with routine healing: Secondary | ICD-10-CM | POA: Diagnosis not present

## 2019-08-06 DIAGNOSIS — M810 Age-related osteoporosis without current pathological fracture: Secondary | ICD-10-CM | POA: Diagnosis not present

## 2019-08-06 DIAGNOSIS — S42302D Unspecified fracture of shaft of humerus, left arm, subsequent encounter for fracture with routine healing: Secondary | ICD-10-CM | POA: Diagnosis not present

## 2019-08-06 DIAGNOSIS — I480 Paroxysmal atrial fibrillation: Secondary | ICD-10-CM | POA: Diagnosis not present

## 2019-08-07 ENCOUNTER — Other Ambulatory Visit: Payer: Self-pay | Admitting: *Deleted

## 2019-08-07 DIAGNOSIS — I5022 Chronic systolic (congestive) heart failure: Secondary | ICD-10-CM

## 2019-08-07 NOTE — Patient Outreach (Signed)
Member assessed for potential [facility] Care Management needs as a benefit of  [place] [employer].  Member has been at [facility] receiving rehab therapy.  Member discussed in weekly telephonic IDT meeting with facility staff, [facility] UM team, and writer.  Facility discharge planner reports that Mrs. Clinkenbeard is slated for discharge to home today. Mrs. Sonier lived alone prior to admission. Facility states she will have caregivers thru [place] and [place] thru [place]. Facility reports that member changed her mind and elected to go home rather than stay at facility under private pay.  Mrs. Crewe has a medical history of CHF, paroxysmal atrial tachycardia, osteoporosis, PE.   Attempted to reach Mrs. Molinaro at [phone]. No answer. No voicemail message heard.  Will make referral to [facility] for complex case management.    Marthenia Rolling, MSN-Ed, RN,BSN [place] Acute Care Coordinator [phone] [facility]) [phone]  (Toll free office)

## 2019-08-08 ENCOUNTER — Other Ambulatory Visit: Payer: Self-pay

## 2019-08-08 DIAGNOSIS — J449 Chronic obstructive pulmonary disease, unspecified: Secondary | ICD-10-CM | POA: Diagnosis not present

## 2019-08-08 DIAGNOSIS — I5022 Chronic systolic (congestive) heart failure: Secondary | ICD-10-CM | POA: Diagnosis not present

## 2019-08-08 DIAGNOSIS — Z7901 Long term (current) use of anticoagulants: Secondary | ICD-10-CM | POA: Diagnosis not present

## 2019-08-08 DIAGNOSIS — S42302D Unspecified fracture of shaft of humerus, left arm, subsequent encounter for fracture with routine healing: Secondary | ICD-10-CM | POA: Diagnosis not present

## 2019-08-08 DIAGNOSIS — S7292XD Unspecified fracture of left femur, subsequent encounter for closed fracture with routine healing: Secondary | ICD-10-CM | POA: Diagnosis not present

## 2019-08-08 DIAGNOSIS — I480 Paroxysmal atrial fibrillation: Secondary | ICD-10-CM | POA: Diagnosis not present

## 2019-08-08 DIAGNOSIS — M6281 Muscle weakness (generalized): Secondary | ICD-10-CM | POA: Diagnosis not present

## 2019-08-08 NOTE — Patient Outreach (Signed)
[facility]) Care Management  08/08/2019  NISHITA ISAACKS 08/21/1924 [id number]   Brief outreach with Ms. Estorga. HIPAA identifiers verified. She denies urgent concerns and reports feeling well today. She was preparing to receive assistance from her sitter at the time of the call. Confirmed start of home health services with [facility]. She will not be available later today but anticipates being available on next Tuesday. She prefers to be contacted in the afternoon.  PLAN -Will complete telephonic assessment on 08/12/19 at [place] [phone]

## 2019-08-12 ENCOUNTER — Other Ambulatory Visit: Payer: Self-pay

## 2019-08-12 DIAGNOSIS — J449 Chronic obstructive pulmonary disease, unspecified: Secondary | ICD-10-CM | POA: Diagnosis not present

## 2019-08-12 DIAGNOSIS — S42302D Unspecified fracture of shaft of humerus, left arm, subsequent encounter for fracture with routine healing: Secondary | ICD-10-CM | POA: Diagnosis not present

## 2019-08-12 DIAGNOSIS — M6281 Muscle weakness (generalized): Secondary | ICD-10-CM | POA: Diagnosis not present

## 2019-08-12 DIAGNOSIS — I5022 Chronic systolic (congestive) heart failure: Secondary | ICD-10-CM | POA: Diagnosis not present

## 2019-08-12 DIAGNOSIS — S7292XD Unspecified fracture of left femur, subsequent encounter for closed fracture with routine healing: Secondary | ICD-10-CM | POA: Diagnosis not present

## 2019-08-12 DIAGNOSIS — I480 Paroxysmal atrial fibrillation: Secondary | ICD-10-CM | POA: Diagnosis not present

## 2019-08-12 NOTE — Patient Outreach (Signed)
[facility]) Care Management  [place]  08/12/2019   AERIS HERSMAN 09-17-1924 [id number]  Subjective:  Outreach with Ms. Laverdiere for completion of initial telephonic assessment. Per chart review, "She was admitted for closed left intertrochanteric femur fracture and closed left distal humerus fracture. S/P IM nail/ORIF of left fracture on 7/17. Left humerus fracture for nonoperative management." She was admitted to [facility] following hospital discharge. She discharged from [facility] on 08/07/19.  Objective:  Encounter Medications:  Outpatient Encounter Medications as of 08/12/2019  Medication Sig  . acetaminophen (TYLENOL) 325 MG tablet Take 1-2 tablets (325-650 mg total) by mouth every 6 (six) hours as needed for mild pain (pain score 1-3 or temp > 100.5).  Marland Kitchen amiodarone (PACERONE) 200 MG tablet TAKE (1/2) TABLET BY MOUTH ONCE DAILY. (Patient taking differently: Take 100 mg by mouth daily. )  . atorvastatin (LIPITOR) 40 MG tablet Take 1 tablet (40 mg total) by mouth at bedtime.  . bisacodyl (DULCOLAX) 10 MG suppository Place 1 suppository (10 mg total) rectally daily as needed for moderate constipation.  . calcium carbonate (OSCAL) 1500 (600 Ca) MG TABS tablet Take 1,500 mg by mouth daily.   . carvedilol (COREG) 3.125 MG tablet TAKE 1 TABLET BY MOUTH TWICE DAILY WITH A MEAL. (Patient taking differently: Take 3.125 mg by mouth 2 (two) times daily with a meal. )  . cholecalciferol (VITAMIN D) 25 MCG tablet Take 2 tablets (2,000 Units total) by mouth 2 (two) times daily.  Marland Kitchen docusate sodium (COLACE) 100 MG capsule Take 1 capsule (100 mg total) by mouth 2 (two) times daily.  Marland Kitchen ELIQUIS 2.5 MG TABS tablet TAKE 1 TABLET BY MOUTH AT 10AM AND 1 TABLET AT 9PM. (Patient taking differently: Take 2.5 mg by mouth 2 (two) times daily. )  . feeding supplement, ENSURE ENLIVE, (ENSURE ENLIVE) LIQD Take 237 mLs by mouth 2 (two) times daily between meals.  . Multiple  Vitamins-Minerals (ICAPS PO) Take 1 capsule by mouth daily.   . polyethylene glycol (MIRALAX / GLYCOLAX) 17 g packet Take 17 g by mouth 2 (two) times daily.  Marland Kitchen spironolactone (ALDACTONE) 25 MG tablet Take 0.5 tablets (12.5 mg total) by mouth every other day.  . vitamin C (VITAMIN C) 500 MG tablet Take 1 tablet (500 mg total) by mouth daily.   No facility-administered encounter medications on file as of 08/12/2019.     Functional Status:  In your present state of health, do you have any difficulty performing the following activities: 08/12/2019 07/11/2019  Hearing? N N  Vision? Y N  Difficulty concentrating or making decisions? N N  Walking or climbing stairs? Y N  Dressing or bathing? Y N  Doing errands, shopping? Y N  Preparing Food and eating ? N -  Using the Toilet? Y -  Comment Due to recent fall. -  In the past six months, have you accidently leaked urine? N -  Do you have problems with loss of bowel control? N -  Managing your Medications? N -  Managing your Finances? N -  Housekeeping or managing your Housekeeping? Y -  Comment Due to recent fall. -  Some recent data might be hidden    Fall/Depression Screening: Fall Risk  08/12/2019  Falls in the past year? 1  Number falls in past yr: 1  Injury with Fall? 1  Risk for fall due to : History of fall(s);Impaired balance/gait;Medication side effect  Follow up Falls prevention discussed   PHQ 2/9  Scores 08/12/2019  PHQ - 2 Score 0    Assessment:  Successful outreach with Ms. Homes. Telephonic assessment complete. She reports feeling well today. Denies complaints of shortness of breath or chest discomfort. Denies complaints of pain. She is receiving physical therapy, occupational therapy and skilled nursing services with [facility]. Also reports receiving nursing assistant services with [facility]. Denies falls since discharge.  She reports compliance with prescribed medications. Reports using a pillbox and self-managing her  medications. She denies concerns regarding adherence or need for compliance packages. Denies concerns regarding prescription costs.  Discussed care management needs. She is currently wheelchair bound and requires assistance with ADLs. States the nursing assistants rotate shifts. They are usually in the home around the clock. Reports that her niece Jeannene Patella is also available to assist between shifts if needed. Reports having a very good support system and declines need for nutritional or transportation assistance. Declines current need for community resource referrals.  THN CM Care Plan Problem One     Most Recent Value  Care Plan Problem One  Risk for Readmission  Role Documenting the Problem One  Care Management [place] for Problem One  Active  THN Long Term Goal   Over the next 90 days, patient will not be hospitalized.  THN Long Term Goal Start Date  08/12/19  Interventions for Problem One Long Term Goal  Discussed medications, provider recommendations, nutrition and tolerance with home health therapy. Provided education regarding home safety and fall prevention.   THN CM Short Term Goal #1   Over the next 30 days, patient will take all medications as prescribed.  Interventions for Short Term Goal #1  Discussed medications and compliance with prescribed regimen. Encouraged to take as prescribed and notify MD with concerns regarding new or worsening side effects. [Denies concerns regarding prescription costs.]  THN CM Short Term Goal #2   Over the next 30 days, patient will attend all appointments as scheduled.  THN CM Short Term Goal #2 Start Date  08/12/19  Interventions for Short Term Goal #2  Reviewed pending appointments and discussed availability of transportation. Patient encouraged to attend appointments as scheduled to prevent delays in care. [Declines need for transporation assistance.]    THN CM Care Plan Problem Two     Most Recent Value  Care Plan Problem Two  High Risks  for Falls  Role Documenting the Problem Two  Care Management Coordinator  Care Plan for Problem Two  Active  Interventions for Problem Two Long Term Goal   Discussed provider recommendations regarding weight bearing. Provided education regarding home safety and fall prevention measures.  THN Long Term Goal  Over the next 60 days, patient will not experience falls.  THN Long Term Goal Start Date  08/12/19  THN CM Short Term Goal #1   Over the next 30 days patient will comply with home health therapy recommendations.  THN CM Short Term Goal #1 Start Date  08/12/19  Interventions for Short Term Goal #2   Reviewed safety measures. Encouraged to use recommended techniques when transferring in and out of the wheelchair.   THN CM Short Term Goal #2   Over the next 30 days, patient will use assistive device and request assistance prior to standing.  THN CM Short Term Goal #2 Start Date  08/12/19  Interventions for Short Term Goal #2  Reviewed recommendations regarding non weight bearing. Per MD, patient is to stand only if required when transferring. Patient strongly encouraged to have  walker available to use as extra support. Encouraged to request assistance prior to standing or transferring.      PLAN -Will follow-up next week.   [place] Care Management [phone]

## 2019-08-13 DIAGNOSIS — S72001D Fracture of unspecified part of neck of right femur, subsequent encounter for closed fracture with routine healing: Secondary | ICD-10-CM | POA: Diagnosis not present

## 2019-08-13 DIAGNOSIS — H6120 Impacted cerumen, unspecified ear: Secondary | ICD-10-CM | POA: Diagnosis not present

## 2019-08-13 DIAGNOSIS — Z6822 Body mass index (BMI) 22.0-22.9, adult: Secondary | ICD-10-CM | POA: Diagnosis not present

## 2019-08-14 DIAGNOSIS — J449 Chronic obstructive pulmonary disease, unspecified: Secondary | ICD-10-CM | POA: Diagnosis not present

## 2019-08-14 DIAGNOSIS — I5022 Chronic systolic (congestive) heart failure: Secondary | ICD-10-CM | POA: Diagnosis not present

## 2019-08-14 DIAGNOSIS — S7292XD Unspecified fracture of left femur, subsequent encounter for closed fracture with routine healing: Secondary | ICD-10-CM | POA: Diagnosis not present

## 2019-08-14 DIAGNOSIS — M6281 Muscle weakness (generalized): Secondary | ICD-10-CM | POA: Diagnosis not present

## 2019-08-14 DIAGNOSIS — I480 Paroxysmal atrial fibrillation: Secondary | ICD-10-CM | POA: Diagnosis not present

## 2019-08-14 DIAGNOSIS — S42302D Unspecified fracture of shaft of humerus, left arm, subsequent encounter for fracture with routine healing: Secondary | ICD-10-CM | POA: Diagnosis not present

## 2019-08-18 DIAGNOSIS — M6281 Muscle weakness (generalized): Secondary | ICD-10-CM | POA: Diagnosis not present

## 2019-08-18 DIAGNOSIS — I5022 Chronic systolic (congestive) heart failure: Secondary | ICD-10-CM | POA: Diagnosis not present

## 2019-08-18 DIAGNOSIS — I480 Paroxysmal atrial fibrillation: Secondary | ICD-10-CM | POA: Diagnosis not present

## 2019-08-18 DIAGNOSIS — S42302D Unspecified fracture of shaft of humerus, left arm, subsequent encounter for fracture with routine healing: Secondary | ICD-10-CM | POA: Diagnosis not present

## 2019-08-18 DIAGNOSIS — S7292XD Unspecified fracture of left femur, subsequent encounter for closed fracture with routine healing: Secondary | ICD-10-CM | POA: Diagnosis not present

## 2019-08-18 DIAGNOSIS — J449 Chronic obstructive pulmonary disease, unspecified: Secondary | ICD-10-CM | POA: Diagnosis not present

## 2019-08-19 DIAGNOSIS — I480 Paroxysmal atrial fibrillation: Secondary | ICD-10-CM | POA: Diagnosis not present

## 2019-08-19 DIAGNOSIS — I5022 Chronic systolic (congestive) heart failure: Secondary | ICD-10-CM | POA: Diagnosis not present

## 2019-08-19 DIAGNOSIS — S7292XD Unspecified fracture of left femur, subsequent encounter for closed fracture with routine healing: Secondary | ICD-10-CM | POA: Diagnosis not present

## 2019-08-19 DIAGNOSIS — J449 Chronic obstructive pulmonary disease, unspecified: Secondary | ICD-10-CM | POA: Diagnosis not present

## 2019-08-19 DIAGNOSIS — S42302D Unspecified fracture of shaft of humerus, left arm, subsequent encounter for fracture with routine healing: Secondary | ICD-10-CM | POA: Diagnosis not present

## 2019-08-19 DIAGNOSIS — M6281 Muscle weakness (generalized): Secondary | ICD-10-CM | POA: Diagnosis not present

## 2019-08-20 ENCOUNTER — Other Ambulatory Visit: Payer: Self-pay

## 2019-08-20 DIAGNOSIS — M6281 Muscle weakness (generalized): Secondary | ICD-10-CM | POA: Diagnosis not present

## 2019-08-20 DIAGNOSIS — S42302D Unspecified fracture of shaft of humerus, left arm, subsequent encounter for fracture with routine healing: Secondary | ICD-10-CM | POA: Diagnosis not present

## 2019-08-20 DIAGNOSIS — S7292XD Unspecified fracture of left femur, subsequent encounter for closed fracture with routine healing: Secondary | ICD-10-CM | POA: Diagnosis not present

## 2019-08-20 DIAGNOSIS — I480 Paroxysmal atrial fibrillation: Secondary | ICD-10-CM | POA: Diagnosis not present

## 2019-08-20 DIAGNOSIS — J449 Chronic obstructive pulmonary disease, unspecified: Secondary | ICD-10-CM | POA: Diagnosis not present

## 2019-08-20 DIAGNOSIS — I5022 Chronic systolic (congestive) heart failure: Secondary | ICD-10-CM | POA: Diagnosis not present

## 2019-08-20 NOTE — Patient Outreach (Signed)
[place] [facility]) Care Management  08/20/2019  PRANSHI VANHOUTEN 01-05-1924 [id number]   Successful outreach with Ms. Yera. She reports feeling well today. Denies changes or worsening symptoms within the last week. Reports slowly increasing activities with assistance from her home health Physical Therapist. She will complete a PT session later today. No urgent concerns. Agreed to contact RNCM if needed prior to the next weekly outreach.  PLAN -Will follow-up next week.  [place] Care Management [phone]

## 2019-08-21 DIAGNOSIS — M6281 Muscle weakness (generalized): Secondary | ICD-10-CM | POA: Diagnosis not present

## 2019-08-21 DIAGNOSIS — S42302D Unspecified fracture of shaft of humerus, left arm, subsequent encounter for fracture with routine healing: Secondary | ICD-10-CM | POA: Diagnosis not present

## 2019-08-21 DIAGNOSIS — S7292XD Unspecified fracture of left femur, subsequent encounter for closed fracture with routine healing: Secondary | ICD-10-CM | POA: Diagnosis not present

## 2019-08-21 DIAGNOSIS — I480 Paroxysmal atrial fibrillation: Secondary | ICD-10-CM | POA: Diagnosis not present

## 2019-08-21 DIAGNOSIS — J449 Chronic obstructive pulmonary disease, unspecified: Secondary | ICD-10-CM | POA: Diagnosis not present

## 2019-08-21 DIAGNOSIS — I5022 Chronic systolic (congestive) heart failure: Secondary | ICD-10-CM | POA: Diagnosis not present

## 2019-08-26 DIAGNOSIS — I5022 Chronic systolic (congestive) heart failure: Secondary | ICD-10-CM | POA: Diagnosis not present

## 2019-08-26 DIAGNOSIS — S7292XD Unspecified fracture of left femur, subsequent encounter for closed fracture with routine healing: Secondary | ICD-10-CM | POA: Diagnosis not present

## 2019-08-26 DIAGNOSIS — J449 Chronic obstructive pulmonary disease, unspecified: Secondary | ICD-10-CM | POA: Diagnosis not present

## 2019-08-26 DIAGNOSIS — M6281 Muscle weakness (generalized): Secondary | ICD-10-CM | POA: Diagnosis not present

## 2019-08-26 DIAGNOSIS — I480 Paroxysmal atrial fibrillation: Secondary | ICD-10-CM | POA: Diagnosis not present

## 2019-08-26 DIAGNOSIS — S42302D Unspecified fracture of shaft of humerus, left arm, subsequent encounter for fracture with routine healing: Secondary | ICD-10-CM | POA: Diagnosis not present

## 2019-08-27 DIAGNOSIS — S72142D Displaced intertrochanteric fracture of left femur, subsequent encounter for closed fracture with routine healing: Secondary | ICD-10-CM | POA: Diagnosis not present

## 2019-08-27 DIAGNOSIS — S42215D Unspecified nondisplaced fracture of surgical neck of left humerus, subsequent encounter for fracture with routine healing: Secondary | ICD-10-CM | POA: Diagnosis not present

## 2019-08-28 ENCOUNTER — Other Ambulatory Visit: Payer: Self-pay

## 2019-08-28 DIAGNOSIS — S42302D Unspecified fracture of shaft of humerus, left arm, subsequent encounter for fracture with routine healing: Secondary | ICD-10-CM | POA: Diagnosis not present

## 2019-08-28 DIAGNOSIS — I480 Paroxysmal atrial fibrillation: Secondary | ICD-10-CM | POA: Diagnosis not present

## 2019-08-28 DIAGNOSIS — J449 Chronic obstructive pulmonary disease, unspecified: Secondary | ICD-10-CM | POA: Diagnosis not present

## 2019-08-28 DIAGNOSIS — I5022 Chronic systolic (congestive) heart failure: Secondary | ICD-10-CM | POA: Diagnosis not present

## 2019-08-28 DIAGNOSIS — M6281 Muscle weakness (generalized): Secondary | ICD-10-CM | POA: Diagnosis not present

## 2019-08-28 DIAGNOSIS — S7292XD Unspecified fracture of left femur, subsequent encounter for closed fracture with routine healing: Secondary | ICD-10-CM | POA: Diagnosis not present

## 2019-08-28 NOTE — Patient Outreach (Signed)
[place] [facility]) Care Management  08/28/2019  TIAIRRA Ford 08-20-1924 [id number]    Follow-up outreach with Miranda Ford. She reports feeling very good today. Denies complaints of pain. She remains afebrile. She completed her Ortho follow-up with Dr. Marcelino Scot earlier this week. Miranda Ford remains compliant with medications and treatment recommendations. Reports that she is progressing well. She continues to receive in-home personal care assistance with [facility]. Denies urgent concerns or need for immediate assistance.  PLAN -Will follow-up next week.  [place] Care Management [phone]

## 2019-08-29 DIAGNOSIS — S7292XD Unspecified fracture of left femur, subsequent encounter for closed fracture with routine healing: Secondary | ICD-10-CM | POA: Diagnosis not present

## 2019-08-29 DIAGNOSIS — M6281 Muscle weakness (generalized): Secondary | ICD-10-CM | POA: Diagnosis not present

## 2019-08-29 DIAGNOSIS — J449 Chronic obstructive pulmonary disease, unspecified: Secondary | ICD-10-CM | POA: Diagnosis not present

## 2019-08-29 DIAGNOSIS — I5022 Chronic systolic (congestive) heart failure: Secondary | ICD-10-CM | POA: Diagnosis not present

## 2019-08-29 DIAGNOSIS — I480 Paroxysmal atrial fibrillation: Secondary | ICD-10-CM | POA: Diagnosis not present

## 2019-08-29 DIAGNOSIS — S42302D Unspecified fracture of shaft of humerus, left arm, subsequent encounter for fracture with routine healing: Secondary | ICD-10-CM | POA: Diagnosis not present

## 2019-09-02 DIAGNOSIS — I5022 Chronic systolic (congestive) heart failure: Secondary | ICD-10-CM | POA: Diagnosis not present

## 2019-09-02 DIAGNOSIS — S7292XD Unspecified fracture of left femur, subsequent encounter for closed fracture with routine healing: Secondary | ICD-10-CM | POA: Diagnosis not present

## 2019-09-02 DIAGNOSIS — M6281 Muscle weakness (generalized): Secondary | ICD-10-CM | POA: Diagnosis not present

## 2019-09-02 DIAGNOSIS — S42302D Unspecified fracture of shaft of humerus, left arm, subsequent encounter for fracture with routine healing: Secondary | ICD-10-CM | POA: Diagnosis not present

## 2019-09-02 DIAGNOSIS — J449 Chronic obstructive pulmonary disease, unspecified: Secondary | ICD-10-CM | POA: Diagnosis not present

## 2019-09-02 DIAGNOSIS — I480 Paroxysmal atrial fibrillation: Secondary | ICD-10-CM | POA: Diagnosis not present

## 2019-09-03 ENCOUNTER — Ambulatory Visit: Payer: Medicare Other | Admitting: Cardiology

## 2019-09-03 DIAGNOSIS — S7292XD Unspecified fracture of left femur, subsequent encounter for closed fracture with routine healing: Secondary | ICD-10-CM | POA: Diagnosis not present

## 2019-09-03 DIAGNOSIS — I5022 Chronic systolic (congestive) heart failure: Secondary | ICD-10-CM | POA: Diagnosis not present

## 2019-09-03 DIAGNOSIS — I480 Paroxysmal atrial fibrillation: Secondary | ICD-10-CM | POA: Diagnosis not present

## 2019-09-03 DIAGNOSIS — S42302D Unspecified fracture of shaft of humerus, left arm, subsequent encounter for fracture with routine healing: Secondary | ICD-10-CM | POA: Diagnosis not present

## 2019-09-03 DIAGNOSIS — M6281 Muscle weakness (generalized): Secondary | ICD-10-CM | POA: Diagnosis not present

## 2019-09-03 DIAGNOSIS — J449 Chronic obstructive pulmonary disease, unspecified: Secondary | ICD-10-CM | POA: Diagnosis not present

## 2019-09-04 ENCOUNTER — Other Ambulatory Visit: Payer: Self-pay

## 2019-09-04 NOTE — Patient Outreach (Signed)
[facility]) Care Management  09/04/2019  Miranda Ford Jul 30, 1924 [id number]    Unsuccessful outreach attempt. Phone rang multiple times without option to leave a voice message. Will follow-up next week.     [place] Care Management [phone]

## 2019-09-05 DIAGNOSIS — I480 Paroxysmal atrial fibrillation: Secondary | ICD-10-CM | POA: Diagnosis not present

## 2019-09-05 DIAGNOSIS — I5022 Chronic systolic (congestive) heart failure: Secondary | ICD-10-CM | POA: Diagnosis not present

## 2019-09-05 DIAGNOSIS — J449 Chronic obstructive pulmonary disease, unspecified: Secondary | ICD-10-CM | POA: Diagnosis not present

## 2019-09-05 DIAGNOSIS — S42302D Unspecified fracture of shaft of humerus, left arm, subsequent encounter for fracture with routine healing: Secondary | ICD-10-CM | POA: Diagnosis not present

## 2019-09-05 DIAGNOSIS — M6281 Muscle weakness (generalized): Secondary | ICD-10-CM | POA: Diagnosis not present

## 2019-09-05 DIAGNOSIS — S7292XD Unspecified fracture of left femur, subsequent encounter for closed fracture with routine healing: Secondary | ICD-10-CM | POA: Diagnosis not present

## 2019-09-07 DIAGNOSIS — I480 Paroxysmal atrial fibrillation: Secondary | ICD-10-CM | POA: Diagnosis not present

## 2019-09-07 DIAGNOSIS — M6281 Muscle weakness (generalized): Secondary | ICD-10-CM | POA: Diagnosis not present

## 2019-09-07 DIAGNOSIS — S42302D Unspecified fracture of shaft of humerus, left arm, subsequent encounter for fracture with routine healing: Secondary | ICD-10-CM | POA: Diagnosis not present

## 2019-09-07 DIAGNOSIS — J449 Chronic obstructive pulmonary disease, unspecified: Secondary | ICD-10-CM | POA: Diagnosis not present

## 2019-09-07 DIAGNOSIS — I5022 Chronic systolic (congestive) heart failure: Secondary | ICD-10-CM | POA: Diagnosis not present

## 2019-09-07 DIAGNOSIS — Z7901 Long term (current) use of anticoagulants: Secondary | ICD-10-CM | POA: Diagnosis not present

## 2019-09-07 DIAGNOSIS — S7292XD Unspecified fracture of left femur, subsequent encounter for closed fracture with routine healing: Secondary | ICD-10-CM | POA: Diagnosis not present

## 2019-09-08 DIAGNOSIS — J449 Chronic obstructive pulmonary disease, unspecified: Secondary | ICD-10-CM | POA: Diagnosis not present

## 2019-09-08 DIAGNOSIS — S7292XD Unspecified fracture of left femur, subsequent encounter for closed fracture with routine healing: Secondary | ICD-10-CM | POA: Diagnosis not present

## 2019-09-08 DIAGNOSIS — S42302D Unspecified fracture of shaft of humerus, left arm, subsequent encounter for fracture with routine healing: Secondary | ICD-10-CM | POA: Diagnosis not present

## 2019-09-08 DIAGNOSIS — M6281 Muscle weakness (generalized): Secondary | ICD-10-CM | POA: Diagnosis not present

## 2019-09-08 DIAGNOSIS — I5022 Chronic systolic (congestive) heart failure: Secondary | ICD-10-CM | POA: Diagnosis not present

## 2019-09-08 DIAGNOSIS — I480 Paroxysmal atrial fibrillation: Secondary | ICD-10-CM | POA: Diagnosis not present

## 2019-09-09 ENCOUNTER — Other Ambulatory Visit: Payer: Self-pay

## 2019-09-09 NOTE — Patient Outreach (Signed)
[place] [facility]) Care Management  09/09/2019  Miranda Ford [birth year] [id number]  Successful outreach with Miranda Ford. She continues to progress well. Denies worsening or concerning symptoms over the past two weeks. She remains compliant with medications and treatment recommendations. Her ability to ambulate continues to improve. Reports ambulating well with her walker. Denies falls since hospital discharge. Current Outpatient Medications on File Prior to Visit  Medication Sig Dispense Refill  . acetaminophen (TYLENOL) 325 MG tablet Take 1-2 tablets (325-650 mg total) by mouth every 6 (six) hours as needed for mild pain (pain score 1-3 or temp > 100.5). 60 tablet   . amiodarone (PACERONE) 200 MG tablet TAKE (1/2) TABLET BY MOUTH ONCE DAILY. (Patient taking differently: Take 100 mg by mouth daily. ) 45 tablet 3  . atorvastatin (LIPITOR) 40 MG tablet Take 1 tablet (40 mg total) by mouth at bedtime. 90 tablet 3  . bisacodyl (DULCOLAX) 10 MG suppository Place 1 suppository (10 mg total) rectally daily as needed for moderate constipation.    . calcium carbonate (OSCAL) 1500 (600 Ca) MG TABS tablet Take 1,500 mg by mouth daily.     . carvedilol (COREG) 3.125 MG tablet TAKE 1 TABLET BY MOUTH TWICE DAILY WITH A MEAL. (Patient taking differently: Take 3.125 mg by mouth 2 (two) times daily with a meal. ) 60 tablet 5  . cholecalciferol (VITAMIN D) 25 MCG tablet Take 2 tablets (2,000 Units total) by mouth 2 (two) times daily. 60 tablet 0  . docusate sodium (COLACE) 100 MG capsule Take 1 capsule (100 mg total) by mouth 2 (two) times daily.    Marland Kitchen ELIQUIS 2.5 MG TABS tablet TAKE 1 TABLET BY MOUTH AT 10AM AND 1 TABLET AT 9PM. (Patient taking differently: Take 2.5 mg by mouth 2 (two) times daily. ) 60 tablet 6  . feeding supplement, ENSURE ENLIVE, (ENSURE ENLIVE) LIQD Take 237 mLs by mouth 2 (two) times daily between meals. 237 mL 12  . Multiple Vitamins-Minerals (ICAPS PO) Take 1 capsule by mouth  daily.     . polyethylene glycol (MIRALAX / GLYCOLAX) 17 g packet Take 17 g by mouth 2 (two) times daily.    Marland Kitchen spironolactone (ALDACTONE) 25 MG tablet Take 0.5 tablets (12.5 mg total) by mouth every other day. 45 tablet 3  . vitamin C (VITAMIN C) 500 MG tablet Take 1 tablet (500 mg total) by mouth daily. 60 tablet 0   No current facility-administered medications on file prior to visit.    PLAN -Will continue routine outreach.  [place] Care Management [phone]

## 2019-09-10 DIAGNOSIS — I480 Paroxysmal atrial fibrillation: Secondary | ICD-10-CM | POA: Diagnosis not present

## 2019-09-10 DIAGNOSIS — S42302D Unspecified fracture of shaft of humerus, left arm, subsequent encounter for fracture with routine healing: Secondary | ICD-10-CM | POA: Diagnosis not present

## 2019-09-10 DIAGNOSIS — S7292XD Unspecified fracture of left femur, subsequent encounter for closed fracture with routine healing: Secondary | ICD-10-CM | POA: Diagnosis not present

## 2019-09-10 DIAGNOSIS — I5022 Chronic systolic (congestive) heart failure: Secondary | ICD-10-CM | POA: Diagnosis not present

## 2019-09-10 DIAGNOSIS — M6281 Muscle weakness (generalized): Secondary | ICD-10-CM | POA: Diagnosis not present

## 2019-09-10 DIAGNOSIS — J449 Chronic obstructive pulmonary disease, unspecified: Secondary | ICD-10-CM | POA: Diagnosis not present

## 2019-09-15 DIAGNOSIS — S42302D Unspecified fracture of shaft of humerus, left arm, subsequent encounter for fracture with routine healing: Secondary | ICD-10-CM | POA: Diagnosis not present

## 2019-09-15 DIAGNOSIS — M6281 Muscle weakness (generalized): Secondary | ICD-10-CM | POA: Diagnosis not present

## 2019-09-15 DIAGNOSIS — S7292XD Unspecified fracture of left femur, subsequent encounter for closed fracture with routine healing: Secondary | ICD-10-CM | POA: Diagnosis not present

## 2019-09-15 DIAGNOSIS — I5022 Chronic systolic (congestive) heart failure: Secondary | ICD-10-CM | POA: Diagnosis not present

## 2019-09-15 DIAGNOSIS — J449 Chronic obstructive pulmonary disease, unspecified: Secondary | ICD-10-CM | POA: Diagnosis not present

## 2019-09-15 DIAGNOSIS — I480 Paroxysmal atrial fibrillation: Secondary | ICD-10-CM | POA: Diagnosis not present

## 2019-09-16 DIAGNOSIS — I480 Paroxysmal atrial fibrillation: Secondary | ICD-10-CM | POA: Diagnosis not present

## 2019-09-16 DIAGNOSIS — S42302D Unspecified fracture of shaft of humerus, left arm, subsequent encounter for fracture with routine healing: Secondary | ICD-10-CM | POA: Diagnosis not present

## 2019-09-16 DIAGNOSIS — S7292XD Unspecified fracture of left femur, subsequent encounter for closed fracture with routine healing: Secondary | ICD-10-CM | POA: Diagnosis not present

## 2019-09-16 DIAGNOSIS — I5022 Chronic systolic (congestive) heart failure: Secondary | ICD-10-CM | POA: Diagnosis not present

## 2019-09-16 DIAGNOSIS — M6281 Muscle weakness (generalized): Secondary | ICD-10-CM | POA: Diagnosis not present

## 2019-09-16 DIAGNOSIS — J449 Chronic obstructive pulmonary disease, unspecified: Secondary | ICD-10-CM | POA: Diagnosis not present

## 2019-09-18 DIAGNOSIS — S7292XD Unspecified fracture of left femur, subsequent encounter for closed fracture with routine healing: Secondary | ICD-10-CM | POA: Diagnosis not present

## 2019-09-18 DIAGNOSIS — M6281 Muscle weakness (generalized): Secondary | ICD-10-CM | POA: Diagnosis not present

## 2019-09-18 DIAGNOSIS — I480 Paroxysmal atrial fibrillation: Secondary | ICD-10-CM | POA: Diagnosis not present

## 2019-09-18 DIAGNOSIS — S42302D Unspecified fracture of shaft of humerus, left arm, subsequent encounter for fracture with routine healing: Secondary | ICD-10-CM | POA: Diagnosis not present

## 2019-09-18 DIAGNOSIS — I5022 Chronic systolic (congestive) heart failure: Secondary | ICD-10-CM | POA: Diagnosis not present

## 2019-09-18 DIAGNOSIS — J449 Chronic obstructive pulmonary disease, unspecified: Secondary | ICD-10-CM | POA: Diagnosis not present

## 2019-09-22 DIAGNOSIS — J449 Chronic obstructive pulmonary disease, unspecified: Secondary | ICD-10-CM | POA: Diagnosis not present

## 2019-09-22 DIAGNOSIS — M6281 Muscle weakness (generalized): Secondary | ICD-10-CM | POA: Diagnosis not present

## 2019-09-22 DIAGNOSIS — I480 Paroxysmal atrial fibrillation: Secondary | ICD-10-CM | POA: Diagnosis not present

## 2019-09-22 DIAGNOSIS — S7292XD Unspecified fracture of left femur, subsequent encounter for closed fracture with routine healing: Secondary | ICD-10-CM | POA: Diagnosis not present

## 2019-09-22 DIAGNOSIS — S42302D Unspecified fracture of shaft of humerus, left arm, subsequent encounter for fracture with routine healing: Secondary | ICD-10-CM | POA: Diagnosis not present

## 2019-09-22 DIAGNOSIS — I5022 Chronic systolic (congestive) heart failure: Secondary | ICD-10-CM | POA: Diagnosis not present

## 2019-09-24 DIAGNOSIS — J449 Chronic obstructive pulmonary disease, unspecified: Secondary | ICD-10-CM | POA: Diagnosis not present

## 2019-09-24 DIAGNOSIS — S7292XD Unspecified fracture of left femur, subsequent encounter for closed fracture with routine healing: Secondary | ICD-10-CM | POA: Diagnosis not present

## 2019-09-24 DIAGNOSIS — I5022 Chronic systolic (congestive) heart failure: Secondary | ICD-10-CM | POA: Diagnosis not present

## 2019-09-24 DIAGNOSIS — S72142D Displaced intertrochanteric fracture of left femur, subsequent encounter for closed fracture with routine healing: Secondary | ICD-10-CM | POA: Diagnosis not present

## 2019-09-24 DIAGNOSIS — I480 Paroxysmal atrial fibrillation: Secondary | ICD-10-CM | POA: Diagnosis not present

## 2019-09-24 DIAGNOSIS — M6281 Muscle weakness (generalized): Secondary | ICD-10-CM | POA: Diagnosis not present

## 2019-09-24 DIAGNOSIS — S42302D Unspecified fracture of shaft of humerus, left arm, subsequent encounter for fracture with routine healing: Secondary | ICD-10-CM | POA: Diagnosis not present

## 2019-09-25 ENCOUNTER — Other Ambulatory Visit: Payer: Self-pay

## 2019-09-25 NOTE — Patient Outreach (Signed)
[facility]) Care Management  09/25/2019  Miranda Ford May 17, 1924 [id number]   Routine outreach with Miranda Ford. She continues to progress well. She remains compliant with medications and treatment recommendations. She anticipates a final evaluation from the home health Physical Therapist within the next week.  She reports completing a follow-up with her Orthopedic Surgeon on yesterday. Reports ambulating very well with her walker.   She continues to receive daily nursing assistant services from [place]. Reports her niece is still available to provide transportation as needed. Denies urgent concerns or immediate care management needs.  Current Outpatient Medications on File Prior to Visit  Medication Sig Dispense Refill  . acetaminophen (TYLENOL) 325 MG tablet Take 1-2 tablets (325-650 mg total) by mouth every 6 (six) hours as needed for mild pain (pain score 1-3 or temp > 100.5). 60 tablet   . amiodarone (PACERONE) 200 MG tablet TAKE (1/2) TABLET BY MOUTH ONCE DAILY. (Patient taking differently: Take 100 mg by mouth daily. ) 45 tablet 3  . atorvastatin (LIPITOR) 40 MG tablet Take 1 tablet (40 mg total) by mouth at bedtime. 90 tablet 3  . bisacodyl (DULCOLAX) 10 MG suppository Place 1 suppository (10 mg total) rectally daily as needed for moderate constipation.    . calcium carbonate (OSCAL) 1500 (600 Ca) MG TABS tablet Take 1,500 mg by mouth daily.     . carvedilol (COREG) 3.125 MG tablet TAKE 1 TABLET BY MOUTH TWICE DAILY WITH A MEAL. (Patient taking differently: Take 3.125 mg by mouth 2 (two) times daily with a meal. ) 60 tablet 5  . cholecalciferol (VITAMIN D) 25 MCG tablet Take 2 tablets (2,000 Units total) by mouth 2 (two) times daily. 60 tablet 0  . docusate sodium (COLACE) 100 MG capsule Take 1 capsule (100 mg total) by mouth 2 (two) times daily.    Marland Kitchen ELIQUIS 2.5 MG TABS tablet TAKE 1 TABLET BY MOUTH AT 10AM AND 1 TABLET AT 9PM. (Patient taking differently: Take 2.5 mg  by mouth 2 (two) times daily. ) 60 tablet 6  . feeding supplement, ENSURE ENLIVE, (ENSURE ENLIVE) LIQD Take 237 mLs by mouth 2 (two) times daily between meals. 237 mL 12  . Multiple Vitamins-Minerals (ICAPS PO) Take 1 capsule by mouth daily.     . polyethylene glycol (MIRALAX / GLYCOLAX) 17 g packet Take 17 g by mouth 2 (two) times daily.    Marland Kitchen spironolactone (ALDACTONE) 25 MG tablet Take 0.5 tablets (12.5 mg total) by mouth every other day. 45 tablet 3  . vitamin C (VITAMIN C) 500 MG tablet Take 1 tablet (500 mg total) by mouth daily. 60 tablet 0   No current facility-administered medications on file prior to visit.        THN CM Care Plan Problem One     Most Recent Value  Care Plan Problem One  Risk for Readmission  Role Documenting the Problem One  Care Management [place] for Problem One  Active  THN Long Term Goal   Over the next 90 days, patient will not be hospitalized.  THN Long Term Goal Start Date  08/12/19  Interventions for Problem One Long Term Goal  Patient completed a surgical follow up on 09/24/19. Discussed provider recommendations. Patient encouraged to continue taking medications as prescribed. Encouraged to continue utilizing the treatment plan provided by the [facility] therapist.  THN CM Short Term Goal #1   Over the next 30 days, patient will take all medications as prescribed.  THN CM Short Term Goal #1 Start Date  08/12/19  THN CM Short Term Goal #1 Met Date  09/09/19  THN CM Short Term Goal #2   Over the next 30 days, patient will attend all appointments as scheduled.  THN CM Short Term Goal #2 Start Date  08/12/19  [facility] CM Short Term Goal #2 Met Date  09/09/19    [facility] CM Care Plan Problem Two     Most Recent Value  Care Plan Problem Two  High Risks for Falls  Role Documenting the Problem Two  Care Management Coordinator  Care Plan for Problem Two  Active  Interventions for Problem Two Long Term Goal   Patient reports ambulating well with a walker.  Reviewed fall precautions and importance of ambulating slowly. Encouraged to continue using caution when standing and turning.   THN Long Term Goal  Over the next 60 days, patient will not experience falls.  THN Long Term Goal Start Date  08/12/19  THN CM Short Term Goal #1   Over the next 30 days patient will comply with home health therapy recommendations.  THN CM Short Term Goal #1 Start Date  08/12/19  THN CM Short Term Goal #1 Met Date   09/09/19  THN CM Short Term Goal #2   Over the next 30 days, patient will use assistive device and request assistance prior to standing.  THN CM Short Term Goal #2 Start Date  08/12/19  [facility] CM Short Term Goal #2 Met Date  09/09/19      PLAN -Will continue routine outreach.   [place] Care Management [phone]

## 2019-09-26 DIAGNOSIS — J449 Chronic obstructive pulmonary disease, unspecified: Secondary | ICD-10-CM | POA: Diagnosis not present

## 2019-09-26 DIAGNOSIS — S42302D Unspecified fracture of shaft of humerus, left arm, subsequent encounter for fracture with routine healing: Secondary | ICD-10-CM | POA: Diagnosis not present

## 2019-09-26 DIAGNOSIS — M6281 Muscle weakness (generalized): Secondary | ICD-10-CM | POA: Diagnosis not present

## 2019-09-26 DIAGNOSIS — I480 Paroxysmal atrial fibrillation: Secondary | ICD-10-CM | POA: Diagnosis not present

## 2019-09-26 DIAGNOSIS — S7292XD Unspecified fracture of left femur, subsequent encounter for closed fracture with routine healing: Secondary | ICD-10-CM | POA: Diagnosis not present

## 2019-09-26 DIAGNOSIS — I5022 Chronic systolic (congestive) heart failure: Secondary | ICD-10-CM | POA: Diagnosis not present

## 2019-09-30 DIAGNOSIS — I480 Paroxysmal atrial fibrillation: Secondary | ICD-10-CM | POA: Diagnosis not present

## 2019-09-30 DIAGNOSIS — S42302D Unspecified fracture of shaft of humerus, left arm, subsequent encounter for fracture with routine healing: Secondary | ICD-10-CM | POA: Diagnosis not present

## 2019-09-30 DIAGNOSIS — M6281 Muscle weakness (generalized): Secondary | ICD-10-CM | POA: Diagnosis not present

## 2019-09-30 DIAGNOSIS — S7292XD Unspecified fracture of left femur, subsequent encounter for closed fracture with routine healing: Secondary | ICD-10-CM | POA: Diagnosis not present

## 2019-09-30 DIAGNOSIS — I5022 Chronic systolic (congestive) heart failure: Secondary | ICD-10-CM | POA: Diagnosis not present

## 2019-09-30 DIAGNOSIS — J449 Chronic obstructive pulmonary disease, unspecified: Secondary | ICD-10-CM | POA: Diagnosis not present

## 2019-10-02 DIAGNOSIS — S42302D Unspecified fracture of shaft of humerus, left arm, subsequent encounter for fracture with routine healing: Secondary | ICD-10-CM | POA: Diagnosis not present

## 2019-10-02 DIAGNOSIS — I5022 Chronic systolic (congestive) heart failure: Secondary | ICD-10-CM | POA: Diagnosis not present

## 2019-10-02 DIAGNOSIS — J449 Chronic obstructive pulmonary disease, unspecified: Secondary | ICD-10-CM | POA: Diagnosis not present

## 2019-10-02 DIAGNOSIS — S7292XD Unspecified fracture of left femur, subsequent encounter for closed fracture with routine healing: Secondary | ICD-10-CM | POA: Diagnosis not present

## 2019-10-02 DIAGNOSIS — M6281 Muscle weakness (generalized): Secondary | ICD-10-CM | POA: Diagnosis not present

## 2019-10-02 DIAGNOSIS — I480 Paroxysmal atrial fibrillation: Secondary | ICD-10-CM | POA: Diagnosis not present

## 2019-10-15 DIAGNOSIS — N183 Chronic kidney disease, stage 3 unspecified: Secondary | ICD-10-CM | POA: Diagnosis not present

## 2019-10-15 DIAGNOSIS — Z23 Encounter for immunization: Secondary | ICD-10-CM | POA: Diagnosis not present

## 2019-10-15 DIAGNOSIS — E7849 Other hyperlipidemia: Secondary | ICD-10-CM | POA: Diagnosis not present

## 2019-10-15 DIAGNOSIS — I482 Chronic atrial fibrillation, unspecified: Secondary | ICD-10-CM | POA: Diagnosis not present

## 2019-10-15 DIAGNOSIS — R7309 Other abnormal glucose: Secondary | ICD-10-CM | POA: Diagnosis not present

## 2019-10-15 DIAGNOSIS — I517 Cardiomegaly: Secondary | ICD-10-CM | POA: Diagnosis not present

## 2019-10-15 DIAGNOSIS — E559 Vitamin D deficiency, unspecified: Secondary | ICD-10-CM | POA: Diagnosis not present

## 2019-10-15 DIAGNOSIS — M810 Age-related osteoporosis without current pathological fracture: Secondary | ICD-10-CM | POA: Diagnosis not present

## 2019-10-15 DIAGNOSIS — Z6822 Body mass index (BMI) 22.0-22.9, adult: Secondary | ICD-10-CM | POA: Diagnosis not present

## 2019-10-16 ENCOUNTER — Other Ambulatory Visit: Payer: Self-pay

## 2019-10-16 ENCOUNTER — Ambulatory Visit (INDEPENDENT_AMBULATORY_CARE_PROVIDER_SITE_OTHER): Payer: Medicare Other | Admitting: Cardiology

## 2019-10-16 ENCOUNTER — Encounter: Payer: Self-pay | Admitting: Cardiology

## 2019-10-16 VITALS — BP 122/68 | HR 54 | Temp 97.2°F | Ht 64.0 in | Wt 126.0 lb

## 2019-10-16 DIAGNOSIS — I471 Supraventricular tachycardia: Secondary | ICD-10-CM | POA: Diagnosis not present

## 2019-10-16 DIAGNOSIS — I429 Cardiomyopathy, unspecified: Secondary | ICD-10-CM | POA: Diagnosis not present

## 2019-10-16 DIAGNOSIS — Z86711 Personal history of pulmonary embolism: Secondary | ICD-10-CM | POA: Diagnosis not present

## 2019-10-16 NOTE — Patient Instructions (Signed)
Medication Instructions:  Your physician recommends that you continue on your current medications as directed. Please refer to the Current Medication list given to you today.  *If you need a refill on your cardiac medications before your next appointment, please call your pharmacy*  Lab Work: None today If you have labs (blood work) drawn today and your tests are completely normal, you will receive your results only by: . MyChart Message (if you have MyChart) OR . A paper copy in the mail If you have any lab test that is abnormal or we need to change your treatment, we will call you to review the results.  Testing/Procedures: None today  Follow-Up: At [facility], you and your health needs are our priority.  As part of our continuing mission to provide you with exceptional heart care, we have created designated Provider Care Teams.  These Care Teams include your primary Cardiologist (physician) and Advanced Practice Providers (APPs -  Physician Assistants and Nurse Practitioners) who all work together to provide you with the care you need, when you need it.  Your next appointment:   6 months  The format for your next appointment:   In Person  Provider:   Samuel McDowell, MD  Other Instructions None     Thank you for choosing [facility] !         

## 2019-10-16 NOTE — Progress Notes (Signed)
Cardiology Office Note  Date: 10/16/2019   ID: Miranda Ford, DOB 20-Feb-1924, MRN [id number]  PCP:  Sharilyn Sites, MD  Cardiologist:  Rozann Lesches, MD Electrophysiologist:  None   Chief Complaint  Patient presents with  . Cardiac follow-up    History of Present Illness: Miranda Ford is a 85 y.o. female last seen in December 2019.  She is here today with her daughter.  She had a fall back in July resulting in left proximal humeral fracture and left hip fracture.  Shoulder did not require surgical management, she did have ORIF with IM nail to manage the hip.  She was subsequently in the [facility] and then did back at home with home PT.  She still has nursing assistance at home.  She has improved but not to prior baseline in terms of mobility.  From a cardiac perspective, she does not report any palpitations or chest pain.  No syncope.  The fall that she experienced was mechanical in nature, also complicated by balance problems.  She is due for follow-up TSH and LFTs on amiodarone.  She states that she just had lab work yesterday with Dr. Hilma Favors, we are requesting the results.  I personally reviewed her ECG today which shows sinus bradycardia with IVCD, normal QTc.  Past Medical History:  Diagnosis Date  . Cataracts, bilateral   . Chronic systolic CHF (congestive heart failure) ([place])    a. dx 06/2016 - conservative rx recommended. EF 20% with biventricular failure in setting of PE.  Marland Kitchen Closed fracture of left proximal humerus 07/11/2019  . Demand ischemia ([place])   . Epistaxis   . Macular degeneration    Dry OD; wet OS  . Osteoporosis    07/18/16 previously on Premarin; S/P > 10 years Alendronate  . Paroxysmal atrial tachycardia ([place])    a. placed on amiodarone 06/2016.  . Pulmonary emboli ([place]) 06/2016    Past Surgical History:  Procedure Laterality Date  . CATARACT EXTRACTION     bilaterally  . CHOLECYSTECTOMY    . INTRAMEDULLARY (IM) NAIL INTERTROCHANTERIC Left  07/11/2019   Procedure: INTRAMEDULLARY (IM) NAIL INTERTROCHANTRIC;  Surgeon: Altamese Maybee, MD;  Location: [place];  Service: Orthopedics;  Laterality: Left;  . TONSILLECTOMY      Current Outpatient Medications  Medication Sig Dispense Refill  . acetaminophen (TYLENOL) 325 MG tablet Take 1-2 tablets (325-650 mg total) by mouth every 6 (six) hours as needed for mild pain (pain score 1-3 or temp > 100.5). 60 tablet   . amiodarone (PACERONE) 200 MG tablet TAKE (1/2) TABLET BY MOUTH ONCE DAILY. (Patient taking differently: Take 100 mg by mouth daily. ) 45 tablet 3  . atorvastatin (LIPITOR) 40 MG tablet Take 1 tablet (40 mg total) by mouth at bedtime. 90 tablet 3  . bisacodyl (DULCOLAX) 10 MG suppository Place 1 suppository (10 mg total) rectally daily as needed for moderate constipation.    . calcium carbonate (OSCAL) 1500 (600 Ca) MG TABS tablet Take 1,500 mg by mouth daily.     . carvedilol (COREG) 3.125 MG tablet TAKE 1 TABLET BY MOUTH TWICE DAILY WITH A MEAL. (Patient taking differently: Take 3.125 mg by mouth 2 (two) times daily with a meal. ) 60 tablet 5  . cholecalciferol (VITAMIN D) 25 MCG tablet Take 2 tablets (2,000 Units total) by mouth 2 (two) times daily. 60 tablet 0  . docusate sodium (COLACE) 100 MG capsule Take 1 capsule (100 mg total) by mouth 2 (two) times  daily.    Marland Kitchen ELIQUIS 2.5 MG TABS tablet TAKE 1 TABLET BY MOUTH AT 10AM AND 1 TABLET AT 9PM. (Patient taking differently: Take 2.5 mg by mouth 2 (two) times daily. ) 60 tablet 6  . feeding supplement, ENSURE ENLIVE, (ENSURE ENLIVE) LIQD Take 237 mLs by mouth 2 (two) times daily between meals. 237 mL 12  . Multiple Vitamins-Minerals (ICAPS PO) Take 1 capsule by mouth daily.     . polyethylene glycol (MIRALAX / GLYCOLAX) 17 g packet Take 17 g by mouth 2 (two) times daily.    . vitamin C (VITAMIN C) 500 MG tablet Take 1 tablet (500 mg total) by mouth daily. 60 tablet 0  . spironolactone (ALDACTONE) 25 MG tablet Take 0.5 tablets (12.5 mg  total) by mouth every other day. 45 tablet 3   No current facility-administered medications for this visit.    Allergies:  Codeine and Penicillins   Social History: The patient  reports that she quit smoking about 45 years ago. Her smoking use included cigarettes. She has never used smokeless tobacco. She reports that she does not drink alcohol or use drugs.   ROS:  Please see the history of present illness. Otherwise, complete review of systems is positive for none.  All other systems are reviewed and negative.   Physical Exam: VS:  BP 122/68   Pulse (!) 54   Temp (!) 97.2 F (36.2 C)   Ht 5\' 4"  (1.626 m)   Wt 126 lb (57.2 kg)   SpO2 94%   BMI 21.63 kg/m , BMI Body mass index is 21.63 kg/m.  Wt Readings from Last 3 Encounters:  10/16/19 126 lb (57.2 kg)  07/10/19 125 lb (56.7 kg)  12/02/18 125 lb (56.7 kg)    General: Elderly woman, appears comfortable at rest. HEENT: Conjunctiva and lids normal, wearing a mask. Neck: Supple, no elevated JVP or carotid bruits, no thyromegaly. Lungs: Clear to auscultation, nonlabored breathing at rest. Cardiac: Regular rate and rhythm, no S3, soft systolic murmur. Abdomen: Soft, nontender, bowel sounds present. Extremities: No pitting edema, distal pulses 2+. Skin: Warm and dry. Musculoskeletal: No kyphosis. Neuropsychiatric: Alert and oriented x3, affect grossly appropriate.  ECG:  An ECG dated 12/02/2018 was personally reviewed today and demonstrated:  Sinus bradycardia with right bundle branch block.  Recent Labwork: 07/13/2019: BUN 23; Creatinine, Ser 1.09; Potassium 4.2; Sodium 136 07/14/2019: Hemoglobin 8.3; Platelets 135     Component Value Date/Time   CHOL 141 07/04/2016 0443   TRIG 41 07/04/2016 0443   HDL 56 07/04/2016 0443   CHOLHDL 2.5 07/04/2016 0443   VLDL 8 07/04/2016 0443   LDLCALC 77 07/04/2016 0443    Other Studies Reviewed Today:  Echocardiogram 09/19/2016: Study Conclusions  - Left ventricle: The cavity size  was mildly dilated. Wall thickness was normal. The estimated ejection fraction was 25%. Diffuse hypokinesis. Doppler parameters are consistent with abnormal left ventricular relaxation (grade 1 diastolic dysfunction). Doppler parameters are consistent with high ventricular filling pressure. - Aortic valve: Probably mild to moderate aortic stenosis vs pseudo-stenosis due to reduced left ventricular output. Moderately thickened, moderately calcified leaflets. There was mild regurgitation. Peak velocity (S): 195 cm/s. Mean gradient (S): 7 mm Hg. Valve area (VTI): 1.41 cm^2. Valve area (Vmax): 1.46 cm^2. Valve area (Vmean): 1.58 cm^2. - Aorta: Aortic root at upper normal limits in diameter. Aortic root dimension: 39 mm (ED). - Mitral valve: Calcified annulus. Mildly thickened, mildly calcified leaflets . There was mild regurgitation. - Left atrium: The  atrium was moderately dilated. - Right ventricle: Systolic function was mildly reduced. - Tricuspid valve: There was moderate regurgitation. - Pulmonary arteries: PA peak pressure: 44 mm Hg (S). - Inferior vena cava: The vessel was severely dilated. The respirophasic diameter changes were blunted (<50%), consistent with elevated central venous pressure.  Assessment and Plan:  1.  Paroxysmal atrial tachycardia.  She does not report any palpitations and is in sinus rhythm today, ECG reviewed.  Plan to continue low-dose amiodarone along with Coreg.  Requesting recent lab work from Dr. Hilma Favors.  2.  History of pulmonary embolus, she is chronically anticoagulated per Dr. Hilma Favors and remains on Eliquis.  3.  Nonischemic cardiomyopathy, LVEF approximately 25%.  We are managing this conservatively without follow-up imaging studies.  Medication Adjustments/Labs and Tests Ordered: Current medicines are reviewed at length with the patient today.  Concerns regarding medicines are outlined above.   Tests Ordered: Orders  Placed This Encounter  Procedures  . EKG 12-Lead    Medication Changes: No orders of the defined types were placed in this encounter.   Disposition:  Follow up 6 months in the [place] office.  Signed, Satira Sark, MD, [facility] 10/16/2019 12:53 PM    [place] at [facility] [address] [place], [place] [address] Phone: [phone]; Fax: [phone]

## 2019-10-20 ENCOUNTER — Other Ambulatory Visit: Payer: Self-pay

## 2019-10-29 DIAGNOSIS — S42215D Unspecified nondisplaced fracture of surgical neck of left humerus, subsequent encounter for fracture with routine healing: Secondary | ICD-10-CM | POA: Diagnosis not present

## 2019-10-29 DIAGNOSIS — M25561 Pain in right knee: Secondary | ICD-10-CM | POA: Diagnosis not present

## 2019-10-29 DIAGNOSIS — S72142D Displaced intertrochanteric fracture of left femur, subsequent encounter for closed fracture with routine healing: Secondary | ICD-10-CM | POA: Diagnosis not present

## 2019-11-14 DIAGNOSIS — Z1389 Encounter for screening for other disorder: Secondary | ICD-10-CM | POA: Diagnosis not present

## 2019-11-14 DIAGNOSIS — Z6822 Body mass index (BMI) 22.0-22.9, adult: Secondary | ICD-10-CM | POA: Diagnosis not present

## 2019-11-14 DIAGNOSIS — Z0000 Encounter for general adult medical examination without abnormal findings: Secondary | ICD-10-CM | POA: Diagnosis not present

## 2019-11-18 ENCOUNTER — Other Ambulatory Visit: Payer: Self-pay | Admitting: Cardiology

## 2019-11-24 DIAGNOSIS — I2699 Other pulmonary embolism without acute cor pulmonale: Secondary | ICD-10-CM | POA: Diagnosis not present

## 2019-11-24 DIAGNOSIS — E7849 Other hyperlipidemia: Secondary | ICD-10-CM | POA: Diagnosis not present

## 2019-11-24 DIAGNOSIS — M810 Age-related osteoporosis without current pathological fracture: Secondary | ICD-10-CM | POA: Diagnosis not present

## 2019-11-24 DIAGNOSIS — I482 Chronic atrial fibrillation, unspecified: Secondary | ICD-10-CM | POA: Diagnosis not present

## 2019-12-25 ENCOUNTER — Other Ambulatory Visit: Payer: Self-pay | Admitting: Cardiology

## 2019-12-25 DIAGNOSIS — I2699 Other pulmonary embolism without acute cor pulmonale: Secondary | ICD-10-CM | POA: Diagnosis not present

## 2019-12-25 DIAGNOSIS — I129 Hypertensive chronic kidney disease with stage 1 through stage 4 chronic kidney disease, or unspecified chronic kidney disease: Secondary | ICD-10-CM | POA: Diagnosis not present

## 2019-12-25 DIAGNOSIS — I482 Chronic atrial fibrillation, unspecified: Secondary | ICD-10-CM | POA: Diagnosis not present

## 2019-12-25 DIAGNOSIS — N1831 Chronic kidney disease, stage 3a: Secondary | ICD-10-CM | POA: Diagnosis not present

## 2020-01-05 ENCOUNTER — Other Ambulatory Visit: Payer: Self-pay | Admitting: Cardiology

## 2020-01-12 DIAGNOSIS — H353113 Nonexudative age-related macular degeneration, right eye, advanced atrophic without subfoveal involvement: Secondary | ICD-10-CM | POA: Diagnosis not present

## 2020-01-12 DIAGNOSIS — H353124 Nonexudative age-related macular degeneration, left eye, advanced atrophic with subfoveal involvement: Secondary | ICD-10-CM | POA: Diagnosis not present

## 2020-01-12 DIAGNOSIS — H43812 Vitreous degeneration, left eye: Secondary | ICD-10-CM | POA: Diagnosis not present

## 2020-01-12 DIAGNOSIS — H43811 Vitreous degeneration, right eye: Secondary | ICD-10-CM | POA: Diagnosis not present

## 2020-01-25 DIAGNOSIS — I482 Chronic atrial fibrillation, unspecified: Secondary | ICD-10-CM | POA: Diagnosis not present

## 2020-01-25 DIAGNOSIS — N1831 Chronic kidney disease, stage 3a: Secondary | ICD-10-CM | POA: Diagnosis not present

## 2020-01-25 DIAGNOSIS — I2699 Other pulmonary embolism without acute cor pulmonale: Secondary | ICD-10-CM | POA: Diagnosis not present

## 2020-01-25 DIAGNOSIS — I129 Hypertensive chronic kidney disease with stage 1 through stage 4 chronic kidney disease, or unspecified chronic kidney disease: Secondary | ICD-10-CM | POA: Diagnosis not present

## 2020-01-28 DIAGNOSIS — M25561 Pain in right knee: Secondary | ICD-10-CM | POA: Diagnosis not present

## 2020-01-28 DIAGNOSIS — M1711 Unilateral primary osteoarthritis, right knee: Secondary | ICD-10-CM | POA: Diagnosis not present

## 2020-02-05 DIAGNOSIS — Z23 Encounter for immunization: Secondary | ICD-10-CM | POA: Diagnosis not present

## 2020-02-22 DIAGNOSIS — I129 Hypertensive chronic kidney disease with stage 1 through stage 4 chronic kidney disease, or unspecified chronic kidney disease: Secondary | ICD-10-CM | POA: Diagnosis not present

## 2020-02-22 DIAGNOSIS — I482 Chronic atrial fibrillation, unspecified: Secondary | ICD-10-CM | POA: Diagnosis not present

## 2020-02-22 DIAGNOSIS — N1831 Chronic kidney disease, stage 3a: Secondary | ICD-10-CM | POA: Diagnosis not present

## 2020-02-22 DIAGNOSIS — I2699 Other pulmonary embolism without acute cor pulmonale: Secondary | ICD-10-CM | POA: Diagnosis not present

## 2020-03-03 ENCOUNTER — Telehealth: Payer: Self-pay

## 2020-03-03 NOTE — Telephone Encounter (Signed)

## 2020-03-05 DIAGNOSIS — Z23 Encounter for immunization: Secondary | ICD-10-CM | POA: Diagnosis not present

## 2020-03-24 DIAGNOSIS — I129 Hypertensive chronic kidney disease with stage 1 through stage 4 chronic kidney disease, or unspecified chronic kidney disease: Secondary | ICD-10-CM | POA: Diagnosis not present

## 2020-03-24 DIAGNOSIS — N1831 Chronic kidney disease, stage 3a: Secondary | ICD-10-CM | POA: Diagnosis not present

## 2020-03-24 DIAGNOSIS — I482 Chronic atrial fibrillation, unspecified: Secondary | ICD-10-CM | POA: Diagnosis not present

## 2020-04-05 ENCOUNTER — Encounter: Payer: Self-pay | Admitting: Cardiology

## 2020-04-05 ENCOUNTER — Telehealth (INDEPENDENT_AMBULATORY_CARE_PROVIDER_SITE_OTHER): Payer: Medicare Other | Admitting: Cardiology

## 2020-04-05 VITALS — Ht 63.0 in | Wt 126.0 lb

## 2020-04-05 DIAGNOSIS — I471 Supraventricular tachycardia: Secondary | ICD-10-CM

## 2020-04-05 DIAGNOSIS — Z86711 Personal history of pulmonary embolism: Secondary | ICD-10-CM | POA: Diagnosis not present

## 2020-04-05 DIAGNOSIS — I428 Other cardiomyopathies: Secondary | ICD-10-CM

## 2020-04-05 DIAGNOSIS — I429 Cardiomyopathy, unspecified: Secondary | ICD-10-CM

## 2020-04-05 NOTE — Progress Notes (Signed)
Virtual Visit via Telephone Note   This visit type was conducted due to national recommendations for restrictions regarding the COVID-19 Pandemic (e.g. social distancing) in an effort to limit this patient's exposure and mitigate transmission in our community.  Due to her co-morbid illnesses, this patient is at least at moderate risk for complications without adequate follow up.  This format is felt to be most appropriate for this patient at this time.  The patient did not have access to video technology/had technical difficulties with video requiring transitioning to audio format only (telephone).  All issues noted in this document were discussed and addressed.  No physical exam could be performed with this format.  Please refer to the patient's chart for her  consent to telehealth for [facility].   The patient was identified using 2 identifiers.  Date:  04/05/2020   ID:  Miranda Ford, DOB [birth year], MRN [id number]  Patient Location: Home Provider Location: Office  PCP:  Sharilyn Sites, MD  Cardiologist:  Rozann Lesches, MD Electrophysiologist:  None   Evaluation Performed:  Follow-Up Visit  Chief Complaint:  Cardiac follow-up  History of Present Illness:    Miranda Ford is a 86 y.o. female last seen in October 2020.  We spoke by phone today.  She states that she has been doing reasonably well, no palpitations, dizziness, or syncope.  She does use a walker to ambulate.  Still lives in her own home.  I reviewed her medications which are outlined below.  She is on Eliquis with previous history of pulmonary emboli, this is followed by her PCP.  Otherwise with history of atrial tachycardia she has continued on low-dose amiodarone, tolerating well.  I reviewed her last ECG from October 2020.  She did have interval lab work with PCP which I also reviewed.  The patient does not have symptoms concerning for COVID-19 infection (fever, chills, cough, or new shortness of breath).  She  has had both doses of the vaccine.   Past Medical History:  Diagnosis Date  . Cataracts, bilateral   . Chronic systolic CHF (congestive heart failure) ([place])    a. dx 06/2016 - conservative rx recommended. EF 20% with biventricular failure in setting of PE.  Marland Kitchen Closed fracture of left proximal humerus 07/11/2019  . Demand ischemia ([place])   . Epistaxis   . Macular degeneration    Dry OD; wet OS  . Osteoporosis    07/18/16 previously on Premarin; S/P > 10 years Alendronate  . Paroxysmal atrial tachycardia ([place])    a. placed on amiodarone 06/2016.  . Pulmonary emboli ([place]) 06/2016   Past Surgical History:  Procedure Laterality Date  . CATARACT EXTRACTION     bilaterally  . CHOLECYSTECTOMY    . INTRAMEDULLARY (IM) NAIL INTERTROCHANTERIC Left 07/11/2019   Procedure: INTRAMEDULLARY (IM) NAIL INTERTROCHANTRIC;  Surgeon: Altamese , MD;  Location: [place];  Service: Orthopedics;  Laterality: Left;  . TONSILLECTOMY       Current Meds  Medication Sig  . acetaminophen (TYLENOL) 325 MG tablet Take 1-2 tablets (325-650 mg total) by mouth every 6 (six) hours as needed for mild pain (pain score 1-3 or temp > 100.5).  Marland Kitchen amiodarone (PACERONE) 200 MG tablet Take 0.5 tablets (100 mg total) by mouth daily.  Marland Kitchen atorvastatin (LIPITOR) 40 MG tablet TAKE (1) TABLET BY MOUTH AT BEDTIME.  . bisacodyl (DULCOLAX) 10 MG suppository Place 1 suppository (10 mg total) rectally daily as needed for moderate constipation.  . calcium carbonate (OSCAL)  1500 (600 Ca) MG TABS tablet Take 1,500 mg by mouth daily.   . carvedilol (COREG) 3.125 MG tablet TAKE 1 TABLET BY MOUTH TWICE DAILY WITH A MEAL. (Patient taking differently: Take 3.125 mg by mouth 2 (two) times daily with a meal. )  . ELIQUIS 2.5 MG TABS tablet TAKE 1 TABLET BY MOUTH AT 10AM AND 1 TABLET AT 9PM.  . Multiple Vitamins-Minerals (ICAPS PO) Take 1 capsule by mouth daily.   . Multiple Vitamins-Minerals (ICAPS) CAPS Take by mouth.  . polyethylene glycol  (MIRALAX / GLYCOLAX) 17 g packet Take 17 g by mouth 2 (two) times daily.  Marland Kitchen spironolactone (ALDACTONE) 25 MG tablet Take 0.5 tablets (12.5 mg total) by mouth every other day.  . [DISCONTINUED] cholecalciferol (VITAMIN D) 25 MCG tablet Take 2 tablets (2,000 Units total) by mouth 2 (two) times daily.  . [DISCONTINUED] docusate sodium (COLACE) 100 MG capsule Take 1 capsule (100 mg total) by mouth 2 (two) times daily.  . [DISCONTINUED] feeding supplement, ENSURE ENLIVE, (ENSURE ENLIVE) LIQD Take 237 mLs by mouth 2 (two) times daily between meals.  . [DISCONTINUED] vitamin C (VITAMIN C) 500 MG tablet Take 1 tablet (500 mg total) by mouth daily.     Allergies:   Codeine and Penicillins   ROS:   No palpitations or syncope.   Prior CV studies:   The following studies were reviewed today:  Echocardiogram 09/19/2016: Study Conclusions  - Left ventricle: The cavity size was mildly dilated. Wall thickness was normal. The estimated ejection fraction was 25%. Diffuse hypokinesis. Doppler parameters are consistent with abnormal left ventricular relaxation (grade 1 diastolic dysfunction). Doppler parameters are consistent with high ventricular filling pressure. - Aortic valve: Probably mild to moderate aortic stenosis vs pseudo-stenosis due to reduced left ventricular output. Moderately thickened, moderately calcified leaflets. There was mild regurgitation. Peak velocity (S): 195 cm/s. Mean gradient (S): 7 mm Hg. Valve area (VTI): 1.41 cm^2. Valve area (Vmax): 1.46 cm^2. Valve area (Vmean): 1.58 cm^2. - Aorta: Aortic root at upper normal limits in diameter. Aortic root dimension: 39 mm (ED). - Mitral valve: Calcified annulus. Mildly thickened, mildly calcified leaflets . There was mild regurgitation. - Left atrium: The atrium was moderately dilated. - Right ventricle: Systolic function was mildly reduced. - Tricuspid valve: There was moderate regurgitation. - Pulmonary  arteries: PA peak pressure: 44 mm Hg (S). - Inferior vena cava: The vessel was severely dilated. The respirophasic diameter changes were blunted (<50%), consistent with elevated central venous pressure.  Labs/Other Tests and Data Reviewed:    EKG:  An ECG dated 10/16/2019 was personally reviewed today and demonstrated:  Sinus bradycardia with IVCD, normal QTc.  Recent Labs: 07/13/2019: BUN 23; Creatinine, Ser 1.09; Potassium 4.2; Sodium 136 07/14/2019: Hemoglobin 8.3; Platelets 135  October 2020: Hemoglobin 13.6, platelets 226, BUN 14, creatinine 1.21  Wt Readings from Last 3 Encounters:  04/05/20 126 lb (57.2 kg)  10/16/19 126 lb (57.2 kg)  07/10/19 125 lb (56.7 kg)     Objective:    Vital Signs:  Ht 5\' 3"  (1.6 m)   Wt 126 lb (57.2 kg)   BMI 22.32 kg/m    Unable to obtain vital signs today. Patient spoke in full sentences, not short of breath. No wheezing or coughing.  ASSESSMENT & PLAN:    1.  History of paroxysmal atrial tachycardia.  She reports no progressive palpitations.  Plan to continue low-dose amiodarone and Coreg.  We will obtain a follow-up ECG for her next visit, also TSH  and CMET.  2.  History of pulmonary emboli, she is chronically anticoagulated with Eliquis per Dr. Hilma Favors.  3.  Nonischemic cardiomyopathy, LVEF 25% by echocardiogram in 2017.  We have managed her conservatively.  Presently on Coreg and Aldactone.   Time:   Today, I have spent 5 minutes with the patient with telehealth technology discussing the above problems.     Medication Adjustments/Labs and Tests Ordered: Current medicines are reviewed at length with the patient today.  Concerns regarding medicines are outlined above.   Tests Ordered: Orders Placed This Encounter  Procedures  . Comprehensive Metabolic Panel (CMET)  . TSH    Medication Changes: No orders of the defined types were placed in this encounter.   Follow Up:  In Person 6 months with [place] in the [place]  office.  Signed, Rozann Lesches, MD  04/05/2020 2:25 PM    [place]

## 2020-04-05 NOTE — Patient Instructions (Signed)
Your physician recommends that you continue on your current medications as directed. Please refer to the Current Medication list given to you today.  Your physician recommends that you return for lab work in: IN 6 MONTHS  [place] wants you to follow-up in:   You will receive a reminder letter in the mail two months in advance. If you don't receive a letter, please call our office to schedule the follow-up appointment. Eva PA

## 2020-04-23 DIAGNOSIS — I129 Hypertensive chronic kidney disease with stage 1 through stage 4 chronic kidney disease, or unspecified chronic kidney disease: Secondary | ICD-10-CM | POA: Diagnosis not present

## 2020-04-23 DIAGNOSIS — I482 Chronic atrial fibrillation, unspecified: Secondary | ICD-10-CM | POA: Diagnosis not present

## 2020-04-23 DIAGNOSIS — I2699 Other pulmonary embolism without acute cor pulmonale: Secondary | ICD-10-CM | POA: Diagnosis not present

## 2020-04-23 DIAGNOSIS — N1831 Chronic kidney disease, stage 3a: Secondary | ICD-10-CM | POA: Diagnosis not present

## 2020-04-29 DIAGNOSIS — E559 Vitamin D deficiency, unspecified: Secondary | ICD-10-CM | POA: Diagnosis not present

## 2020-04-29 DIAGNOSIS — I10 Essential (primary) hypertension: Secondary | ICD-10-CM | POA: Diagnosis not present

## 2020-04-29 DIAGNOSIS — E7849 Other hyperlipidemia: Secondary | ICD-10-CM | POA: Diagnosis not present

## 2020-04-29 DIAGNOSIS — Z1389 Encounter for screening for other disorder: Secondary | ICD-10-CM | POA: Diagnosis not present

## 2020-05-24 DIAGNOSIS — I2699 Other pulmonary embolism without acute cor pulmonale: Secondary | ICD-10-CM | POA: Diagnosis not present

## 2020-05-24 DIAGNOSIS — N1831 Chronic kidney disease, stage 3a: Secondary | ICD-10-CM | POA: Diagnosis not present

## 2020-05-24 DIAGNOSIS — I482 Chronic atrial fibrillation, unspecified: Secondary | ICD-10-CM | POA: Diagnosis not present

## 2020-05-24 DIAGNOSIS — I129 Hypertensive chronic kidney disease with stage 1 through stage 4 chronic kidney disease, or unspecified chronic kidney disease: Secondary | ICD-10-CM | POA: Diagnosis not present

## 2020-06-07 DIAGNOSIS — H353112 Nonexudative age-related macular degeneration, right eye, intermediate dry stage: Secondary | ICD-10-CM | POA: Diagnosis not present

## 2020-06-07 DIAGNOSIS — Z961 Presence of intraocular lens: Secondary | ICD-10-CM | POA: Diagnosis not present

## 2020-06-07 DIAGNOSIS — H353123 Nonexudative age-related macular degeneration, left eye, advanced atrophic without subfoveal involvement: Secondary | ICD-10-CM | POA: Diagnosis not present

## 2020-06-19 ENCOUNTER — Emergency Department (HOSPITAL_COMMUNITY): Payer: Medicare Other

## 2020-06-19 ENCOUNTER — Other Ambulatory Visit: Payer: Self-pay

## 2020-06-19 ENCOUNTER — Encounter (HOSPITAL_COMMUNITY): Payer: Self-pay | Admitting: *Deleted

## 2020-06-19 ENCOUNTER — Emergency Department (HOSPITAL_COMMUNITY)
Admission: EM | Admit: 2020-06-19 | Discharge: 2020-06-20 | Disposition: A | Discharge status: Home / Self Care | Payer: Medicare Other | Attending: Emergency Medicine | Admitting: Emergency Medicine

## 2020-06-19 DIAGNOSIS — Y999 Unspecified external cause status: Secondary | ICD-10-CM | POA: Diagnosis not present

## 2020-06-19 DIAGNOSIS — S39012A Strain of muscle, fascia and tendon of lower back, initial encounter: Secondary | ICD-10-CM | POA: Insufficient documentation

## 2020-06-19 DIAGNOSIS — R519 Headache, unspecified: Secondary | ICD-10-CM | POA: Diagnosis not present

## 2020-06-19 DIAGNOSIS — R0902 Hypoxemia: Secondary | ICD-10-CM | POA: Diagnosis not present

## 2020-06-19 DIAGNOSIS — Y939 Activity, unspecified: Secondary | ICD-10-CM | POA: Insufficient documentation

## 2020-06-19 DIAGNOSIS — S3992XA Unspecified injury of lower back, initial encounter: Secondary | ICD-10-CM | POA: Diagnosis present

## 2020-06-19 DIAGNOSIS — S0990XA Unspecified injury of head, initial encounter: Secondary | ICD-10-CM | POA: Diagnosis not present

## 2020-06-19 DIAGNOSIS — W19XXXA Unspecified fall, initial encounter: Secondary | ICD-10-CM | POA: Diagnosis not present

## 2020-06-19 DIAGNOSIS — Z87891 Personal history of nicotine dependence: Secondary | ICD-10-CM | POA: Diagnosis not present

## 2020-06-19 DIAGNOSIS — Z880 Allergy status to penicillin: Secondary | ICD-10-CM | POA: Diagnosis not present

## 2020-06-19 DIAGNOSIS — Y929 Unspecified place or not applicable: Secondary | ICD-10-CM | POA: Insufficient documentation

## 2020-06-19 DIAGNOSIS — M545 Low back pain: Secondary | ICD-10-CM | POA: Diagnosis not present

## 2020-06-19 DIAGNOSIS — R52 Pain, unspecified: Secondary | ICD-10-CM | POA: Diagnosis not present

## 2020-06-19 DIAGNOSIS — I517 Cardiomegaly: Secondary | ICD-10-CM | POA: Diagnosis not present

## 2020-06-19 DIAGNOSIS — R079 Chest pain, unspecified: Secondary | ICD-10-CM | POA: Diagnosis not present

## 2020-06-19 LAB — CBC WITH DIFFERENTIAL/PLATELET
Abs Immature Granulocytes: 0.05 10*3/uL (ref 0.00–0.07)
Basophils Absolute: 0 10*3/uL (ref 0.0–0.1)
Basophils Relative: 0 %
Eosinophils Absolute: 0 10*3/uL (ref 0.0–0.5)
Eosinophils Relative: 0 %
HCT: 44.9 % (ref 36.0–46.0)
Hemoglobin: 14.2 g/dL (ref 12.0–15.0)
Immature Granulocytes: 1 %
Lymphocytes Relative: 10 %
Lymphs Abs: 0.9 10*3/uL (ref 0.7–4.0)
MCH: 30.9 pg (ref 26.0–34.0)
MCHC: 31.6 g/dL (ref 30.0–36.0)
MCV: 97.8 fL (ref 80.0–100.0)
Monocytes Absolute: 0.6 10*3/uL (ref 0.1–1.0)
Monocytes Relative: 7 %
Neutro Abs: 7.7 10*3/uL (ref 1.7–7.7)
Neutrophils Relative %: 82 %
Platelets: 187 10*3/uL (ref 150–400)
RBC: 4.59 MIL/uL (ref 3.87–5.11)
RDW: 14.6 % (ref 11.5–15.5)
WBC: 9.3 10*3/uL (ref 4.0–10.5)
nRBC: 0 % (ref 0.0–0.2)

## 2020-06-19 LAB — COMPREHENSIVE METABOLIC PANEL
ALT: 25 U/L (ref 0–44)
AST: 38 U/L (ref 15–41)
Albumin: 4.1 g/dL (ref 3.5–5.0)
Alkaline Phosphatase: 105 U/L (ref 38–126)
Anion gap: 10 (ref 5–15)
BUN: 21 mg/dL (ref 8–23)
CO2: 27 mmol/L (ref 22–32)
Calcium: 8.9 mg/dL (ref 8.9–10.3)
Chloride: 99 mmol/L (ref 98–111)
Creatinine, Ser: 1.11 mg/dL — ABNORMAL HIGH (ref 0.44–1.00)
GFR calc Af Amer: 50 mL/min — ABNORMAL LOW (ref >60)
GFR calc non Af Amer: 43 mL/min — ABNORMAL LOW (ref >60)
Glucose, Bld: 98 mg/dL (ref 70–99)
Potassium: 4.5 mmol/L (ref 3.5–5.1)
Sodium: 136 mmol/L (ref 135–145)
Total Bilirubin: 0.9 mg/dL (ref 0.3–1.2)
Total Protein: 7 g/dL (ref 6.5–8.1)

## 2020-06-19 LAB — BRAIN NATRIURETIC PEPTIDE: B Natriuretic Peptide: 273 pg/mL — ABNORMAL HIGH (ref 0.0–100.0)

## 2020-06-19 NOTE — ED Notes (Signed)
Pt assisted to restroom by RN.

## 2020-06-19 NOTE — ED Notes (Signed)
Pt transferred to Radiology. Pts family member Don Broach updated on Pts status and treatment workup.

## 2020-06-19 NOTE — ED Triage Notes (Signed)
Pt brought in by [facility] for c/o fall; pt states she tripped over her feet and fell; pt has hematoma to back of head and c/o lower back pain

## 2020-06-20 MED ORDER — ACETAMINOPHEN 325 MG PO TABS
325.0000 mg | ORAL_TABLET | Freq: Once | ORAL | Status: AC
  Administered 2020-06-20: 325 mg via ORAL
  Filled 2020-06-20: qty 1

## 2020-06-20 NOTE — Discharge Instructions (Signed)
Take Tylenol at home as needed.  X-rays of the low part of your back did not show any new injuries you have an old compression fracture which is stable.  CT of the head showed no skull fracture or any brain injury.  We noted here that your oxygen levels seem to be a little marginal would recommend you follow-up with your regular doctor.  Return for any new or worse symptoms.

## 2020-06-20 NOTE — ED Provider Notes (Signed)
[facility] EMERGENCY DEPARTMENT Provider Note   CSN: [id number] Arrival date & time: 06/19/20  1746     History Chief Complaint  Patient presents with  . Fall    Miranda Ford is a 86 y.o. female.  Patient brought in by EMS following a fall.  Patient states she tripped lost her balance and fell backwards.  Hitting the back of her head and also has complaint of low back pain.  Denies any other injuries.  No hip pain legs are moving good.  No loss of consciousness.  We noted that when she was in the hallway her room oxygen sats seem to go down to 86%.  Patient not on home oxygen.  Patient denies any shortness of breath or any chest pain.  Patient is followed by cardiology and does have a history of chronic systolic congestive heart failure.  Patient is on amiodarone and patient is also on Eliquis.        Past Medical History:  Diagnosis Date  . Cataracts, bilateral   . Chronic systolic CHF (congestive heart failure) ([place])    a. dx 06/2016 - conservative rx recommended. EF 20% with biventricular failure in setting of PE.  Marland Kitchen Closed fracture of left proximal humerus 07/11/2019  . Demand ischemia ([place])   . Epistaxis   . Macular degeneration    Dry OD; wet OS  . Osteoporosis    07/18/16 previously on Premarin; S/P > 10 years Alendronate  . Paroxysmal atrial tachycardia ([place])    a. placed on amiodarone 06/2016.  . Pulmonary emboli ([place]) 06/2016    Patient Active Problem List   Diagnosis Date Noted  . Closed fracture of left proximal humerus 07/11/2019  . Closed left hip fracture, initial encounter ([place]) 07/10/2019  . Chronic systolic CHF (congestive heart failure) ([place])   . History of pulmonary embolism   . Bradycardia   . Senile osteoporosis 07/18/2016  . DNR (do not resuscitate) discussion   . Palliative care encounter   . Epistaxis 07/07/2016  . Acute systolic CHF (congestive heart failure) ([place]) 07/05/2016  . Acute respiratory failure with hypoxia ([place]) 07/05/2016  .  Secondary cardiomyopathy ([place])   . Biventricular failure ([place])   . Atrial tachycardia ([place])   . Paroxysmal atrial tachycardia ([place])   . Demand ischemia ([place])   . Acute pulmonary embolism ([place]) 07/03/2016  . COPD exacerbation ([place]) 07/03/2016  . Cardiomegaly 07/03/2016  . Coronary atherosclerosis of native coronary artery 07/03/2016  . Elevated troponin     Past Surgical History:  Procedure Laterality Date  . CATARACT EXTRACTION     bilaterally  . CHOLECYSTECTOMY    . INTRAMEDULLARY (IM) NAIL INTERTROCHANTERIC Left 07/11/2019   Procedure: INTRAMEDULLARY (IM) NAIL INTERTROCHANTRIC;  Surgeon: Altamese [place], MD;  Location: [facility];  Service: Orthopedics;  Laterality: Left;  . TONSILLECTOMY       OB History    Gravida  0   Para      Term      Preterm      AB      Living        SAB      TAB      Ectopic      Multiple      Live Births              Family History  Problem Relation Age of Onset  . Heart disease Mother   . Stroke Mother   . Diabetes Father   . Cancer Sister  1 sister with metastatic bone cancer; 1 with breast cancer    Social History   Tobacco Use  . Smoking status: Former Smoker    Types: Cigarettes    Quit date: 1975    Years since quitting: 46.5  . Smokeless tobacco: Never Used  Vaping Use  . Vaping Use: Never used  Substance Use Topics  . Alcohol use: No  . Drug use: No    Home Medications Prior to Admission medications   Medication Sig Start Date End Date Taking? Authorizing Provider  acetaminophen (TYLENOL) 325 MG tablet Take 1-2 tablets (325-650 mg total) by mouth every 6 (six) hours as needed for mild pain (pain score 1-3 or temp > 100.5). 07/14/19   Ainsley Spinner, PA-C  amiodarone (PACERONE) 200 MG tablet Take 0.5 tablets (100 mg total) by mouth daily. 12/25/19   Satira Sark, MD  atorvastatin (LIPITOR) 40 MG tablet TAKE (1) TABLET BY MOUTH AT BEDTIME. 11/18/19   Satira Sark, MD  bisacodyl (DULCOLAX) 10 MG  suppository Place 1 suppository (10 mg total) rectally daily as needed for moderate constipation. 07/14/19   Hongalgi, Lenis Dickinson, MD  calcium carbonate (OSCAL) 1500 (600 Ca) MG TABS tablet Take 1,500 mg by mouth daily.     [provider]  carvedilol (COREG) 3.125 MG tablet TAKE 1 TABLET BY MOUTH TWICE DAILY WITH A MEAL. Patient taking differently: Take 3.125 mg by mouth 2 (two) times daily with a meal.  04/25/18   Lendon Colonel, NP  ELIQUIS 2.5 MG TABS tablet TAKE 1 TABLET BY MOUTH AT 10AM AND 1 TABLET AT 9PM. 01/05/20   Satira Sark, MD  Multiple Vitamins-Minerals (ICAPS PO) Take 1 capsule by mouth daily.     [provider]  Multiple Vitamins-Minerals (ICAPS) CAPS Take by mouth.    [provider]  polyethylene glycol (MIRALAX / GLYCOLAX) 17 g packet Take 17 g by mouth 2 (two) times daily. 07/14/19   Hongalgi, Lenis Dickinson, MD  spironolactone (ALDACTONE) 25 MG tablet Take 0.5 tablets (12.5 mg total) by mouth every other day. 03/27/17 04/05/20  Lendon Colonel, NP    Allergies    Codeine and Penicillins  Review of Systems   Review of Systems  Constitutional: Negative for chills and fever.  HENT: Negative for congestion, rhinorrhea and sore throat.   Eyes: Negative for visual disturbance.  Respiratory: Negative for cough and shortness of breath.   Cardiovascular: Negative for chest pain and leg swelling.  Gastrointestinal: Negative for abdominal pain, diarrhea, nausea and vomiting.  Genitourinary: Negative for dysuria.  Musculoskeletal: Positive for back pain. Negative for neck pain.  Skin: Negative for rash.  Neurological: Negative for dizziness, light-headedness and headaches.  Hematological: Does not bruise/bleed easily.  Psychiatric/Behavioral: Negative for confusion.    Physical Exam Updated Vital Signs BP (!) 147/80   Pulse 76   Temp 98.4 F (36.9 C) (Oral)   Resp 20   Ht 1.613 m (5' 3.5")   Wt 56.7 kg   SpO2 92%   BMI 21.80 kg/m    Physical Exam Vitals and nursing note reviewed.  Constitutional:      General: She is not in acute distress.    Appearance: Normal appearance. She is well-developed.  HENT:     Head: Normocephalic and atraumatic.     Comments: Will bit of swelling to the posterior part of the head no bleeding. Eyes:     Extraocular Movements: Extraocular movements intact.     Conjunctiva/sclera: Conjunctivae  normal.     Pupils: Pupils are equal, round, and reactive to light.  Neck:     Comments: No tenderness to palpation to the posterior cervical spine. Cardiovascular:     Rate and Rhythm: Normal rate and regular rhythm.     Heart sounds: No murmur heard.   Pulmonary:     Effort: Pulmonary effort is normal. No respiratory distress.     Breath sounds: Normal breath sounds.  Abdominal:     Palpations: Abdomen is soft.     Tenderness: There is no abdominal tenderness.  Musculoskeletal:        General: No deformity. Normal range of motion.     Cervical back: Normal range of motion and neck supple.     Comments: Patient without any neck pain.  Does have some tenderness to palpation to the lower lumbar area.  Good movement of the lower extremities upper extremities no pain in the hips.  Skin:    General: Skin is warm and dry.     Capillary Refill: Capillary refill takes less than 2 seconds.  Neurological:     General: No focal deficit present.     Mental Status: She is alert and oriented to person, place, and time.     Cranial Nerves: No cranial nerve deficit.     Sensory: Sensory deficit present.     ED Results / Procedures / Treatments   Labs (all labs ordered are listed, but only abnormal results are displayed) Labs Reviewed  BRAIN NATRIURETIC PEPTIDE - Abnormal; Notable for the following components:      Result Value   B Natriuretic Peptide 273.0 (*)    All other components within normal limits  COMPREHENSIVE METABOLIC PANEL - Abnormal; Notable for the following components:    Creatinine, Ser 1.11 (*)    GFR calc non Af Amer 43 (*)    GFR calc Af Amer 50 (*)    All other components within normal limits  CBC WITH DIFFERENTIAL/PLATELET    EKG EKG Interpretation  Date/Time:  Saturday June 19 2020 22:57:14 EDT Ventricular Rate:  63 PR Interval:  162 QRS Duration: 128 QT Interval:  472 QTC Calculation: 483 R Axis:   99 Text Interpretation: Normal sinus rhythm with sinus arrhythmia Right bundle branch block Abnormal ECG Confirmed by Fredia Sorrow [phone]) on 06/19/2020 11:04:11 PM   Radiology DG Chest 1 View  Result Date: 06/19/2020 CLINICAL DATA:  Pain. EXAM: CHEST  1 VIEW COMPARISON:  July 10, 2019 FINDINGS: The heart size remains stable from prior study. Aortic calcifications are noted. There is an old healed fracture of the proximal left humerus. There is no pneumothorax. No significant pleural effusion. There is slightly prominent interstitial lung markings with Kerley B lines. There is blunting of the costophrenic angles bilaterally. There is no acute osseous abnormality. IMPRESSION: 1. Cardiomegaly with mild interstitial edema. 2. No acute osseous abnormality. There is an old healed left humeral fracture. Electronically Signed   By: Constance Holster M.D.   On: 06/19/2020 23:54   DG Lumbar Spine Complete  Result Date: 06/19/2020 CLINICAL DATA:  Hypoxia, fall EXAM: LUMBAR SPINE - COMPLETE 4+ VIEW COMPARISON:  06/13/2017 FINDINGS: Rightward scoliosis in the upper lumbar spine. Mild compression through the superior endplate of L4 is stable since prior study. No acute fracture. Diffuse degenerative facet disease. Diffuse osteopenia. IMPRESSION: Stable mild compression fracture through the superior endplate of L4. Scoliosis and degenerative changes. No acute bony abnormality. Electronically Signed   By: Rolm Baptise M.D.  On: 06/19/2020 23:53   CT Head Wo Contrast  Result Date: 06/19/2020 CLINICAL DATA:  Head trauma, fall EXAM: CT HEAD WITHOUT CONTRAST  TECHNIQUE: Contiguous axial images were obtained from the base of the skull through the vertex without intravenous contrast. COMPARISON:  05/08/2018 FINDINGS: Brain: No acute intracranial abnormality. Specifically, no hemorrhage, hydrocephalus, mass lesion, acute infarction, or significant intracranial injury. Vascular: No hyperdense vessel or unexpected calcification. Skull: No acute calvarial abnormality. Sinuses/Orbits: Visualized paranasal sinuses and mastoids clear. Orbital soft tissues unremarkable. Other: None IMPRESSION: No acute intracranial abnormality. Electronically Signed   By: Rolm Baptise M.D.   On: 06/19/2020 23:34    Procedures Procedures (including critical care time)  Medications Ordered in ED Medications  acetaminophen (TYLENOL) tablet 325 mg (has no administration in time range)    ED Course  I have reviewed the triage vital signs and the nursing notes.  Pertinent labs & imaging results that were available during my care of the patient were reviewed by me and considered in my medical decision making (see chart for details).    MDM Rules/Calculators/A&P                          Patient in the hallway did show some low oxygen sats.  Patient eventually moved back to room 5.  And was started on 2 L of oxygen while we did the work-up.  Chest x-ray showed no significant pulmonary edema BNP elevated a little bit of 273 but not significant no leukocytosis.  CT of the head without any acute injuries.  X-rays of the lumbar back showed an nonacute stable lumbar fracture.  Of L4.  Patient's oxygen was turned off and the pulse ox was observed she would occasionally go down to 89 but stayed pretty much 89-91.  Patient without any shortness of breath.  Patient wants to go home.  Is to make sense that her age not to admit her just for slightly low oxygen sat.  Patient may be getting the point where she may need some supplemental oxygen she will follow up with her doctor.  But she prefers  not to be on oxygen.  She seems to be in no distress.  Patient only wants Tylenol for the back pain.    Patient's work-up CT head without any acute findings. Final Clinical Impression(s) / ED Diagnoses Final diagnoses:  Fall, initial encounter  Lumbar strain, initial encounter  Injury of head, initial encounter    Rx / DC Orders ED Discharge Orders    None       Fredia Sorrow, MD 06/20/20 [phone]

## 2020-06-23 DIAGNOSIS — N1831 Chronic kidney disease, stage 3a: Secondary | ICD-10-CM | POA: Diagnosis not present

## 2020-06-23 DIAGNOSIS — I129 Hypertensive chronic kidney disease with stage 1 through stage 4 chronic kidney disease, or unspecified chronic kidney disease: Secondary | ICD-10-CM | POA: Diagnosis not present

## 2020-06-23 DIAGNOSIS — I482 Chronic atrial fibrillation, unspecified: Secondary | ICD-10-CM | POA: Diagnosis not present

## 2020-06-23 DIAGNOSIS — I2699 Other pulmonary embolism without acute cor pulmonale: Secondary | ICD-10-CM | POA: Diagnosis not present

## 2020-06-28 ENCOUNTER — Emergency Department (HOSPITAL_COMMUNITY)
Admission: EM | Admit: 2020-06-28 | Discharge: 2020-06-28 | Disposition: A | Discharge status: Home / Self Care | Payer: Medicare Other | Attending: Emergency Medicine | Admitting: Emergency Medicine

## 2020-06-28 ENCOUNTER — Emergency Department (HOSPITAL_COMMUNITY): Payer: Medicare Other

## 2020-06-28 ENCOUNTER — Other Ambulatory Visit: Payer: Self-pay

## 2020-06-28 ENCOUNTER — Encounter (HOSPITAL_COMMUNITY): Payer: Self-pay

## 2020-06-28 DIAGNOSIS — Z7901 Long term (current) use of anticoagulants: Secondary | ICD-10-CM | POA: Diagnosis not present

## 2020-06-28 DIAGNOSIS — Z79899 Other long term (current) drug therapy: Secondary | ICD-10-CM | POA: Diagnosis not present

## 2020-06-28 DIAGNOSIS — M4856XA Collapsed vertebra, not elsewhere classified, lumbar region, initial encounter for fracture: Secondary | ICD-10-CM | POA: Diagnosis not present

## 2020-06-28 DIAGNOSIS — Z87891 Personal history of nicotine dependence: Secondary | ICD-10-CM | POA: Diagnosis not present

## 2020-06-28 DIAGNOSIS — N83201 Unspecified ovarian cyst, right side: Secondary | ICD-10-CM | POA: Diagnosis not present

## 2020-06-28 DIAGNOSIS — I2510 Atherosclerotic heart disease of native coronary artery without angina pectoris: Secondary | ICD-10-CM | POA: Insufficient documentation

## 2020-06-28 DIAGNOSIS — J441 Chronic obstructive pulmonary disease with (acute) exacerbation: Secondary | ICD-10-CM | POA: Insufficient documentation

## 2020-06-28 DIAGNOSIS — K5900 Constipation, unspecified: Secondary | ICD-10-CM | POA: Insufficient documentation

## 2020-06-28 DIAGNOSIS — N83292 Other ovarian cyst, left side: Secondary | ICD-10-CM | POA: Diagnosis not present

## 2020-06-28 DIAGNOSIS — I5023 Acute on chronic systolic (congestive) heart failure: Secondary | ICD-10-CM | POA: Insufficient documentation

## 2020-06-28 DIAGNOSIS — I700 Atherosclerosis of aorta: Secondary | ICD-10-CM | POA: Diagnosis not present

## 2020-06-28 DIAGNOSIS — K5730 Diverticulosis of large intestine without perforation or abscess without bleeding: Secondary | ICD-10-CM | POA: Diagnosis not present

## 2020-06-28 LAB — BASIC METABOLIC PANEL
Anion gap: 10 (ref 5–15)
BUN: 19 mg/dL (ref 8–23)
CO2: 27 mmol/L (ref 22–32)
Calcium: 8.3 mg/dL — ABNORMAL LOW (ref 8.9–10.3)
Chloride: 98 mmol/L (ref 98–111)
Creatinine, Ser: 1.23 mg/dL — ABNORMAL HIGH (ref 0.44–1.00)
GFR calc Af Amer: 44 mL/min — ABNORMAL LOW (ref >60)
GFR calc non Af Amer: 38 mL/min — ABNORMAL LOW (ref >60)
Glucose, Bld: 96 mg/dL (ref 70–99)
Potassium: 4.8 mmol/L (ref 3.5–5.1)
Sodium: 135 mmol/L (ref 135–145)

## 2020-06-28 LAB — CBC
HCT: 40.9 % (ref 36.0–46.0)
Hemoglobin: 13 g/dL (ref 12.0–15.0)
MCH: 31 pg (ref 26.0–34.0)
MCHC: 31.8 g/dL (ref 30.0–36.0)
MCV: 97.6 fL (ref 80.0–100.0)
Platelets: 259 10*3/uL (ref 150–400)
RBC: 4.19 MIL/uL (ref 3.87–5.11)
RDW: 14.7 % (ref 11.5–15.5)
WBC: 7 10*3/uL (ref 4.0–10.5)
nRBC: 0 % (ref 0.0–0.2)

## 2020-06-28 MED ORDER — POLYETHYLENE GLYCOL 3350 17 G PO PACK: 17.0000 g | PACK | Freq: Every day | ORAL | 1 refills | Status: DC

## 2020-06-28 MED ORDER — IOHEXOL 300 MG/ML  SOLN
75.0000 mL | Freq: Once | INTRAMUSCULAR | Status: AC | PRN
  Administered 2020-06-28: 75 mL via INTRAVENOUS

## 2020-06-28 MED ORDER — IOHEXOL 9 MG/ML PO SOLN
ORAL | Status: AC
  Filled 2020-06-28: qty 500

## 2020-06-28 NOTE — ED Triage Notes (Signed)
Pt reports she was seen last Saturday due to a fall. Pt reports that she has not been able to have a good BM. Stomach feels distended. Pt reports taking tylenol XS. Pt took mag citrate on Wednesday with small result and neighbor gave her an enema yesterday with small results. Pt has been taking colace as well

## 2020-06-28 NOTE — Discharge Instructions (Signed)
For the constipation take MiraLAX daily.  Would recommend taking it 4 times a day at first then cutting down to 2 times a day as things start to improve and then once a day.  Very safe.  For the incidental finding of the ovarian cyst would recommend follow-up with your doctor.  Would recommend consideration for ultrasound to further evaluate it.  Return for any new or worse symptoms.  CT scan showed no evidence of obstruction only showed moderate constipation no evidence of impaction.

## 2020-06-28 NOTE — ED Provider Notes (Signed)
[facility] EMERGENCY DEPARTMENT Provider Note   CSN: [id number] Arrival date & time: 06/28/20  1709     History Chief Complaint  Patient presents with  . Constipation    Miranda Ford is a 86 y.o. female.  Patient presenting with concerns for constipation.  Not currently on any pain medicines.  She feels as if her abdomen distended.  Patient had took mag citrate on Wednesday some result enema yesterday some result and was taking Colace as well.  Patient does not have a history of constipation no nausea or vomiting.  Seen by me on June 25.  Had evidence of a lumbar compression fracture at that time.  Patient has room air oxygen sats to go anywhere from 88 to 90%.  But patient completely asymptomatic.        Past Medical History:  Diagnosis Date  . Cataracts, bilateral   . Chronic systolic CHF (congestive heart failure) ([place])    a. dx 06/2016 - conservative rx recommended. EF 20% with biventricular failure in setting of PE.  Marland Kitchen Closed fracture of left proximal humerus 07/11/2019  . Demand ischemia ([place])   . Epistaxis   . Macular degeneration    Dry OD; wet OS  . Osteoporosis    07/18/16 previously on Premarin; S/P > 10 years Alendronate  . Paroxysmal atrial tachycardia ([place])    a. placed on amiodarone 06/2016.  . Pulmonary emboli ([place]) 06/2016    Patient Active Problem List   Diagnosis Date Noted  . Closed fracture of left proximal humerus 07/11/2019  . Closed left hip fracture, initial encounter ([place]) 07/10/2019  . Chronic systolic CHF (congestive heart failure) ([place])   . History of pulmonary embolism   . Bradycardia   . Senile osteoporosis 07/18/2016  . DNR (do not resuscitate) discussion   . Palliative care encounter   . Epistaxis 07/07/2016  . Acute systolic CHF (congestive heart failure) ([place]) 07/05/2016  . Acute respiratory failure with hypoxia ([place]) 07/05/2016  . Secondary cardiomyopathy ([place])   . Biventricular failure ([place])   . Atrial tachycardia ([place])   .  Paroxysmal atrial tachycardia ([place])   . Demand ischemia ([place])   . Acute pulmonary embolism ([place]) 07/03/2016  . COPD exacerbation ([place]) 07/03/2016  . Cardiomegaly 07/03/2016  . Coronary atherosclerosis of native coronary artery 07/03/2016  . Elevated troponin     Past Surgical History:  Procedure Laterality Date  . CATARACT EXTRACTION     bilaterally  . CHOLECYSTECTOMY    . INTRAMEDULLARY (IM) NAIL INTERTROCHANTERIC Left 07/11/2019   Procedure: INTRAMEDULLARY (IM) NAIL INTERTROCHANTRIC;  Surgeon: Altamese Eakly, MD;  Location: [facility];  Service: Orthopedics;  Laterality: Left;  . TONSILLECTOMY       OB History    Gravida  0   Para      Term      Preterm      AB      Living        SAB      TAB      Ectopic      Multiple      Live Births              Family History  Problem Relation Age of Onset  . Heart disease Mother   . Stroke Mother   . Diabetes Father   . Cancer Sister        1 sister with metastatic bone cancer; 1 with breast cancer    Social History   Tobacco Use  .  Smoking status: Former Smoker    Types: Cigarettes    Quit date: 1975    Years since quitting: 46.5  . Smokeless tobacco: Never Used  Vaping Use  . Vaping Use: Never used  Substance Use Topics  . Alcohol use: No  . Drug use: No    Home Medications Prior to Admission medications   Medication Sig Start Date End Date Taking? Authorizing Provider  acetaminophen (TYLENOL) 325 MG tablet Take 1-2 tablets (325-650 mg total) by mouth every 6 (six) hours as needed for mild pain (pain score 1-3 or temp > 100.5). 07/14/19  Yes Ainsley Spinner, PA-C  amiodarone (PACERONE) 200 MG tablet Take 0.5 tablets (100 mg total) by mouth daily. 12/25/19  Yes Satira Sark, MD  atorvastatin (LIPITOR) 40 MG tablet TAKE (1) TABLET BY MOUTH AT BEDTIME. 11/18/19  Yes Satira Sark, MD  calcium carbonate (OSCAL) 1500 (600 Ca) MG TABS tablet Take 1,500 mg by mouth daily.    Yes [provider]    carvedilol (COREG) 3.125 MG tablet TAKE 1 TABLET BY MOUTH TWICE DAILY WITH A MEAL. Patient taking differently: Take 3.125 mg by mouth 2 (two) times daily with a meal.  04/25/18  Yes Lendon Colonel, NP  docusate sodium (COLACE) 100 MG capsule Take 100 mg by mouth 2 (two) times daily as needed for mild constipation.   Yes [provider]  ELIQUIS 2.5 MG TABS tablet TAKE 1 TABLET BY MOUTH AT 10AM AND 1 TABLET AT 9PM. Patient taking differently: Take 2.5 mg by mouth 2 (two) times daily.  01/05/20  Yes Satira Sark, MD  magnesium citrate SOLN Take 1 Bottle by mouth once.   Yes [provider]  mineral oil enema Place 1 enema rectally once.   Yes [provider]  Multiple Vitamins-Minerals (ICAPS PO) Take 1 capsule by mouth daily.    Yes [provider]  spironolactone (ALDACTONE) 25 MG tablet Take 0.5 tablets (12.5 mg total) by mouth every other day. 03/27/17 06/28/20 Yes Lendon Colonel, NP  polyethylene glycol (MIRALAX MIX-IN PAX) 17 g packet Take 17 g by mouth daily. 06/28/20   Fredia Sorrow, MD    Allergies    Codeine and Penicillins  Review of Systems   Review of Systems  Constitutional: Negative for chills and fever.  HENT: Negative for congestion, rhinorrhea and sore throat.   Eyes: Negative for visual disturbance.  Respiratory: Negative for cough and shortness of breath.   Cardiovascular: Negative for chest pain and leg swelling.  Gastrointestinal: Positive for constipation. Negative for abdominal pain, anal bleeding, diarrhea, nausea and vomiting.  Genitourinary: Negative for dysuria.  Musculoskeletal: Negative for back pain and neck pain.  Skin: Negative for rash.  Neurological: Negative for dizziness, light-headedness and headaches.  Hematological: Does not bruise/bleed easily.  Psychiatric/Behavioral: Negative for confusion.    Physical Exam Updated Vital Signs BP (!) 152/81   Pulse 60   Temp (!) 97.4 F (36.3 C) (Oral)   Resp  16   Ht 1.6 m (5\' 3" )   Wt 56 kg   SpO2 94%   BMI 21.87 kg/m   Physical Exam Vitals and nursing note reviewed.  Constitutional:      General: She is not in acute distress.    Appearance: Normal appearance. She is well-developed.  HENT:     Head: Normocephalic and atraumatic.  Eyes:     Extraocular Movements: Extraocular movements intact.     Conjunctiva/sclera: Conjunctivae normal.  Pupils: Pupils are equal, round, and reactive to light.  Cardiovascular:     Rate and Rhythm: Normal rate and regular rhythm.     Heart sounds: No murmur heard.   Pulmonary:     Effort: Pulmonary effort is normal. No respiratory distress.     Breath sounds: Normal breath sounds.  Abdominal:     General: There is no distension.     Palpations: Abdomen is soft.     Tenderness: There is no abdominal tenderness. There is no guarding.     Comments: Abdomen soft and nontender no distention.  Musculoskeletal:     Cervical back: Normal range of motion and neck supple.  Skin:    General: Skin is warm and dry.     Capillary Refill: Capillary refill takes less than 2 seconds.  Neurological:     General: No focal deficit present.     Mental Status: She is alert and oriented to person, place, and time.     Cranial Nerves: No cranial nerve deficit.     Sensory: No sensory deficit.     Motor: No weakness.     ED Results / Procedures / Treatments   Labs (all labs ordered are listed, but only abnormal results are displayed) Labs Reviewed  BASIC METABOLIC PANEL - Abnormal; Notable for the following components:      Result Value   Creatinine, Ser 1.23 (*)    Calcium 8.3 (*)    GFR calc non Af Amer 38 (*)    GFR calc Af Amer 44 (*)    All other components within normal limits  CBC    EKG None  Radiology CT Abdomen Pelvis W Contrast  Result Date: 06/28/2020 CLINICAL DATA:  86 year old female with abdominal. EXAM: CT ABDOMEN AND PELVIS WITH CONTRAST TECHNIQUE: Multidetector CT imaging of the  abdomen and pelvis was performed using the standard protocol following bolus administration of intravenous contrast. CONTRAST:  32mL OMNIPAQUE IOHEXOL 300 MG/ML  SOLN COMPARISON:  None. FINDINGS: Lower chest: There are bibasilar linear atelectasis/scarring. There is mild cardiomegaly. There is coronary vascular calcification. No intra-abdominal free air or free fluid. Hepatobiliary: The liver is unremarkable. Small scattered calcified granuloma. There is mild intrahepatic biliary ductal dilatation, likely post cholecystectomy. No retained calcified stone noted in the central CBD. Pancreas: Unremarkable. No pancreatic ductal dilatation or surrounding inflammatory changes. Spleen: Small scattered calcified splenic granuloma. The spleen is otherwise unremarkable. Adrenals/Urinary Tract: The adrenal glands are unremarkable. There is mild fullness of the renal collecting system bilaterally without frank hydronephrosis. There is an extrarenal pelvis on the right with pelviectasis and dilatation of the extrarenal pelvis. There is symmetric enhancement and excretion of contrast by both kidneys. The visualized ureters and urinary bladder appear unremarkable. Stomach/Bowel: There is sigmoid diverticulosis without active inflammatory changes. There is moderate stool throughout the colon. There is no bowel obstruction or active inflammation. The appendix is normal. Vascular/Lymphatic: Advanced aortoiliac atherosclerotic disease. The IVC is unremarkable. The SMV, splenic vein, and main portal vein are patent. No portal venous gas. There is no adenopathy. Reproductive: The uterus is grossly unremarkable. There is a 4 cm left ovarian cyst. The right ovary is unremarkable as visualized. Other: None Musculoskeletal: Osteopenia with degenerative changes of the spine. Age indeterminate compression fracture of the inferior endplate of L1, possibly acute or subacute. Correlation with clinical exam and point tenderness recommended. There  is associated proximally 25% loss of vertebral body height. There is chronic compression fracture of superior endplate of L4 with 66%  loss of vertebral body height centrally. There is grade 1 L4-L5 anterolisthesis. Multilevel facet arthropathy. Partially visualized ORIF hardware of the left femoral neck fracture. IMPRESSION: 1. Age indeterminate compression fracture of the inferior endplate of L1, possibly acute or subacute. Correlation with clinical exam and point tenderness recommended. 2. Sigmoid diverticulosis. No bowel obstruction. Normal appendix. 3. A 4 cm left ovarian cyst. This can be better evaluated with ultrasound on a nonemergent/outpatient basis if clinically indicated. 4. Aortic Atherosclerosis (ICD10-I70.0). Electronically Signed   By: Anner Crete M.D.   On: 06/28/2020 20:51    Procedures Procedures (including critical care time)  Medications Ordered in ED Medications  iohexol (OMNIPAQUE) 9 MG/ML oral solution (has no administration in time range)  iohexol (OMNIPAQUE) 300 MG/ML solution 75 mL (75 mLs Intravenous Contrast Given 06/28/20 2013)    ED Course  I have reviewed the triage vital signs and the nursing notes.  Pertinent labs & imaging results that were available during my care of the patient were reviewed by me and considered in my medical decision making (see chart for details).    MDM Rules/Calculators/A&P  CT scan shows no evidence of impaction or obstruction.  Shows moderate constipation.  Patient nontoxic no acute distress.  Abdomen soft and nontender.  Will treat with MiraLAX.  Incidental finding of a cyst ovarian cyst.  Not sure what to make of that at her age.  We will have referred her primary care doctor for further evaluation of this.                        CT scan shows moderate constipation no Final Clinical Impression(s) / ED Diagnoses Final diagnoses:  Constipation, unspecified constipation type  Cyst of right ovary    Rx / DC Orders ED Discharge  Orders         Ordered    polyethylene glycol (MIRALAX MIX-IN PAX) 17 g packet  Daily     Discontinue  Reprint     06/28/20 2242           Fredia Sorrow, MD 06/28/20 2245

## 2020-07-19 DIAGNOSIS — B078 Other viral warts: Secondary | ICD-10-CM | POA: Diagnosis not present

## 2020-07-19 DIAGNOSIS — X32XXXD Exposure to sunlight, subsequent encounter: Secondary | ICD-10-CM | POA: Diagnosis not present

## 2020-07-19 DIAGNOSIS — C44319 Basal cell carcinoma of skin of other parts of face: Secondary | ICD-10-CM | POA: Diagnosis not present

## 2020-07-19 DIAGNOSIS — L570 Actinic keratosis: Secondary | ICD-10-CM | POA: Diagnosis not present

## 2020-07-23 DIAGNOSIS — I129 Hypertensive chronic kidney disease with stage 1 through stage 4 chronic kidney disease, or unspecified chronic kidney disease: Secondary | ICD-10-CM | POA: Diagnosis not present

## 2020-07-23 DIAGNOSIS — N1831 Chronic kidney disease, stage 3a: Secondary | ICD-10-CM | POA: Diagnosis not present

## 2020-07-23 DIAGNOSIS — I482 Chronic atrial fibrillation, unspecified: Secondary | ICD-10-CM | POA: Diagnosis not present

## 2020-07-23 DIAGNOSIS — I2699 Other pulmonary embolism without acute cor pulmonale: Secondary | ICD-10-CM | POA: Diagnosis not present

## 2020-08-24 DIAGNOSIS — I482 Chronic atrial fibrillation, unspecified: Secondary | ICD-10-CM | POA: Diagnosis not present

## 2020-08-24 DIAGNOSIS — I129 Hypertensive chronic kidney disease with stage 1 through stage 4 chronic kidney disease, or unspecified chronic kidney disease: Secondary | ICD-10-CM | POA: Diagnosis not present

## 2020-08-24 DIAGNOSIS — I2699 Other pulmonary embolism without acute cor pulmonale: Secondary | ICD-10-CM | POA: Diagnosis not present

## 2020-08-24 DIAGNOSIS — N1831 Chronic kidney disease, stage 3a: Secondary | ICD-10-CM | POA: Diagnosis not present

## 2020-09-06 DIAGNOSIS — Z85828 Personal history of other malignant neoplasm of skin: Secondary | ICD-10-CM | POA: Diagnosis not present

## 2020-09-06 DIAGNOSIS — Z08 Encounter for follow-up examination after completed treatment for malignant neoplasm: Secondary | ICD-10-CM | POA: Diagnosis not present

## 2020-09-21 ENCOUNTER — Other Ambulatory Visit: Payer: Self-pay | Admitting: Cardiology

## 2020-09-23 DIAGNOSIS — I482 Chronic atrial fibrillation, unspecified: Secondary | ICD-10-CM | POA: Diagnosis not present

## 2020-09-23 DIAGNOSIS — N1831 Chronic kidney disease, stage 3a: Secondary | ICD-10-CM | POA: Diagnosis not present

## 2020-09-23 DIAGNOSIS — I129 Hypertensive chronic kidney disease with stage 1 through stage 4 chronic kidney disease, or unspecified chronic kidney disease: Secondary | ICD-10-CM | POA: Diagnosis not present

## 2020-09-23 DIAGNOSIS — I2699 Other pulmonary embolism without acute cor pulmonale: Secondary | ICD-10-CM | POA: Diagnosis not present

## 2020-09-23 DIAGNOSIS — Z23 Encounter for immunization: Secondary | ICD-10-CM | POA: Diagnosis not present

## 2020-10-06 ENCOUNTER — Ambulatory Visit (INDEPENDENT_AMBULATORY_CARE_PROVIDER_SITE_OTHER): Payer: Medicare Other | Admitting: Student

## 2020-10-06 ENCOUNTER — Encounter: Payer: Self-pay | Admitting: *Deleted

## 2020-10-06 ENCOUNTER — Other Ambulatory Visit: Payer: Self-pay

## 2020-10-06 ENCOUNTER — Encounter: Payer: Self-pay | Admitting: Student

## 2020-10-06 VITALS — BP 124/62 | HR 58 | Ht 63.0 in | Wt 119.0 lb

## 2020-10-06 DIAGNOSIS — Z86711 Personal history of pulmonary embolism: Secondary | ICD-10-CM

## 2020-10-06 DIAGNOSIS — I5022 Chronic systolic (congestive) heart failure: Secondary | ICD-10-CM | POA: Diagnosis not present

## 2020-10-06 DIAGNOSIS — I429 Cardiomyopathy, unspecified: Secondary | ICD-10-CM

## 2020-10-06 DIAGNOSIS — I471 Supraventricular tachycardia: Secondary | ICD-10-CM

## 2020-10-06 NOTE — Progress Notes (Signed)
Cardiology Office Note    Date:  10/06/2020   ID:  Miranda Ford, DOB 18-Jun-1924, MRN [id number]  PCP:  Sharilyn Sites, MD  Cardiologist: Rozann Lesches, MD    Chief Complaint  Patient presents with  . Follow-up    6 month visit    History of Present Illness:    Miranda Ford is a 86 y.o. female with past medical history of paroxysmal atrial tachycardia, chronic systolic CHF (EF 71% by echocardiogram in 2017 and conservative management pursued) and history of PE (on chronic anticoagulation with Eliquis) who presents for a 3-month follow-up visit.  She most recently had a telehealth visit with Dr. Domenic Polite in 03/2020 and was still living at home on her own at that time. She was using a walker for ambulation but denied any recent chest pain or progressive dyspnea on exertion. She was continued on her current medication regimen at that time including Amiodarone 100 mg daily, Atorvastatin 40 mg daily, Coreg 3.125 mg twice daily, Eliquis 2.5 mg twice daily and Spironolactone 12.5 mg daily.  In talking with the patient and her niece today, she reports overall doing well since her last visit. She did suffer a fall this summer and reports back pain for several weeks after along with intermittent issues with constipation but says this has now resolved. She continues to live by herself and performs ADL's independently. Does all of her cooking and cleaning. Family does help with transportation as she no longer drives due to macular degeneration. She denies any recent chest pain or dyspnea on exertion. No recent orthopnea, PND or lower extremity edema. She does experience intermittent palpitations.  Past Medical History:  Diagnosis Date  . Cataracts, bilateral   . Chronic systolic CHF (congestive heart failure) ([place])    a. dx 06/2016 - conservative rx recommended. EF 20% with biventricular failure in setting of PE.  Marland Kitchen Closed fracture of left proximal humerus 07/11/2019  . Demand ischemia ([place])     . Epistaxis   . Macular degeneration    Dry OD; wet OS  . Osteoporosis    07/18/16 previously on Premarin; S/P > 10 years Alendronate  . Paroxysmal atrial tachycardia ([place])    a. placed on amiodarone 06/2016.  . Pulmonary emboli ([place]) 06/2016    Past Surgical History:  Procedure Laterality Date  . CATARACT EXTRACTION     bilaterally  . CHOLECYSTECTOMY    . INTRAMEDULLARY (IM) NAIL INTERTROCHANTERIC Left 07/11/2019   Procedure: INTRAMEDULLARY (IM) NAIL INTERTROCHANTRIC;  Surgeon: Altamese Oak [place], MD;  Location: [facility];  Service: Orthopedics;  Laterality: Left;  . TONSILLECTOMY      Current Medications: Outpatient Medications Prior to Visit  Medication Sig Dispense Refill  . acetaminophen (TYLENOL) 325 MG tablet Take 1-2 tablets (325-650 mg total) by mouth every 6 (six) hours as needed for mild pain (pain score 1-3 or temp > 100.5). 60 tablet   . amiodarone (PACERONE) 200 MG tablet TAKE (1/2) TABLET BY MOUTH ONCE DAILY. 45 tablet 3  . atorvastatin (LIPITOR) 40 MG tablet TAKE (1) TABLET BY MOUTH AT BEDTIME. 90 tablet 3  . calcium carbonate (OSCAL) 1500 (600 Ca) MG TABS tablet Take 1,500 mg by mouth daily.     . carvedilol (COREG) 3.125 MG tablet TAKE 1 TABLET BY MOUTH TWICE DAILY WITH A MEAL. (Patient taking differently: Take 3.125 mg by mouth 2 (two) times daily with a meal. ) 60 tablet 5  . docusate sodium (COLACE) 100 MG capsule Take 100 mg by  mouth 2 (two) times daily as needed for mild constipation.    Marland Kitchen ELIQUIS 2.5 MG TABS tablet TAKE 1 TABLET BY MOUTH AT 10AM AND 1 TABLET AT 9PM. (Patient taking differently: Take 2.5 mg by mouth 2 (two) times daily. ) 60 tablet 11  . Multiple Vitamins-Minerals (ICAPS PO) Take 1 capsule by mouth daily.     . polyethylene glycol (MIRALAX MIX-IN PAX) 17 g packet Take 17 g by mouth daily. 14 each 1  . spironolactone (ALDACTONE) 25 MG tablet Take 0.5 tablets (12.5 mg total) by mouth every other day. 45 tablet 3  . magnesium citrate SOLN Take 1 Bottle by  mouth once.    . mineral oil enema Place 1 enema rectally once.     No facility-administered medications prior to visit.     Allergies:   Codeine and Penicillins   Social History   Socioeconomic History  . Marital status: Widowed    Spouse name: Not on file  . Number of children: Not on file  . Years of education: Not on file  . Highest education level: Not on file  Occupational History  . Not on file  Tobacco Use  . Smoking status: Former Smoker    Types: Cigarettes    Quit date: 1975    Years since quitting: 46.8  . Smokeless tobacco: Never Used  Vaping Use  . Vaping Use: Never used  Substance and Sexual Activity  . Alcohol use: No  . Drug use: No  . Sexual activity: Not Currently  Other Topics Concern  . Not on file  Social History Narrative  . Not on file   Social Determinants of Health   Financial Resource Strain:   . Difficulty of Paying Living Expenses: Not on file  Food Insecurity:   . Worried About Charity fundraiser in the Last Year: Not on file  . Ran Out of Food in the Last Year: Not on file  Transportation Needs:   . Lack of Transportation (Medical): Not on file  . Lack of Transportation (Non-Medical): Not on file  Physical Activity:   . Days of Exercise per Week: Not on file  . Minutes of Exercise per Session: Not on file  Stress:   . Feeling of Stress : Not on file  Social Connections:   . Frequency of Communication with Friends and Family: Not on file  . Frequency of Social Gatherings with Friends and Family: Not on file  . Attends Religious Services: Not on file  . Active Member of Clubs or Organizations: Not on file  . Attends Archivist Meetings: Not on file  . Marital Status: Not on file     Family History:  The patient's family history includes Cancer in her sister; Diabetes in her father; Heart disease in her mother; Stroke in her mother.   Review of Systems:   Please see the history of present illness.     General:  No  chills, fever, night sweats or weight changes.  Cardiovascular:  No chest pain, dyspnea on exertion, edema, orthopnea, palpitations, paroxysmal nocturnal dyspnea. Dermatological: No rash, lesions/masses Respiratory: No cough, dyspnea Urologic: No hematuria, dysuria Abdominal:   No nausea, vomiting, diarrhea, bright red blood per rectum, melena, or hematemesis Neurologic:  No visual changes, wkns, changes in mental status. Positive for vision changes.   All other systems reviewed and are otherwise negative except as noted above.   Physical Exam:    VS:  BP 124/62   Pulse Marland Kitchen)  58   Ht 5\' 3"  (1.6 m)   Wt 119 lb (54 kg)   SpO2 95%   BMI 21.08 kg/m    General: Well developed, elderly female appearing in no acute distress. Head: Normocephalic, atraumatic. Neck: No carotid bruits. JVD not elevated.  Lungs: Respirations regular and unlabored, without wheezes or rales.  Heart: Regular rate and rhythm. No S3 or S4. 2/6 SEM along RUSB. Abdomen: Appears non-distended. No obvious abdominal masses. Msk:  Strength and tone appear normal for age. No obvious joint deformities or effusions. Extremities: No clubbing or cyanosis. No lower extremity edema.  Distal pedal pulses are 2+ bilaterally. Neuro: Alert and oriented X 3. Moves all extremities spontaneously. No focal deficits noted. Psych:  Responds to questions appropriately with a normal affect. Skin: No rashes or lesions noted  Wt Readings from Last 3 Encounters:  10/06/20 119 lb (54 kg)  06/28/20 123 lb 7.3 oz (56 kg)  06/19/20 125 lb (56.7 kg)    Studies/Labs Reviewed:   EKG:  EKG is not ordered today.   Recent Labs: 06/19/2020: ALT 25; B Natriuretic Peptide 273.0 06/28/2020: BUN 19; Creatinine, Ser 1.23; Hemoglobin 13.0; Platelets 259; Potassium 4.8; Sodium 135   Lipid Panel    Component Value Date/Time   CHOL 141 07/04/2016 0443   TRIG 41 07/04/2016 0443   HDL 56 07/04/2016 0443   CHOLHDL 2.5 07/04/2016 0443   VLDL 8 07/04/2016  0443   LDLCALC 77 07/04/2016 0443    Additional studies/ records that were reviewed today include:   Echocardiogram: 08/2016 Study Conclusions   - Left ventricle: The cavity size was mildly dilated. Wall  thickness was normal. The estimated ejection fraction was 25%.  Diffuse hypokinesis. Doppler parameters are consistent with  abnormal left ventricular relaxation (grade 1 diastolic  dysfunction). Doppler parameters are consistent with high  ventricular filling pressure.  - Aortic valve: Probably mild to moderate aortic stenosis vs  pseudo-stenosis due to reduced left ventricular output.  Moderately thickened, moderately calcified leaflets. There was  mild regurgitation. Peak velocity (S): 195 cm/s. Mean gradient  (S): 7 mm Hg. Valve area (VTI): 1.41 cm^2. Valve area (Vmax):  1.46 cm^2. Valve area (Vmean): 1.58 cm^2.  - Aorta: Aortic root at upper normal limits in diameter. Aortic  root dimension: 39 mm (ED).  - Mitral valve: Calcified annulus. Mildly thickened, mildly  calcified leaflets . There was mild regurgitation.  - Left atrium: The atrium was moderately dilated.  - Right ventricle: Systolic function was mildly reduced.  - Tricuspid valve: There was moderate regurgitation.  - Pulmonary arteries: PA peak pressure: 44 mm Hg (S).  - Inferior vena cava: The vessel was severely dilated. The  respirophasic diameter changes were blunted (< 50%), consistent  with elevated central venous pressure.   Assessment:    1. Chronic systolic heart failure ([place])   2. Secondary cardiomyopathy (HCC)   3. Paroxysmal atrial tachycardia ([place])   4. History of pulmonary embolism      Plan:   In order of problems listed above:  1. Chronic Systolic CHF/Secondary Cardiomyopathy - Her EF was reduced to 25% by echocardiogram in 2017 and she had suffered an acute PE at that time. Repeat imaging has not been pursued as she has preferred conservative management given  her age and symptoms have overall been well controlled. We did review possibly obtaining a repeat echocardiogram if anything changes in regards to symptoms and she does seem open to this. - Continue current medication regimen at this  time with Coreg 3.125 mg twice daily and Spironolactone 12.5 mg every other day. Her BP has not allowed for further titration of her medication regimen.   2. Paroxysmal Atrial Tachycardia - She reports occasional palpitations but denies any persistent symptoms. Remains on Coreg 3.125mg  BID and Amiodarone 100mg  daily. She did have recent labs with her PCP and I will request a copy to make sure TSH and LFT's have been checked.   3. History of PE  - She has remained on Eliquis 2.5mg  BID for anticoagulation at the instruction of her PCP. Denies any evidence of active bleeding. Will request most recent labs from her PCP as outlined above.    Medication Adjustments/Labs and Tests Ordered: Current medicines are reviewed at length with the patient today.  Concerns regarding medicines are outlined above.  Medication changes, Labs and Tests ordered today are listed in the Patient Instructions below. Patient Instructions  Medication Instructions:  Your physician recommends that you continue on your current medications as directed. Please refer to the Current Medication list given to you today.  You have been given samples of Eliquis 2.5 mg   *If you need a refill on your cardiac medications before your next appointment, please call your pharmacy*   Lab Work: I will request labs from your PCP.   If you have labs (blood work) drawn today and your tests are completely normal, you will receive your results only by: Marland Kitchen MyChart Message (if you have MyChart) OR . A paper copy in the mail If you have any lab test that is abnormal or we need to change your treatment, we will call you to review the results.   Testing/Procedures: NONE    Follow-Up: At [facility], you and  your health needs are our priority.  As part of our continuing mission to provide you with exceptional heart care, we have created designated Provider Care Teams.  These Care Teams include your primary Cardiologist (physician) and Advanced Practice Providers (APPs -  Physician Assistants and Nurse Practitioners) who all work together to provide you with the care you need, when you need it.  We recommend signing up for the patient portal called "MyChart".  Sign up information is provided on this After Visit Summary.  MyChart is used to connect with patients for Virtual Visits (Telemedicine).  Patients are able to view lab/test results, encounter notes, upcoming appointments, etc.  Non-urgent messages can be sent to your provider as well.   To learn more about what you can do with MyChart, go to [email or web address].    Your next appointment:   6 month(s)  The format for your next appointment:   In Person  Provider:   Rozann Lesches, MD or Bernerd Pho, PA-C   Other Instructions Thank you for choosing [facility]!       Signed, Erma Heritage, PA-C  10/06/2020 7:16 PM    [facility] [place], [place] [address] Phone: [phone] Fax: [phone]

## 2020-10-06 NOTE — Patient Instructions (Signed)
Medication Instructions:  Your physician recommends that you continue on your current medications as directed. Please refer to the Current Medication list given to you today.  You have been given samples of Eliquis 2.5 mg   *If you need a refill on your cardiac medications before your next appointment, please call your pharmacy*   Lab Work: I will request labs from your PCP.   If you have labs (blood work) drawn today and your tests are completely normal, you will receive your results only by: Marland Kitchen MyChart Message (if you have MyChart) OR . A paper copy in the mail If you have any lab test that is abnormal or we need to change your treatment, we will call you to review the results.   Testing/Procedures: NONE    Follow-Up: At [facility], you and your health needs are our priority.  As part of our continuing mission to provide you with exceptional heart care, we have created designated Provider Care Teams.  These Care Teams include your primary Cardiologist (physician) and Advanced Practice Providers (APPs -  Physician Assistants and Nurse Practitioners) who all work together to provide you with the care you need, when you need it.  We recommend signing up for the patient portal called "MyChart".  Sign up information is provided on this After Visit Summary.  MyChart is used to connect with patients for Virtual Visits (Telemedicine).  Patients are able to view lab/test results, encounter notes, upcoming appointments, etc.  Non-urgent messages can be sent to your provider as well.   To learn more about what you can do with MyChart, go to [email or web address].    Your next appointment:   6 month(s)  The format for your next appointment:   In Person  Provider:   Rozann Lesches, MD or Bernerd Pho, PA-C   Other Instructions Thank you for choosing [facility]!

## 2020-11-11 DIAGNOSIS — Z23 Encounter for immunization: Secondary | ICD-10-CM | POA: Diagnosis not present

## 2020-11-22 DIAGNOSIS — I517 Cardiomegaly: Secondary | ICD-10-CM | POA: Diagnosis not present

## 2020-11-22 DIAGNOSIS — E559 Vitamin D deficiency, unspecified: Secondary | ICD-10-CM | POA: Diagnosis not present

## 2020-11-22 DIAGNOSIS — Z1389 Encounter for screening for other disorder: Secondary | ICD-10-CM | POA: Diagnosis not present

## 2020-11-22 DIAGNOSIS — Z0000 Encounter for general adult medical examination without abnormal findings: Secondary | ICD-10-CM | POA: Diagnosis not present

## 2020-11-22 DIAGNOSIS — Z6820 Body mass index (BMI) 20.0-20.9, adult: Secondary | ICD-10-CM | POA: Diagnosis not present

## 2020-11-22 DIAGNOSIS — I10 Essential (primary) hypertension: Secondary | ICD-10-CM | POA: Diagnosis not present

## 2020-11-22 DIAGNOSIS — I482 Chronic atrial fibrillation, unspecified: Secondary | ICD-10-CM | POA: Diagnosis not present

## 2020-11-22 DIAGNOSIS — E7849 Other hyperlipidemia: Secondary | ICD-10-CM | POA: Diagnosis not present

## 2020-11-22 DIAGNOSIS — R5383 Other fatigue: Secondary | ICD-10-CM | POA: Diagnosis not present

## 2020-11-22 DIAGNOSIS — Z0001 Encounter for general adult medical examination with abnormal findings: Secondary | ICD-10-CM | POA: Diagnosis not present

## 2020-11-22 DIAGNOSIS — Z1331 Encounter for screening for depression: Secondary | ICD-10-CM | POA: Diagnosis not present

## 2020-11-22 DIAGNOSIS — E782 Mixed hyperlipidemia: Secondary | ICD-10-CM | POA: Diagnosis not present

## 2020-11-23 DIAGNOSIS — N1831 Chronic kidney disease, stage 3a: Secondary | ICD-10-CM | POA: Diagnosis not present

## 2020-11-23 DIAGNOSIS — I482 Chronic atrial fibrillation, unspecified: Secondary | ICD-10-CM | POA: Diagnosis not present

## 2020-11-23 DIAGNOSIS — I2699 Other pulmonary embolism without acute cor pulmonale: Secondary | ICD-10-CM | POA: Diagnosis not present

## 2020-11-23 DIAGNOSIS — I129 Hypertensive chronic kidney disease with stage 1 through stage 4 chronic kidney disease, or unspecified chronic kidney disease: Secondary | ICD-10-CM | POA: Diagnosis not present

## 2020-11-24 ENCOUNTER — Other Ambulatory Visit: Payer: Self-pay | Admitting: Cardiology

## 2020-12-24 DIAGNOSIS — I2699 Other pulmonary embolism without acute cor pulmonale: Secondary | ICD-10-CM | POA: Diagnosis not present

## 2020-12-24 DIAGNOSIS — I129 Hypertensive chronic kidney disease with stage 1 through stage 4 chronic kidney disease, or unspecified chronic kidney disease: Secondary | ICD-10-CM | POA: Diagnosis not present

## 2020-12-24 DIAGNOSIS — I482 Chronic atrial fibrillation, unspecified: Secondary | ICD-10-CM | POA: Diagnosis not present

## 2020-12-24 DIAGNOSIS — N1831 Chronic kidney disease, stage 3a: Secondary | ICD-10-CM | POA: Diagnosis not present

## 2021-01-12 ENCOUNTER — Encounter (INDEPENDENT_AMBULATORY_CARE_PROVIDER_SITE_OTHER): Payer: Medicare Other | Admitting: Ophthalmology

## 2021-01-22 DIAGNOSIS — I482 Chronic atrial fibrillation, unspecified: Secondary | ICD-10-CM | POA: Diagnosis not present

## 2021-01-22 DIAGNOSIS — N1831 Chronic kidney disease, stage 3a: Secondary | ICD-10-CM | POA: Diagnosis not present

## 2021-01-22 DIAGNOSIS — I2699 Other pulmonary embolism without acute cor pulmonale: Secondary | ICD-10-CM | POA: Diagnosis not present

## 2021-01-22 DIAGNOSIS — I129 Hypertensive chronic kidney disease with stage 1 through stage 4 chronic kidney disease, or unspecified chronic kidney disease: Secondary | ICD-10-CM | POA: Diagnosis not present

## 2021-02-02 ENCOUNTER — Other Ambulatory Visit: Payer: Self-pay | Admitting: Cardiology

## 2021-02-02 NOTE — Telephone Encounter (Signed)
Prescription refill request for Eliquis received. Indication: PE Last office visit: 10/06/20 Scr: 1.080 on 11/22/20 Age: 87 Weight: 54kg  Based on above findings Eliquis 2.5mg  BID is the appropriate dose.  Refill approved.

## 2021-02-14 ENCOUNTER — Ambulatory Visit (INDEPENDENT_AMBULATORY_CARE_PROVIDER_SITE_OTHER): Payer: Medicare Other | Admitting: Ophthalmology

## 2021-02-14 ENCOUNTER — Encounter (INDEPENDENT_AMBULATORY_CARE_PROVIDER_SITE_OTHER): Payer: Self-pay | Admitting: Ophthalmology

## 2021-02-14 ENCOUNTER — Other Ambulatory Visit: Payer: Self-pay

## 2021-02-14 DIAGNOSIS — H353124 Nonexudative age-related macular degeneration, left eye, advanced atrophic with subfoveal involvement: Secondary | ICD-10-CM

## 2021-02-14 DIAGNOSIS — H43813 Vitreous degeneration, bilateral: Secondary | ICD-10-CM

## 2021-02-14 DIAGNOSIS — H353113 Nonexudative age-related macular degeneration, right eye, advanced atrophic without subfoveal involvement: Secondary | ICD-10-CM | POA: Insufficient documentation

## 2021-02-14 NOTE — Progress Notes (Signed)
02/14/2021     CHIEF COMPLAINT Patient presents for Retina Follow Up (1 Year F/U OU//Pt denies noticeable changes to [place] OU since last visit. Pt denies ocular pain, flashes of light, or floaters OU. //)   HISTORY OF PRESENT ILLNESS: Miranda Ford is a 87 y.o. female who presents to the clinic today for:   HPI    Retina Follow Up    Patient presents with  Dry AMD.  In both eyes.  This started 1 year ago.  Severity is mild.  Duration of 1 year.  Since onset it is stable. Additional comments: 1 Year F/U OU  Pt denies noticeable changes to [place] OU since last visit. Pt denies ocular pain, flashes of light, or floaters OU.          Last edited by Rockie Neighbours, [place] on 02/14/2021  9:57 AM. (History)      Referring physician: Sharilyn Sites, MD [address] [place],  [place] [address]  HISTORICAL INFORMATION:   Selected notes from the [facility]: No current outpatient medications on file. (Ophthalmic Drugs)   No current facility-administered medications for this visit. (Ophthalmic Drugs)   Current Outpatient Medications (Other)  Medication Sig  . acetaminophen (TYLENOL) 325 MG tablet Take 1-2 tablets (325-650 mg total) by mouth every 6 (six) hours as needed for mild pain (pain score 1-3 or temp > 100.5).  Marland Kitchen amiodarone (PACERONE) 200 MG tablet TAKE (1/2) TABLET BY MOUTH ONCE DAILY.  Marland Kitchen apixaban (ELIQUIS) 2.5 MG TABS tablet Take 1 tablet (2.5 mg total) by mouth 2 (two) times daily.  Marland Kitchen atorvastatin (LIPITOR) 40 MG tablet TAKE (1) TABLET BY MOUTH AT BEDTIME.  . calcium carbonate (OSCAL) 1500 (600 Ca) MG TABS tablet Take 1,500 mg by mouth daily.   . carvedilol (COREG) 3.125 MG tablet TAKE 1 TABLET BY MOUTH TWICE DAILY WITH A MEAL. (Patient taking differently: Take 3.125 mg by mouth 2 (two) times daily with a meal. )  . docusate sodium (COLACE) 100 MG capsule Take 100 mg by mouth 2 (two) times daily as needed for mild constipation.  . Multiple  Vitamins-Minerals (ICAPS PO) Take 1 capsule by mouth daily.   . polyethylene glycol (MIRALAX MIX-IN PAX) 17 g packet Take 17 g by mouth daily.  Marland Kitchen spironolactone (ALDACTONE) 25 MG tablet Take 0.5 tablets (12.5 mg total) by mouth every other day.   No current facility-administered medications for this visit. (Other)      REVIEW OF SYSTEMS:    ALLERGIES Allergies  Allergen Reactions  . Codeine Nausea Only  . Penicillins Other (See Comments)    "Not allergic just a lot of my family members are allergic". Has patient had a PCN reaction causing immediate rash, facial/tongue/throat swelling, SOB or lightheadedness with hypotension: No Has patient had a PCN reaction causing severe rash involving mucus membranes or skin necrosis: No Has patient had a PCN reaction that required hospitalization No Has patient had a PCN reaction occurring within the last 10 years: No If all of the above answers are "NO", then may proceed with Cephalosporin use.     PAST MEDICAL HISTORY Past Medical History:  Diagnosis Date  . Cataracts, bilateral   . Chronic systolic CHF (congestive heart failure) ([place])    a. dx 06/2016 - conservative rx recommended. EF 20% with biventricular failure in setting of PE.  Marland Kitchen Closed fracture of left proximal humerus 07/11/2019  . Demand ischemia ([place])   .  Epistaxis   . Macular degeneration    Dry OD; wet OS  . Osteoporosis    07/18/16 previously on Premarin; S/P > 10 years Alendronate  . Paroxysmal atrial tachycardia ([place])    a. placed on amiodarone 06/2016.  . Pulmonary emboli ([place]) 06/2016   Past Surgical History:  Procedure Laterality Date  . CATARACT EXTRACTION     bilaterally  . CHOLECYSTECTOMY    . INTRAMEDULLARY (IM) NAIL INTERTROCHANTERIC Left 07/11/2019   Procedure: INTRAMEDULLARY (IM) NAIL INTERTROCHANTRIC;  Surgeon: Altamese [place], MD;  Location: [facility];  Service: Orthopedics;  Laterality: Left;  . TONSILLECTOMY      FAMILY HISTORY Family History  Problem  Relation Age of Onset  . Heart disease Mother   . Stroke Mother   . Diabetes Father   . Cancer Sister        1 sister with metastatic bone cancer; 1 with breast cancer    SOCIAL HISTORY Social History   Tobacco Use  . Smoking status: Former Smoker    Types: Cigarettes    Quit date: 1975    Years since quitting: 47.1  . Smokeless tobacco: Never Used  Vaping Use  . Vaping Use: Never used  Substance Use Topics  . Alcohol use: No  . Drug use: No         OPHTHALMIC EXAM: Base Eye Exam    Visual Acuity (ETDRS)      Right Left   Dist cc 20/70 +1 CF @ 2'   Dist ph cc 20/60 -1 NI   Correction: Glasses       Tonometry (Tonopen, 9:57 AM)      Right Left   Pressure 14 16       Pupils      Pupils Dark Light Shape React APD   Right PERRL 2 2 Round Minimal None   Left PERRL 2 2 Round Minimal None       Visual Fields (Counting fingers)      Left Right    Full Full       Extraocular Movement      Right Left    Full Full       Neuro/Psych    Oriented x3: Yes   Mood/Affect: Normal       Dilation    Both eyes: 1.0% Mydriacyl, 2.5% Phenylephrine @ 10:03 AM        Slit Lamp and Fundus Exam    External Exam      Right Left   External Normal Normal       Slit Lamp Exam      Right Left   Lids/Lashes Normal Normal   Conjunctiva/Sclera White and quiet White and quiet   Cornea Clear Clear   Anterior Chamber Deep and quiet Deep and quiet   Iris Round and reactive Round and reactive   Lens Centered posterior chamber intraocular lens Centered posterior chamber intraocular lens   Anterior Vitreous Normal Normal       Fundus Exam      Right Left   Posterior Vitreous Posterior vitreous detachment Posterior vitreous detachment   Disc Peripapillary atrophy Peripapillary atrophy   C/D Ratio 0.35 0.35   Macula Pigmented atrophy, near the FAZ, no macular thickening, no membrane, no exudates, Intermediate age related macular degeneration Pigmented atrophy, in the FAZ,  no macular thickening, no membrane, no exudates, Intermediate age related macular degeneration   Vessels Normal Normal   Periphery Normal Normal  IMAGING AND PROCEDURES  Imaging and Procedures for 02/14/21  OCT, Retina - OU - Both Eyes       Right Eye Quality was borderline. Progression has been stable. Findings include abnormal foveal contour, central retinal atrophy.   Left Eye Quality was good. Central Foveal Thickness: 249. Progression has been stable. Findings include abnormal foveal contour, retinal drusen , outer retinal atrophy, central retinal atrophy, inner retinal atrophy.                 ASSESSMENT/PLAN:  Advanced nonexudative age-related macular degeneration of left eye with subfoveal involvement Dry ARMD OS accounts for acuity  Advanced nonexudative age-related macular degeneration of right eye without subfoveal involvement The nature of age-related macular degeneration was discussed with the patient as well as the distinction between dry and wet types. Checking an Amsler Grid daily with advice to return immediately should a distortion develop, was given to the patient. The patient 's smoking status now and in the past was determined and advice based on the AREDS study was provided regarding the consumption of antioxidant supplements. AREDS 2 vitamin formulation was recommended. Consumption of dark leafy vegetables and fresh fruits of various colors was recommended. Treatment modalities for wet macular degeneration particularly the use of intravitreal injections of anti-blood vessel growth factors was discussed with the patient. Avastin, Lucentis, and Eylea are the available options. On occasion, therapy includes the use of photodynamic therapy and thermal laser. Stressed to the patient do not rub eyes.  Patient was advised to check Amsler Grid daily and return immediately if changes are noted. Instructions on using the grid were given to the patient. All  patient questions were answered.  Atrophy OD spares the fovea at this time, no signs of CNVM OU      ICD-10-CM   1. Advanced nonexudative age-related macular degeneration of left eye with subfoveal involvement  H35.3124 OCT, Retina - OU - Both Eyes  2. Advanced nonexudative age-related macular degeneration of right eye without subfoveal involvement  H35.3113 OCT, Retina - OU - Both Eyes  3. Posterior vitreous detachment of both eyes  H43.813 OCT, Retina - OU - Both Eyes    1. No active CNVM OU we will continue to monitor.  Left eye has remained stable over the last year.  2.  3.  Ophthalmic Meds Ordered this visit:  No orders of the defined types were placed in this encounter.      Return in about 1 year (around 02/14/2022) for DILATE OU, COLOR FP, OCT.  There are no Patient Instructions on file for this visit.   Explained the diagnoses, plan, and follow up with the patient and they expressed understanding.  Patient expressed understanding of the importance of proper follow up care.   Clent Demark Denielle Bayard M.D. Diseases & Surgery of the Retina and Vitreous [facility] 02/14/21     Abbreviations: M myopia (nearsighted); A astigmatism; H hyperopia (farsighted); P presbyopia; Mrx spectacle prescription;  CTL contact lenses; OD right eye; OS left eye; OU both eyes  XT exotropia; ET esotropia; PEK punctate epithelial keratitis; PEE punctate epithelial erosions; DES dry eye syndrome; MGD meibomian gland dysfunction; ATs artificial tears; PFAT's preservative free artificial tears; [place] nuclear sclerotic cataract; PSC posterior subcapsular cataract; ERM epi-retinal membrane; PVD posterior vitreous detachment; RD retinal detachment; DM diabetes mellitus; DR diabetic retinopathy; NPDR non-proliferative diabetic retinopathy; PDR proliferative diabetic retinopathy; CSME clinically significant macular edema; DME diabetic macular edema; dbh dot blot hemorrhages; CWS cotton wool  spot; POAG primary open angle glaucoma; C/D cup-to-disc ratio; HVF humphrey visual field; GVF goldmann visual field; OCT optical coherence tomography; IOP intraocular pressure; BRVO Branch retinal vein occlusion; CRVO central retinal vein occlusion; CRAO central retinal artery occlusion; BRAO branch retinal artery occlusion; RT retinal tear; SB scleral buckle; PPV pars plana vitrectomy; VH Vitreous hemorrhage; PRP panretinal laser photocoagulation; IVK intravitreal kenalog; VMT vitreomacular traction; MH Macular hole;  NVD neovascularization of the disc; NVE neovascularization elsewhere; AREDS age related eye disease study; ARMD age related macular degeneration; POAG primary open angle glaucoma; EBMD epithelial/anterior basement membrane dystrophy; ACIOL anterior chamber intraocular lens; IOL intraocular lens; PCIOL posterior chamber intraocular lens; Phaco/IOL phacoemulsification with intraocular lens placement; [place] photorefractive keratectomy; LASIK laser assisted in situ keratomileusis; HTN hypertension; DM diabetes mellitus; COPD chronic obstructive pulmonary disease

## 2021-02-14 NOTE — Assessment & Plan Note (Signed)
The nature of age--related macular degeneration was discussed with the patient as well as the distinction between dry and wet types. Checking an Amsler Grid daily with advice to return immediately should a distortion develop, was given to the patient. The patient 's smoking status now and in the past was determined and advice based on the AREDS study was provided regarding the consumption of antioxidant supplements. AREDS 2 vitamin formulation was recommended. Consumption of dark leafy vegetables and fresh fruits of various colors was recommended. Treatment modalities for wet macular degeneration particularly the use of intravitreal injections of anti-blood vessel growth factors was discussed with the patient. Avastin, Lucentis, and Eylea are the available options. On occasion, therapy includes the use of photodynamic therapy and thermal laser. Stressed to the patient do not rub eyes.  Patient was advised to check Amsler Grid daily and return immediately if changes are noted. Instructions on using the grid were given to the patient. All patient questions were answered.  Atrophy OD spares the fovea at this time, no signs of CNVM OU

## 2021-02-14 NOTE — Assessment & Plan Note (Signed)
Dry ARMD OS accounts for acuity

## 2021-02-21 DIAGNOSIS — I2699 Other pulmonary embolism without acute cor pulmonale: Secondary | ICD-10-CM | POA: Diagnosis not present

## 2021-02-21 DIAGNOSIS — I129 Hypertensive chronic kidney disease with stage 1 through stage 4 chronic kidney disease, or unspecified chronic kidney disease: Secondary | ICD-10-CM | POA: Diagnosis not present

## 2021-02-21 DIAGNOSIS — N1831 Chronic kidney disease, stage 3a: Secondary | ICD-10-CM | POA: Diagnosis not present

## 2021-02-21 DIAGNOSIS — I482 Chronic atrial fibrillation, unspecified: Secondary | ICD-10-CM | POA: Diagnosis not present

## 2021-03-23 DIAGNOSIS — I129 Hypertensive chronic kidney disease with stage 1 through stage 4 chronic kidney disease, or unspecified chronic kidney disease: Secondary | ICD-10-CM | POA: Diagnosis not present

## 2021-03-23 DIAGNOSIS — I482 Chronic atrial fibrillation, unspecified: Secondary | ICD-10-CM | POA: Diagnosis not present

## 2021-03-23 DIAGNOSIS — N1831 Chronic kidney disease, stage 3a: Secondary | ICD-10-CM | POA: Diagnosis not present

## 2021-03-28 DIAGNOSIS — Z08 Encounter for follow-up examination after completed treatment for malignant neoplasm: Secondary | ICD-10-CM | POA: Diagnosis not present

## 2021-03-28 DIAGNOSIS — C44311 Basal cell carcinoma of skin of nose: Secondary | ICD-10-CM | POA: Diagnosis not present

## 2021-03-28 DIAGNOSIS — Z85828 Personal history of other malignant neoplasm of skin: Secondary | ICD-10-CM | POA: Diagnosis not present

## 2021-04-23 DIAGNOSIS — I129 Hypertensive chronic kidney disease with stage 1 through stage 4 chronic kidney disease, or unspecified chronic kidney disease: Secondary | ICD-10-CM | POA: Diagnosis not present

## 2021-04-23 DIAGNOSIS — N1831 Chronic kidney disease, stage 3a: Secondary | ICD-10-CM | POA: Diagnosis not present

## 2021-04-23 DIAGNOSIS — I482 Chronic atrial fibrillation, unspecified: Secondary | ICD-10-CM | POA: Diagnosis not present

## 2021-05-09 DIAGNOSIS — C44321 Squamous cell carcinoma of skin of nose: Secondary | ICD-10-CM | POA: Diagnosis not present

## 2021-05-24 DIAGNOSIS — N1831 Chronic kidney disease, stage 3a: Secondary | ICD-10-CM | POA: Diagnosis not present

## 2021-05-24 DIAGNOSIS — I129 Hypertensive chronic kidney disease with stage 1 through stage 4 chronic kidney disease, or unspecified chronic kidney disease: Secondary | ICD-10-CM | POA: Diagnosis not present

## 2021-05-24 DIAGNOSIS — I482 Chronic atrial fibrillation, unspecified: Secondary | ICD-10-CM | POA: Diagnosis not present

## 2021-05-26 DIAGNOSIS — Z23 Encounter for immunization: Secondary | ICD-10-CM | POA: Diagnosis not present

## 2021-05-30 ENCOUNTER — Telehealth: Payer: Self-pay | Admitting: Family Medicine

## 2021-05-30 DIAGNOSIS — I5022 Chronic systolic (congestive) heart failure: Secondary | ICD-10-CM

## 2021-05-30 DIAGNOSIS — Z79899 Other long term (current) drug therapy: Secondary | ICD-10-CM

## 2021-05-30 NOTE — Telephone Encounter (Signed)
    Her last visit was in 09/2020 and weight was at 119 lbs then and at 123 - 125 lbs in 06/2020 so it seems she is close to her baseline but given the timeframe, not overly reliable.   If mostly just lower extremity edema but no associated dyspnea, would recommend elevation of her lower extremities and trying to limit sodium intake. If she has compression stockings, could utilize those but might be challenging to put on if by herself.   Agee with PCP evaluation for swallowing difficultly as that sounds more like a GI/Esophageal etiology.   Yes, can have labs before next week. Would check CBC, BMET and BNP.   Thanks,  Miranda Ford

## 2021-05-30 NOTE — Telephone Encounter (Addendum)
I spoke with Miranda Ford. She states she has new shoes which she noticed last week were tight on her.She says she has swelling in her feet and ankles. She cannot weigh self as she has macular degeneration. I asked her if her niece could weigh her and she says she may be able to.She denies SOB and sleeps with 2 pillows which is not new for her.   She says her Spironolactone dose is 12.5 mg daily (not QOD as we have listed). I confirmed her dose with Fredderick Phenix, PharmD at [facility] and he reports daily dose since 01/2021 by Dr.Golding.    I encouraged her to speak with Dr.Golding about her swallowing issues. She can swallow soft food w/o problems but solid foods she has to take her time to eat. She states 30 years ago she had a similar problem but ended up having teeth removed.    She has an apt 06/08/21 with B.Strader, PA-C    Update: weight is 123 lbs, she asks if she can have lab work before her visit next week's apt

## 2021-05-30 NOTE — Telephone Encounter (Signed)
Patient is concerned about her legs and feet being swollen. She said it feels like her food will not go down when she eats. She props her head up at night because she is concerned may have fluid around her heart. Currently takes amiodarone (PACERONE) 200 MG tablet, apixaban (ELIQUIS) 2.5 MG TABS tablet, atorvastatin (LIPITOR) 40 MG tablet and spironolactone (ALDACTONE) 25 MG tablet. Please call her to let her know what she should do.

## 2021-05-31 ENCOUNTER — Other Ambulatory Visit (HOSPITAL_COMMUNITY)
Admission: RE | Admit: 2021-05-31 | Discharge: 2021-05-31 | Disposition: A | Discharge status: Home / Self Care | Payer: Medicare Other | Source: Ambulatory Visit | Attending: Student | Admitting: Student

## 2021-05-31 ENCOUNTER — Other Ambulatory Visit: Payer: Self-pay

## 2021-05-31 DIAGNOSIS — I5022 Chronic systolic (congestive) heart failure: Secondary | ICD-10-CM

## 2021-05-31 DIAGNOSIS — Z79899 Other long term (current) drug therapy: Secondary | ICD-10-CM

## 2021-05-31 LAB — CBC
HCT: 39.7 % (ref 36.0–46.0)
Hemoglobin: 12.5 g/dL (ref 12.0–15.0)
MCH: 31.8 pg (ref 26.0–34.0)
MCHC: 31.5 g/dL (ref 30.0–36.0)
MCV: 101 fL — ABNORMAL HIGH (ref 80.0–100.0)
Platelets: 178 10*3/uL (ref 150–400)
RBC: 3.93 MIL/uL (ref 3.87–5.11)
RDW: 15 % (ref 11.5–15.5)
WBC: 5.6 10*3/uL (ref 4.0–10.5)
nRBC: 0 % (ref 0.0–0.2)

## 2021-05-31 LAB — BASIC METABOLIC PANEL
Anion gap: 7 (ref 5–15)
BUN: 22 mg/dL (ref 8–23)
CO2: 29 mmol/L (ref 22–32)
Calcium: 8.9 mg/dL (ref 8.9–10.3)
Chloride: 102 mmol/L (ref 98–111)
Creatinine, Ser: 1.12 mg/dL — ABNORMAL HIGH (ref 0.44–1.00)
GFR, Estimated: 46 mL/min — ABNORMAL LOW (ref >60)
Glucose, Bld: 105 mg/dL — ABNORMAL HIGH (ref 70–99)
Potassium: 5 mmol/L (ref 3.5–5.1)
Sodium: 138 mmol/L (ref 135–145)

## 2021-05-31 LAB — BRAIN NATRIURETIC PEPTIDE: B Natriuretic Peptide: 620 pg/mL — ABNORMAL HIGH (ref 0.0–100.0)

## 2021-05-31 NOTE — Telephone Encounter (Signed)
Orders placed for labs at [facility], patient aware and will continue to watch sodium intake and monitor weight.

## 2021-06-01 ENCOUNTER — Telehealth: Payer: Self-pay

## 2021-06-01 DIAGNOSIS — Z79899 Other long term (current) drug therapy: Secondary | ICD-10-CM

## 2021-06-01 DIAGNOSIS — I429 Cardiomyopathy, unspecified: Secondary | ICD-10-CM

## 2021-06-01 MED ORDER — FUROSEMIDE 20 MG PO TABS
20.0000 mg | ORAL_TABLET | Freq: Every day | ORAL | 3 refills | Status: DC
Start: 2021-06-01 — End: 2021-12-22

## 2021-06-01 NOTE — Telephone Encounter (Signed)
Pt notified and voiced understanding. Furosemide 20 mg tablets sent to the pharmacy and lab work order placed.

## 2021-06-01 NOTE — Telephone Encounter (Signed)
-----   Message from Erma Heritage, [place] sent at 06/01/2021  9:44 AM EDT ----- Please let the patient know her fluid level is elevated as normal is less than 100 and hers is at 620 (previously 273 last year). Electrolytes within a normal range and kidney function has actually improved when compared to prior labs. Hemoglobin and platelets within a normal range. Given her reports of edema and elevated fluid level, I would recommend she start taking Lasix 20mg  daily. Would not add K+ supplementation at this given that her K+ was at the higher-end of normal and she is also on Spironolactone. Will plan to recheck BMET and BNP next week at her office visit.

## 2021-06-03 ENCOUNTER — Telehealth: Payer: Self-pay | Admitting: Cardiology

## 2021-06-03 MED ORDER — SPIRONOLACTONE 25 MG PO TABS
12.5000 mg | ORAL_TABLET | Freq: Every day | ORAL | 3 refills | Status: DC
Start: 2021-06-03 — End: 2023-11-21

## 2021-06-03 NOTE — Telephone Encounter (Signed)
Pt wanted [place] to know that Dr. Hilma Favors with [facility] has increased her Spironolactone 12.5 mg (0.5) tablets daily.

## 2021-06-03 NOTE — Telephone Encounter (Signed)
New message     Miranda Ford would like a call back to go over her medication , she is confused on how she is suppose to be taking it

## 2021-06-07 NOTE — Progress Notes (Signed)
Cardiology Office Note    Date:  06/08/2021   ID:  Miranda Ford, DOB 1924-06-28, MRN [id number]  PCP:  Sharilyn Sites, MD  Cardiologist: Rozann Lesches, MD    Chief Complaint  Patient presents with   Follow-up    6 month visit    History of Present Illness:    Miranda Ford is a 87 y.o. female with past medical history of paroxysmal atrial tachycardia, chronic systolic CHF (EF 40% by echocardiogram in 2017 and conservative management pursued), Stage 3 CKD, macular degeneration and history of PE (on chronic anticoagulation with Eliquis) who presents to the office today for 11-month follow-up.   She was last examined by myself in 09/2020 and denied any recent anginal symptoms. Was still living by herself but no longer driving. Repeat imaging was not pursued as she had preferred conservative management in regards to her cardiomyopathy given her age and well-controlled symptoms. Was continued on her current medication regimen and a copy of her most recent labs was requested from her PCP.   She did call the office earlier this month reporting worsening lower extremity edema and follow-up labs showed her BNP was elevated to 620 and creatinine was stable at 1.12. She was started on Lasix 20mg  daily with plans for follow-up labs at her visit.   In talking with the patient and her niece today, she reports around the time her lower extremity edema worsened she was also having some breathing difficulty at night. Her dyspnea has improved since being started on Lasix. She still has lower extremity edema but reports symptoms have improved. Weight has overall been stable on her home scales. No reported chest pain or palpitations. She does add salt to food occasionally but denies any significant changes in her dietary intake.  Past Medical History:  Diagnosis Date   Cataracts, bilateral    Chronic systolic CHF (congestive heart failure) ([place])    a. dx 06/2016 - conservative rx recommended. EF 20%  with biventricular failure in setting of PE.   Closed fracture of left proximal humerus 07/11/2019   Demand ischemia ([place])    Epistaxis    Macular degeneration    Dry OD; wet OS   Osteoporosis    07/18/16 previously on Premarin; S/P > 10 years Alendronate   Paroxysmal atrial tachycardia ([place])    a. placed on amiodarone 06/2016.   Pulmonary emboli ([place]) 06/2016    Past Surgical History:  Procedure Laterality Date   CATARACT EXTRACTION     bilaterally   CHOLECYSTECTOMY     INTRAMEDULLARY (IM) NAIL INTERTROCHANTERIC Left 07/11/2019   Procedure: INTRAMEDULLARY (IM) NAIL INTERTROCHANTRIC;  Surgeon: Altamese Pitkin, MD;  Location: [facility];  Service: Orthopedics;  Laterality: Left;   TONSILLECTOMY      Current Medications: Outpatient Medications Prior to Visit  Medication Sig Dispense Refill   acetaminophen (TYLENOL) 325 MG tablet Take 1-2 tablets (325-650 mg total) by mouth every 6 (six) hours as needed for mild pain (pain score 1-3 or temp > 100.5). 60 tablet    amiodarone (PACERONE) 200 MG tablet TAKE (1/2) TABLET BY MOUTH ONCE DAILY. 45 tablet 3   apixaban (ELIQUIS) 2.5 MG TABS tablet Take 1 tablet (2.5 mg total) by mouth 2 (two) times daily. 60 tablet 6   atorvastatin (LIPITOR) 40 MG tablet TAKE (1) TABLET BY MOUTH AT BEDTIME. 90 tablet 3   calcium carbonate (OSCAL) 1500 (600 Ca) MG TABS tablet Take 1,500 mg by mouth daily.      carvedilol (  COREG) 3.125 MG tablet TAKE 1 TABLET BY MOUTH TWICE DAILY WITH A MEAL. (Patient taking differently: Take 3.125 mg by mouth 2 (two) times daily with a meal.) 60 tablet 5   docusate sodium (COLACE) 100 MG capsule Take 100 mg by mouth 2 (two) times daily as needed for mild constipation.     furosemide (LASIX) 20 MG tablet Take 1 tablet (20 mg total) by mouth daily. 90 tablet 3   Multiple Vitamins-Minerals (ICAPS PO) Take 1 capsule by mouth daily.      polyethylene glycol (MIRALAX MIX-IN PAX) 17 g packet Take 17 g by mouth daily. 14 each 1   spironolactone  (ALDACTONE) 25 MG tablet Take 0.5 tablets (12.5 mg total) by mouth daily. 45 tablet 3   No facility-administered medications prior to visit.     Allergies:   Codeine and Penicillins   Social History   Socioeconomic History   Marital status: Widowed    Spouse name: Not on file   Number of children: Not on file   Years of education: Not on file   Highest education level: Not on file  Occupational History   Not on file  Tobacco Use   Smoking status: Former    Pack years: 0.00    Types: Cigarettes    Quit date: 109    Years since quitting: 47.4   Smokeless tobacco: Never  Vaping Use   Vaping Use: Never used  Substance and Sexual Activity   Alcohol use: No   Drug use: No   Sexual activity: Not Currently  Other Topics Concern   Not on file  Social History Narrative   Not on file   Social Determinants of Health   Financial Resource Strain: Not on file  Food Insecurity: Not on file  Transportation Needs: Not on file  Physical Activity: Not on file  Stress: Not on file  Social Connections: Not on file     Family History:  The patient's family history includes Cancer in her sister; Diabetes in her father; Heart disease in her mother; Stroke in her mother.   Review of Systems:    Please see the history of present illness.     All other systems reviewed and are otherwise negative except as noted above.   Physical Exam:    VS:  BP 120/82   Pulse (!) 57   Ht 5\' 3"  (1.6 m)   Wt 121 lb 12.8 oz (55.2 kg)   SpO2 94%   BMI 21.58 kg/m    General: Pleasant elderly female appearing in no acute distress. Head: Normocephalic, atraumatic. Neck: No carotid bruits. JVD not elevated.  Lungs: Respirations regular and unlabored, without wheezes or rales.  Heart: Regular rate and rhythm. No S3 or S4. 2/6 SEM along RUSB Abdomen: Appears non-distended. No obvious abdominal masses. Msk:  Strength and tone appear normal for age. No obvious joint deformities or  effusions. Extremities: No clubbing or cyanosis. 1+ pitting edema with varicose veins noted.  Distal pedal pulses are 2+ bilaterally. Neuro: Alert and oriented X 3. Moves all extremities spontaneously. No focal deficits noted. Psych:  Responds to questions appropriately with a normal affect. Skin: No rashes or lesions noted  Wt Readings from Last 3 Encounters:  06/08/21 121 lb 12.8 oz (55.2 kg)  10/06/20 119 lb (54 kg)  06/28/20 123 lb 7.3 oz (56 kg)        Studies/Labs Reviewed:   EKG:  EKG is ordered today.  The ekg ordered today demonstrates sinus  bradycardia, HR 57 with PAC's and incomplete RBBB.   Recent Labs: 06/19/2020: ALT 25 05/31/2021: Hemoglobin 12.5; Platelets 178 06/08/2021: B Natriuretic Peptide 299.0; BUN 21; Creatinine, Ser 1.28; Potassium 5.1; Sodium 135   Lipid Panel    Component Value Date/Time   CHOL 141 07/04/2016 0443   TRIG 41 07/04/2016 0443   HDL 56 07/04/2016 0443   CHOLHDL 2.5 07/04/2016 0443   VLDL 8 07/04/2016 0443   LDLCALC 77 07/04/2016 0443    Additional studies/ records that were reviewed today include:   Echocardiogram: 08/2016 Study Conclusions   - Left ventricle: The cavity size was mildly dilated. Wall    thickness was normal. The estimated ejection fraction was 25%.    Diffuse hypokinesis. Doppler parameters are consistent with    abnormal left ventricular relaxation (grade 1 diastolic    dysfunction). Doppler parameters are consistent with high    ventricular filling pressure.  - Aortic valve: Probably mild to moderate aortic stenosis vs    pseudo-stenosis due to reduced left ventricular output.    Moderately thickened, moderately calcified leaflets. There was    mild regurgitation. Peak velocity (S): 195 cm/s. Mean gradient    (S): 7 mm Hg. Valve area (VTI): 1.41 cm^2. Valve area (Vmax):    1.46 cm^2. Valve area (Vmean): 1.58 cm^2.  - Aorta: Aortic root at upper normal limits in diameter. Aortic    root dimension: 39 mm (ED).   - Mitral valve: Calcified annulus. Mildly thickened, mildly    calcified leaflets . There was mild regurgitation.  - Left atrium: The atrium was moderately dilated.  - Right ventricle: Systolic function was mildly reduced.  - Tricuspid valve: There was moderate regurgitation.  - Pulmonary arteries: PA peak pressure: 44 mm Hg (S).  - Inferior vena cava: The vessel was severely dilated. The    respirophasic diameter changes were blunted (< 50%), consistent    with elevated central venous pressure.   Assessment:    1. Chronic systolic heart failure ([place])   2. Secondary cardiomyopathy ([place])   3. PAT (paroxysmal atrial tachycardia) ([place])   4. Aortic valve stenosis, etiology of cardiac valve disease unspecified   5. History of pulmonary embolism   6. Stage 3b chronic kidney disease ([place])      Plan:   In order of problems listed above:  1. HFrEF - Her EF was at 25% by echocardiogram in 2017 and she has preferred conservative management in the setting of her age. Was recently having more issues with lower extremity edema with BNP elevated to 620 and was started on Lasix 20mg  daily. She reports her edema has improved and her breathing is back to baseline. She does have 1+ pitting edema on examination and I suspect this is multifactorial in the setting of her varicose veins and CHF.  Recommended elevating her lower extremities and using compression stockings if able. We discussed possibly obtaining a repeat echocardiogram but she wishes to hold off on this for now unless her symptoms progress. Will recheck a BNP and BMET today. Continue current regimen for now with Lasix 20mg  daily, Coreg 3.125mg  BID and Spironolactone 12.5mg  daily.   2. Paroxysmal Atrial Tachycardia -She denies any recent palpitations and is in normal sinus rhythm by EKG today. She remains on Amiodarone 100 mg daily and Coreg 3.125 mg twice daily.  3. Aortic Stenosis - Prior echocardiogram in 2017 showed mild to moderate  aortic stenosis versus pseudo stenosis due to her reduced EF. She does have a murmur  on examination and has preferred conservative management in the setting of her advanced age. Would consider a repeat echocardiogram as outlined above if symptoms persist.  4. History of PE - She has been continued on anticoagulation per her PCP. Remains on Eliquis 2.5mg  BID for anticoagulation.   5. Stage 3 CKD - Baseline creatinine 1.1 - 1.3. Was at 1.12 by most recent labs on 05/31/2021. Will recheck BMET given recent initiation of Lasix.    Medication Adjustments/Labs and Tests Ordered: Current medicines are reviewed at length with the patient today.  Concerns regarding medicines are outlined above.  Medication changes, Labs and Tests ordered today are listed in the Patient Instructions below. Patient Instructions  Medication Instructions:   Continue current medication regimen. Do recommend elevating your lower extremities and using compression stockings if able.   Labwork:  BMET and BNP today.   Testing/Procedures:  None  Follow-Up:  With [place], PA-C or Dr. Domenic Polite in 6 months  Any Other Special Instructions Will Be Listed Below (If Applicable).     If you need a refill on your cardiac medications before your next appointment, please call your pharmacy.   Signed, Erma Heritage, PA-C  06/08/2021 5:06 PM    [facility] [address] [place], [place] [address] Phone: [phone] Fax: [phone]

## 2021-06-08 ENCOUNTER — Ambulatory Visit (INDEPENDENT_AMBULATORY_CARE_PROVIDER_SITE_OTHER): Payer: Medicare Other | Admitting: Student

## 2021-06-08 ENCOUNTER — Other Ambulatory Visit: Payer: Self-pay

## 2021-06-08 ENCOUNTER — Other Ambulatory Visit (HOSPITAL_COMMUNITY)
Admission: RE | Admit: 2021-06-08 | Discharge: 2021-06-08 | Disposition: A | Discharge status: Home / Self Care | Payer: Medicare Other | Source: Ambulatory Visit | Attending: Student | Admitting: Student

## 2021-06-08 ENCOUNTER — Encounter: Payer: Self-pay | Admitting: Student

## 2021-06-08 VITALS — BP 120/82 | HR 57 | Ht 63.0 in | Wt 121.8 lb

## 2021-06-08 DIAGNOSIS — I429 Cardiomyopathy, unspecified: Secondary | ICD-10-CM | POA: Diagnosis not present

## 2021-06-08 DIAGNOSIS — N1832 Chronic kidney disease, stage 3b: Secondary | ICD-10-CM | POA: Diagnosis not present

## 2021-06-08 DIAGNOSIS — Z79899 Other long term (current) drug therapy: Secondary | ICD-10-CM | POA: Insufficient documentation

## 2021-06-08 DIAGNOSIS — I471 Supraventricular tachycardia: Secondary | ICD-10-CM

## 2021-06-08 DIAGNOSIS — I5022 Chronic systolic (congestive) heart failure: Secondary | ICD-10-CM

## 2021-06-08 DIAGNOSIS — I350 Nonrheumatic aortic (valve) stenosis: Secondary | ICD-10-CM | POA: Diagnosis not present

## 2021-06-08 DIAGNOSIS — Z86711 Personal history of pulmonary embolism: Secondary | ICD-10-CM | POA: Diagnosis not present

## 2021-06-08 LAB — BASIC METABOLIC PANEL
Anion gap: 7 (ref 5–15)
BUN: 21 mg/dL (ref 8–23)
CO2: 31 mmol/L (ref 22–32)
Calcium: 8.9 mg/dL (ref 8.9–10.3)
Chloride: 97 mmol/L — ABNORMAL LOW (ref 98–111)
Creatinine, Ser: 1.28 mg/dL — ABNORMAL HIGH (ref 0.44–1.00)
GFR, Estimated: 39 mL/min — ABNORMAL LOW (ref >60)
Glucose, Bld: 95 mg/dL (ref 70–99)
Potassium: 5.1 mmol/L (ref 3.5–5.1)
Sodium: 135 mmol/L (ref 135–145)

## 2021-06-08 LAB — BRAIN NATRIURETIC PEPTIDE: B Natriuretic Peptide: 299 pg/mL — ABNORMAL HIGH (ref 0.0–100.0)

## 2021-06-08 NOTE — Patient Instructions (Signed)
Medication Instructions:   Continue current medication regimen. Do recommend elevating your lower extremities and using compression stockings if able.   Labwork:  BMET and BNP today.   Testing/Procedures:  None  Follow-Up:  With [place], PA-C or Dr. Domenic Polite in 6 months  Any Other Special Instructions Will Be Listed Below (If Applicable).     If you need a refill on your cardiac medications before your next appointment, please call your pharmacy.

## 2021-06-09 ENCOUNTER — Other Ambulatory Visit: Payer: Self-pay

## 2021-06-09 ENCOUNTER — Telehealth: Payer: Self-pay

## 2021-06-09 DIAGNOSIS — Z79899 Other long term (current) drug therapy: Secondary | ICD-10-CM

## 2021-06-09 NOTE — Telephone Encounter (Signed)
I spoke with patient.Lab results discussed and advised to avoid high potassium foods. She will repeat her bmet on 06/23/21. Results copied to pcp.

## 2021-06-09 NOTE — Telephone Encounter (Signed)
-----   Message from Erma Heritage, [place] sent at 06/08/2021  6:15 PM EDT ----- Please let the patient know her fluid level has improved as BNP was previously elevated to 620 and is now down to 299. Her K+ is at the high-end of normal so try to limit intake of K+ rich foods (bananas, tomatoes, etc.). If this remains elevated, we may need to stop her Spironolactone as this can cause higher K+ as well. Kidney function has slightly worsened but is similar to prior values over the past few years. Continue Lasix at current dosing for now. Would recommend another BMET in 2-3 weeks for reassessment given her borderline K+ levels.

## 2021-06-23 ENCOUNTER — Other Ambulatory Visit: Payer: Self-pay

## 2021-06-23 ENCOUNTER — Other Ambulatory Visit (HOSPITAL_COMMUNITY)
Admission: RE | Admit: 2021-06-23 | Discharge: 2021-06-23 | Disposition: A | Discharge status: Home / Self Care | Payer: Medicare Other | Source: Ambulatory Visit | Attending: Student | Admitting: Student

## 2021-06-23 DIAGNOSIS — N1831 Chronic kidney disease, stage 3a: Secondary | ICD-10-CM | POA: Diagnosis not present

## 2021-06-23 DIAGNOSIS — Z79899 Other long term (current) drug therapy: Secondary | ICD-10-CM | POA: Diagnosis not present

## 2021-06-23 DIAGNOSIS — I129 Hypertensive chronic kidney disease with stage 1 through stage 4 chronic kidney disease, or unspecified chronic kidney disease: Secondary | ICD-10-CM | POA: Diagnosis not present

## 2021-06-23 DIAGNOSIS — I482 Chronic atrial fibrillation, unspecified: Secondary | ICD-10-CM | POA: Diagnosis not present

## 2021-06-23 LAB — BASIC METABOLIC PANEL
Anion gap: 6 (ref 5–15)
BUN: 27 mg/dL — ABNORMAL HIGH (ref 8–23)
CO2: 30 mmol/L (ref 22–32)
Calcium: 9.3 mg/dL (ref 8.9–10.3)
Chloride: 99 mmol/L (ref 98–111)
Creatinine, Ser: 1.16 mg/dL — ABNORMAL HIGH (ref 0.44–1.00)
GFR, Estimated: 44 mL/min — ABNORMAL LOW (ref >60)
Glucose, Bld: 72 mg/dL (ref 70–99)
Potassium: 4.7 mmol/L (ref 3.5–5.1)
Sodium: 135 mmol/L (ref 135–145)

## 2021-07-18 DIAGNOSIS — Z85828 Personal history of other malignant neoplasm of skin: Secondary | ICD-10-CM | POA: Diagnosis not present

## 2021-07-18 DIAGNOSIS — Z08 Encounter for follow-up examination after completed treatment for malignant neoplasm: Secondary | ICD-10-CM | POA: Diagnosis not present

## 2021-07-18 DIAGNOSIS — C44319 Basal cell carcinoma of skin of other parts of face: Secondary | ICD-10-CM | POA: Diagnosis not present

## 2021-08-16 ENCOUNTER — Other Ambulatory Visit: Payer: Self-pay | Admitting: Cardiology

## 2021-09-03 ENCOUNTER — Other Ambulatory Visit: Payer: Self-pay | Admitting: Cardiology

## 2021-09-05 NOTE — Telephone Encounter (Signed)
Prescription refill request for Eliquis received. Indication: PE Last office visit: 06/08/21  B Strader PA-C Scr: 1.16 on 06/23/21 Age: 87 Weight: 55.2kg  Based on above findings Eliquis 2.'5mg'$  twice daily is the appropriate dose.  Refill approved.

## 2021-09-15 DIAGNOSIS — Z23 Encounter for immunization: Secondary | ICD-10-CM | POA: Diagnosis not present

## 2021-09-26 DIAGNOSIS — H353112 Nonexudative age-related macular degeneration, right eye, intermediate dry stage: Secondary | ICD-10-CM | POA: Diagnosis not present

## 2021-09-26 DIAGNOSIS — H353123 Nonexudative age-related macular degeneration, left eye, advanced atrophic without subfoveal involvement: Secondary | ICD-10-CM | POA: Diagnosis not present

## 2021-09-26 DIAGNOSIS — Z961 Presence of intraocular lens: Secondary | ICD-10-CM | POA: Diagnosis not present

## 2021-10-06 DIAGNOSIS — Z23 Encounter for immunization: Secondary | ICD-10-CM | POA: Diagnosis not present

## 2021-10-17 ENCOUNTER — Telehealth: Payer: Self-pay

## 2021-10-17 DIAGNOSIS — Z85828 Personal history of other malignant neoplasm of skin: Secondary | ICD-10-CM | POA: Diagnosis not present

## 2021-10-17 DIAGNOSIS — Z08 Encounter for follow-up examination after completed treatment for malignant neoplasm: Secondary | ICD-10-CM | POA: Diagnosis not present

## 2021-10-17 NOTE — Telephone Encounter (Signed)
Eliquis 2.5 mg samples given, #42, lot [id number], exp 05/2022

## 2021-11-04 ENCOUNTER — Emergency Department (HOSPITAL_COMMUNITY)
Admission: EM | Admit: 2021-11-04 | Discharge: 2021-11-05 | Disposition: A | Discharge status: Home / Self Care | Payer: Medicare Other | Attending: Emergency Medicine | Admitting: Emergency Medicine

## 2021-11-04 ENCOUNTER — Emergency Department (HOSPITAL_COMMUNITY): Payer: Medicare Other

## 2021-11-04 ENCOUNTER — Encounter (HOSPITAL_COMMUNITY): Payer: Self-pay | Admitting: *Deleted

## 2021-11-04 DIAGNOSIS — I110 Hypertensive heart disease with heart failure: Secondary | ICD-10-CM | POA: Diagnosis not present

## 2021-11-04 DIAGNOSIS — J441 Chronic obstructive pulmonary disease with (acute) exacerbation: Secondary | ICD-10-CM | POA: Diagnosis not present

## 2021-11-04 DIAGNOSIS — S0003XA Contusion of scalp, initial encounter: Secondary | ICD-10-CM | POA: Insufficient documentation

## 2021-11-04 DIAGNOSIS — Z87891 Personal history of nicotine dependence: Secondary | ICD-10-CM | POA: Insufficient documentation

## 2021-11-04 DIAGNOSIS — R55 Syncope and collapse: Secondary | ICD-10-CM

## 2021-11-04 DIAGNOSIS — S199XXA Unspecified injury of neck, initial encounter: Secondary | ICD-10-CM | POA: Diagnosis not present

## 2021-11-04 DIAGNOSIS — I509 Heart failure, unspecified: Secondary | ICD-10-CM | POA: Diagnosis not present

## 2021-11-04 DIAGNOSIS — I517 Cardiomegaly: Secondary | ICD-10-CM | POA: Diagnosis not present

## 2021-11-04 DIAGNOSIS — I2510 Atherosclerotic heart disease of native coronary artery without angina pectoris: Secondary | ICD-10-CM | POA: Diagnosis not present

## 2021-11-04 DIAGNOSIS — Z7901 Long term (current) use of anticoagulants: Secondary | ICD-10-CM | POA: Insufficient documentation

## 2021-11-04 DIAGNOSIS — I5022 Chronic systolic (congestive) heart failure: Secondary | ICD-10-CM | POA: Diagnosis not present

## 2021-11-04 DIAGNOSIS — S0990XA Unspecified injury of head, initial encounter: Secondary | ICD-10-CM | POA: Diagnosis present

## 2021-11-04 DIAGNOSIS — M6281 Muscle weakness (generalized): Secondary | ICD-10-CM | POA: Diagnosis not present

## 2021-11-04 DIAGNOSIS — R531 Weakness: Secondary | ICD-10-CM | POA: Diagnosis not present

## 2021-11-04 DIAGNOSIS — W01198A Fall on same level from slipping, tripping and stumbling with subsequent striking against other object, initial encounter: Secondary | ICD-10-CM | POA: Diagnosis not present

## 2021-11-04 DIAGNOSIS — Z79899 Other long term (current) drug therapy: Secondary | ICD-10-CM | POA: Diagnosis not present

## 2021-11-04 DIAGNOSIS — R42 Dizziness and giddiness: Secondary | ICD-10-CM | POA: Insufficient documentation

## 2021-11-04 DIAGNOSIS — J449 Chronic obstructive pulmonary disease, unspecified: Secondary | ICD-10-CM | POA: Diagnosis not present

## 2021-11-04 LAB — URINALYSIS, ROUTINE W REFLEX MICROSCOPIC
Bilirubin Urine: NEGATIVE
Glucose, UA: NEGATIVE mg/dL
Hgb urine dipstick: NEGATIVE
Ketones, ur: NEGATIVE mg/dL
Leukocytes,Ua: NEGATIVE
Nitrite: NEGATIVE
Protein, ur: NEGATIVE mg/dL
Specific Gravity, Urine: 1.006 (ref 1.005–1.030)
pH: 7 (ref 5.0–8.0)

## 2021-11-04 LAB — CBC WITH DIFFERENTIAL/PLATELET
Abs Immature Granulocytes: 0.02 10*3/uL (ref 0.00–0.07)
Basophils Absolute: 0 10*3/uL (ref 0.0–0.1)
Basophils Relative: 1 %
Eosinophils Absolute: 0.1 10*3/uL (ref 0.0–0.5)
Eosinophils Relative: 2 %
HCT: 40.4 % (ref 36.0–46.0)
Hemoglobin: 13.3 g/dL (ref 12.0–15.0)
Immature Granulocytes: 0 %
Lymphocytes Relative: 16 %
Lymphs Abs: 1.1 10*3/uL (ref 0.7–4.0)
MCH: 33.3 pg (ref 26.0–34.0)
MCHC: 32.9 g/dL (ref 30.0–36.0)
MCV: 101.3 fL — ABNORMAL HIGH (ref 80.0–100.0)
Monocytes Absolute: 0.8 10*3/uL (ref 0.1–1.0)
Monocytes Relative: 12 %
Neutro Abs: 4.7 10*3/uL (ref 1.7–7.7)
Neutrophils Relative %: 69 %
Platelets: 199 10*3/uL (ref 150–400)
RBC: 3.99 MIL/uL (ref 3.87–5.11)
RDW: 14.7 % (ref 11.5–15.5)
WBC: 6.8 10*3/uL (ref 4.0–10.5)
nRBC: 0 % (ref 0.0–0.2)

## 2021-11-04 LAB — COMPREHENSIVE METABOLIC PANEL
ALT: 20 U/L (ref 0–44)
AST: 25 U/L (ref 15–41)
Albumin: 3.8 g/dL (ref 3.5–5.0)
Alkaline Phosphatase: 97 U/L (ref 38–126)
Anion gap: 7 (ref 5–15)
BUN: 28 mg/dL — ABNORMAL HIGH (ref 8–23)
CO2: 28 mmol/L (ref 22–32)
Calcium: 8.8 mg/dL — ABNORMAL LOW (ref 8.9–10.3)
Chloride: 101 mmol/L (ref 98–111)
Creatinine, Ser: 1.11 mg/dL — ABNORMAL HIGH (ref 0.44–1.00)
GFR, Estimated: 46 mL/min — ABNORMAL LOW (ref >60)
Glucose, Bld: 95 mg/dL (ref 70–99)
Potassium: 4.7 mmol/L (ref 3.5–5.1)
Sodium: 136 mmol/L (ref 135–145)
Total Bilirubin: 0.8 mg/dL (ref 0.3–1.2)
Total Protein: 6.8 g/dL (ref 6.5–8.1)

## 2021-11-04 NOTE — ED Notes (Signed)
Patient transported to CT 

## 2021-11-04 NOTE — Discharge Instructions (Signed)
The testing today did not show any serious problems with your heart or other bodily functions.  Follow-up with your primary care doctor or cardiologist for further evaluation of your overall health status and cardiac function.  Make sure you are getting plenty of rest and drinking a lot of fluids.  Return here if needed.

## 2021-11-04 NOTE — ED Provider Notes (Signed)
[facility] Provider Note   CSN: [id number] Arrival date & time: 11/04/21  1842     History Chief Complaint  Patient presents with   Miranda Ford    Miranda Ford is a 87 y.o. female.  HPI She is here for evaluation of a sensation of lightheadedness and feeling like she will faint, ever since she had a fall with syncope, 7 days ago.  She is here with her niece.  She has been doing her usual activities at home including housekeeping, fixing meals and day-to-day activities but feels like she is worsening so decided to come here tonight.  In the fall a week ago she injured her elbows but they have improved.  She complains of a persistent "knot," on her posterior scalp.  She denies neck pain, back pain, chest pain, shortness of breath, focal weakness or paresthesia.  There are no other known active modifying factors.    Past Medical History:  Diagnosis Date   Cataracts, bilateral    Chronic systolic CHF (congestive heart failure) ([facility])    a. dx 06/2016 - conservative rx recommended. EF 20% with biventricular failure in setting of PE.   Closed fracture of left proximal humerus 07/11/2019   Demand ischemia ([facility])    Epistaxis    Macular degeneration    Dry OD; wet OS   Osteoporosis    07/18/16 previously on Premarin; S/P > 10 years Alendronate   Paroxysmal atrial tachycardia ([place])    a. placed on amiodarone 06/2016.   Pulmonary emboli ([place]) 06/2016    Patient Active Problem List   Diagnosis Date Noted   Advanced nonexudative age-related macular degeneration of left eye with subfoveal involvement 02/14/2021   Advanced nonexudative age-related macular degeneration of right eye without subfoveal involvement 02/14/2021   Posterior vitreous detachment of both eyes 02/14/2021   Closed fracture of left proximal humerus 07/11/2019   Closed left hip fracture, initial encounter ([facility]) 85/46/2703   Chronic systolic CHF (congestive heart failure) ([facility])    History of pulmonary  embolism    Bradycardia    Senile osteoporosis 07/18/2016   DNR (do not resuscitate) discussion    Palliative care encounter    Epistaxis 50/08/3817   Acute systolic CHF (congestive heart failure) ([facility]) 07/05/2016   Acute respiratory failure with hypoxia ([facility]) 07/05/2016   Secondary cardiomyopathy (H[facility])    Biventricular failure (HCC)    Atrial tachycardia ([facility])    Paroxysmal atrial tachycardia ([place])    Demand ischemia (H[facility])    Acute pulmonary embolism ([place]) 07/03/2016   COPD exacerbation ([facility]) 07/03/2016   Cardiomegaly 07/03/2016   Coronary atherosclerosis of native coronary artery 07/03/2016   Elevated troponin     Past Surgical History:  Procedure Laterality Date   CATARACT EXTRACTION     bilaterally   CHOLECYSTECTOMY     INTRAMEDULLARY (IM) NAIL INTERTROCHANTERIC Left 07/11/2019   Procedure: INTRAMEDULLARY (IM) NAIL INTERTROCHANTRIC;  Surgeon: Altamese [facility], MD;  Location: [facility];  Service: Orthopedics;  Laterality: Left;   TONSILLECTOMY       OB History     Gravida  0   Para      Term      Preterm      AB      Living         SAB      IAB      Ectopic      Multiple      Live Births  Family History  Problem Relation Age of Onset   Heart disease Mother    Stroke Mother    Diabetes Father    Cancer Sister        1 sister with metastatic bone cancer; 1 with breast cancer    Social History   Tobacco Use   Smoking status: Former    Types: Cigarettes    Quit date: 1975    Years since quitting: 47.8   Smokeless tobacco: Never  Vaping Use   Vaping Use: Never used  Substance Use Topics   Alcohol use: No   Drug use: No    Home Medications Prior to Admission medications   Medication Sig Start Date End Date Taking? Authorizing Provider  acetaminophen (TYLENOL) 325 MG tablet Take 1-2 tablets (325-650 mg total) by mouth every 6 (six) hours as needed for mild pain (pain score 1-3 or temp > 100.5). 07/14/19   Ainsley Spinner, PA-C   amiodarone (PACERONE) 200 MG tablet TAKE (1/2) TABLET BY MOUTH ONCE DAILY. 09/05/21   Satira Sark, MD  apixaban (ELIQUIS) 2.5 MG TABS tablet TAKE 1 TABLET BY MOUTH AT 10AM AND 1 TABLET AT 9PM. 09/05/21   Arnoldo Lenis, MD  atorvastatin (LIPITOR) 40 MG tablet TAKE (1) TABLET BY MOUTH AT BEDTIME. 08/16/21   Satira Sark, MD  calcium carbonate (OSCAL) 1500 (600 Ca) MG TABS tablet Take 1,500 mg by mouth daily.     [provider]  carvedilol (COREG) 3.125 MG tablet TAKE 1 TABLET BY MOUTH TWICE DAILY WITH A MEAL. Patient taking differently: Take 3.125 mg by mouth 2 (two) times daily with a meal. 04/25/18   Lendon Colonel, NP  docusate sodium (COLACE) 100 MG capsule Take 100 mg by mouth 2 (two) times daily as needed for mild constipation.    [provider]  furosemide (LASIX) 20 MG tablet Take 1 tablet (20 mg total) by mouth daily. 06/01/21 08/30/21  Strader, Fransisco Hertz, PA-C  Multiple Vitamins-Minerals (ICAPS PO) Take 1 capsule by mouth daily.     [provider]  polyethylene glycol (MIRALAX MIX-IN PAX) 17 g packet Take 17 g by mouth daily. 06/28/20   Fredia Sorrow, MD  spironolactone (ALDACTONE) 25 MG tablet Take 0.5 tablets (12.5 mg total) by mouth daily. 06/03/21 09/01/21  Erma Heritage, PA-C    Allergies    Codeine and Penicillins  Review of Systems   Review of Systems  All other systems reviewed and are negative.  Physical Exam Updated Vital Signs BP (!) 148/76   Pulse 63   Temp 98.4 F (36.9 C) (Oral)   Resp 19   SpO2 93%   Physical Exam Vitals and nursing note reviewed.  Constitutional:      General: She is not in acute distress.    Appearance: She is well-developed. She is not ill-appearing, toxic-appearing or diaphoretic.  HENT:     Head: Normocephalic.     Comments: Small contusion, left occiput.  No associated bleeding or drainage. Eyes:     Conjunctiva/sclera: Conjunctivae normal.     Pupils: Pupils are equal, round, and  reactive to light.  Neck:     Trachea: Phonation normal.  Cardiovascular:     Rate and Rhythm: Normal rate and regular rhythm.  Pulmonary:     Effort: Pulmonary effort is normal.     Breath sounds: Normal breath sounds.  Chest:     Chest wall: No tenderness.  Abdominal:     General: There is no  distension.     Palpations: Abdomen is soft.     Tenderness: There is no abdominal tenderness. There is no guarding.  Musculoskeletal:        General: Normal range of motion.     Cervical back: Normal range of motion and neck supple.  Skin:    General: Skin is warm and dry.  Neurological:     Mental Status: She is alert and oriented to person, place, and time.     Motor: No abnormal muscle tone.     Comments: No dysarthria or aphasia.  Normal strength arms and legs bilaterally.  Psychiatric:        Mood and Affect: Mood normal.        Behavior: Behavior normal.        Thought Content: Thought content normal.        Judgment: Judgment normal.    ED Results / Procedures / Treatments   Labs (all labs ordered are listed, but only abnormal results are displayed) Labs Reviewed  COMPREHENSIVE METABOLIC PANEL - Abnormal; Notable for the following components:      Result Value   BUN 28 (*)    Creatinine, Ser 1.11 (*)    Calcium 8.8 (*)    GFR, Estimated 46 (*)    All other components within normal limits  CBC WITH DIFFERENTIAL/PLATELET - Abnormal; Notable for the following components:   MCV 101.3 (*)    All other components within normal limits  URINALYSIS, ROUTINE W REFLEX MICROSCOPIC    EKG EKG Interpretation  Date/Time:  Friday November 04 2021 20:19:11 EST Ventricular Rate:  57 PR Interval:  195 QRS Duration: 128 QT Interval:  486 QTC Calculation: 474 R Axis:   90 Text Interpretation: Sinus rhythm Atrial premature complex RBBB and LPFB since last tracing no significant change Confirmed by Daleen Bo [phone]) on 11/04/2021 9:11:57 PM  Radiology DG Chest 2 View  Result  Date: 11/04/2021 CLINICAL DATA:  Fall, CHF, weakness EXAM: CHEST - 2 VIEW COMPARISON:  None. FINDINGS: The lungs are hyperinflated in keeping with changes of underlying COPD. The lungs are clear. No pneumothorax or pleural effusion. Cardiac size is mildly enlarged, unchanged. Pulmonary vascularity is normal. No acute bone abnormality. Osseous structures are age appropriate. IMPRESSION: No active cardiopulmonary disease. COPD. Stable cardiomegaly. Electronically Signed   By: Fidela Salisbury M.D.   On: 11/04/2021 21:34   CT Head Wo Contrast  Result Date: 11/04/2021 CLINICAL DATA:  Polytrauma, critical, head/C-spine injury suspected. Fall 1 week prior with persistent generalized weakness, presyncope EXAM: CT HEAD WITHOUT CONTRAST CT CERVICAL SPINE WITHOUT CONTRAST TECHNIQUE: Multidetector CT imaging of the head and cervical spine was performed following the standard protocol without intravenous contrast. Multiplanar CT image reconstructions of the cervical spine were also generated. COMPARISON:  None. FINDINGS: CT HEAD FINDINGS Brain: Normal anatomic configuration. Parenchymal volume loss is relatively mild given the patient's age. Mild periventricular white matter changes are present likely reflecting the sequela of small vessel ischemia. No abnormal intra or extra-axial mass lesion or fluid collection. No abnormal mass effect or midline shift. No evidence of acute intracranial hemorrhage or infarct. Ventricular size is normal. Cerebellum unremarkable. Vascular: No asymmetric hyperdense vasculature at the skull base. Moderate atherosclerotic calcification noted within the carotid siphons. Skull: Intact Sinuses/Orbits: Paranasal sinuses are clear. Ocular lenses have been removed. Orbits are otherwise unremarkable. Other: Mastoid air cells and middle ear cavities are clear. Mild right parietal scalp soft tissue swelling noted. CT CERVICAL SPINE FINDINGS Alignment: Normal. Skull  base and vertebrae: Craniocervical  alignment is normal. The atlantodental interval is not widened. No acute fracture of the cervical spine. Vertebral body height is preserved. Soft tissues and spinal canal: No prevertebral fluid or swelling. No visible canal hematoma. Disc levels: There is intervertebral disc space narrowing and endplate remodeling of B1-Y7 in keeping with changes of mild to moderate degenerative disc disease, most severe at C5-6. The prevertebral soft tissues are not thickened on sagittal reformats. Review of the axial images demonstrates multilevel uncovertebral and facet arthrosis resulting in mild right neuroforaminal narrowing at C5-6. Remaining neural foramina appear widely patent. The spinal canal is widely patent. Upper chest: At least mild emphysematous changes are noted the visualized lung apices. Other: None IMPRESSION: No acute intracranial injury. No calvarial fracture. Mild right parietal scalp soft tissue swelling. No acute fracture or listhesis of the cervical spine. Emphysema (ICD10-J43.9). Electronically Signed   By: Fidela Salisbury M.D.   On: 11/04/2021 21:52   CT Cervical Spine Wo Contrast  Result Date: 11/04/2021 CLINICAL DATA:  Polytrauma, critical, head/C-spine injury suspected. Fall 1 week prior with persistent generalized weakness, presyncope EXAM: CT HEAD WITHOUT CONTRAST CT CERVICAL SPINE WITHOUT CONTRAST TECHNIQUE: Multidetector CT imaging of the head and cervical spine was performed following the standard protocol without intravenous contrast. Multiplanar CT image reconstructions of the cervical spine were also generated. COMPARISON:  None. FINDINGS: CT HEAD FINDINGS Brain: Normal anatomic configuration. Parenchymal volume loss is relatively mild given the patient's age. Mild periventricular white matter changes are present likely reflecting the sequela of small vessel ischemia. No abnormal intra or extra-axial mass lesion or fluid collection. No abnormal mass effect or midline shift. No evidence of  acute intracranial hemorrhage or infarct. Ventricular size is normal. Cerebellum unremarkable. Vascular: No asymmetric hyperdense vasculature at the skull base. Moderate atherosclerotic calcification noted within the carotid siphons. Skull: Intact Sinuses/Orbits: Paranasal sinuses are clear. Ocular lenses have been removed. Orbits are otherwise unremarkable. Other: Mastoid air cells and middle ear cavities are clear. Mild right parietal scalp soft tissue swelling noted. CT CERVICAL SPINE FINDINGS Alignment: Normal. Skull base and vertebrae: Craniocervical alignment is normal. The atlantodental interval is not widened. No acute fracture of the cervical spine. Vertebral body height is preserved. Soft tissues and spinal canal: No prevertebral fluid or swelling. No visible canal hematoma. Disc levels: There is intervertebral disc space narrowing and endplate remodeling of W2-N5 in keeping with changes of mild to moderate degenerative disc disease, most severe at C5-6. The prevertebral soft tissues are not thickened on sagittal reformats. Review of the axial images demonstrates multilevel uncovertebral and facet arthrosis resulting in mild right neuroforaminal narrowing at C5-6. Remaining neural foramina appear widely patent. The spinal canal is widely patent. Upper chest: At least mild emphysematous changes are noted the visualized lung apices. Other: None IMPRESSION: No acute intracranial injury. No calvarial fracture. Mild right parietal scalp soft tissue swelling. No acute fracture or listhesis of the cervical spine. Emphysema (ICD10-J43.9). Electronically Signed   By: Fidela Salisbury M.D.   On: 11/04/2021 21:52    Procedures Procedures   Medications Ordered in ED Medications - No data to display  ED Course  I have reviewed the triage vital signs and the nursing notes.  Pertinent labs & imaging results that were available during my care of the patient were reviewed by me and considered in my medical  decision making (see chart for details).    MDM Rules/Calculators/A&P  Patient Vitals for the past 24 hrs:  BP Temp Temp src Pulse Resp SpO2  11/05/21 0003 -- 98.4 F (36.9 C) Oral -- -- --  11/04/21 2355 (!) 148/76 -- -- 63 19 93 %  11/04/21 2330 (!) 155/74 -- -- 67 15 95 %  11/04/21 2300 (!) 168/84 -- -- 70 17 93 %  11/04/21 2230 138/76 -- -- 74 13 91 %  11/04/21 2120 (!) 147/84 -- -- (!) 58 17 97 %  11/04/21 2055 (!) 181/85 -- -- 68 (!) 22 96 %  11/04/21 2001 137/78 98.2 F (36.8 C) Oral 62 18 95 %    At the time of discharge-reevaluation with update and discussion. After initial assessment and treatment, an updated evaluation reveals she is calm and comfortable.  Findings discussed with patient and niece who is with her, and all questions were answered. Daleen Bo   Medical Decision Making:  This patient is presenting for evaluation of a sensation of near syncope after a fall 1 week ago, which does require a range of treatment options, and is a complaint that involves a moderate risk of morbidity and mortality. The differential diagnoses include hemodynamic instability, acute illness including metabolic disorders and infectious processes.  Also consider CNS abnormalities. I decided to review old records, and in summary advanced elderly female with a history of PE, on anticoagulant medication, history of COPD, history of coronary artery disease, history of cardiomyopathy and biventricular failure..  I obtained additional historical information from niece at bedside.  Clinical Laboratory Tests Ordered, included CBC, Metabolic panel, and Urinalysis. Review indicates normal except mild elevation BUN and creatinine, calcium low, MCV high. Radiologic Tests Ordered, included CT head, CT cervical spine, chest x-ray.  I independently Visualized: Radiographic images, which show no acute abnormalities, degenerative joint disease of the cervical spine  Cardiac  Monitor Tracing which shows normal sinus rhythm    I ordered and Reviewed the orthostatic blood pressure and pulse medicine Test.  No orthostatic changes.  Critical Interventions-clinical evaluation, laboratory testing, radiography, observation and reassessment  After These Interventions, the Patient was reevaluated and was found stable for discharge with nonspecific symptoms following fall, 1 week prior.  She has ongoing sensation of syncope without evidence for acute cardiovascular instability.  Possible arrhythmias, possible debilitated state versus progressive dementia.  No indication for hospitalization at this time.  Defer further evaluation to PCP in the outpatient setting.  CRITICAL CARE-no Performed by: Daleen Bo  Nursing Notes Reviewed/ Care Coordinated Applicable Imaging Reviewed Interpretation of Laboratory Data incorporated into ED treatment  The patient appears reasonably screened and/or stabilized for discharge and I doubt any other medical condition or other [facility] requiring further screening, evaluation, or treatment in the ED at this time prior to discharge.  Plan: Home Medications-continue usual; Home Treatments-rest, fluids; return here if the recommended treatment, does not improve the symptoms; Recommended follow up-PCP, as needed, and checkup in 1 week     Final Clinical Impression(s) / ED Diagnoses Final diagnoses:  Near syncope    Rx / DC Orders ED Discharge Orders     None        Daleen Bo, MD 11/05/21 1247

## 2021-11-04 NOTE — ED Triage Notes (Signed)
[address] and hit head a week ago, states she feels weak and had near syncopal episode, feels weak when trying to walk

## 2021-11-09 ENCOUNTER — Telehealth: Payer: Self-pay | Admitting: Student

## 2021-11-09 NOTE — Telephone Encounter (Signed)
New Message:    Patient called and said she had been seen in the ER on 11-04-21. She wanted a follow up appointment as soon as she could get one. She have an appointment on 12-22-21, she thought she need an appointment. She thinks it might be some of her medicine that caused the Syncope episode.

## 2021-11-09 NOTE — Telephone Encounter (Signed)
Informed pt that there were no available provider appt's in [place] until 12/27, however, there was one appt at the [facility] office for 11/17. Pt decided to keep 12/29 appt with B. Strader, PA-C.

## 2021-11-15 DIAGNOSIS — I509 Heart failure, unspecified: Secondary | ICD-10-CM | POA: Diagnosis not present

## 2021-11-15 DIAGNOSIS — Z6820 Body mass index (BMI) 20.0-20.9, adult: Secondary | ICD-10-CM | POA: Diagnosis not present

## 2021-11-15 DIAGNOSIS — R55 Syncope and collapse: Secondary | ICD-10-CM | POA: Diagnosis not present

## 2021-12-07 DIAGNOSIS — Z1331 Encounter for screening for depression: Secondary | ICD-10-CM | POA: Diagnosis not present

## 2021-12-07 DIAGNOSIS — M810 Age-related osteoporosis without current pathological fracture: Secondary | ICD-10-CM | POA: Diagnosis not present

## 2021-12-07 DIAGNOSIS — I2699 Other pulmonary embolism without acute cor pulmonale: Secondary | ICD-10-CM | POA: Diagnosis not present

## 2021-12-07 DIAGNOSIS — Z0000 Encounter for general adult medical examination without abnormal findings: Secondary | ICD-10-CM | POA: Diagnosis not present

## 2021-12-07 DIAGNOSIS — R7309 Other abnormal glucose: Secondary | ICD-10-CM | POA: Diagnosis not present

## 2021-12-07 DIAGNOSIS — E7849 Other hyperlipidemia: Secondary | ICD-10-CM | POA: Diagnosis not present

## 2021-12-07 DIAGNOSIS — E782 Mixed hyperlipidemia: Secondary | ICD-10-CM | POA: Diagnosis not present

## 2021-12-07 DIAGNOSIS — I509 Heart failure, unspecified: Secondary | ICD-10-CM | POA: Diagnosis not present

## 2021-12-07 DIAGNOSIS — I482 Chronic atrial fibrillation, unspecified: Secondary | ICD-10-CM | POA: Diagnosis not present

## 2021-12-07 DIAGNOSIS — I10 Essential (primary) hypertension: Secondary | ICD-10-CM | POA: Diagnosis not present

## 2021-12-07 DIAGNOSIS — Z6820 Body mass index (BMI) 20.0-20.9, adult: Secondary | ICD-10-CM | POA: Diagnosis not present

## 2021-12-07 DIAGNOSIS — E559 Vitamin D deficiency, unspecified: Secondary | ICD-10-CM | POA: Diagnosis not present

## 2021-12-07 DIAGNOSIS — I517 Cardiomegaly: Secondary | ICD-10-CM | POA: Diagnosis not present

## 2021-12-21 NOTE — Progress Notes (Signed)
Cardiology Office Note    Date:  12/22/2021   ID:  Miranda Ford, DOB 04-16-1924, MRN [id number]  PCP:  Sharilyn Sites, MD  Cardiologist: Rozann Lesches, MD    Chief Complaint  Patient presents with   Follow-up    6 month visit    History of Present Illness:    Miranda Ford is a 87 y.o. female with past medical history of paroxysmal atrial tachycardia, HFrEF (EF 25% by echocardiogram in 2017 and conservative management pursued), aortic stenosis, Stage 3 CKD, macular degeneration and history of PE (on chronic anticoagulation with Eliquis) who presents to the office today for 32-month follow-up.   She was last examined by myself in 05/2021 and had recently been started on Lasix 20mg  daily due to lower extremity edema with improvement in her symptoms. Obtaining a repeat echocardiogram was reviewed with the patient but she wished to hold off on additional testing.   In the interim, she was evaluated in the ED in 10/2021 and reported having a recent syncopal episode. Labs showed no acute abnormalities and telemetry was unrevealing by review of notes. CT Head showed no acute intracranial abnormalities and she was discharged home with outpatient follow-up recommended.   In talking with the patient and her niece today, she reports overall feeling well since her ER evaluation last month. Says that she had changed positions quickly the day that she had a syncopal episode and thinks this might have triggered the event. Unaware of any precipitating symptoms. Denies any recurrent presyncope or syncopal episodes since. No recent chest pain, palpitations, orthopnea or PND. Was previously having intermittent lower extremity edema earlier this year but reports this has now resolved and she only takes Lasix if needed and has not utilized this in several months. Feels that she stays well-hydrated as she consumes water and juice throughout the day. Still lives by herself and performs ADL's  independently.  Past Medical History:  Diagnosis Date   Cataracts, bilateral    Chronic systolic CHF (congestive heart failure) ([place])    a. dx 06/2016 - conservative rx recommended. EF 20% with biventricular failure in setting of PE.   Closed fracture of left proximal humerus 07/11/2019   Demand ischemia ([place])    Epistaxis    Macular degeneration    Dry OD; wet OS   Osteoporosis    07/18/16 previously on Premarin; S/P > 10 years Alendronate   Paroxysmal atrial tachycardia ([place])    a. placed on amiodarone 06/2016.   Pulmonary emboli ([place]) 06/2016    Past Surgical History:  Procedure Laterality Date   CATARACT EXTRACTION     bilaterally   CHOLECYSTECTOMY     INTRAMEDULLARY (IM) NAIL INTERTROCHANTERIC Left 07/11/2019   Procedure: INTRAMEDULLARY (IM) NAIL INTERTROCHANTRIC;  Surgeon: Altamese Coldstream, MD;  Location: [facility];  Service: Orthopedics;  Laterality: Left;   TONSILLECTOMY      Current Medications: Outpatient Medications Prior to Visit  Medication Sig Dispense Refill   acetaminophen (TYLENOL) 325 MG tablet Take 1-2 tablets (325-650 mg total) by mouth every 6 (six) hours as needed for mild pain (pain score 1-3 or temp > 100.5). 60 tablet    amiodarone (PACERONE) 200 MG tablet TAKE (1/2) TABLET BY MOUTH ONCE DAILY. 45 tablet 3   apixaban (ELIQUIS) 2.5 MG TABS tablet TAKE 1 TABLET BY MOUTH AT 10AM AND 1 TABLET AT 9PM. 60 tablet 5   atorvastatin (LIPITOR) 40 MG tablet TAKE (1) TABLET BY MOUTH AT BEDTIME. 90 tablet 3  calcium carbonate (OSCAL) 1500 (600 Ca) MG TABS tablet Take 1,500 mg by mouth daily.      carvedilol (COREG) 3.125 MG tablet TAKE 1 TABLET BY MOUTH TWICE DAILY WITH A MEAL. (Patient taking differently: Take 3.125 mg by mouth 2 (two) times daily with a meal.) 60 tablet 5   docusate sodium (COLACE) 100 MG capsule Take 100 mg by mouth 2 (two) times daily as needed for mild constipation.     Multiple Vitamins-Minerals (ICAPS PO) Take 1 capsule by mouth daily.       polyethylene glycol (MIRALAX MIX-IN PAX) 17 g packet Take 17 g by mouth daily. 14 each 1   furosemide (LASIX) 20 MG tablet Take 1 tablet (20 mg total) by mouth daily. 90 tablet 3   spironolactone (ALDACTONE) 25 MG tablet Take 0.5 tablets (12.5 mg total) by mouth daily. 45 tablet 3   No facility-administered medications prior to visit.     Allergies:   Codeine and Penicillins   Social History   Socioeconomic History   Marital status: Widowed    Spouse name: Not on file   Number of children: Not on file   Years of education: Not on file   Highest education level: Not on file  Occupational History   Not on file  Tobacco Use   Smoking status: Former    Types: Cigarettes    Quit date: 32    Years since quitting: 48.0   Smokeless tobacco: Never  Vaping Use   Vaping Use: Never used  Substance and Sexual Activity   Alcohol use: No   Drug use: No   Sexual activity: Not Currently  Other Topics Concern   Not on file  Social History Narrative   Not on file   Social Determinants of Health   Financial Resource Strain: Not on file  Food Insecurity: Not on file  Transportation Needs: Not on file  Physical Activity: Not on file  Stress: Not on file  Social Connections: Not on file     Family History:  The patient's family history includes Cancer in her sister; Diabetes in her father; Heart disease in her mother; Stroke in her mother.   Review of Systems:    Please see the history of present illness.     All other systems reviewed and are otherwise negative except as noted above.   Physical Exam:    VS:  BP (!) 142/76    Pulse 60    Wt 117 lb (53.1 kg)    SpO2 94%    BMI 20.73 kg/m    General: Thin elderly female appearing in no acute distress. Head: Normocephalic, atraumatic. Neck: No carotid bruits. JVD not elevated.  Lungs: Respirations regular and unlabored, without wheezes or rales.  Heart: Regular rate and rhythm. No S3 or S4.  2/6 SEM along RUSB.  Abdomen:  Appears non-distended. No obvious abdominal masses. Msk:  Strength and tone appear normal for age. No obvious joint deformities or effusions. Extremities: No clubbing or cyanosis. Trace ankle edema bilaterally. Varicose veins present.  Distal pedal pulses are 2+ bilaterally. Neuro: Alert and oriented X 3. Moves all extremities spontaneously. No focal deficits noted. Psych:  Responds to questions appropriately with a normal affect. Skin: No rashes or lesions noted  Wt Readings from Last 3 Encounters:  12/22/21 117 lb (53.1 kg)  06/08/21 121 lb 12.8 oz (55.2 kg)  10/06/20 119 lb (54 kg)     Studies/Labs Reviewed:   EKG:  EKG is not ordered  today.   Recent Labs: 06/08/2021: B Natriuretic Peptide 299.0 11/04/2021: ALT 20; BUN 28; Creatinine, Ser 1.11; Hemoglobin 13.3; Platelets 199; Potassium 4.7; Sodium 136   Lipid Panel    Component Value Date/Time   CHOL 141 07/04/2016 0443   TRIG 41 07/04/2016 0443   HDL 56 07/04/2016 0443   CHOLHDL 2.5 07/04/2016 0443   VLDL 8 07/04/2016 0443   LDLCALC 77 07/04/2016 0443    Additional studies/ records that were reviewed today include:   Echocardiogram: 08/2016 Study Conclusions   - Left ventricle: The cavity size was mildly dilated. Wall    thickness was normal. The estimated ejection fraction was 25%.    Diffuse hypokinesis. Doppler parameters are consistent with    abnormal left ventricular relaxation (grade 1 diastolic    dysfunction). Doppler parameters are consistent with high    ventricular filling pressure.  - Aortic valve: Probably mild to moderate aortic stenosis vs    pseudo-stenosis due to reduced left ventricular output.    Moderately thickened, moderately calcified leaflets. There was    mild regurgitation. Peak velocity (S): 195 cm/s. Mean gradient    (S): 7 mm Hg. Valve area (VTI): 1.41 cm^2. Valve area (Vmax):    1.46 cm^2. Valve area (Vmean): 1.58 cm^2.  - Aorta: Aortic root at upper normal limits in diameter. Aortic     root dimension: 39 mm (ED).  - Mitral valve: Calcified annulus. Mildly thickened, mildly    calcified leaflets . There was mild regurgitation.  - Left atrium: The atrium was moderately dilated.  - Right ventricle: Systolic function was mildly reduced.  - Tricuspid valve: There was moderate regurgitation.  - Pulmonary arteries: PA peak pressure: 44 mm Hg (S).  - Inferior vena cava: The vessel was severely dilated. The    respirophasic diameter changes were blunted (< 50%), consistent    with elevated central venous pressure.   Assessment:    1. Chronic systolic heart failure ([place])   2. Syncope, unspecified syncope type   3. Aortic valve stenosis, etiology of cardiac valve disease unspecified   4. PAT (paroxysmal atrial tachycardia) ([place])   5. History of pulmonary embolism      Plan:   In order of problems listed above:  1. HFrEF - Her EF was at 25% by echocardiogram in 2017 and she has declined repeat imaging in the setting of her advanced age. We discussed a possible repeat echocardiogram today given her recent syncopal episode and she does not wish to pursue further testing. - She denies any recent orthopnea, PND or pitting edema and appears euvolemic by examination today. Will continue her current medication regimen with Coreg 3.125 mg twice daily, Spironolactone 12.5 mg daily and Lasix 20 mg PRN.  Her creatinine was stable at 1.11 when checked on 11/04/2021.  2. Syncopal Episode - This occurred last month and the work-up during her Emergency Department visit was unrevealing at that time. She possibly had an orthostatic event as her syncopal episode occurred with a quick change in position. Orthostatics were checked today and negative. Discussed a repeat echocardiogram as outlined above and she continues to decline further testing at this time. Will continue with Lasix only on a PRN basis.   3. Aortic Stenosis - This was mild to moderate by echocardiogram in 2017 and remains in  a moderate range by examination today.  4. Paroxysmal Atrial Tachycardia - She denies any recent palpitations and is in normal sinus rhythm today. She remains on Amiodarone 100 mg daily  along with Coreg 3.125 mg twice daily.  5. Prior PE - She has been continued on lifelong anticoagulation per her PCP and remains on Eliquis 2.5 mg twice daily for anticoagulation which is the appropriate dose given her age of 13 and weight at 117 lbs.    Medication Adjustments/Labs and Tests Ordered: Current medicines are reviewed at length with the patient today.  Concerns regarding medicines are outlined above.  Medication changes, Labs and Tests ordered today are listed in the Patient Instructions below. Patient Instructions  Medication Instructions:  Your physician recommends that you continue on your current medications as directed. Please refer to the Current Medication list given to you today.  Sample Eliquis 2.5 mg #28, lot [id number], exp 08/2022  Labwork: none  Testing/Procedures: none  Follow-Up: 6 months  Any Other Special Instructions Will Be Listed Below (If Applicable).  If you need a refill on your cardiac medications before your next appointment, please call your pharmacy.    Signed, Erma Heritage, PA-C  12/22/2021 5:19 PM    [facility] [address] [place], [place] [address] Phone: [phone] Fax: [phone]

## 2021-12-22 ENCOUNTER — Ambulatory Visit (INDEPENDENT_AMBULATORY_CARE_PROVIDER_SITE_OTHER): Payer: Medicare Other | Admitting: Student

## 2021-12-22 ENCOUNTER — Encounter: Payer: Self-pay | Admitting: Student

## 2021-12-22 ENCOUNTER — Other Ambulatory Visit: Payer: Self-pay

## 2021-12-22 VITALS — BP 142/76 | HR 60 | Wt 117.0 lb

## 2021-12-22 DIAGNOSIS — I5022 Chronic systolic (congestive) heart failure: Secondary | ICD-10-CM | POA: Diagnosis not present

## 2021-12-22 DIAGNOSIS — Z86711 Personal history of pulmonary embolism: Secondary | ICD-10-CM

## 2021-12-22 DIAGNOSIS — I350 Nonrheumatic aortic (valve) stenosis: Secondary | ICD-10-CM | POA: Diagnosis not present

## 2021-12-22 DIAGNOSIS — I471 Supraventricular tachycardia: Secondary | ICD-10-CM

## 2021-12-22 DIAGNOSIS — R55 Syncope and collapse: Secondary | ICD-10-CM

## 2021-12-22 MED ORDER — FUROSEMIDE 20 MG PO TABS: 20.0000 mg | ORAL_TABLET | Freq: Every day | ORAL | 3 refills | Status: DC | PRN

## 2021-12-22 NOTE — Patient Instructions (Signed)
Medication Instructions:  Your physician recommends that you continue on your current medications as directed. Please refer to the Current Medication list given to you today.  Sample Eliquis 2.5 mg #28, lot [id number], exp 08/2022  Labwork: none  Testing/Procedures: none  Follow-Up: 6 months  Any Other Special Instructions Will Be Listed Below (If Applicable).  If you need a refill on your cardiac medications before your next appointment, please call your pharmacy.

## 2022-01-16 DIAGNOSIS — Z08 Encounter for follow-up examination after completed treatment for malignant neoplasm: Secondary | ICD-10-CM | POA: Diagnosis not present

## 2022-01-16 DIAGNOSIS — Z85828 Personal history of other malignant neoplasm of skin: Secondary | ICD-10-CM | POA: Diagnosis not present

## 2022-02-20 ENCOUNTER — Other Ambulatory Visit: Payer: Self-pay

## 2022-02-20 ENCOUNTER — Ambulatory Visit (INDEPENDENT_AMBULATORY_CARE_PROVIDER_SITE_OTHER): Payer: Medicare Other | Admitting: Ophthalmology

## 2022-02-20 ENCOUNTER — Encounter (INDEPENDENT_AMBULATORY_CARE_PROVIDER_SITE_OTHER): Payer: Self-pay | Admitting: Ophthalmology

## 2022-02-20 DIAGNOSIS — H353113 Nonexudative age-related macular degeneration, right eye, advanced atrophic without subfoveal involvement: Secondary | ICD-10-CM

## 2022-02-20 DIAGNOSIS — H353124 Nonexudative age-related macular degeneration, left eye, advanced atrophic with subfoveal involvement: Secondary | ICD-10-CM

## 2022-02-20 NOTE — Assessment & Plan Note (Signed)
Encroachment of geographic atrophy in and around the [place], very small island of foveal RPE remains OD

## 2022-02-20 NOTE — Progress Notes (Signed)
02/20/2022     CHIEF COMPLAINT Patient presents for  Chief Complaint  Patient presents with   Macular Degeneration      HISTORY OF PRESENT ILLNESS: Miranda Ford is a 88 y.o. female who presents to the clinic today for:   HPI   1 yr fu ou oct fp. Patient states vision is stable and unchanged since last visit. Denies any new floaters or FOL. Pt states "vision might be a little dimmer but no problems."  Last edited by Laurin Coder on 02/20/2022 10:26 AM.      Referring physician: Sharilyn Sites, MD [address] [place],  [place] [address]  HISTORICAL INFORMATION:   Selected notes from the [facility]: No current outpatient medications on file. (Ophthalmic Drugs)   No current facility-administered medications for this visit. (Ophthalmic Drugs)   Current Outpatient Medications (Other)  Medication Sig   acetaminophen (TYLENOL) 325 MG tablet Take 1-2 tablets (325-650 mg total) by mouth every 6 (six) hours as needed for mild pain (pain score 1-3 or temp > 100.5).   amiodarone (PACERONE) 200 MG tablet TAKE (1/2) TABLET BY MOUTH ONCE DAILY.   apixaban (ELIQUIS) 2.5 MG TABS tablet TAKE 1 TABLET BY MOUTH AT 10AM AND 1 TABLET AT 9PM.   atorvastatin (LIPITOR) 40 MG tablet TAKE (1) TABLET BY MOUTH AT BEDTIME.   calcium carbonate (OSCAL) 1500 (600 Ca) MG TABS tablet Take 1,500 mg by mouth daily.    carvedilol (COREG) 3.125 MG tablet TAKE 1 TABLET BY MOUTH TWICE DAILY WITH A MEAL. (Patient taking differently: Take 3.125 mg by mouth 2 (two) times daily with a meal.)   docusate sodium (COLACE) 100 MG capsule Take 100 mg by mouth 2 (two) times daily as needed for mild constipation.   furosemide (LASIX) 20 MG tablet Take 1 tablet (20 mg total) by mouth daily as needed.   Multiple Vitamins-Minerals (ICAPS PO) Take 1 capsule by mouth daily.    polyethylene glycol (MIRALAX MIX-IN PAX) 17 g packet Take 17 g by mouth daily.   spironolactone  (ALDACTONE) 25 MG tablet Take 0.5 tablets (12.5 mg total) by mouth daily.   No current facility-administered medications for this visit. (Other)      REVIEW OF SYSTEMS: ROS   Negative for: Constitutional, Gastrointestinal, Neurological, Skin, Genitourinary, Musculoskeletal, HENT, Endocrine, Cardiovascular, Eyes, Respiratory, Psychiatric, Allergic/Imm, Heme/Lymph Last edited by Hurman Horn, MD on 02/20/2022 11:18 AM.       ALLERGIES Allergies  Allergen Reactions   Codeine Nausea Only   Penicillins Other (See Comments)    "Not allergic just a lot of my family members are allergic". Has patient had a PCN reaction causing immediate rash, facial/tongue/throat swelling, SOB or lightheadedness with hypotension: No Has patient had a PCN reaction causing severe rash involving mucus membranes or skin necrosis: No Has patient had a PCN reaction that required hospitalization No Has patient had a PCN reaction occurring within the last 10 years: No If all of the above answers are "NO", then may proceed with Cephalosporin use.     PAST MEDICAL HISTORY Past Medical History:  Diagnosis Date   Cataracts, bilateral    Chronic systolic CHF (congestive heart failure) ([facility])    a. dx 06/2016 - conservative rx recommended. EF 20% with biventricular failure in setting of PE.   Closed fracture of left proximal humerus 07/11/2019   Demand ischemia ([facility])    Epistaxis    Macular degeneration  Dry OD; wet OS   Osteoporosis    07/18/16 previously on Premarin; S/P > 10 years Alendronate   Paroxysmal atrial tachycardia ([facility])    a. placed on amiodarone 06/2016.   Pulmonary emboli ([place]) 06/2016   Past Surgical History:  Procedure Laterality Date   CATARACT EXTRACTION     bilaterally   CHOLECYSTECTOMY     INTRAMEDULLARY (IM) NAIL INTERTROCHANTERIC Left 07/11/2019   Procedure: INTRAMEDULLARY (IM) NAIL INTERTROCHANTRIC;  Surgeon: Altamese [place], MD;  Location: [facility];  Service: Orthopedics;  Laterality:  Left;   TONSILLECTOMY      FAMILY HISTORY Family History  Problem Relation Age of Onset   Heart disease Mother    Stroke Mother    Diabetes Father    Cancer Sister        1 sister with metastatic bone cancer; 1 with breast cancer    SOCIAL HISTORY Social History   Tobacco Use   Smoking status: Former    Types: Cigarettes    Quit date: 1975    Years since quitting: 48.1   Smokeless tobacco: Never  Vaping Use   Vaping Use: Never used  Substance Use Topics   Alcohol use: No   Drug use: No         OPHTHALMIC EXAM:  Base Eye Exam     Visual Acuity (ETDRS)       Right Left   Dist cc 20/100 -2 CF at 1'   Dist ph cc NI NI    Correction: Glasses         Tonometry (Tonopen, 10:30 AM)       Right Left   Pressure 13 16         Pupils       Pupils Dark Light Shape React APD   Right PERRL 2 2 Round Minimal None   Left PERRL 2 2 Round Minimal None         Visual Fields (Counting fingers)       Left Right   Restrictions Partial inner superior temporal, inferior temporal, superior nasal, inferior nasal deficiencies Partial inner superior temporal, inferior temporal, superior nasal, inferior nasal deficiencies         Extraocular Movement       Right Left    Full Full         Neuro/Psych     Oriented x3: Yes   Mood/Affect: Normal         Dilation     Both eyes: 1.0% Mydriacyl, 2.5% Phenylephrine @ 10:30 AM           Slit Lamp and Fundus Exam     External Exam       Right Left   External Normal Normal         Slit Lamp Exam       Right Left   Lids/Lashes Normal Normal   Conjunctiva/Sclera White and quiet White and quiet   Cornea Clear Clear   Anterior Chamber Deep and quiet Deep and quiet   Iris Round and reactive Round and reactive   Lens Centered posterior chamber intraocular lens Centered posterior chamber intraocular lens   Anterior Vitreous Normal Normal         Fundus Exam       Right Left   Posterior  Vitreous Posterior vitreous detachment Posterior vitreous detachment   Disc Peripapillary atrophy Peripapillary atrophy   C/D Ratio 0.35 0.35   Macula Pigmented atrophy, near the FAZ, no macular thickening, no membrane, no  exudates, Intermediate age related macular degeneration Pigmented atrophy, in the FAZ, no macular thickening, no membrane, no exudates, Intermediate age related macular degeneration   Vessels Normal Normal   Periphery Normal Normal            IMAGING AND PROCEDURES  Imaging and Procedures for 02/20/22  OCT, Retina - OU - Both Eyes       Right Eye Quality was borderline. Central Foveal Thickness: 291. Progression has been stable. Findings include abnormal foveal contour, central retinal atrophy.   Left Eye Quality was good. Central Foveal Thickness: 278. Progression has been stable. Findings include abnormal foveal contour, retinal drusen , outer retinal atrophy, central retinal atrophy, inner retinal atrophy.      Color Fundus Photography Optos - OU - Both Eyes       Right Eye Progression has been stable. Disc findings include normal observations. Macula : geographic atrophy, drusen. Vessels : normal observations. Periphery : normal observations.   Left Eye Progression has been stable. Disc findings include normal observations. Macula : geographic atrophy, drusen. Vessels : normal observations. Periphery : normal observations.   Notes Bilateral central geographic atrophy no sign of CNVM OU             ASSESSMENT/PLAN:  Advanced nonexudative age-related macular degeneration of right eye without subfoveal involvement Encroachment of geographic atrophy in and around the [place], very small island of foveal RPE remains OD  Advanced nonexudative age-related macular degeneration of left eye with subfoveal involvement Central atrophy accounts for acuity OS      ICD-10-CM   1. Advanced nonexudative age-related macular degeneration of right eye without  subfoveal involvement  H35.3113 OCT, Retina - OU - Both Eyes    Color Fundus Photography Optos - OU - Both Eyes    2. Advanced nonexudative age-related macular degeneration of left eye with subfoveal involvement  H35.3124 OCT, Retina - OU - Both Eyes    Color Fundus Photography Optos - OU - Both Eyes      1.  No signs of active CNVM OU.  2. NO  Limitation on activity patient continues to care for herself lives independently  3.  Ophthalmic Meds Ordered this visit:  No orders of the defined types were placed in this encounter.      Return in about 1 year (around 02/20/2023) for DILATE OU, COLOR FP, OCT.  There are no Patient Instructions on file for this visit.   Explained the diagnoses, plan, and follow up with the patient and they expressed understanding.  Patient expressed understanding of the importance of proper follow up care.   Clent Demark Loella Hickle M.D. Diseases & Surgery of the Retina and Vitreous [facility] [place] 02/20/22     Abbreviations: M myopia (nearsighted); A astigmatism; H hyperopia (farsighted); P presbyopia; Mrx spectacle prescription;  CTL contact lenses; OD right eye; OS left eye; OU both eyes  XT exotropia; ET esotropia; PEK punctate epithelial keratitis; PEE punctate epithelial erosions; DES dry eye syndrome; MGD meibomian gland dysfunction; ATs artificial tears; PFAT's preservative free artificial tears; [place] nuclear sclerotic cataract; PSC posterior subcapsular cataract; ERM epi-retinal membrane; PVD posterior vitreous detachment; RD retinal detachment; DM diabetes mellitus; DR diabetic retinopathy; NPDR non-proliferative diabetic retinopathy; PDR proliferative diabetic retinopathy; CSME clinically significant macular edema; DME diabetic macular edema; dbh dot blot hemorrhages; CWS cotton wool spot; POAG primary open angle glaucoma; C/D cup-to-disc ratio; HVF humphrey visual field; GVF goldmann visual field; OCT optical coherence tomography; IOP  intraocular pressure; BRVO Branch  Branch retinal vein occlusion; CRVO central retinal vein occlusion; CRAO central retinal artery occlusion; BRAO branch retinal artery occlusion; RT retinal tear; SB scleral buckle; PPV pars plana vitrectomy; VH Vitreous hemorrhage; PRP panretinal laser photocoagulation; IVK intravitreal kenalog; VMT vitreomacular traction; MH Macular hole;  NVD neovascularization of the disc; NVE neovascularization elsewhere; AREDS age related eye disease study; ARMD age related macular degeneration; POAG primary open angle glaucoma; EBMD epithelial/anterior basement membrane dystrophy; ACIOL anterior chamber intraocular lens; IOL intraocular lens; PCIOL posterior chamber intraocular lens; Phaco/IOL phacoemulsification with intraocular lens placement; [place] photorefractive keratectomy; LASIK laser assisted in situ keratomileusis; HTN hypertension; DM diabetes mellitus; COPD chronic obstructive pulmonary disease

## 2022-02-20 NOTE — Assessment & Plan Note (Signed)
Central atrophy accounts for acuity OS

## 2022-04-11 ENCOUNTER — Ambulatory Visit (INDEPENDENT_AMBULATORY_CARE_PROVIDER_SITE_OTHER): Payer: Medicare Other | Admitting: Ophthalmology

## 2022-04-11 ENCOUNTER — Encounter (INDEPENDENT_AMBULATORY_CARE_PROVIDER_SITE_OTHER): Payer: Self-pay | Admitting: Ophthalmology

## 2022-04-11 DIAGNOSIS — H353113 Nonexudative age-related macular degeneration, right eye, advanced atrophic without subfoveal involvement: Secondary | ICD-10-CM

## 2022-04-11 DIAGNOSIS — H3561 Retinal hemorrhage, right eye: Secondary | ICD-10-CM

## 2022-04-11 DIAGNOSIS — H353211 Exudative age-related macular degeneration, right eye, with active choroidal neovascularization: Secondary | ICD-10-CM

## 2022-04-11 DIAGNOSIS — H353124 Nonexudative age-related macular degeneration, left eye, advanced atrophic with subfoveal involvement: Secondary | ICD-10-CM

## 2022-04-11 MED ORDER — BEVACIZUMAB 2.5 MG/0.1ML IZ SOSY
2.5000 mg | PREFILLED_SYRINGE | INTRAVITREAL | Status: AC | PRN
  Administered 2022-04-11: 2.5 mg via INTRAVITREAL

## 2022-04-11 NOTE — Assessment & Plan Note (Signed)
New today, with subretinal hemorrhage in the area of the macula that was functioning as her visual acuity.  Explains visual loss from 20/100 now to 20/320 ?

## 2022-04-11 NOTE — Progress Notes (Signed)
? ? ?04/11/2022 ? ?  ? ?CHIEF COMPLAINT ?Patient presents for  ?Chief Complaint  ?Patient presents with  ? Eye Problem  ? Macular Degeneration  ? ? ? ? ?HISTORY OF PRESENT ILLNESS: ?Miranda Ford is a 88 y.o. female who presents to the clinic today for:  ? ?HPI   ?WIP- decreased vision. ?Patient states "about a week a ago I noticed I can't read with my right eye as good. I was able to use a magnifying glass but now I can't see it at all. If I am looking at a phone number, I can only see the first half of it." ?Patient denies new FOL or floaters.  ?Denies new hospitalizations, falls, or surgeries since last visit. ?Last edited by Laurin Coder on 04/11/2022 10:06 AM.  ?  ? ? ?Referring physician: ?Sharilyn Sites, MD ?[address] ?[place],  [place] [address] ? ?HISTORICAL INFORMATION:  ? ?Selected notes from the [facility] ?  ?   ? ?CURRENT MEDICATIONS: ?No current outpatient medications on file. (Ophthalmic Drugs)  ? ?No current facility-administered medications for this visit. (Ophthalmic Drugs)  ? ?Current Outpatient Medications (Other)  ?Medication Sig  ? acetaminophen (TYLENOL) 325 MG tablet Take 1-2 tablets (325-650 mg total) by mouth every 6 (six) hours as needed for mild pain (pain score 1-3 or temp > 100.5).  ? amiodarone (PACERONE) 200 MG tablet TAKE (1/2) TABLET BY MOUTH ONCE DAILY.  ? apixaban (ELIQUIS) 2.5 MG TABS tablet TAKE 1 TABLET BY MOUTH AT 10AM AND 1 TABLET AT 9PM.  ? atorvastatin (LIPITOR) 40 MG tablet TAKE (1) TABLET BY MOUTH AT BEDTIME.  ? calcium carbonate (OSCAL) 1500 (600 Ca) MG TABS tablet Take 1,500 mg by mouth daily.   ? carvedilol (COREG) 3.125 MG tablet TAKE 1 TABLET BY MOUTH TWICE DAILY WITH A MEAL. (Patient taking differently: Take 3.125 mg by mouth 2 (two) times daily with a meal.)  ? docusate sodium (COLACE) 100 MG capsule Take 100 mg by mouth 2 (two) times daily as needed for mild constipation.  ? furosemide (LASIX) 20 MG tablet Take 1 tablet (20 mg total) by mouth  daily as needed.  ? Multiple Vitamins-Minerals (ICAPS PO) Take 1 capsule by mouth daily.   ? polyethylene glycol (MIRALAX MIX-IN PAX) 17 g packet Take 17 g by mouth daily.  ? spironolactone (ALDACTONE) 25 MG tablet Take 0.5 tablets (12.5 mg total) by mouth daily.  ? ?No current facility-administered medications for this visit. (Other)  ? ? ? ? ?REVIEW OF SYSTEMS: ?ROS   ?Negative for: Constitutional, Gastrointestinal, Neurological, Skin, Genitourinary, Musculoskeletal, HENT, Endocrine, Cardiovascular, Eyes, Respiratory, Psychiatric, Allergic/Imm, Heme/Lymph ?Last edited by Hurman Horn, MD on 04/11/2022 10:43 AM.  ?  ? ? ? ?ALLERGIES ?Allergies  ?Allergen Reactions  ? Codeine Nausea Only  ? Penicillins Other (See Comments)  ?  "Not allergic just a lot of my family members are allergic". ?Has patient had a PCN reaction causing immediate rash, facial/tongue/throat swelling, SOB or lightheadedness with hypotension: No ?Has patient had a PCN reaction causing severe rash involving mucus membranes or skin necrosis: No ?Has patient had a PCN reaction that required hospitalization No ?Has patient had a PCN reaction occurring within the last 10 years: No ?If all of the above answers are "NO", then may proceed with Cephalosporin use. ?  ? ? ?PAST MEDICAL HISTORY ?Past Medical History:  ?Diagnosis Date  ? Cataracts, bilateral   ? Chronic systolic CHF (congestive heart failure) ([facility])   ?  a. dx 06/2016 - conservative rx recommended. EF 20% with biventricular failure in setting of PE.  ? Closed fracture of left proximal humerus 07/11/2019  ? Demand ischemia ([facility])   ? Epistaxis   ? Macular degeneration   ? Dry OD; wet OS  ? Osteoporosis   ? 07/18/16 previously on Premarin; S/P > 10 years Alendronate  ? Paroxysmal atrial tachycardia ([facility])   ? a. placed on amiodarone 06/2016.  ? Pulmonary emboli ([facility]) 06/2016  ? ?Past Surgical History:  ?Procedure Laterality Date  ? CATARACT EXTRACTION    ? bilaterally  ? CHOLECYSTECTOMY    ?  INTRAMEDULLARY (IM) NAIL INTERTROCHANTERIC Left 07/11/2019  ? Procedure: INTRAMEDULLARY (IM) NAIL INTERTROCHANTRIC;  Surgeon: Altamese Armstrong, MD;  Location: [facility];  Service: Orthopedics;  Laterality: Left;  ? TONSILLECTOMY    ? ? ?FAMILY HISTORY ?Family History  ?Problem Relation Age of Onset  ? Heart disease Mother   ? Stroke Mother   ? Diabetes Father   ? Cancer Sister   ?     1 sister with metastatic bone cancer; 1 with breast cancer  ? ? ?SOCIAL HISTORY ?Social History  ? ?Tobacco Use  ? Smoking status: Former  ?  Types: Cigarettes  ?  Quit date: 25  ?  Years since quitting: 48.3  ? Smokeless tobacco: Never  ?Vaping Use  ? Vaping Use: Never used  ?Substance Use Topics  ? Alcohol use: No  ? Drug use: No  ? ?  ? ?  ? ?OPHTHALMIC EXAM: ? ?Base Eye Exam   ? ? Visual Acuity (ETDRS)   ? ?   Right Left  ? Dist cc 20/320 CF at 3'  ? Dist ph cc NI NI  ? ? Correction: Glasses  ? ?  ?  ? ? Tonometry (Tonopen, 10:09 AM)   ? ?   Right Left  ? Pressure 15 14  ? ?  ?  ? ? Pupils   ? ?   Pupils Dark Light Shape React APD  ? Right PERRL 2 2 Round Minimal None  ? Left PERRL 2 2 Round Minimal None  ? ?  ?  ? ? Visual Fields (Counting fingers)   ? ?   Left Right  ? Restrictions Partial inner superior temporal, inferior temporal, superior nasal, inferior nasal deficiencies Partial inner superior temporal, inferior temporal, superior nasal, inferior nasal deficiencies  ? ?  ?  ? ? Extraocular Movement   ? ?   Right Left  ?  Full Full  ? ?  ?  ? ? Neuro/Psych   ? ? Oriented x3: Yes  ? Mood/Affect: Normal  ? ?  ?  ? ? Dilation   ? ? Both eyes: 1.0% Mydriacyl, 2.5% Phenylephrine @ 10:09 AM  ? ?  ?  ? ?  ? ?Slit Lamp and Fundus Exam   ? ? External Exam   ? ?   Right Left  ? External Normal Normal  ? ?  ?  ? ? Slit Lamp Exam   ? ?   Right Left  ? Lids/Lashes Normal Normal  ? Conjunctiva/Sclera White and quiet White and quiet  ? Cornea Clear Clear  ? Anterior Chamber Deep and quiet Deep and quiet  ? Iris Round and reactive Round and  reactive  ? Lens Centered posterior chamber intraocular lens Centered posterior chamber intraocular lens  ? Anterior Vitreous Normal Normal  ? ?  ?  ? ? Fundus Exam   ? ?  Right Left  ? Posterior Vitreous Posterior vitreous detachment Posterior vitreous detachment  ? Disc Peripapillary atrophy Peripapillary atrophy  ? C/D Ratio 0.35 0.35  ? Macula Pigmented atrophy, near the FAZ, no exudates, Intermediate age related macular degeneration, Subretinal neovascular membrane, Intraretinal hemorrhage, Subretinal hemorrhage Pigmented atrophy, in the FAZ, no macular thickening, no membrane, no exudates, Intermediate age related macular degeneration  ? Vessels Normal Normal  ? Periphery Normal Normal  ? ?  ?  ? ?  ? ? ?IMAGING AND PROCEDURES  ?Imaging and Procedures for 04/11/22 ? ?OCT, Retina - OU - Both Eyes   ? ?   ?Right Eye ?Quality was borderline. Central Foveal Thickness: 391. Progression has been stable. Findings include abnormal foveal contour, central retinal atrophy, cystoid macular edema, subretinal fluid, choroidal neovascular membrane, subretinal hyper-reflective material.  ? ?Left Eye ?Quality was good. Central Foveal Thickness: 269. Progression has been stable. Findings include abnormal foveal contour, retinal drusen , outer retinal atrophy, central retinal atrophy, inner retinal atrophy.  ? ?Notes ?OD with macular thickening from subretinal hemorrhage CNVM ? ?  ? ?Color Fundus Photography Optos - OU - Both Eyes   ? ?   ?Right Eye ?Progression has been stable. Disc findings include normal observations. Macula : geographic atrophy, drusen. Vessels : normal observations. Periphery : normal observations.  ? ?Left Eye ?Progression has been stable. Disc findings include normal observations. Macula : geographic atrophy, drusen. Vessels : normal observations. Periphery : normal observations.  ? ?Notes ?Bilateral central geographic atrophy, center involved atrophy OS and center threatening OD now with OD new  hemorrhage in the macular region which accounts for the visual acuity decline OD, CNVM ? ?  ? ?Intravitreal Injection, Pharmacologic Agent - OD - Right Eye   ? ?   ?Time Out ?04/11/2022. 10:49 AM. Confirmed correct patient, p

## 2022-04-11 NOTE — Assessment & Plan Note (Signed)
Unchanged nasal to FAZ ?

## 2022-04-11 NOTE — Assessment & Plan Note (Signed)
Unchanged in the fovea ?

## 2022-05-04 DIAGNOSIS — E782 Mixed hyperlipidemia: Secondary | ICD-10-CM | POA: Diagnosis not present

## 2022-05-04 DIAGNOSIS — I509 Heart failure, unspecified: Secondary | ICD-10-CM | POA: Diagnosis not present

## 2022-05-04 DIAGNOSIS — N183 Chronic kidney disease, stage 3 unspecified: Secondary | ICD-10-CM | POA: Diagnosis not present

## 2022-05-04 DIAGNOSIS — I517 Cardiomegaly: Secondary | ICD-10-CM | POA: Diagnosis not present

## 2022-05-04 DIAGNOSIS — I10 Essential (primary) hypertension: Secondary | ICD-10-CM | POA: Diagnosis not present

## 2022-05-04 DIAGNOSIS — R7309 Other abnormal glucose: Secondary | ICD-10-CM | POA: Diagnosis not present

## 2022-05-04 DIAGNOSIS — I482 Chronic atrial fibrillation, unspecified: Secondary | ICD-10-CM | POA: Diagnosis not present

## 2022-05-04 DIAGNOSIS — E559 Vitamin D deficiency, unspecified: Secondary | ICD-10-CM | POA: Diagnosis not present

## 2022-05-04 DIAGNOSIS — Z6820 Body mass index (BMI) 20.0-20.9, adult: Secondary | ICD-10-CM | POA: Diagnosis not present

## 2022-05-17 ENCOUNTER — Encounter (INDEPENDENT_AMBULATORY_CARE_PROVIDER_SITE_OTHER): Payer: Medicare Other | Admitting: Ophthalmology

## 2022-05-18 ENCOUNTER — Ambulatory Visit (INDEPENDENT_AMBULATORY_CARE_PROVIDER_SITE_OTHER): Payer: PPO | Admitting: Ophthalmology

## 2022-05-18 ENCOUNTER — Encounter (INDEPENDENT_AMBULATORY_CARE_PROVIDER_SITE_OTHER): Payer: Self-pay | Admitting: Ophthalmology

## 2022-05-18 DIAGNOSIS — H3561 Retinal hemorrhage, right eye: Secondary | ICD-10-CM | POA: Diagnosis not present

## 2022-05-18 DIAGNOSIS — H353211 Exudative age-related macular degeneration, right eye, with active choroidal neovascularization: Secondary | ICD-10-CM

## 2022-05-18 DIAGNOSIS — H353124 Nonexudative age-related macular degeneration, left eye, advanced atrophic with subfoveal involvement: Secondary | ICD-10-CM | POA: Diagnosis not present

## 2022-05-18 MED ORDER — BEVACIZUMAB 2.5 MG/0.1ML IZ SOSY
2.5000 mg | PREFILLED_SYRINGE | INTRAVITREAL | Status: AC | PRN
  Administered 2022-05-18: 2.5 mg via INTRAVITREAL

## 2022-05-18 NOTE — Progress Notes (Signed)
05/18/2022     CHIEF COMPLAINT Patient presents for  Chief Complaint  Patient presents with   Macular Degeneration      HISTORY OF PRESENT ILLNESS: Miranda Ford is a 88 y.o. female who presents to the clinic today for:   HPI   5 weeks dilate OD, Avastin OD, OCT. Patient states vision is stable and unchanged since last visit. Denies any new floaters or FOL.  Last edited by Laurin Coder on 05/18/2022  2:30 PM.      Referring physician: Sharilyn Sites, MD [address] [place],  [place] [address]  HISTORICAL INFORMATION:   Selected notes from the [facility]: No current outpatient medications on file. (Ophthalmic Drugs)   No current facility-administered medications for this visit. (Ophthalmic Drugs)   Current Outpatient Medications (Other)  Medication Sig   acetaminophen (TYLENOL) 325 MG tablet Take 1-2 tablets (325-650 mg total) by mouth every 6 (six) hours as needed for mild pain (pain score 1-3 or temp > 100.5).   amiodarone (PACERONE) 200 MG tablet TAKE (1/2) TABLET BY MOUTH ONCE DAILY.   apixaban (ELIQUIS) 2.5 MG TABS tablet TAKE 1 TABLET BY MOUTH AT 10AM AND 1 TABLET AT 9PM.   atorvastatin (LIPITOR) 40 MG tablet TAKE (1) TABLET BY MOUTH AT BEDTIME.   calcium carbonate (OSCAL) 1500 (600 Ca) MG TABS tablet Take 1,500 mg by mouth daily.    carvedilol (COREG) 3.125 MG tablet TAKE 1 TABLET BY MOUTH TWICE DAILY WITH A MEAL. (Patient taking differently: Take 3.125 mg by mouth 2 (two) times daily with a meal.)   docusate sodium (COLACE) 100 MG capsule Take 100 mg by mouth 2 (two) times daily as needed for mild constipation.   furosemide (LASIX) 20 MG tablet Take 1 tablet (20 mg total) by mouth daily as needed.   Multiple Vitamins-Minerals (ICAPS PO) Take 1 capsule by mouth daily.    polyethylene glycol (MIRALAX MIX-IN PAX) 17 g packet Take 17 g by mouth daily.   spironolactone (ALDACTONE) 25 MG tablet Take 0.5 tablets (12.5  mg total) by mouth daily.   No current facility-administered medications for this visit. (Other)      REVIEW OF SYSTEMS: ROS   Negative for: Constitutional, Gastrointestinal, Neurological, Skin, Genitourinary, Musculoskeletal, HENT, Endocrine, Cardiovascular, Eyes, Respiratory, Psychiatric, Allergic/Imm, Heme/Lymph Last edited by Hurman Horn, MD on 05/18/2022  3:22 PM.       ALLERGIES Allergies  Allergen Reactions   Codeine Nausea Only   Penicillins Other (See Comments)    "Not allergic just a lot of my family members are allergic". Has patient had a PCN reaction causing immediate rash, facial/tongue/throat swelling, SOB or lightheadedness with hypotension: No Has patient had a PCN reaction causing severe rash involving mucus membranes or skin necrosis: No Has patient had a PCN reaction that required hospitalization No Has patient had a PCN reaction occurring within the last 10 years: No If all of the above answers are "NO", then may proceed with Cephalosporin use.     PAST MEDICAL HISTORY Past Medical History:  Diagnosis Date   Cataracts, bilateral    Chronic systolic CHF (congestive heart failure) ([facility])    a. dx 06/2016 - conservative rx recommended. EF 20% with biventricular failure in setting of PE.   Closed fracture of left proximal humerus 07/11/2019   Demand ischemia ([facility])    Epistaxis    Macular degeneration    Dry OD; wet OS  Osteoporosis    07/18/16 previously on Premarin; S/P > 10 years Alendronate   Paroxysmal atrial tachycardia ([facility])    a. placed on amiodarone 06/2016.   Pulmonary emboli ([facility]) 06/2016   Past Surgical History:  Procedure Laterality Date   CATARACT EXTRACTION     bilaterally   CHOLECYSTECTOMY     INTRAMEDULLARY (IM) NAIL INTERTROCHANTERIC Left 07/11/2019   Procedure: INTRAMEDULLARY (IM) NAIL INTERTROCHANTRIC;  Surgeon: Altamese [place], MD;  Location: [facility];  Service: Orthopedics;  Laterality: Left;   TONSILLECTOMY      FAMILY  HISTORY Family History  Problem Relation Age of Onset   Heart disease Mother    Stroke Mother    Diabetes Father    Cancer Sister        1 sister with metastatic bone cancer; 1 with breast cancer    SOCIAL HISTORY Social History   Tobacco Use   Smoking status: Former    Types: Cigarettes    Quit date: 1975    Years since quitting: 48.4   Smokeless tobacco: Never  Vaping Use   Vaping Use: Never used  Substance Use Topics   Alcohol use: No   Drug use: No         OPHTHALMIC EXAM:  Base Eye Exam     Visual Acuity (ETDRS)       Right Left   Dist [place] 20/320 CF at 3'   Dist ph [place] 20/200 -1 NI         Tonometry (Tonopen, 2:34 PM)       Right Left   Pressure 8 9         Pupils       Pupils Dark Light Shape React APD   Right PERRL 2 2 Round Minimal None   Left PERRL 2 2 Round Minimal None         Extraocular Movement       Right Left    Full Full         Neuro/Psych     Oriented x3: Yes   Mood/Affect: Normal         Dilation     Right eye: 1.0% Mydriacyl, 2.5% Phenylephrine @ 2:34 PM           Slit Lamp and Fundus Exam     External Exam       Right Left   External Normal Normal         Slit Lamp Exam       Right Left   Lids/Lashes Normal Normal   Conjunctiva/Sclera White and quiet White and quiet   Cornea Clear Clear   Anterior Chamber Deep and quiet Deep and quiet   Iris Round and reactive Round and reactive   Lens Centered posterior chamber intraocular lens Centered posterior chamber intraocular lens   Anterior Vitreous Normal Normal         Fundus Exam       Right Left   Posterior Vitreous Posterior vitreous detachment    Disc Peripapillary atrophy    C/D Ratio 0.35    Macula Pigmented atrophy, near the FAZ, no exudates, Intermediate age related macular degeneration, Subretinal neovascular membrane, Intraretinal hemorrhage, Subretinal hemorrhage    Vessels Normal    Periphery Normal             IMAGING  AND PROCEDURES  Imaging and Procedures for 05/18/22  OCT, Retina - OU - Both Eyes       Right Eye Quality was borderline. Central  Foveal Thickness: 355. Progression has been stable. Findings include abnormal foveal contour, central retinal atrophy, cystoid macular edema, subretinal fluid, choroidal neovascular membrane, subretinal hyper-reflective material.   Left Eye Quality was good. Central Foveal Thickness: 261. Progression has been stable. Findings include abnormal foveal contour, retinal drusen , outer retinal atrophy, central retinal atrophy, inner retinal atrophy.   Notes OD with macular thickening from subretinal hemorrhage CNVM     Intravitreal Injection, Pharmacologic Agent - OD - Right Eye       Time Out 05/18/2022. 3:24 PM. Confirmed correct patient, procedure, site, and patient consented.   Anesthesia Topical anesthesia was used. Anesthetic medications included Lidocaine 4%.   Procedure Preparation included 5% betadine to ocular surface, 10% betadine to eyelids, Tobramycin 0.3%. A 30 gauge needle was used.   Injection: 2.5 mg bevacizumab 2.5 MG/0.1ML   Route: Intravitreal, Site: Right Eye   NDC: [phone], Lot: [id number], Expiration date: 07/23/2022   Post-op Post injection exam found visual acuity of at least counting fingers. The patient tolerated the procedure well. There were no complications. The patient received written and verbal post procedure care education. Post injection medications included ocuflox.              ASSESSMENT/PLAN:  Advanced nonexudative age-related macular degeneration of left eye with subfoveal involvement Stable with large geographic atrophy, large scotoma  Exudative age-related macular degeneration of right eye with active choroidal neovascularization (HCC) None foveal juxta foveal subretinal hemorrhage thickening  Retinal hemorrhage, right eye Smaller OD today combined with CNVM     ICD-10-CM   1. Exudative  age-related macular degeneration of right eye with active choroidal neovascularization (HCC)  H35.3211 OCT, Retina - OU - Both Eyes    Intravitreal Injection, Pharmacologic Agent - OD - Right Eye    bevacizumab (AVASTIN) SOSY 2.5 mg    2. Advanced nonexudative age-related macular degeneration of left eye with subfoveal involvement  H35.3124     3. Retinal hemorrhage, right eye  H35.61       1.  OD vastly improved, much less intraretinal subretinal hemorrhage from juxta foveal CNVM.  Injection of 1 Avastin at 5 weeks.  Repeat injection today  2.  Dilate OD next in 6 weeks possible Avastin  3.  Ophthalmic Meds Ordered this visit:  Meds ordered this encounter  Medications   bevacizumab (AVASTIN) SOSY 2.5 mg       Return in about 6 weeks (around 06/29/2022) for dilate, OD, AVASTIN OCT.  There are no Patient Instructions on file for this visit.   Explained the diagnoses, plan, and follow up with the patient and they expressed understanding.  Patient expressed understanding of the importance of proper follow up care.   Clent Demark Samya Siciliano M.D. Diseases & Surgery of the Retina and Vitreous [facility] 05/18/22     Abbreviations: M myopia (nearsighted); A astigmatism; H hyperopia (farsighted); P presbyopia; Mrx spectacle prescription;  CTL contact lenses; OD right eye; OS left eye; OU both eyes  XT exotropia; ET esotropia; PEK punctate epithelial keratitis; PEE punctate epithelial erosions; DES dry eye syndrome; MGD meibomian gland dysfunction; ATs artificial tears; PFAT's preservative free artificial tears; [place] nuclear sclerotic cataract; PSC posterior subcapsular cataract; ERM epi-retinal membrane; PVD posterior vitreous detachment; RD retinal detachment; DM diabetes mellitus; DR diabetic retinopathy; NPDR non-proliferative diabetic retinopathy; PDR proliferative diabetic retinopathy; CSME clinically significant macular edema; DME diabetic macular edema; dbh dot blot  hemorrhages; CWS cotton wool spot; POAG primary open angle glaucoma; C/D cup-to-disc ratio;  HVF humphrey visual field; GVF goldmann visual field; OCT optical coherence tomography; IOP intraocular pressure; BRVO Branch retinal vein occlusion; CRVO central retinal vein occlusion; CRAO central retinal artery occlusion; BRAO branch retinal artery occlusion; RT retinal tear; SB scleral buckle; PPV pars plana vitrectomy; VH Vitreous hemorrhage; PRP panretinal laser photocoagulation; IVK intravitreal kenalog; VMT vitreomacular traction; MH Macular hole;  NVD neovascularization of the disc; NVE neovascularization elsewhere; AREDS age related eye disease study; ARMD age related macular degeneration; POAG primary open angle glaucoma; EBMD epithelial/anterior basement membrane dystrophy; ACIOL anterior chamber intraocular lens; IOL intraocular lens; PCIOL posterior chamber intraocular lens; Phaco/IOL phacoemulsification with intraocular lens placement; [place] photorefractive keratectomy; LASIK laser assisted in situ keratomileusis; HTN hypertension; DM diabetes mellitus; COPD chronic obstructive pulmonary disease

## 2022-05-18 NOTE — Assessment & Plan Note (Signed)
Smaller OD today combined with CNVM

## 2022-05-18 NOTE — Assessment & Plan Note (Signed)
None foveal juxta foveal subretinal hemorrhage thickening

## 2022-05-18 NOTE — Assessment & Plan Note (Signed)
Stable with large geographic atrophy, large scotoma

## 2022-05-29 ENCOUNTER — Other Ambulatory Visit: Payer: Self-pay | Admitting: Cardiology

## 2022-05-29 NOTE — Telephone Encounter (Signed)
Prescription refill request for Eliquis received. Indication: PE Last office visit: 12/22/21  B Strader PA-C Scr: 1.11 on 11/04/21 Age: 88 Weight: 53.1kg  Based on above findings Eliquis 2.'5mg'$  twice daily is the appropriate dose.  Refill approved.

## 2022-06-22 NOTE — Progress Notes (Signed)
Cardiology Office Note    Date:  06/23/2022   ID:  Miranda Ford, DOB [birth year], MRN [id number]  PCP:  Sharilyn Sites, MD  Cardiologist: Rozann Lesches, MD    Chief Complaint  Patient presents with   Follow-up    6 month visit    History of Present Illness:    Miranda Ford is a 88 y.o. female with past medical history of paroxysmal atrial tachycardia, HFrEF (EF 25% by echocardiogram in 2017 and conservative management pursued), aortic stenosis, Stage 3 CKD, macular degeneration and history of PE (on chronic anticoagulation with Eliquis) who presents to the office today for 53-monthfollow-up.  She was last examined by myself in 11/2021 and had recently been evaluated in the ED for a syncopal episode. She was unaware of any precipitating symptoms and denied any recurrent episodes. It was felt her episode was likely due to orthostasis. We discussed a possible repeat echocardiogram at that time but she did not wish to pursue further testing and was therefore continued on her current medical therapy including Coreg 3.125 mg twice daily, Spironolactone 12.5 mg daily and Lasix 20 mg as needed.   In talking with the patient and her niece today, she reports she has overall been doing well since her last visit. She continues to live by herself and performs ADL's independently. She enjoys cooking and family members and neighbors do bring her meals at times. She denies any recent chest pain or dyspnea on exertion. No recent orthopnea, PND or pitting edema. Was previously on Lasix for intermittent edema but has not had to utilize this in several months. She remains on Eliquis for anticoagulation with no reports of active bleeding. No recurrent syncopal episodes since her last visit.   Past Medical History:  Diagnosis Date   Cataracts, bilateral    Chronic systolic CHF (congestive heart failure) ([facility]    a. dx 06/2016 - conservative rx recommended. EF 20% with biventricular failure in setting of  PE.   Closed fracture of left proximal humerus 07/11/2019   [place]and [place]    Epistaxis    Macular degeneration    Dry OD; wet OS   Osteoporosis    07/18/16 previously on Premarin; S/P > 10 years Alendronate   Paroxysmal atrial tachycardia ([facility]    a. placed on amiodarone 06/2016.   [place]oli [place] 06/2016    Past Surgical History:  Procedure Laterality Date   CATARACT EXTRACTION     bilaterally   CHOLECYSTECTOMY     INTRAMEDULLARY (IM) NAIL INTERTROCHANTERIC Left 07/11/2019   Procedure: INTRAMEDULLARY (IM) NAIL INTERTROCHANTRIC;  Surgeon: HAltamese White Salmon MD;  [place] [facility]  Service: Orthopedics;  Laterality: Left;   TONSILLECTOMY      Current Medications: Outpatient Medications Prior to Visit  Medication Sig Dispense Refill   amiodarone (PACERONE) 200 MG tablet TAKE (1/2) TABLET BY MOUTH ONCE DAILY. 45 tablet 3   apixaban (ELIQUIS) 2.5 MG TABS tablet TAKE 1 TABLET BY MOUTH AT 10AM AND 1 TABLET AT 9PM. 60 tablet 3   atorvastatin (LIPITOR) 40 MG tablet TAKE (1) TABLET BY MOUTH AT BEDTIME. 90 tablet 3   calcium carbonate (OSCAL) 1500 (600 Ca) MG TABS tablet Take 1,500 mg by mouth daily.      carvedilol (COREG) 3.125 MG tablet TAKE 1 TABLET BY MOUTH TWICE DAILY WITH A MEAL. (Patient taking differently: Take 3.125 mg by mouth 2 (two) times daily with a meal.) 60 tablet 5   spironolactone (ALDACTONE) 25 MG tablet Take  0.5 tablets (12.5 mg total) by mouth daily. 45 tablet 3   acetaminophen (TYLENOL) 325 MG tablet Take 1-2 tablets (325-650 mg total) by mouth every 6 (six) hours as needed for mild pain (pain score 1-3 or temp > 100.5). (Patient not taking: Reported on 06/23/2022) 60 tablet    Multiple Vitamins-Minerals (ICAPS PO) Take 1 capsule by mouth daily.  (Patient not taking: Reported on 06/23/2022)     docusate sodium (COLACE) 100 MG capsule Take 100 mg by mouth 2 (two) times daily as needed for mild constipation. (Patient not taking: Reported on 06/23/2022)     furosemide  (LASIX) 20 MG tablet Take 1 tablet (20 mg total) by mouth daily as needed. (Patient not taking: Reported on 06/23/2022) 90 tablet 3   polyethylene glycol (MIRALAX MIX-IN PAX) 17 g packet Take 17 g by mouth daily. (Patient not taking: Reported on 06/23/2022) 14 each 1   No facility-administered medications prior to visit.     Allergies:   Codeine and Penicillins   Social History   Socioeconomic History   Marital status: Widowed    Spouse name: Not on file   Number of children: Not on file   Years of education: Not on file   Highest education level: Not on file  Occupational History   Not on file  Tobacco Use   Smoking status: Former    Types: Cigarettes    Quit date: 51    Years since quitting: 48.5   Smokeless tobacco: Never  Vaping Use   Vaping Use: Never used  Substance and Sexual Activity   Alcohol use: No   Drug use: No   Sexual activity: Not Currently  Other Topics Concern   Not on file  Social History Narrative   Not on file   Social Determinants of Health   Financial Resource Strain: Not on file  Food Insecurity: Not on file  Transportation Needs: No Transportation Needs (08/12/2019)   PRAPARE - Hydrologist (Medical): No    Lack of Transportation (Non-Medical): No  Physical Activity: Not on file  Stress: Not on file  Social Connections: Not on file     Family History:  The patient's family history includes Cancer in her sister; Diabetes in her father; Heart disease in her mother; Stroke in her mother.   Review of Systems:    Please see the history of present illness.     All other systems reviewed and are otherwise negative except as noted above.   Physical Exam:    VS:  BP 134/70   Pulse (!) 59   Ht 5' 3.5" (1.613 m)   Wt 116 lb 6.4 oz (52.8 kg)   SpO2 93%   BMI 20.30 kg/m    General: Pleasant elderly female appearing in no acute distress. Head: Normocephalic, atraumatic. Neck: No carotid bruits. JVD not elevated.   Lungs: Respirations regular and unlabored, without wheezes or rales.  Heart: Regular rate and rhythm. No S3 or S4.  2/6 SEM along RUSB.  Abdomen: Appears non-distended. No obvious abdominal masses. Msk:  Strength and tone appear normal for age. No obvious joint deformities or effusions. Extremities: No clubbing or cyanosis. No pitting edema. Varicose veins noted. Distal pedal pulses are 2+ bilaterally. Neuro: Alert and oriented X 3. Moves all extremities spontaneously. No focal deficits noted. Psych:  Responds to questions appropriately with a normal affect. Skin: No rashes or lesions noted  Wt Readings from Last 3 Encounters:  06/23/22 116 lb  6.4 oz (52.8 kg)  12/22/21 117 lb (53.1 kg)  06/08/21 121 lb 12.8 oz (55.2 kg)     Studies/Labs Reviewed:   EKG:  EKG is not ordered today.   Recent Labs: 11/04/2021: ALT 20; BUN 28; Creatinine, Ser 1.11; Hemoglobin 13.3; Platelets 199; Potassium 4.7; Sodium 136   Lipid Panel    Component Value Date/Time   CHOL 141 07/04/2016 0443   TRIG 41 07/04/2016 0443   HDL 56 07/04/2016 0443   CHOLHDL 2.5 07/04/2016 0443   VLDL 8 07/04/2016 0443   LDLCALC 77 07/04/2016 0443    Additional studies/ records that were reviewed today include:   Echocardiogram: 08/2016 Study Conclusions   - Left ventricle: The cavity size was mildly dilated. Wall    thickness was normal. The estimated ejection fraction was 25%.    Diffuse hypokinesis. Doppler parameters are consistent with    abnormal left ventricular relaxation (grade 1 diastolic    dysfunction). Doppler parameters are consistent with high    ventricular filling pressure.  - Aortic valve: Probably mild to moderate aortic stenosis vs    pseudo-stenosis due to reduced left ventricular output.    Moderately thickened, moderately calcified leaflets. There was    mild regurgitation. Peak velocity (S): 195 cm/s. Mean gradient    (S): 7 mm Hg. Valve area (VTI): 1.41 cm^2. Valve area (Vmax):    1.46  cm^2. Valve area (Vmean): 1.58 cm^2.  - Aorta: Aortic root at upper normal limits in diameter. Aortic    root dimension: 39 mm (ED).  - Mitral valve: Calcified annulus. Mildly thickened, mildly    calcified leaflets . There was mild regurgitation.  - Left atrium: The atrium was moderately dilated.  - Right ventricle: Systolic function was mildly reduced.  - Tricuspid valve: There was moderate regurgitation.  - Pulmonary arteries: PA peak pressure: 44 mm Hg (S).  - Inferior vena cava: The vessel was severely dilated. The    respirophasic diameter changes were blunted (< 50%), consistent    with elevated central venous pressure.    Assessment:    1. HFrEF (heart failure with reduced ejection fraction) ([place])   2. Aortic valve stenosis, etiology of cardiac valve disease unspecified   3. PAT (paroxysmal atrial tachycardia) ([place])   4. History of pulmonary embolism      Plan:   In order of problems listed above:  1. HFrEF - Her ejection fraction was previously reduced to 25% in 2017 and she has not wished to pursue repeat evaluation in the setting of her advanced age. Therefore, further testing has not been pursued. She has overall been doing well and denies any changes in her respiratory status. Will continue current medical therapy with Coreg 3.125 mg twice daily and Spironolactone 12.5 mg daily.  2. Aortic Stenosis - This was mild to moderate by echocardiogram in 2017 and she has not wished to pursue further evaluation.  3. Paroxysmal Atrial Tachycardia - She denies any recent palpitations and is maintaining normal sinus rhythm by examination today. She remains on low-dose Amiodarone 100 mg daily and Coreg 3.125 mg twice daily. She did have recent labs with her PCP in 04/2022 and we will request a copy of these to make sure TSH and LFT's were checked.  4. History of PE - She has been on chronic anticoagulation with Eliquis and denies any evidence of active bleeding. She is on the  correct dose given her age and weight.     Medication Adjustments/Labs and Tests Ordered:  Current medicines are reviewed at length with the patient today.  Concerns regarding medicines are outlined above.  Medication changes, Labs and Tests ordered today are listed in the Patient Instructions below. Patient Instructions  Medication Instructions:  Your physician recommends that you continue on your current medications as directed. Please refer to the Current Medication list given to you today.  You have been given samples of Eliquis 2.5 mg  ( Lot # [id number], Exp 08/2022)  *If you need a refill on your cardiac medications before your next appointment, please call your pharmacy*   Lab Work: NONE   If you have labs (blood work) drawn today and your tests are completely normal, you will receive your results only by: [place] (if you have MyChart) OR A paper copy in the mail If you have any lab test that is abnormal or we need to change your treatment, we will call you to review the results.   Testing/Procedures: NONE    Follow-Up: At [facility], you and your health needs are our priority.  As part of our continuing mission to provide you with exceptional heart care, we have created designated Provider Care Teams.  These Care Teams include your primary Cardiologist (physician) and Advanced Practice Providers (APPs -  Physician Assistants and Nurse Practitioners) who all work together to provide you with the care you need, when you need it.  We recommend signing up for the patient portal called "MyChart".  Sign up information is provided on this After Visit Summary.  MyChart is used to connect with patients for Virtual Visits (Telemedicine).  Patients are able to view lab/test results, encounter notes, upcoming appointments, etc.  Non-urgent messages can be sent to your provider as well.   To learn more about what you can do with MyChart, go to [email or web address].    Your  next appointment:   6 month(s)  The format for your next appointment:   In Person  Provider:   You may see Rozann Lesches, MD or one of the following Advanced Practice Providers on your designated Care Team:   [place], PA-C  Ermalinda Barrios, [place] .    Other Instructions Thank you for choosing [facility]!    Important Information About Sugar         Signed, Erma Heritage, PA-C  06/23/2022 5:06 PM    [facility] [address] [place], [place] [address] Phone: [phone] Fax: [phone]

## 2022-06-23 ENCOUNTER — Ambulatory Visit: Payer: PPO | Admitting: Student

## 2022-06-23 ENCOUNTER — Encounter: Payer: Self-pay | Admitting: Student

## 2022-06-23 ENCOUNTER — Encounter: Payer: Self-pay | Admitting: *Deleted

## 2022-06-23 VITALS — BP 134/70 | HR 59 | Ht 63.5 in | Wt 116.4 lb

## 2022-06-23 DIAGNOSIS — I350 Nonrheumatic aortic (valve) stenosis: Secondary | ICD-10-CM

## 2022-06-23 DIAGNOSIS — I5020 Unspecified systolic (congestive) heart failure: Secondary | ICD-10-CM

## 2022-06-23 DIAGNOSIS — I471 Supraventricular tachycardia: Secondary | ICD-10-CM | POA: Diagnosis not present

## 2022-06-23 DIAGNOSIS — Z86711 Personal history of pulmonary embolism: Secondary | ICD-10-CM

## 2022-06-23 NOTE — Patient Instructions (Addendum)
Medication Instructions:  Your physician recommends that you continue on your current medications as directed. Please refer to the Current Medication list given to you today.  You have been given samples of Eliquis 2.5 mg  ( Lot # [id number], Exp 08/2022)  *If you need a refill on your cardiac medications before your next appointment, please call your pharmacy*   Lab Work: NONE   If you have labs (blood work) drawn today and your tests are completely normal, you will receive your results only by: [place] (if you have MyChart) OR A paper copy in the mail If you have any lab test that is abnormal or we need to change your treatment, we will call you to review the results.   Testing/Procedures: NONE    Follow-Up: At [facility], you and your health needs are our priority.  As part of our continuing mission to provide you with exceptional heart care, we have created designated Provider Care Teams.  These Care Teams include your primary Cardiologist (physician) and Advanced Practice Providers (APPs -  Physician Assistants and Nurse Practitioners) who all work together to provide you with the care you need, when you need it.  We recommend signing up for the patient portal called "MyChart".  Sign up information is provided on this After Visit Summary.  MyChart is used to connect with patients for Virtual Visits (Telemedicine).  Patients are able to view lab/test results, encounter notes, upcoming appointments, etc.  Non-urgent messages can be sent to your provider as well.   To learn more about what you can do with MyChart, go to [email or web address].    Your next appointment:   6 month(s)  The format for your next appointment:   In Person  Provider:   You may see Rozann Lesches, MD or one of the following Advanced Practice Providers on your designated Care Team:   [place], PA-C  Ermalinda Barrios, [place] .    Other Instructions Thank you for choosing [facility]!    Important Information About Sugar

## 2022-06-28 ENCOUNTER — Ambulatory Visit (INDEPENDENT_AMBULATORY_CARE_PROVIDER_SITE_OTHER): Payer: PPO | Admitting: Ophthalmology

## 2022-06-28 ENCOUNTER — Encounter (INDEPENDENT_AMBULATORY_CARE_PROVIDER_SITE_OTHER): Payer: Self-pay | Admitting: Ophthalmology

## 2022-06-28 DIAGNOSIS — H353124 Nonexudative age-related macular degeneration, left eye, advanced atrophic with subfoveal involvement: Secondary | ICD-10-CM | POA: Diagnosis not present

## 2022-06-28 DIAGNOSIS — H353211 Exudative age-related macular degeneration, right eye, with active choroidal neovascularization: Secondary | ICD-10-CM

## 2022-06-28 DIAGNOSIS — H353113 Nonexudative age-related macular degeneration, right eye, advanced atrophic without subfoveal involvement: Secondary | ICD-10-CM

## 2022-06-28 MED ORDER — BEVACIZUMAB 2.5 MG/0.1ML IZ SOSY
2.5000 mg | PREFILLED_SYRINGE | INTRAVITREAL | Status: AC | PRN
  Administered 2022-06-28: 2.5 mg via INTRAVITREAL

## 2022-06-28 NOTE — Progress Notes (Signed)
06/28/2022     CHIEF COMPLAINT Patient presents for  Chief Complaint  Patient presents with   Macular Degeneration      HISTORY OF PRESENT ILLNESS: Miranda Ford is a 88 y.o. female who presents to the clinic today for:   HPI   Today at 6 weeks post most recent evaluation and injection into vegF right eye for active CNVM threatening scotoma enlargement, no interval change in medical history Last edited by Hurman Horn, MD on 06/28/2022 10:04 AM.      Referring physician: Sharilyn Sites, MD [address] [place],  [place] [address]  HISTORICAL INFORMATION:   Selected notes from the [facility]: No current outpatient medications on file. (Ophthalmic Drugs)   No current facility-administered medications for this visit. (Ophthalmic Drugs)   Current Outpatient Medications (Other)  Medication Sig   acetaminophen (TYLENOL) 325 MG tablet Take 1-2 tablets (325-650 mg total) by mouth every 6 (six) hours as needed for mild pain (pain score 1-3 or temp > 100.5). (Patient not taking: Reported on 06/23/2022)   amiodarone (PACERONE) 200 MG tablet TAKE (1/2) TABLET BY MOUTH ONCE DAILY.   apixaban (ELIQUIS) 2.5 MG TABS tablet TAKE 1 TABLET BY MOUTH AT 10AM AND 1 TABLET AT 9PM.   atorvastatin (LIPITOR) 40 MG tablet TAKE (1) TABLET BY MOUTH AT BEDTIME.   calcium carbonate (OSCAL) 1500 (600 Ca) MG TABS tablet Take 1,500 mg by mouth daily.    carvedilol (COREG) 3.125 MG tablet TAKE 1 TABLET BY MOUTH TWICE DAILY WITH A MEAL. (Patient taking differently: Take 3.125 mg by mouth 2 (two) times daily with a meal.)   Multiple Vitamins-Minerals (ICAPS PO) Take 1 capsule by mouth daily.  (Patient not taking: Reported on 06/23/2022)   spironolactone (ALDACTONE) 25 MG tablet Take 0.5 tablets (12.5 mg total) by mouth daily.   No current facility-administered medications for this visit. (Other)      REVIEW OF SYSTEMS: ROS   Negative for: Constitutional,  Gastrointestinal, Neurological, Skin, Genitourinary, Musculoskeletal, HENT, Endocrine, Cardiovascular, Eyes, Respiratory, Psychiatric, Allergic/Imm, Heme/Lymph Last edited by Hurman Horn, MD on 06/28/2022 10:04 AM.       ALLERGIES Allergies  Allergen Reactions   Codeine Nausea Only   Penicillins Other (See Comments)    "Not allergic just a lot of my family members are allergic". Has patient had a PCN reaction causing immediate rash, facial/tongue/throat swelling, SOB or lightheadedness with hypotension: No Has patient had a PCN reaction causing severe rash involving mucus membranes or skin necrosis: No Has patient had a PCN reaction that required hospitalization No Has patient had a PCN reaction occurring within the last 10 years: No If all of the above answers are "NO", then may proceed with Cephalosporin use.     PAST MEDICAL HISTORY Past Medical History:  Diagnosis Date   Cataracts, bilateral    Chronic systolic CHF (congestive heart failure) ([place])    a. dx 06/2016 - conservative rx recommended. EF 20% with biventricular failure in setting of PE.   Closed fracture of left proximal humerus 07/11/2019   Demand ischemia ([place])    Epistaxis    Macular degeneration    Dry OD; wet OS   Osteoporosis    07/18/16 previously on Premarin; S/P > 10 years Alendronate   Paroxysmal atrial tachycardia ([place])    a. placed on amiodarone 06/2016.   Pulmonary emboli ([place]) 06/2016   Past Surgical History:  Procedure Laterality Date  CATARACT EXTRACTION     bilaterally   CHOLECYSTECTOMY     INTRAMEDULLARY (IM) NAIL INTERTROCHANTERIC Left 07/11/2019   Procedure: INTRAMEDULLARY (IM) NAIL INTERTROCHANTRIC;  Surgeon: Altamese [place], MD;  Location: [facility];  Service: Orthopedics;  Laterality: Left;   TONSILLECTOMY      FAMILY HISTORY Family History  Problem Relation Age of Onset   Heart disease Mother    Stroke Mother    Diabetes Father    Cancer Sister        1 sister with metastatic bone  cancer; 1 with breast cancer    SOCIAL HISTORY Social History   Tobacco Use   Smoking status: Former    Types: Cigarettes    Quit date: 1975    Years since quitting: 48.5   Smokeless tobacco: Never  Vaping Use   Vaping Use: Never used  Substance Use Topics   Alcohol use: No   Drug use: No         OPHTHALMIC EXAM:  Base Eye Exam     Visual Acuity (ETDRS)       Right Left   Dist [place] 20/400 CF at 3'         Tonometry (Tonopen, 10:04 AM)       Right Left   Pressure 15 16         Pupils       Pupils APD   Right PERRL None   Left PERRL None         Visual Fields       Left Right   Restrictions Partial inner superior temporal, inferior temporal, superior nasal, inferior nasal deficiencies Partial inner superior temporal, inferior temporal, superior nasal, inferior nasal deficiencies         Neuro/Psych     Oriented x3: Yes   Mood/Affect: Normal         Dilation     Right eye: 1.0% Mydriacyl, 2.5% Phenylephrine @ 10:04 AM           Slit Lamp and Fundus Exam     External Exam       Right Left   External Normal Normal         Slit Lamp Exam       Right Left   Lids/Lashes Normal Normal   Conjunctiva/Sclera White and quiet White and quiet   Cornea Clear Clear   Anterior Chamber Deep and quiet Deep and quiet   Iris Round and reactive Round and reactive   Lens Centered posterior chamber intraocular lens Centered posterior chamber intraocular lens   Anterior Vitreous Normal Normal         Fundus Exam       Right Left   Posterior Vitreous Posterior vitreous detachment    Disc Peripapillary atrophy    C/D Ratio 0.35    Macula Pigmented atrophy, near the FAZ, no exudates, Intermediate age related macular degeneration, Subretinal neovascular membrane, Intraretinal hemorrhage, Subretinal hemorrhage    Vessels Normal    Periphery Normal             IMAGING AND PROCEDURES  Imaging and Procedures for 06/28/22  OCT, Retina  - OU - Both Eyes       Right Eye Quality was borderline. Central Foveal Thickness: 312. Progression has improved. Findings include abnormal foveal contour, subretinal hyper-reflective material, choroidal neovascular membrane, cystoid macular edema, subretinal fluid, central retinal atrophy.   Left Eye Quality was good. Central Foveal Thickness: 251. Progression has been stable. Findings include abnormal foveal  contour, retinal drusen , central retinal atrophy, inner retinal atrophy, outer retinal atrophy.   Notes OD with macular thickening from subretinal hemorrhage CNVM, superonasal to FAZ, now much smaller as compared to previous examination posttreatment.  Repeat injection today and she will reevaluate again in 6 weeks              ASSESSMENT/PLAN:  Advanced nonexudative age-related macular degeneration of left eye with subfoveal involvement Atrophy accounts for acuity OS  Advanced nonexudative age-related macular degeneration of right eye without subfoveal involvement Atrophy perifoveal nearing fovea     ICD-10-CM   1. Exudative age-related macular degeneration of right eye with active choroidal neovascularization (HCC)  H35.3211 OCT, Retina - OU - Both Eyes    2. Advanced nonexudative age-related macular degeneration of left eye with subfoveal involvement  H35.3124 OCT, Retina - OU - Both Eyes    3. Advanced nonexudative age-related macular degeneration of right eye without subfoveal involvement  H35.3113       1.  OD vastly improved macular condition and prevention of scotoma enlargement successfully post Avastin.  Today had interval follow-up 6 weeks.  Repeat injection today and reevaluate next again in 6 weeks OD  2.  Foveal atrophy limiting acuity as well OU  3.  Ophthalmic Meds Ordered this visit:  No orders of the defined types were placed in this encounter.      Return in about 6 weeks (around 08/09/2022) for dilate, OD, AVASTIN OCT.  There are no Patient  Instructions on file for this visit.   Explained the diagnoses, plan, and follow up with the patient and they expressed understanding.  Patient expressed understanding of the importance of proper follow up care.   Clent Demark Brantley Wiley M.D. Diseases & Surgery of the Retina and Vitreous Retina & Diabetic [place] 06/28/22     Abbreviations: M myopia (nearsighted); A astigmatism; H hyperopia (farsighted); P presbyopia; Mrx spectacle prescription;  CTL contact lenses; OD right eye; OS left eye; OU both eyes  XT exotropia; ET esotropia; PEK punctate epithelial keratitis; PEE punctate epithelial erosions; DES dry eye syndrome; MGD meibomian gland dysfunction; ATs artificial tears; PFAT's preservative free artificial tears; [place] nuclear sclerotic cataract; PSC posterior subcapsular cataract; ERM epi-retinal membrane; PVD posterior vitreous detachment; RD retinal detachment; DM diabetes mellitus; DR diabetic retinopathy; NPDR non-proliferative diabetic retinopathy; PDR proliferative diabetic retinopathy; CSME clinically significant macular edema; DME diabetic macular edema; dbh dot blot hemorrhages; CWS cotton wool spot; POAG primary open angle glaucoma; C/D cup-to-disc ratio; HVF humphrey visual field; GVF goldmann visual field; OCT optical coherence tomography; IOP intraocular pressure; BRVO Branch retinal vein occlusion; CRVO central retinal vein occlusion; CRAO central retinal artery occlusion; BRAO branch retinal artery occlusion; RT retinal tear; SB scleral buckle; PPV pars plana vitrectomy; VH Vitreous hemorrhage; PRP panretinal laser photocoagulation; IVK intravitreal kenalog; VMT vitreomacular traction; MH Macular hole;  NVD neovascularization of the disc; NVE neovascularization elsewhere; AREDS age related eye disease study; ARMD age related macular degeneration; POAG primary open angle glaucoma; EBMD epithelial/anterior basement membrane dystrophy; ACIOL anterior chamber intraocular lens; IOL intraocular  lens; PCIOL posterior chamber intraocular lens; Phaco/IOL phacoemulsification with intraocular lens placement; [place] photorefractive keratectomy; LASIK laser assisted in situ keratomileusis; HTN hypertension; DM diabetes mellitus; COPD chronic obstructive pulmonary disease

## 2022-06-28 NOTE — Assessment & Plan Note (Signed)
Atrophy accounts for acuity OS

## 2022-06-28 NOTE — Assessment & Plan Note (Signed)
Atrophy perifoveal nearing fovea

## 2022-06-29 ENCOUNTER — Encounter (INDEPENDENT_AMBULATORY_CARE_PROVIDER_SITE_OTHER): Payer: PPO | Admitting: Ophthalmology

## 2022-08-09 ENCOUNTER — Encounter (INDEPENDENT_AMBULATORY_CARE_PROVIDER_SITE_OTHER): Payer: Self-pay | Admitting: Ophthalmology

## 2022-08-09 ENCOUNTER — Ambulatory Visit (INDEPENDENT_AMBULATORY_CARE_PROVIDER_SITE_OTHER): Payer: PPO | Admitting: Ophthalmology

## 2022-08-09 DIAGNOSIS — H353211 Exudative age-related macular degeneration, right eye, with active choroidal neovascularization: Secondary | ICD-10-CM | POA: Diagnosis not present

## 2022-08-09 DIAGNOSIS — H353113 Nonexudative age-related macular degeneration, right eye, advanced atrophic without subfoveal involvement: Secondary | ICD-10-CM

## 2022-08-09 MED ORDER — BEVACIZUMAB CHEMO INJECTION 1.25MG/0.05ML SYRINGE FOR KALEIDOSCOPE
1.2500 mg | INTRAVITREAL | Status: AC | PRN
  Administered 2022-08-09: 1.25 mg via INTRAVITREAL

## 2022-08-09 NOTE — Assessment & Plan Note (Signed)
Perifoveal graphic atrophy.

## 2022-08-09 NOTE — Assessment & Plan Note (Signed)
Juxta foveal CNVM superior to FAZ vastly improved over the last several months.  Today again improving at 6-week interval post Avastin.  We will have patient return for follow-up visit in 4-6 weeks due to change in practice status.  Thereafter extend interval to 6 to 8 weeks

## 2022-08-09 NOTE — Progress Notes (Signed)
08/09/2022     CHIEF COMPLAINT Patient presents for  Chief Complaint  Patient presents with   Macular Degeneration      HISTORY OF PRESENT ILLNESS: Miranda Ford is a 88 y.o. female who presents to the clinic today for:   HPI   6 weeks for DILATE OD, AVASTIN OCT. Pt stated vision has remained stable since last visit.  Last edited by Silvestre Moment on 08/09/2022 10:31 AM.      Referring physician: Sharilyn Sites, MD [address] [place],  [place] [address]  HISTORICAL INFORMATION:   Selected notes from the [facility]: No current outpatient medications on file. (Ophthalmic Drugs)   No current facility-administered medications for this visit. (Ophthalmic Drugs)   Current Outpatient Medications (Other)  Medication Sig   acetaminophen (TYLENOL) 325 MG tablet Take 1-2 tablets (325-650 mg total) by mouth every 6 (six) hours as needed for mild pain (pain score 1-3 or temp > 100.5). (Patient not taking: Reported on 06/23/2022)   amiodarone (PACERONE) 200 MG tablet TAKE (1/2) TABLET BY MOUTH ONCE DAILY.   apixaban (ELIQUIS) 2.5 MG TABS tablet TAKE 1 TABLET BY MOUTH AT 10AM AND 1 TABLET AT 9PM.   atorvastatin (LIPITOR) 40 MG tablet TAKE (1) TABLET BY MOUTH AT BEDTIME.   calcium carbonate (OSCAL) 1500 (600 Ca) MG TABS tablet Take 1,500 mg by mouth daily.    carvedilol (COREG) 3.125 MG tablet TAKE 1 TABLET BY MOUTH TWICE DAILY WITH A MEAL. (Patient taking differently: Take 3.125 mg by mouth 2 (two) times daily with a meal.)   Multiple Vitamins-Minerals (ICAPS PO) Take 1 capsule by mouth daily.  (Patient not taking: Reported on 06/23/2022)   spironolactone (ALDACTONE) 25 MG tablet Take 0.5 tablets (12.5 mg total) by mouth daily.   No current facility-administered medications for this visit. (Other)      REVIEW OF SYSTEMS: ROS   Negative for: Constitutional, Gastrointestinal, Neurological, Skin, Genitourinary, Musculoskeletal, HENT, Endocrine,  Cardiovascular, Eyes, Respiratory, Psychiatric, Allergic/Imm, Heme/Lymph Last edited by Silvestre Moment on 08/09/2022 10:31 AM.       ALLERGIES Allergies  Allergen Reactions   Codeine Nausea Only   Penicillins Other (See Comments)    "Not allergic just a lot of my family members are allergic". Has patient had a PCN reaction causing immediate rash, facial/tongue/throat swelling, SOB or lightheadedness with hypotension: No Has patient had a PCN reaction causing severe rash involving mucus membranes or skin necrosis: No Has patient had a PCN reaction that required hospitalization No Has patient had a PCN reaction occurring within the last 10 years: No If all of the above answers are "NO", then may proceed with Cephalosporin use.     PAST MEDICAL HISTORY Past Medical History:  Diagnosis Date   Cataracts, bilateral    Chronic systolic CHF (congestive heart failure) ([facility])    a. dx 06/2016 - conservative rx recommended. EF 20% with biventricular failure in setting of PE.   Closed fracture of left proximal humerus 07/11/2019   Demand ischemia ([place])    Epistaxis    Macular degeneration    Dry OD; wet OS   Osteoporosis    07/18/16 previously on Premarin; S/P > 10 years Alendronate   Paroxysmal atrial tachycardia ([place])    a. placed on amiodarone 06/2016.   Pulmonary emboli ([place]) 06/2016   Past Surgical History:  Procedure Laterality Date   CATARACT EXTRACTION     bilaterally   CHOLECYSTECTOMY  INTRAMEDULLARY (IM) NAIL INTERTROCHANTERIC Left 07/11/2019   Procedure: INTRAMEDULLARY (IM) NAIL INTERTROCHANTRIC;  Surgeon: Altamese Bolivar, MD;  Location: [facility];  Service: Orthopedics;  Laterality: Left;   TONSILLECTOMY      FAMILY HISTORY Family History  Problem Relation Age of Onset   Heart disease Mother    Stroke Mother    Diabetes Father    Cancer Sister        1 sister with metastatic bone cancer; 1 with breast cancer    SOCIAL HISTORY Social History   Tobacco Use   Smoking status:  Former    Types: Cigarettes    Quit date: 1975    Years since quitting: 48.6   Smokeless tobacco: Never  Vaping Use   Vaping Use: Never used  Substance Use Topics   Alcohol use: No   Drug use: No         OPHTHALMIC EXAM:  Base Eye Exam     Visual Acuity (ETDRS)       Right Left   Dist [place] 20/150 CF at 2'   Dist ph [place] NI          Tonometry (Tonopen, 10:35 AM)       Right Left   Pressure 16 17         Pupils       Pupils APD   Right PERRL None   Left PERRL None         Visual Fields       Left Right   Restrictions Partial inner superior temporal, inferior temporal, superior nasal, inferior nasal deficiencies Partial inner superior temporal, inferior temporal, superior nasal, inferior nasal deficiencies         Extraocular Movement       Right Left    Full Full         Neuro/Psych     Oriented x3: Yes   Mood/Affect: Normal         Dilation     Right eye: 2.5% Phenylephrine, 1.0% Mydriacyl @ 10:35 AM           Slit Lamp and Fundus Exam     External Exam       Right Left   External Normal Normal         Slit Lamp Exam       Right Left   Lids/Lashes Normal Normal   Conjunctiva/Sclera White and quiet White and quiet   Cornea Clear Clear   Anterior Chamber Deep and quiet Deep and quiet   Iris Round and reactive Round and reactive   Lens Centered posterior chamber intraocular lens Centered posterior chamber intraocular lens   Anterior Vitreous Normal Normal         Fundus Exam       Right Left   Posterior Vitreous Posterior vitreous detachment    Disc Peripapillary atrophy    C/D Ratio 0.35    Macula Pigmented atrophy, near the FAZ, no exudates, Intermediate age related macular degeneration, Subretinal neovascular membrane, Intraretinal hemorrhage, Subretinal hemorrhage    Vessels Normal    Periphery Normal             IMAGING AND PROCEDURES  Imaging and Procedures for 08/09/22  OCT, Retina - OU - Both Eyes        Right Eye Quality was borderline. Central Foveal Thickness: 229. Progression has improved. Findings include abnormal foveal contour, subretinal hyper-reflective material, choroidal neovascular membrane, cystoid macular edema, subretinal fluid, central retinal atrophy.   Left  Eye Quality was good. Central Foveal Thickness: 249. Progression has been stable. Findings include abnormal foveal contour, retinal drusen , central retinal atrophy, inner retinal atrophy, outer retinal atrophy.   Notes OD with macular thickening from subretinal hemorrhage CNVM, superonasal to FAZ, now much smaller as compared to previous examination posttreatment.  Repeat injection today and she will reevaluate again in 4-6 weeks      Intravitreal Injection, Pharmacologic Agent - OD - Right Eye       Time Out 08/09/2022. 11:07 AM. Confirmed correct patient, procedure, site, and patient consented.   Anesthesia Topical anesthesia was used. Anesthetic medications included Lidocaine 4%.   Procedure Preparation included 5% betadine to ocular surface, 10% betadine to eyelids, Tobramycin 0.3%. A 30 gauge needle was used.   Injection: 1.25 mg Bevacizumab 1.'25mg'$ /0.86m   Route: Intravitreal, Site: Right Eye   NDC: [id number] Lot:: [id number] Expiration date: 11/08/2022   Post-op Post injection exam found visual acuity of at least counting fingers. The patient tolerated the procedure well. There were no complications. The patient received written and verbal post procedure care education. Post injection medications included ocuflox.              ASSESSMENT/PLAN:  Advanced nonexudative age-related macular degeneration of right eye without subfoveal involvement Perifoveal graphic atrophy.  Exudative age-related macular degeneration of right eye with active choroidal neovascularization (HCC) Juxta foveal CNVM superior to [place] improved over the last several months.  Today again improving at 6-week  interval post Avastin.  We will have patient return for follow-up visit in 4-6 weeks due to change in practice status.  Thereafter extend interval to 6 to 8 weeks     ICD-10-CM   1. Exudative age-related macular degeneration of right eye with active choroidal neovascularization (HCC)  H35.3211 OCT, Retina - OU - Both Eyes    Intravitreal Injection, Pharmacologic Agent - OD - Right Eye    Bevacizumab (AVASTIN) SOLN 1.25 mg    2. Advanced nonexudative age-related macular degeneration of right eye without subfoveal involvement  H35.3113       1.  OD doing very well preserved acuity.  Perifoveal CNVM vastly improved over the last several months on intravitreal Avastin maintained at 6-week interval.  Repeat injection today maintain similar interval next and extend interval thereafter  2.  Dry AMD continue to monitor  3.  Ophthalmic Meds Ordered this visit:  Meds ordered this encounter  Medications   Bevacizumab (AVASTIN) SOLN 1.25 mg       Return for RV 4 to 6 weeks, dilate OD Avastin OCT, dilate, AVASTIN OCT.  There are no Patient Instructions on file for this visit.   Explained the diagnoses, plan, and follow up with the patient and they expressed understanding.  Patient expressed understanding of the importance of proper follow up care.   GClent DemarkRankin M.D. Diseases & Surgery of the Retina and Vitreous Retina & Diabetic [place]08/16/23     Abbreviations: M myopia (nearsighted); A astigmatism; H hyperopia (farsighted); P presbyopia; Mrx spectacle prescription;  CTL contact lenses; OD right eye; OS left eye; OU both eyes  XT exotropia; ET esotropia; PEK punctate epithelial keratitis; PEE punctate epithelial erosions; DES dry eye syndrome; MGD meibomian gland dysfunction; ATs artificial tears; PFAT's preservative free artificial tears; [place] sclerotic cataract; PSC posterior subcapsular cataract; ERM epi-retinal membrane; PVD posterior vitreous detachment; RD retinal  detachment; DM diabetes mellitus; DR diabetic retinopathy; NPDR non-proliferative diabetic retinopathy; PDR proliferative diabetic retinopathy; CSME clinically significant macular  edema; DME diabetic macular edema; dbh dot blot hemorrhages; CWS cotton wool spot; POAG primary open angle glaucoma; C/D cup-to-disc ratio; HVF humphrey visual field; GVF goldmann visual field; OCT optical coherence tomography; IOP intraocular pressure; BRVO Branch retinal vein occlusion; CRVO central retinal vein occlusion; CRAO central retinal artery occlusion; BRAO branch retinal artery occlusion; RT retinal tear; SB scleral buckle; PPV pars plana vitrectomy; VH Vitreous hemorrhage; PRP panretinal laser photocoagulation; IVK intravitreal kenalog; VMT vitreomacular traction; MH Macular hole;  NVD neovascularization of the disc; NVE neovascularization elsewhere; AREDS age related eye disease study; ARMD age related macular degeneration; POAG primary open angle glaucoma; EBMD epithelial/anterior basement membrane dystrophy; ACIOL anterior chamber intraocular lens; IOL intraocular lens; PCIOL posterior chamber intraocular lens; Phaco/IOL phacoemulsification with intraocular lens placement; [place] photorefractive keratectomy; LASIK laser assisted in situ keratomileusis; HTN hypertension; DM diabetes mellitus; COPD chronic obstructive pulmonary disease

## 2022-08-21 ENCOUNTER — Other Ambulatory Visit: Payer: Self-pay | Admitting: Cardiology

## 2022-09-05 ENCOUNTER — Other Ambulatory Visit: Payer: Self-pay | Admitting: Cardiology

## 2022-09-05 NOTE — Telephone Encounter (Signed)
Prescription refill request for Eliquis received. Indication:  PAF Last office visit: 06/23/22  B Strader PA-C Scr: 1.10 on 05/04/22 Age: 88 Weight: 52.8kg  Based on above findings Eliquis 2.'5mg'$  twice daily is the appropriate dose.  Refill approved.

## 2022-09-20 ENCOUNTER — Encounter (INDEPENDENT_AMBULATORY_CARE_PROVIDER_SITE_OTHER): Payer: Self-pay | Admitting: Ophthalmology

## 2022-09-20 ENCOUNTER — Ambulatory Visit (INDEPENDENT_AMBULATORY_CARE_PROVIDER_SITE_OTHER): Payer: PPO | Admitting: Ophthalmology

## 2022-09-20 DIAGNOSIS — H43813 Vitreous degeneration, bilateral: Secondary | ICD-10-CM | POA: Diagnosis not present

## 2022-09-20 DIAGNOSIS — H353211 Exudative age-related macular degeneration, right eye, with active choroidal neovascularization: Secondary | ICD-10-CM | POA: Diagnosis not present

## 2022-09-20 DIAGNOSIS — H353124 Nonexudative age-related macular degeneration, left eye, advanced atrophic with subfoveal involvement: Secondary | ICD-10-CM

## 2022-09-20 DIAGNOSIS — H353113 Nonexudative age-related macular degeneration, right eye, advanced atrophic without subfoveal involvement: Secondary | ICD-10-CM

## 2022-09-20 MED ORDER — BEVACIZUMAB CHEMO INJECTION 1.25MG/0.05ML SYRINGE FOR KALEIDOSCOPE
2.5000 mg | INTRAVITREAL | Status: AC | PRN
  Administered 2022-09-20: 2.5 mg via INTRAVITREAL

## 2022-09-20 NOTE — Assessment & Plan Note (Signed)
Today vastly improved lesion, currently at 6-week interval post Avastin.  We will repeat injection today and reevaluate next in 6 to 7 weeks

## 2022-09-20 NOTE — Addendum Note (Signed)
Addended by: Deloria Lair A on: 09/20/2022 05:16 PM   Modules accepted: Orders

## 2022-09-20 NOTE — Assessment & Plan Note (Signed)
Accounts for acuity OS

## 2022-09-20 NOTE — Assessment & Plan Note (Signed)
Physiologic OU 

## 2022-09-20 NOTE — Patient Instructions (Signed)
Patient and family instructed to call 1 to 2 weeks prior to next visit to confirm we have contracting ability with [employer].

## 2022-09-20 NOTE — Assessment & Plan Note (Signed)
Extensive perifoveal OD

## 2022-09-20 NOTE — Progress Notes (Addendum)
09/20/2022     CHIEF COMPLAINT Patient presents for  Chief Complaint  Patient presents with   Macular Degeneration      HISTORY OF PRESENT ILLNESS: Miranda Ford is a 88 y.o. female who presents to the clinic today for:   HPI   6 WEEKS FOR DILATE OD, AVASTIN OCT. Pt stated, "I have noticed some improvements. When I'm looking at the telephone digits, I used to be able to see only 2 digits our of 4. And now I can see 4 digits. When I'm outside and its sunny, I can see a little clearly. But I still cannot see to read or write."  Last edited by Silvestre Moment on 09/20/2022 10:43 AM.      Referring physician: Sharilyn Sites, MD [address] [place],  [place] [address]  HISTORICAL INFORMATION:   Selected notes from the [facility]: No current outpatient medications on file. (Ophthalmic Drugs)   No current facility-administered medications for this visit. (Ophthalmic Drugs)   Current Outpatient Medications (Other)  Medication Sig   acetaminophen (TYLENOL) 325 MG tablet Take 1-2 tablets (325-650 mg total) by mouth every 6 (six) hours as needed for mild pain (pain score 1-3 or temp > 100.5). (Patient not taking: Reported on 06/23/2022)   amiodarone (PACERONE) 200 MG tablet TAKE (1/2) TABLET BY MOUTH ONCE DAILY.   apixaban (ELIQUIS) 2.5 MG TABS tablet Take 1 tablet (2.5 mg total) by mouth 2 (two) times daily.   atorvastatin (LIPITOR) 40 MG tablet TAKE (1) TABLET BY MOUTH AT BEDTIME.   calcium carbonate (OSCAL) 1500 (600 Ca) MG TABS tablet Take 1,500 mg by mouth daily.    carvedilol (COREG) 3.125 MG tablet TAKE 1 TABLET BY MOUTH TWICE DAILY WITH A MEAL. (Patient taking differently: Take 3.125 mg by mouth 2 (two) times daily with a meal.)   Multiple Vitamins-Minerals (ICAPS PO) Take 1 capsule by mouth daily.  (Patient not taking: Reported on 06/23/2022)   spironolactone (ALDACTONE) 25 MG tablet Take 0.5 tablets (12.5 mg total) by mouth daily.   No  current facility-administered medications for this visit. (Other)      REVIEW OF SYSTEMS: ROS   Negative for: Constitutional, Gastrointestinal, Neurological, Skin, Genitourinary, Musculoskeletal, HENT, Endocrine, Cardiovascular, Eyes, Respiratory, Psychiatric, Allergic/Imm, Heme/Lymph Last edited by Silvestre Moment on 09/20/2022 10:43 AM.       ALLERGIES Allergies  Allergen Reactions   Codeine Nausea Only   Penicillins Other (See Comments)    "Not allergic just a lot of my family members are allergic". Has patient had a PCN reaction causing immediate rash, facial/tongue/throat swelling, SOB or lightheadedness with hypotension: No Has patient had a PCN reaction causing severe rash involving mucus membranes or skin necrosis: No Has patient had a PCN reaction that required hospitalization No Has patient had a PCN reaction occurring within the last 10 years: No If all of the above answers are "NO", then may proceed with Cephalosporin use.     PAST MEDICAL HISTORY Past Medical History:  Diagnosis Date   Cataracts, bilateral    Chronic systolic CHF (congestive heart failure) ([place])    a. dx 06/2016 - conservative rx recommended. EF 20% with biventricular failure in setting of PE.   Closed fracture of left proximal humerus 07/11/2019   Demand ischemia ([facility])    Epistaxis    Macular degeneration    Dry OD; wet OS   Osteoporosis    07/18/16 previously on Premarin; S/P >  10 years Alendronate   Paroxysmal atrial tachycardia ([place])    a. placed on amiodarone 06/2016.   Pulmonary emboli ([place]) 06/2016   Past Surgical History:  Procedure Laterality Date   CATARACT EXTRACTION     bilaterally   CHOLECYSTECTOMY     INTRAMEDULLARY (IM) NAIL INTERTROCHANTERIC Left 07/11/2019   Procedure: INTRAMEDULLARY (IM) NAIL INTERTROCHANTRIC;  Surgeon: Altamese Arkport, MD;  Location: [facility];  Service: Orthopedics;  Laterality: Left;   TONSILLECTOMY      FAMILY HISTORY Family History  Problem Relation Age of  Onset   Heart disease Mother    Stroke Mother    Diabetes Father    Cancer Sister        1 sister with metastatic bone cancer; 1 with breast cancer    SOCIAL HISTORY Social History   Tobacco Use   Smoking status: Former    Types: Cigarettes    Quit date: 1975    Years since quitting: 48.7   Smokeless tobacco: Never  Vaping Use   Vaping Use: Never used  Substance Use Topics   Alcohol use: No   Drug use: No         OPHTHALMIC EXAM:  Base Eye Exam     Visual Acuity (ETDRS)       Right Left   Dist [place] 20/80 -2 CF at 2'   Dist ph  20/70 -2          Tonometry (Tonopen, 10:48 AM)       Right Left   Pressure 16 17         Pupils       Pupils APD   Right PERRL None   Left PERRL None         Visual Fields       Left Right   Restrictions Partial inner superior temporal, inferior temporal, superior nasal, inferior nasal deficiencies Partial inner superior temporal, inferior temporal, superior nasal, inferior nasal deficiencies         Extraocular Movement       Right Left    Full Full         Neuro/Psych     Oriented x3: Yes   Mood/Affect: Normal         Dilation     Right eye: 2.5% Phenylephrine, 1.0% Mydriacyl @ 10:48 AM           Slit Lamp and Fundus Exam     External Exam       Right Left   External Normal Normal         Slit Lamp Exam       Right Left   Lids/Lashes Normal Normal   Conjunctiva/Sclera White and quiet White and quiet   Cornea Clear Clear   Anterior Chamber Deep and quiet Deep and quiet   Iris Round and reactive Round and reactive   Lens Centered posterior chamber intraocular lens Centered posterior chamber intraocular lens   Anterior Vitreous Normal Normal         Fundus Exam       Right Left   Posterior Vitreous Posterior vitreous detachment    Disc Peripapillary atrophy    C/D Ratio 0.35    Macula Pigmented atrophy, near the FAZ, no exudates, Intermediate age related macular degeneration,  Subretinal neovascular membrane, Intraretinal hemorrhage, Subretinal hemorrhage    Vessels Normal    Periphery Normal             IMAGING AND PROCEDURES  Imaging and Procedures  for 09/20/22  OCT, Retina - OU - Both Eyes       Right Eye Quality was good. Scan locations included subfoveal. Central Foveal Thickness: 228. Progression has improved. Findings include abnormal foveal contour, subretinal hyper-reflective material, choroidal neovascular membrane, cystoid macular edema, subretinal fluid, central retinal atrophy.   Left Eye Quality was good. Scan locations included subfoveal. Central Foveal Thickness: 244. Progression has been stable. Findings include abnormal foveal contour, retinal drusen , central retinal atrophy, inner retinal atrophy, outer retinal atrophy.   Notes OD much improved macular condition as compared to onset of lesion activity April 2023.  No active disease detected today at 6-week interval.     Intravitreal Injection, Pharmacologic Agent - OD - Right Eye       Time Out 09/20/2022. 5:14 PM. Confirmed correct patient, procedure, site, and patient consented.   Anesthesia Topical anesthesia was used. Anesthetic medications included Lidocaine 4%.   Procedure Preparation included 5% betadine to ocular surface, 10% betadine to eyelids, Tobramycin 0.3%. A 30 gauge needle was used.   Injection: 2.5 mg Bevacizumab 1.'25mg'$ /0.72m   Route: Intravitreal, Site: Right Eye   NDC: [id number] Lot: [id number]  Post-op Post injection exam found visual acuity of at least counting fingers. The patient tolerated the procedure well. There were no complications. The patient received written and verbal post procedure care education. Post injection medications included ocuflox.              ASSESSMENT/PLAN:  Exudative age-related macular degeneration of right eye with active choroidal neovascularization ([place] Today vastly improved lesion, currently at 6-week  interval post Avastin.  We will repeat injection today and reevaluate next in 6 to 7 weeks  Posterior vitreous detachment of both eyes Physiologic OU  Advanced nonexudative age-related macular degeneration of left eye with subfoveal involvement Accounts for acuity OS  Advanced nonexudative age-related macular degeneration of right eye without subfoveal involvement Extensive perifoveal OD     ICD-10-CM   1. Exudative age-related macular degeneration of right eye with active choroidal neovascularization (HCC)  H35.3211 OCT, Retina - OU - Both Eyes    Intravitreal Injection, Pharmacologic Agent - OD - Right Eye    Bevacizumab (AVASTIN) SOLN 2.5 mg    2. Posterior vitreous detachment of both eyes  H43.813     3. Advanced nonexudative age-related macular degeneration of left eye with subfoveal involvement  H35.3124     4. Advanced nonexudative age-related macular degeneration of right eye without subfoveal involvement  H35.3113       1.  Repeat injection intravitreal Avastin OD today reevaluate next in 7 weeks  2.  To contact the office promptly new onset visual acuity declines or discomfort or distortions  3.  Ophthalmic Meds Ordered this visit:  Meds ordered this encounter  Medications   Bevacizumab (AVASTIN) SOLN 2.5 mg       Return in about 7 weeks (around 11/08/2022) for dilate, AVASTIN OCT.  Patient Instructions  Patient and family instructed to call 1 to 2 weeks prior to next visit to confirm we have contracting ability with [employer].     Explained the diagnoses, plan, and follow up with the patient and they expressed understanding.  Patient expressed understanding of the importance of proper follow up care.   GClent DemarkRankin M.D. Diseases & Surgery of the Retina and Vitreous Retina & Diabetic [place]rooked [place]09/27/23     Abbreviations: M myopia (nearsighted); A astigmatism; H hyperopia (farsighted); P presbyopia; Mrx spectacle prescription;  CTL  contact lenses; OD right eye; OS left eye; OU both eyes  XT exotropia; ET esotropia; PEK punctate epithelial keratitis; PEE punctate epithelial erosions; DES dry eye syndrome; MGD meibomian gland dysfunction; ATs artificial tears; PFAT's preservative free artificial tears; [place] nuclear sclerotic cataract; PSC posterior subcapsular cataract; ERM epi-retinal membrane; PVD posterior vitreous detachment; RD retinal detachment; DM diabetes mellitus; DR diabetic retinopathy; NPDR non-proliferative diabetic retinopathy; PDR proliferative diabetic retinopathy; CSME clinically significant macular edema; DME diabetic macular edema; dbh dot blot hemorrhages; CWS cotton wool spot; POAG primary open angle glaucoma; C/D cup-to-disc ratio; HVF humphrey visual field; GVF goldmann visual field; OCT optical coherence tomography; IOP intraocular pressure; BRVO Branch retinal vein occlusion; CRVO central retinal vein occlusion; CRAO central retinal artery occlusion; BRAO branch retinal artery occlusion; RT retinal tear; SB scleral buckle; PPV pars plana vitrectomy; VH Vitreous hemorrhage; PRP panretinal laser photocoagulation; IVK intravitreal kenalog; VMT vitreomacular traction; MH Macular hole;  NVD neovascularization of the disc; NVE neovascularization elsewhere; AREDS age related eye disease study; ARMD age related macular degeneration; POAG primary open angle glaucoma; EBMD epithelial/anterior basement membrane dystrophy; ACIOL anterior chamber intraocular lens; IOL intraocular lens; PCIOL posterior chamber intraocular lens; Phaco/IOL phacoemulsification with intraocular lens placement; [place] photorefractive keratectomy; LASIK laser assisted in situ keratomileusis; HTN hypertension; DM diabetes mellitus; COPD chronic obstructive pulmonary disease

## 2022-10-09 DIAGNOSIS — Z961 Presence of intraocular lens: Secondary | ICD-10-CM | POA: Diagnosis not present

## 2022-10-09 DIAGNOSIS — H353211 Exudative age-related macular degeneration, right eye, with active choroidal neovascularization: Secondary | ICD-10-CM | POA: Diagnosis not present

## 2022-10-09 DIAGNOSIS — H5201 Hypermetropia, right eye: Secondary | ICD-10-CM | POA: Diagnosis not present

## 2022-10-09 DIAGNOSIS — H524 Presbyopia: Secondary | ICD-10-CM | POA: Diagnosis not present

## 2022-10-09 DIAGNOSIS — H52201 Unspecified astigmatism, right eye: Secondary | ICD-10-CM | POA: Diagnosis not present

## 2022-10-09 DIAGNOSIS — H353134 Nonexudative age-related macular degeneration, bilateral, advanced atrophic with subfoveal involvement: Secondary | ICD-10-CM | POA: Diagnosis not present

## 2022-11-08 ENCOUNTER — Encounter (INDEPENDENT_AMBULATORY_CARE_PROVIDER_SITE_OTHER): Payer: PPO | Admitting: Ophthalmology

## 2022-11-08 DIAGNOSIS — H353113 Nonexudative age-related macular degeneration, right eye, advanced atrophic without subfoveal involvement: Secondary | ICD-10-CM | POA: Diagnosis not present

## 2022-11-08 DIAGNOSIS — H26491 Other secondary cataract, right eye: Secondary | ICD-10-CM | POA: Diagnosis not present

## 2022-11-08 DIAGNOSIS — H43813 Vitreous degeneration, bilateral: Secondary | ICD-10-CM | POA: Diagnosis not present

## 2022-11-08 DIAGNOSIS — H3561 Retinal hemorrhage, right eye: Secondary | ICD-10-CM | POA: Diagnosis not present

## 2022-11-08 DIAGNOSIS — H353124 Nonexudative age-related macular degeneration, left eye, advanced atrophic with subfoveal involvement: Secondary | ICD-10-CM | POA: Diagnosis not present

## 2022-11-08 DIAGNOSIS — H353211 Exudative age-related macular degeneration, right eye, with active choroidal neovascularization: Secondary | ICD-10-CM | POA: Diagnosis not present

## 2022-11-09 ENCOUNTER — Encounter (INDEPENDENT_AMBULATORY_CARE_PROVIDER_SITE_OTHER): Payer: PPO | Admitting: Ophthalmology

## 2022-11-09 DIAGNOSIS — L570 Actinic keratosis: Secondary | ICD-10-CM | POA: Diagnosis not present

## 2022-11-09 DIAGNOSIS — X32XXXD Exposure to sunlight, subsequent encounter: Secondary | ICD-10-CM | POA: Diagnosis not present

## 2022-11-09 DIAGNOSIS — L708 Other acne: Secondary | ICD-10-CM | POA: Diagnosis not present

## 2022-11-14 ENCOUNTER — Other Ambulatory Visit: Payer: Self-pay | Admitting: Student

## 2022-11-14 ENCOUNTER — Telehealth: Payer: Self-pay | Admitting: Cardiology

## 2022-11-14 NOTE — Telephone Encounter (Signed)
   Have they been able to track weights at home? If not, would encourage to do so. Would also limit sodium intake. She previously had an Rx for Lasix '20mg'$  PRN but had not utilized this recently at the time of her office visit in 05/2022. Would recommend taking for the next 3-4 days and let us know how she is doing afterwards (can send in updated Rx if needed). She is already on Spironolactone and had issues with hyperkalemia in the past so would hold off on adding additional K+ supplementation at this time.   Signed, Erma Heritage, PA-C 11/14/2022, 8:36 AM

## 2022-11-14 NOTE — Telephone Encounter (Signed)
Spoke to pt's niece who stated that pt has been wheezing and having sob at night, especially when lying on her back to sleep. Pt denies any other symptoms but is concerned that sob/wheezing is coming from her HF. Pt's niece stated that pt has not had any recent sickness.  Please advise.

## 2022-11-14 NOTE — Telephone Encounter (Signed)
Pt c/o Shortness Of Breath: STAT if SOB developed within the last 24 hours or pt is noticeably SOB on the phone  1. Are you currently SOB (can you hear that pt is SOB on the phone)? Spoke to niece.  Niece believes it is only happening at night.   2. How long have you been experiencing SOB? SOB only at night when she lays on her back to try to sleep, for the past 2-3 days.   3. Are you SOB when sitting or when up moving around? When she is laying down.   4. Are you currently experiencing any other symptoms? Some wheezing, niece doesn't think she is having any other symptoms.

## 2022-11-14 NOTE — Telephone Encounter (Signed)
Niece notified and will track weight. Pt will utilize Lasix 20 mg for the next 3-4 days and update office.

## 2022-11-20 ENCOUNTER — Other Ambulatory Visit: Payer: Self-pay | Admitting: Cardiology

## 2022-12-04 DIAGNOSIS — W19XXXA Unspecified fall, initial encounter: Secondary | ICD-10-CM | POA: Diagnosis not present

## 2022-12-04 DIAGNOSIS — Z1331 Encounter for screening for depression: Secondary | ICD-10-CM | POA: Diagnosis not present

## 2022-12-04 DIAGNOSIS — E119 Type 2 diabetes mellitus without complications: Secondary | ICD-10-CM | POA: Diagnosis not present

## 2022-12-04 DIAGNOSIS — M810 Age-related osteoporosis without current pathological fracture: Secondary | ICD-10-CM | POA: Diagnosis not present

## 2022-12-04 DIAGNOSIS — E7849 Other hyperlipidemia: Secondary | ICD-10-CM | POA: Diagnosis not present

## 2022-12-04 DIAGNOSIS — Z0000 Encounter for general adult medical examination without abnormal findings: Secondary | ICD-10-CM | POA: Diagnosis not present

## 2022-12-04 DIAGNOSIS — I509 Heart failure, unspecified: Secondary | ICD-10-CM | POA: Diagnosis not present

## 2022-12-04 DIAGNOSIS — Z681 Body mass index (BMI) 19 or less, adult: Secondary | ICD-10-CM | POA: Diagnosis not present

## 2022-12-04 DIAGNOSIS — N183 Chronic kidney disease, stage 3 unspecified: Secondary | ICD-10-CM | POA: Diagnosis not present

## 2022-12-04 DIAGNOSIS — I517 Cardiomegaly: Secondary | ICD-10-CM | POA: Diagnosis not present

## 2022-12-04 DIAGNOSIS — Z23 Encounter for immunization: Secondary | ICD-10-CM | POA: Diagnosis not present

## 2022-12-04 DIAGNOSIS — I482 Chronic atrial fibrillation, unspecified: Secondary | ICD-10-CM | POA: Diagnosis not present

## 2022-12-04 DIAGNOSIS — E559 Vitamin D deficiency, unspecified: Secondary | ICD-10-CM | POA: Diagnosis not present

## 2022-12-20 DIAGNOSIS — H26491 Other secondary cataract, right eye: Secondary | ICD-10-CM | POA: Diagnosis not present

## 2022-12-20 DIAGNOSIS — H353211 Exudative age-related macular degeneration, right eye, with active choroidal neovascularization: Secondary | ICD-10-CM | POA: Diagnosis not present

## 2022-12-20 DIAGNOSIS — H3561 Retinal hemorrhage, right eye: Secondary | ICD-10-CM | POA: Diagnosis not present

## 2022-12-20 DIAGNOSIS — H353124 Nonexudative age-related macular degeneration, left eye, advanced atrophic with subfoveal involvement: Secondary | ICD-10-CM | POA: Diagnosis not present

## 2022-12-20 DIAGNOSIS — H43813 Vitreous degeneration, bilateral: Secondary | ICD-10-CM | POA: Diagnosis not present

## 2022-12-20 DIAGNOSIS — H353113 Nonexudative age-related macular degeneration, right eye, advanced atrophic without subfoveal involvement: Secondary | ICD-10-CM | POA: Diagnosis not present

## 2023-01-24 ENCOUNTER — Encounter: Payer: Self-pay | Admitting: Cardiology

## 2023-01-24 ENCOUNTER — Ambulatory Visit: Payer: PPO | Attending: Cardiology | Admitting: Cardiology

## 2023-01-24 VITALS — BP 128/80 | HR 56 | Ht 63.5 in | Wt 115.0 lb

## 2023-01-24 DIAGNOSIS — I350 Nonrheumatic aortic (valve) stenosis: Secondary | ICD-10-CM

## 2023-01-24 DIAGNOSIS — I5020 Unspecified systolic (congestive) heart failure: Secondary | ICD-10-CM | POA: Diagnosis not present

## 2023-01-24 DIAGNOSIS — I4719 Other supraventricular tachycardia: Secondary | ICD-10-CM

## 2023-01-24 NOTE — Progress Notes (Signed)
Cardiology Office Note  Date: 01/24/2023   ID: Miranda Ford, DOB 04-15-1924, MRN [id number]  PCP:  Sharilyn Sites, MD  Cardiologist:  Rozann Lesches, MD Electrophysiologist:  None   Chief Complaint  Patient presents with   Cardiac follow-up    History of Present Illness: Miranda Ford is a 89 y.o. female last seen in June 2023 by Ms. Strader PA-C, I reviewed the note.  She is here today with family member for a follow-up visit.  Continues to reside in her own home, functional with basic ADLs including light house chores and cooking.  She does use a walker at home, cane when she goes out.  No reported falls or bleeding problems on Eliquis.  She does not describe any palpitations or chest pain.  I personally reviewed her ECG which shows sinus bradycardia with left atrial enlargement and incomplete right bundle branch block.  She had lab work with PCP in December 2023 which I also reviewed.  We went over her medications.  Past Medical History:  Diagnosis Date   Cataracts, bilateral    Chronic systolic CHF (congestive heart failure) ([place])    a. dx 06/2016 - conservative rx recommended. EF 20% with biventricular failure in setting of PE.   Closed fracture of left proximal humerus 07/11/2019   Demand ischemia    Epistaxis    Macular degeneration    Dry OD; wet OS   Osteoporosis    07/18/16 previously on Premarin; S/P > 10 years Alendronate   Paroxysmal atrial tachycardia    a. placed on amiodarone 06/2016.   Pulmonary emboli (HCC) 06/2016    Current Outpatient Medications  Medication Sig Dispense Refill   acetaminophen (TYLENOL) 325 MG tablet Take 1-2 tablets (325-650 mg total) by mouth every 6 (six) hours as needed for mild pain (pain score 1-3 or temp > 100.5). 60 tablet    amiodarone (PACERONE) 200 MG tablet TAKE (1/2) TABLET BY MOUTH ONCE DAILY. 45 tablet 3   apixaban (ELIQUIS) 2.5 MG TABS tablet Take 1 tablet (2.5 mg total) by mouth 2 (two) times daily. 60 tablet 5    atorvastatin (LIPITOR) 40 MG tablet TAKE (1) TABLET BY MOUTH AT BEDTIME. 90 tablet 3   calcium carbonate (OSCAL) 1500 (600 Ca) MG TABS tablet Take 1,500 mg by mouth daily.      carvedilol (COREG) 3.125 MG tablet TAKE 1 TABLET BY MOUTH TWICE DAILY WITH A MEAL. (Patient taking differently: Take 3.125 mg by mouth 2 (two) times daily with a meal.) 60 tablet 5   furosemide (LASIX) 20 MG tablet TAKE 1 TABLET BY MOUTH ONCE A DAY. 90 tablet 0   Multiple Vitamins-Minerals (ICAPS PO) Take 1 capsule by mouth daily.     spironolactone (ALDACTONE) 25 MG tablet Take 0.5 tablets (12.5 mg total) by mouth daily. 45 tablet 3   No current facility-administered medications for this visit.   Allergies:  Codeine and Penicillins   ROS: No syncope.  Physical Exam: VS:  BP 128/80   Pulse (!) 56   Ht 5' 3.5" (1.613 m)   Wt 115 lb (52.2 kg)   BMI 20.05 kg/m , BMI Body mass index is 20.05 kg/m.  Wt Readings from Last 3 Encounters:  01/24/23 115 lb (52.2 kg)  06/23/22 116 lb 6.4 oz (52.8 kg)  12/22/21 117 lb (53.1 kg)    General: Patient appears comfortable at rest. HEENT: Conjunctiva and lids normal. Neck: Supple, no elevated JVP or carotid bruits. Lungs: Clear to auscultation, nonlabored  breathing at rest. Cardiac: Regular rate and rhythm, no S3, 2/6 systolic murmur.  ECG:  An ECG dated 11/04/2021 was personally reviewed today and demonstrated:  Sinus bradycardia with right bundle branch block and left posterior fascicular block.  Recent Labwork:  December 2023: Hemoglobin 14.1, platelets 205, BUN 19, creatinine 1.07, potassium 4.8, AST 23, ALT 14, cholesterol 128, triglycerides 52, HDL 64, LDL 52, TSH 2.48  Other Studies Reviewed Today:  No interval cardiac testing for review today.  Assessment and Plan:  1.  Paroxysmal atrial tachycardia.  She is in sinus rhythm by ECG and has had good rhythm control on amiodarone as well as low-dose Coreg.  Recent LFTs and TSH normal.  2.  Chronic  anticoagulation with history of previous pulmonary embolus.  She remains on Eliquis.  No reported bleeding problems.  3.  HFrEF with plan for conservative management per discussion with patient over time.  She remains symptomatically stable with NYHA class II dyspnea at current level of activity.  She is on Coreg and Aldactone and we have not pursued follow-up imaging studies.  Previous LVEF approximately 25%.  4.  Aortic stenosis, also conservatively managed, mild to moderate as of 2017.  She has also declined follow-up imaging studies.  Medication Adjustments/Labs and Tests Ordered: Current medicines are reviewed at length with the patient today.  Concerns regarding medicines are outlined above.   Tests Ordered: Orders Placed This Encounter  Procedures   EKG 12-Lead    Medication Changes: No orders of the defined types were placed in this encounter.   Disposition:  Follow up  6 months.  Signed, Satira Sark, MD, [facility] 01/24/2023 2:12 PM    [facility] at [facility] [address] [place], [place] [address] Phone: [phone]; Fax: [phone]

## 2023-01-24 NOTE — Patient Instructions (Signed)
Medication Instructions:  Your physician recommends that you continue on your current medications as directed. Please refer to the Current Medication list given to you today.  *If you need a refill on your cardiac medications before your next appointment, please call your pharmacy*   Lab Work: NONE   If you have labs (blood work) drawn today and your tests are completely normal, you will receive your results only by: MyChart Message (if you have MyChart) OR A paper copy in the mail If you have any lab test that is abnormal or we need to change your treatment, we will call you to review the results.   Testing/Procedures: NONE    Follow-Up: At [facility], you and your health needs are our priority.  As part of our continuing mission to provide you with exceptional heart care, we have created designated Provider Care Teams.  These Care Teams include your primary Cardiologist (physician) and Advanced Practice Providers (APPs -  Physician Assistants and Nurse Practitioners) who all work together to provide you with the care you need, when you need it.  We recommend signing up for the patient portal called "MyChart".  Sign up information is provided on this After Visit Summary.  MyChart is used to connect with patients for Virtual Visits (Telemedicine).  Patients are able to view lab/test results, encounter notes, upcoming appointments, etc.  Non-urgent messages can be sent to your provider as well.   To learn more about what you can do with MyChart, go to [email or web address].    Your next appointment:   6 month(s)  Provider:   Samuel McDowell, MD    Other Instructions Thank you for choosing [facility]!    

## 2023-02-14 DIAGNOSIS — H353124 Nonexudative age-related macular degeneration, left eye, advanced atrophic with subfoveal involvement: Secondary | ICD-10-CM | POA: Diagnosis not present

## 2023-02-14 DIAGNOSIS — H26491 Other secondary cataract, right eye: Secondary | ICD-10-CM | POA: Diagnosis not present

## 2023-02-14 DIAGNOSIS — H353211 Exudative age-related macular degeneration, right eye, with active choroidal neovascularization: Secondary | ICD-10-CM | POA: Diagnosis not present

## 2023-02-14 DIAGNOSIS — H3561 Retinal hemorrhage, right eye: Secondary | ICD-10-CM | POA: Diagnosis not present

## 2023-02-14 DIAGNOSIS — H43813 Vitreous degeneration, bilateral: Secondary | ICD-10-CM | POA: Diagnosis not present

## 2023-02-19 ENCOUNTER — Encounter (INDEPENDENT_AMBULATORY_CARE_PROVIDER_SITE_OTHER): Payer: Medicare Other | Admitting: Ophthalmology

## 2023-02-20 ENCOUNTER — Encounter (INDEPENDENT_AMBULATORY_CARE_PROVIDER_SITE_OTHER): Payer: Medicare Other | Admitting: Ophthalmology

## 2023-03-07 ENCOUNTER — Other Ambulatory Visit: Payer: Self-pay | Admitting: Cardiology

## 2023-03-07 NOTE — Telephone Encounter (Signed)
Prescription refill request for Eliquis received. Indication: PE Last office visit: 01/24/23  Myles Gip MD Scr: 1.10 on 05/04/22 Age: 89 Weight: 52.2kg  Based on above findings Eliquis 2.'5mg'$  twice daily is the appropriate dose.  Refill approved.

## 2023-04-11 DIAGNOSIS — H353211 Exudative age-related macular degeneration, right eye, with active choroidal neovascularization: Secondary | ICD-10-CM | POA: Diagnosis not present

## 2023-04-11 DIAGNOSIS — H43813 Vitreous degeneration, bilateral: Secondary | ICD-10-CM | POA: Diagnosis not present

## 2023-04-11 DIAGNOSIS — H3561 Retinal hemorrhage, right eye: Secondary | ICD-10-CM | POA: Diagnosis not present

## 2023-04-11 DIAGNOSIS — H353124 Nonexudative age-related macular degeneration, left eye, advanced atrophic with subfoveal involvement: Secondary | ICD-10-CM | POA: Diagnosis not present

## 2023-04-11 DIAGNOSIS — H353113 Nonexudative age-related macular degeneration, right eye, advanced atrophic without subfoveal involvement: Secondary | ICD-10-CM | POA: Diagnosis not present

## 2023-06-06 DIAGNOSIS — H353124 Nonexudative age-related macular degeneration, left eye, advanced atrophic with subfoveal involvement: Secondary | ICD-10-CM | POA: Diagnosis not present

## 2023-06-06 DIAGNOSIS — H3561 Retinal hemorrhage, right eye: Secondary | ICD-10-CM | POA: Diagnosis not present

## 2023-06-06 DIAGNOSIS — H353211 Exudative age-related macular degeneration, right eye, with active choroidal neovascularization: Secondary | ICD-10-CM | POA: Diagnosis not present

## 2023-06-06 DIAGNOSIS — H43813 Vitreous degeneration, bilateral: Secondary | ICD-10-CM | POA: Diagnosis not present

## 2023-06-06 DIAGNOSIS — H26491 Other secondary cataract, right eye: Secondary | ICD-10-CM | POA: Diagnosis not present

## 2023-06-17 ENCOUNTER — Other Ambulatory Visit: Payer: Self-pay

## 2023-06-17 ENCOUNTER — Emergency Department (HOSPITAL_COMMUNITY)
Admission: EM | Admit: 2023-06-17 | Discharge: 2023-06-17 | Disposition: A | Discharge status: Home / Self Care | Payer: PPO | Attending: Emergency Medicine | Admitting: Emergency Medicine

## 2023-06-17 ENCOUNTER — Encounter (HOSPITAL_COMMUNITY): Payer: Self-pay

## 2023-06-17 ENCOUNTER — Emergency Department (HOSPITAL_COMMUNITY): Payer: PPO

## 2023-06-17 DIAGNOSIS — I509 Heart failure, unspecified: Secondary | ICD-10-CM | POA: Insufficient documentation

## 2023-06-17 DIAGNOSIS — S51011A Laceration without foreign body of right elbow, initial encounter: Secondary | ICD-10-CM | POA: Insufficient documentation

## 2023-06-17 DIAGNOSIS — S0003XA Contusion of scalp, initial encounter: Secondary | ICD-10-CM

## 2023-06-17 DIAGNOSIS — W1839XA Other fall on same level, initial encounter: Secondary | ICD-10-CM | POA: Insufficient documentation

## 2023-06-17 DIAGNOSIS — Y92000 Kitchen of unspecified non-institutional (private) residence as  the place of occurrence of the external cause: Secondary | ICD-10-CM | POA: Insufficient documentation

## 2023-06-17 DIAGNOSIS — W19XXXA Unspecified fall, initial encounter: Secondary | ICD-10-CM

## 2023-06-17 DIAGNOSIS — Z87891 Personal history of nicotine dependence: Secondary | ICD-10-CM | POA: Insufficient documentation

## 2023-06-17 DIAGNOSIS — S0990XA Unspecified injury of head, initial encounter: Secondary | ICD-10-CM

## 2023-06-17 DIAGNOSIS — T148XXA Other injury of unspecified body region, initial encounter: Secondary | ICD-10-CM

## 2023-06-17 DIAGNOSIS — Z7901 Long term (current) use of anticoagulants: Secondary | ICD-10-CM | POA: Diagnosis not present

## 2023-06-17 DIAGNOSIS — R9431 Abnormal electrocardiogram [ECG] [EKG]: Secondary | ICD-10-CM | POA: Diagnosis not present

## 2023-06-17 DIAGNOSIS — I10 Essential (primary) hypertension: Secondary | ICD-10-CM | POA: Diagnosis not present

## 2023-06-17 DIAGNOSIS — S51012A Laceration without foreign body of left elbow, initial encounter: Secondary | ICD-10-CM | POA: Insufficient documentation

## 2023-06-17 DIAGNOSIS — R58 Hemorrhage, not elsewhere classified: Secondary | ICD-10-CM | POA: Diagnosis not present

## 2023-06-17 DIAGNOSIS — R29810 Facial weakness: Secondary | ICD-10-CM | POA: Diagnosis not present

## 2023-06-17 LAB — CBC WITH DIFFERENTIAL/PLATELET
Abs Immature Granulocytes: 0.09 10*3/uL — ABNORMAL HIGH (ref 0.00–0.07)
Basophils Absolute: 0.1 10*3/uL (ref 0.0–0.1)
Basophils Relative: 1 %
Eosinophils Absolute: 0 10*3/uL (ref 0.0–0.5)
Eosinophils Relative: 0 %
HCT: 43.8 % (ref 36.0–46.0)
Hemoglobin: 13.8 g/dL (ref 12.0–15.0)
Immature Granulocytes: 1 %
Lymphocytes Relative: 19 %
Lymphs Abs: 1.4 10*3/uL (ref 0.7–4.0)
MCH: 31.7 pg (ref 26.0–34.0)
MCHC: 31.5 g/dL (ref 30.0–36.0)
MCV: 100.7 fL — ABNORMAL HIGH (ref 80.0–100.0)
Monocytes Absolute: 0.6 10*3/uL (ref 0.1–1.0)
Monocytes Relative: 7 %
Neutro Abs: 5.4 10*3/uL (ref 1.7–7.7)
Neutrophils Relative %: 72 %
Platelets: 198 10*3/uL (ref 150–400)
RBC: 4.35 MIL/uL (ref 3.87–5.11)
RDW: 14.3 % (ref 11.5–15.5)
WBC: 7.5 10*3/uL (ref 4.0–10.5)
nRBC: 0 % (ref 0.0–0.2)

## 2023-06-17 LAB — COMPREHENSIVE METABOLIC PANEL
ALT: 20 U/L (ref 0–44)
AST: 28 U/L (ref 15–41)
Albumin: 3.9 g/dL (ref 3.5–5.0)
Alkaline Phosphatase: 86 U/L (ref 38–126)
Anion gap: 12 (ref 5–15)
BUN: 21 mg/dL (ref 8–23)
CO2: 23 mmol/L (ref 22–32)
Calcium: 9.2 mg/dL (ref 8.9–10.3)
Chloride: 97 mmol/L — ABNORMAL LOW (ref 98–111)
Creatinine, Ser: 1.15 mg/dL — ABNORMAL HIGH (ref 0.44–1.00)
GFR, Estimated: 44 mL/min — ABNORMAL LOW (ref >60)
Glucose, Bld: 101 mg/dL — ABNORMAL HIGH (ref 70–99)
Potassium: 5 mmol/L (ref 3.5–5.1)
Sodium: 132 mmol/L — ABNORMAL LOW (ref 135–145)
Total Bilirubin: 0.9 mg/dL (ref 0.3–1.2)
Total Protein: 6.9 g/dL (ref 6.5–8.1)

## 2023-06-17 MED ORDER — BACITRACIN ZINC 500 UNIT/GM EX OINT: TOPICAL_OINTMENT | Freq: Once | CUTANEOUS | Status: AC

## 2023-06-17 MED ORDER — BACITRACIN ZINC 500 UNIT/GM EX OINT
TOPICAL_OINTMENT | CUTANEOUS | Status: AC
  Administered 2023-06-17: 1 via TOPICAL
  Filled 2023-06-17: qty 0.9

## 2023-06-17 NOTE — Progress Notes (Signed)
   06/17/23 1342  Spiritual Encounters  Type of Visit Declined chaplain visit  Referral source Trauma page  Reason for visit Trauma  OnCall Visit Yes   Patient declined chaplain visit.   Arlyce Dice, Chaplain Resident [phone]

## 2023-06-17 NOTE — ED Notes (Signed)
Discharge instructions discussed with pt. Verbalized understanding. VSS. No questions or concerns regarding discharge  

## 2023-06-17 NOTE — ED Provider Notes (Signed)
[place] [facility]GENCY DEPARTMENT AT [facility] Provider Note   CSN: [id number] Arrival date & time: 06/17/23  1321     History  Chief Complaint  Patient presents with   Marletta Lor    Miranda Ford is a 89 y.o. female.  Patient brought in by [facility].  Patient had a fall in her kitchen she fell backwards she is on Eliquis.  EMS thought there was a left facial droop.  Upon arrival here everything seemed fine.  Patient's vision is fine speech is fine of strength to upper extremities lower extremities all fine.  There is a contusion to the back of her head that is sort of a stellate about 1/2 inch may be some superficial laceration.  Bleeding now currently can controlled.  Patient quite alert for her age she lives by herself.  Family members are here.  The fall occurred at about 1:00.  Patient's medicine includes Pacerone and Lasix and Eliquis.  Past medical history significant for macular degeneration pulmonary emboli in 2017 congestive heart failure proximal atrial tachycardia and a closed left proximal humerus fracture in 2020.  Past surgical history significant for the gallbladder removal and intramedullary nail of the intertrochanteric left hip in 2020.  Patient's former smoker cigarettes quit 1975.  Patient with complaint of some head pain posteriorly patient in cervical collar not complaining of neck pain.  Has no back pain no chest pain no shortness of breath no abdominal pain no extremity pain.  But she does have some skin tears to both elbows.  No active bleeding.  Patient not sure if she lost her balance that has happened before.  She may have felt a little lightheaded.  But she did not pass out.       Home Medications Prior to Admission medications   Medication Sig Start Date End Date Taking? Authorizing Provider  acetaminophen (TYLENOL) 325 MG tablet Take 1-2 tablets (325-650 mg total) by mouth every 6 (six) hours as needed for mild pain (pain score 1-3 or temp >  100.5). 07/14/19   Montez Morita, PA-C  amiodarone (PACERONE) 200 MG tablet TAKE (1/2) TABLET BY MOUTH ONCE DAILY. 11/20/22   Jonelle Sidle, MD  apixaban (ELIQUIS) 2.5 MG TABS tablet TAKE 1 TABLET BY MOUTH AT 10AM AND 1 TABLET AT 9PM. 03/07/23   Jonelle Sidle, MD  atorvastatin (LIPITOR) 40 MG tablet TAKE (1) TABLET BY MOUTH AT BEDTIME. 08/21/22   Jonelle Sidle, MD  calcium carbonate (OSCAL) 1500 (600 Ca) MG TABS tablet Take 1,500 mg by mouth daily.     [provider]  carvedilol (COREG) 3.125 MG tablet TAKE 1 TABLET BY MOUTH TWICE DAILY WITH A MEAL. Patient taking differently: Take 3.125 mg by mouth 2 (two) times daily with a meal. 04/25/18   Jodelle Gross, NP  furosemide (LASIX) 20 MG tablet TAKE 1 TABLET BY MOUTH ONCE A DAY. 11/14/22   Strader, Lennart Pall, PA-C  Multiple Vitamins-Minerals (ICAPS PO) Take 1 capsule by mouth daily.    [provider]  spironolactone (ALDACTONE) 25 MG tablet Take 0.5 tablets (12.5 mg total) by mouth daily. 06/03/21 06/23/22  Ellsworth Lennox, PA-C      Allergies    Codeine and Penicillins    Review of Systems   Review of Systems  Constitutional:  Negative for chills and fever.  HENT:  Negative for ear pain and sore throat.   Eyes:  Positive for visual disturbance. Negative for pain.  Respiratory:  Negative  for cough and shortness of breath.   Cardiovascular:  Negative for chest pain and palpitations.  Gastrointestinal:  Negative for abdominal pain and vomiting.  Genitourinary:  Negative for dysuria and hematuria.  Musculoskeletal:  Negative for arthralgias and back pain.  Skin:  Positive for wound. Negative for color change and rash.  Neurological:  Negative for seizures and syncope.  Hematological:  Bruises/bleeds easily.  All other systems reviewed and are negative.   Physical Exam Updated Vital Signs BP (!) 159/86   Pulse 68   Temp 98.7 F (37.1 C)   Resp 19   Ht 1.6 m (5\' 3" )   Wt 52.2 kg   SpO2 97%   BMI  20.39 kg/m  Physical Exam Vitals and nursing note reviewed.  Constitutional:      General: She is not in acute distress.    Appearance: Normal appearance. She is well-developed.  HENT:     Head: Normocephalic.     Comments: Patient top of the head with 1/2 inch stellate contusion superficial laceration no active bleeding. Eyes:     Extraocular Movements: Extraocular movements intact.     Conjunctiva/sclera: Conjunctivae normal.     Pupils: Pupils are equal, round, and reactive to light.  Neck:     Comments: Cervical collar in place. Cardiovascular:     Rate and Rhythm: Normal rate and regular rhythm.     Heart sounds: No murmur heard. Pulmonary:     Effort: Pulmonary effort is normal. No respiratory distress.     Breath sounds: Normal breath sounds.  Abdominal:     Palpations: Abdomen is soft.     Tenderness: There is no abdominal tenderness.  Musculoskeletal:        General: Signs of injury present. No swelling.     Comments: Bilateral skin tears to both elbows.  Measuring about three quarters of an inch no active bleeding.  Lower extremity without any evidence of any injury.  Skin:    General: Skin is warm and dry.     Capillary Refill: Capillary refill takes less than 2 seconds.  Neurological:     General: No focal deficit present.     Mental Status: She is alert and oriented to person, place, and time.     Cranial Nerves: No cranial nerve deficit.     Sensory: No sensory deficit.     Motor: No weakness.     Comments: No evidence of any focal neurodeficit that no evidence of any facial droop.  Psychiatric:        Mood and Affect: Mood normal.     ED Results / Procedures / Treatments   Labs (all labs ordered are listed, but only abnormal results are displayed) Labs Reviewed  CBC WITH DIFFERENTIAL/PLATELET - Abnormal; Notable for the following components:      Result Value   MCV 100.7 (*)    Abs Immature Granulocytes 0.09 (*)    All other components within normal  limits  COMPREHENSIVE METABOLIC PANEL - Abnormal; Notable for the following components:   Sodium 132 (*)    Chloride 97 (*)    Glucose, Bld 101 (*)    Creatinine, Ser 1.15 (*)    GFR, Estimated 44 (*)    All other components within normal limits    EKG EKG Interpretation  Date/Time:  Sunday June 17 2023 13:34:44 EDT Ventricular Rate:  69 PR Interval:  178 QRS Duration: 137 QT Interval:  462 QTC Calculation: 495 R Axis:   96 Text Interpretation:  Sinus rhythm Probable left atrial enlargement RBBB and LPFB No significant change since last tracing Confirmed by Vanetta Mulders [id number]) on 06/17/2023 2:29:13 PM  Radiology CT Head Wo Contrast  Result Date: 06/17/2023 CLINICAL DATA:  Trauma EXAM: CT HEAD WITHOUT CONTRAST CT CERVICAL SPINE WITHOUT CONTRAST TECHNIQUE: Multidetector CT imaging of the head and cervical spine was performed following the standard protocol without intravenous contrast. Multiplanar CT image reconstructions of the cervical spine were also generated. RADIATION DOSE REDUCTION: This exam was performed according to the departmental dose-optimization program which includes automated exposure control, adjustment of the mA and/or kV according to patient size and/or use of iterative reconstruction technique. COMPARISON:  11/04/2021 FINDINGS: CT HEAD FINDINGS Brain: No evidence of acute infarction, hemorrhage, hydrocephalus, extra-axial collection or mass lesion/mass effect. Mild periventricular white matter hypodensity. Vascular: No hyperdense vessel or unexpected calcification. Skull: Normal. Negative for fracture or focal lesion. Sinuses/Orbits: No acute finding. Other: Soft tissue contusion of the left scalp vertex (series 5, image 58). CT CERVICAL SPINE FINDINGS Alignment: Normal. Skull base and vertebrae: No acute fracture. No primary bone lesion or focal pathologic process. Soft tissues and spinal canal: No prevertebral fluid or swelling. No visible canal hematoma. Disc levels:  Mild multilevel disc space height loss and osteophytosis throughout. Upper chest: Negative. Other: None. IMPRESSION: 1. No acute intracranial pathology. Small-vessel white matter disease. 2. Soft tissue contusion of the left scalp vertex. 3. No fracture or static subluxation of the cervical spine. Electronically Signed   By: Jearld Lesch M.D.   On: 06/17/2023 14:20   CT Cervical Spine Wo Contrast  Result Date: 06/17/2023 CLINICAL DATA:  Trauma EXAM: CT HEAD WITHOUT CONTRAST CT CERVICAL SPINE WITHOUT CONTRAST TECHNIQUE: Multidetector CT imaging of the head and cervical spine was performed following the standard protocol without intravenous contrast. Multiplanar CT image reconstructions of the cervical spine were also generated. RADIATION DOSE REDUCTION: This exam was performed according to the departmental dose-optimization program which includes automated exposure control, adjustment of the mA and/or kV according to patient size and/or use of iterative reconstruction technique. COMPARISON:  11/04/2021 FINDINGS: CT HEAD FINDINGS Brain: No evidence of acute infarction, hemorrhage, hydrocephalus, extra-axial collection or mass lesion/mass effect. Mild periventricular white matter hypodensity. Vascular: No hyperdense vessel or unexpected calcification. Skull: Normal. Negative for fracture or focal lesion. Sinuses/Orbits: No acute finding. Other: Soft tissue contusion of the left scalp vertex (series 5, image 58). CT CERVICAL SPINE FINDINGS Alignment: Normal. Skull base and vertebrae: No acute fracture. No primary bone lesion or focal pathologic process. Soft tissues and spinal canal: No prevertebral fluid or swelling. No visible canal hematoma. Disc levels: Mild multilevel disc space height loss and osteophytosis throughout. Upper chest: Negative. Other: None. IMPRESSION: 1. No acute intracranial pathology. Small-vessel white matter disease. 2. Soft tissue contusion of the left scalp vertex. 3. No fracture or  static subluxation of the cervical spine. Electronically Signed   By: Jearld Lesch M.D.   On: 06/17/2023 14:20    Procedures Procedures    Medications Ordered in ED Medications  bacitracin ointment (1 Application Topical Given 06/17/23 1525)    ED Course/ Medical Decision Making/ A&P                             Medical Decision Making Amount and/or Complexity of Data Reviewed Labs: ordered. Radiology: ordered.   Patient's workup here very reassuring head CT without any acute findings CT cervical spine negative for any injuries.  Collar removed.  Patient moving neck around fine.  Evaluated the a subtotal wound to the back of the head and does not really require sutures or stapling.  No active bleeding.  Will heal on its own.  Both elbows cleaned and dressed up with antibiotic ointment and dressing applied.  CBC white count 7.5 hemoglobin 13.8 platelets 198 complete metabolic panel sodium down a little bit at 132 GFR 44, baseline for her otherwise negative.  Patient stable for discharge home family is here to take her home.   Final Clinical Impression(s) / ED Diagnoses Final diagnoses:  Fall, initial encounter  Injury of head, initial encounter  Contusion of scalp, initial encounter  Multiple skin tears    Rx / DC Orders ED Discharge Orders     None         Vanetta Mulders, MD 06/17/23 1530

## 2023-06-17 NOTE — ED Triage Notes (Signed)
BIBA with [facility] for fall on thinners, was in kitchen and fall backwards, takes eliquis, with EMS pt started having left facial droop at 1305 and increasing BP 201/157, 1in lac to head, no hx of stroke.

## 2023-06-17 NOTE — Discharge Instructions (Signed)
As we discussed you can wash your hair.  May want to lay a towel on your pillow because it may be some oozing from the contusion to the back of your head.  Head CT CT cervical spine negative.  Labs without significant abnormalities.  Continue your current meds.  Skin tears to both elbow would wash them with soap and water daily apply antibiotic ointment and then a dressing.  Make an appointment to follow-up with your primary care doctor to be rechecked.  Return for any new or worse symptoms.

## 2023-06-22 ENCOUNTER — Telehealth: Payer: Self-pay

## 2023-06-22 NOTE — Telephone Encounter (Signed)
Transition Care Management Follow-up Telephone Call Date of discharge and from where: [facility] 6/23 How have you been since you were released from the hospital? Sore  Any questions or concerns? No  Items Reviewed: Did the pt receive and understand the discharge instructions provided? Yes  Medications obtained and verified? Yes  Other? No  Any new allergies since your discharge? No  Dietary orders reviewed? No Do you have support at home? Yes     Follow up appointments reviewed:  PCP Hospital f/u appt confirmed? No  Scheduled to see  on  @ . Specialist Hospital f/u appt confirmed? No  Scheduled to see  on @ . Are transportation arrangements needed? No  If their condition worsens, is the pt aware to call PCP or go to the Emergency Dept.? Yes Was the patient provided with contact information for the PCP's office or ED? Yes Was to pt encouraged to call back with questions or concerns? Yes

## 2023-07-03 ENCOUNTER — Other Ambulatory Visit (HOSPITAL_COMMUNITY): Payer: Self-pay | Admitting: Internal Medicine

## 2023-07-03 DIAGNOSIS — S0990XA Unspecified injury of head, initial encounter: Secondary | ICD-10-CM | POA: Diagnosis not present

## 2023-07-03 DIAGNOSIS — N183 Chronic kidney disease, stage 3 unspecified: Secondary | ICD-10-CM | POA: Diagnosis not present

## 2023-07-03 DIAGNOSIS — M545 Low back pain, unspecified: Secondary | ICD-10-CM

## 2023-07-03 DIAGNOSIS — I10 Essential (primary) hypertension: Secondary | ICD-10-CM | POA: Diagnosis not present

## 2023-07-03 DIAGNOSIS — R102 Pelvic and perineal pain: Secondary | ICD-10-CM

## 2023-07-03 DIAGNOSIS — Z681 Body mass index (BMI) 19 or less, adult: Secondary | ICD-10-CM | POA: Diagnosis not present

## 2023-07-03 DIAGNOSIS — M810 Age-related osteoporosis without current pathological fracture: Secondary | ICD-10-CM | POA: Diagnosis not present

## 2023-07-16 ENCOUNTER — Ambulatory Visit (HOSPITAL_COMMUNITY)
Admission: RE | Admit: 2023-07-16 | Discharge: 2023-07-16 | Disposition: A | Discharge status: Home / Self Care | Payer: PPO | Source: Ambulatory Visit | Attending: Internal Medicine | Admitting: Internal Medicine

## 2023-07-16 DIAGNOSIS — M160 Bilateral primary osteoarthritis of hip: Secondary | ICD-10-CM | POA: Diagnosis not present

## 2023-07-16 DIAGNOSIS — R102 Pelvic and perineal pain: Secondary | ICD-10-CM | POA: Insufficient documentation

## 2023-07-16 DIAGNOSIS — M47816 Spondylosis without myelopathy or radiculopathy, lumbar region: Secondary | ICD-10-CM | POA: Diagnosis not present

## 2023-07-16 DIAGNOSIS — M5126 Other intervertebral disc displacement, lumbar region: Secondary | ICD-10-CM | POA: Diagnosis not present

## 2023-07-16 DIAGNOSIS — M545 Low back pain, unspecified: Secondary | ICD-10-CM | POA: Diagnosis not present

## 2023-07-16 DIAGNOSIS — M4856XA Collapsed vertebra, not elsewhere classified, lumbar region, initial encounter for fracture: Secondary | ICD-10-CM | POA: Diagnosis not present

## 2023-07-16 DIAGNOSIS — M419 Scoliosis, unspecified: Secondary | ICD-10-CM | POA: Diagnosis not present

## 2023-07-24 DIAGNOSIS — H353211 Exudative age-related macular degeneration, right eye, with active choroidal neovascularization: Secondary | ICD-10-CM | POA: Diagnosis not present

## 2023-07-24 DIAGNOSIS — H43813 Vitreous degeneration, bilateral: Secondary | ICD-10-CM | POA: Diagnosis not present

## 2023-07-24 DIAGNOSIS — H353124 Nonexudative age-related macular degeneration, left eye, advanced atrophic with subfoveal involvement: Secondary | ICD-10-CM | POA: Diagnosis not present

## 2023-07-24 DIAGNOSIS — H26491 Other secondary cataract, right eye: Secondary | ICD-10-CM | POA: Diagnosis not present

## 2023-07-24 DIAGNOSIS — H3561 Retinal hemorrhage, right eye: Secondary | ICD-10-CM | POA: Diagnosis not present

## 2023-07-24 DIAGNOSIS — H353113 Nonexudative age-related macular degeneration, right eye, advanced atrophic without subfoveal involvement: Secondary | ICD-10-CM | POA: Diagnosis not present

## 2023-08-01 ENCOUNTER — Ambulatory Visit: Payer: PPO | Admitting: Cardiology

## 2023-08-07 DIAGNOSIS — S32030A Wedge compression fracture of third lumbar vertebra, initial encounter for closed fracture: Secondary | ICD-10-CM | POA: Diagnosis not present

## 2023-08-13 ENCOUNTER — Other Ambulatory Visit: Payer: Self-pay | Admitting: Cardiology

## 2023-09-04 DIAGNOSIS — H43813 Vitreous degeneration, bilateral: Secondary | ICD-10-CM | POA: Diagnosis not present

## 2023-09-04 DIAGNOSIS — H3561 Retinal hemorrhage, right eye: Secondary | ICD-10-CM | POA: Diagnosis not present

## 2023-09-04 DIAGNOSIS — H353124 Nonexudative age-related macular degeneration, left eye, advanced atrophic with subfoveal involvement: Secondary | ICD-10-CM | POA: Diagnosis not present

## 2023-09-04 DIAGNOSIS — H353211 Exudative age-related macular degeneration, right eye, with active choroidal neovascularization: Secondary | ICD-10-CM | POA: Diagnosis not present

## 2023-09-04 DIAGNOSIS — H26491 Other secondary cataract, right eye: Secondary | ICD-10-CM | POA: Diagnosis not present

## 2023-09-04 DIAGNOSIS — H353113 Nonexudative age-related macular degeneration, right eye, advanced atrophic without subfoveal involvement: Secondary | ICD-10-CM | POA: Diagnosis not present

## 2023-09-15 ENCOUNTER — Encounter (HOSPITAL_COMMUNITY): Payer: Self-pay | Admitting: Emergency Medicine

## 2023-09-15 ENCOUNTER — Emergency Department (HOSPITAL_COMMUNITY)
Admission: EM | Admit: 2023-09-15 | Discharge: 2023-09-16 | Disposition: A | Discharge status: Home / Self Care | Payer: PPO | Attending: Emergency Medicine | Admitting: Emergency Medicine

## 2023-09-15 ENCOUNTER — Emergency Department (HOSPITAL_COMMUNITY): Payer: PPO

## 2023-09-15 DIAGNOSIS — R059 Cough, unspecified: Secondary | ICD-10-CM | POA: Diagnosis not present

## 2023-09-15 DIAGNOSIS — W19XXXA Unspecified fall, initial encounter: Secondary | ICD-10-CM | POA: Diagnosis not present

## 2023-09-15 DIAGNOSIS — J9811 Atelectasis: Secondary | ICD-10-CM | POA: Diagnosis not present

## 2023-09-15 DIAGNOSIS — S5012XA Contusion of left forearm, initial encounter: Secondary | ICD-10-CM | POA: Insufficient documentation

## 2023-09-15 DIAGNOSIS — T07XXXA Unspecified multiple injuries, initial encounter: Secondary | ICD-10-CM

## 2023-09-15 DIAGNOSIS — Z043 Encounter for examination and observation following other accident: Secondary | ICD-10-CM | POA: Diagnosis not present

## 2023-09-15 DIAGNOSIS — S0003XA Contusion of scalp, initial encounter: Secondary | ICD-10-CM | POA: Diagnosis not present

## 2023-09-15 DIAGNOSIS — M25551 Pain in right hip: Secondary | ICD-10-CM | POA: Insufficient documentation

## 2023-09-15 DIAGNOSIS — M25552 Pain in left hip: Secondary | ICD-10-CM | POA: Insufficient documentation

## 2023-09-15 DIAGNOSIS — S59912A Unspecified injury of left forearm, initial encounter: Secondary | ICD-10-CM | POA: Diagnosis present

## 2023-09-15 DIAGNOSIS — I10 Essential (primary) hypertension: Secondary | ICD-10-CM | POA: Diagnosis not present

## 2023-09-15 DIAGNOSIS — S0990XA Unspecified injury of head, initial encounter: Secondary | ICD-10-CM | POA: Insufficient documentation

## 2023-09-15 DIAGNOSIS — Z7901 Long term (current) use of anticoagulants: Secondary | ICD-10-CM | POA: Insufficient documentation

## 2023-09-15 DIAGNOSIS — I517 Cardiomegaly: Secondary | ICD-10-CM | POA: Diagnosis not present

## 2023-09-15 DIAGNOSIS — J439 Emphysema, unspecified: Secondary | ICD-10-CM | POA: Diagnosis not present

## 2023-09-15 DIAGNOSIS — R609 Edema, unspecified: Secondary | ICD-10-CM | POA: Diagnosis not present

## 2023-09-15 LAB — BASIC METABOLIC PANEL
Anion gap: 13 (ref 5–15)
BUN: 31 mg/dL — ABNORMAL HIGH (ref 8–23)
CO2: 24 mmol/L (ref 22–32)
Calcium: 9.4 mg/dL (ref 8.9–10.3)
Chloride: 98 mmol/L (ref 98–111)
Creatinine, Ser: 1.52 mg/dL — ABNORMAL HIGH (ref 0.44–1.00)
GFR, Estimated: 31 mL/min — ABNORMAL LOW (ref >60)
Glucose, Bld: 122 mg/dL — ABNORMAL HIGH (ref 70–99)
Potassium: 5.1 mmol/L (ref 3.5–5.1)
Sodium: 135 mmol/L (ref 135–145)

## 2023-09-15 LAB — CBC
HCT: 42.6 % (ref 36.0–46.0)
Hemoglobin: 13.9 g/dL (ref 12.0–15.0)
MCH: 31.7 pg (ref 26.0–34.0)
MCHC: 32.6 g/dL (ref 30.0–36.0)
MCV: 97.3 fL (ref 80.0–100.0)
Platelets: 206 10*3/uL (ref 150–400)
RBC: 4.38 MIL/uL (ref 3.87–5.11)
RDW: 14.7 % (ref 11.5–15.5)
WBC: 12.7 10*3/uL — ABNORMAL HIGH (ref 4.0–10.5)
nRBC: 0 % (ref 0.0–0.2)

## 2023-09-15 NOTE — ED Provider Notes (Signed)
[place] EMERGENCY DEPARTMENT AT [facility] Provider Note   CSN: [id number] Arrival date & time: 09/15/23  2048     History  Chief Complaint  Patient presents with   Marletta Lor    Miranda Ford is a 89 y.o. female.   Fall     Patient presents ED for evaluation after a fall.  Patient states she lost her balance and ended up falling.  Patient states she has some trouble with her balance at baseline.  She does not feel weak or dizzy.  Patient states she struck her head.  She is on anticoagulation.  She has had a cough but that has been chronic.  No acute changes with that.  She has had some discomfort in her hip but that was before the fall. Home Medications Prior to Admission medications   Medication Sig Start Date End Date Taking? Authorizing Provider  acetaminophen (TYLENOL) 325 MG tablet Take 1-2 tablets (325-650 mg total) by mouth every 6 (six) hours as needed for mild pain (pain score 1-3 or temp > 100.5). 07/14/19   Montez Morita, PA-C  amiodarone (PACERONE) 200 MG tablet TAKE (1/2) TABLET BY MOUTH ONCE DAILY. 11/20/22   Jonelle Sidle, MD  apixaban (ELIQUIS) 2.5 MG TABS tablet TAKE 1 TABLET BY MOUTH AT 10AM AND 1 TABLET AT 9PM. 03/07/23   Jonelle Sidle, MD  atorvastatin (LIPITOR) 40 MG tablet TAKE (1) TABLET BY MOUTH AT BEDTIME. 08/13/23   Jonelle Sidle, MD  calcium carbonate (OSCAL) 1500 (600 Ca) MG TABS tablet Take 1,500 mg by mouth daily.     [provider]  carvedilol (COREG) 3.125 MG tablet TAKE 1 TABLET BY MOUTH TWICE DAILY WITH A MEAL. Patient taking differently: Take 3.125 mg by mouth 2 (two) times daily with a meal. 04/25/18   Jodelle Gross, NP  furosemide (LASIX) 20 MG tablet TAKE 1 TABLET BY MOUTH ONCE A DAY. 11/14/22   Strader, Lennart Pall, PA-C  Multiple Vitamins-Minerals (ICAPS PO) Take 1 capsule by mouth daily.    [provider]  spironolactone (ALDACTONE) 25 MG tablet Take 0.5 tablets (12.5 mg total) by mouth daily.  06/03/21 06/23/22  Ellsworth Lennox, PA-C      Allergies    Codeine and Penicillins    Review of Systems   Review of Systems  Physical Exam Updated Vital Signs BP 98/71   Pulse 73   Temp 98 F (36.7 C) (Oral)   Resp 16   SpO2 90%  Physical Exam Vitals and nursing note reviewed.  Constitutional:      Appearance: She is well-developed. She is not diaphoretic.  HENT:     Head: Normocephalic and atraumatic.     Right Ear: External ear normal.     Left Ear: External ear normal.  Eyes:     General: No scleral icterus.       Right eye: No discharge.        Left eye: No discharge.     Conjunctiva/sclera: Conjunctivae normal.  Neck:     Trachea: No tracheal deviation.  Cardiovascular:     Rate and Rhythm: Normal rate and regular rhythm.  Pulmonary:     Effort: Pulmonary effort is normal. No respiratory distress.     Breath sounds: Normal breath sounds. No stridor. No wheezing or rales.  Abdominal:     General: Bowel sounds are normal. There is no distension.     Palpations: Abdomen is soft.     Tenderness: There  is no abdominal tenderness. There is no guarding or rebound.  Musculoskeletal:        General: No tenderness or deformity.     Cervical back: Neck supple.     Comments: No Cervical thoracic or lumbar spine tenderness, bruising noted left arm  Skin:    General: Skin is warm and dry.     Findings: No rash.  Neurological:     General: No focal deficit present.     Mental Status: She is alert.     Cranial Nerves: No cranial nerve deficit, dysarthria or facial asymmetry.     Sensory: No sensory deficit.     Motor: No abnormal muscle tone or seizure activity.     Coordination: Coordination normal.  Psychiatric:        Mood and Affect: Mood normal.     ED Results / Procedures / Treatments   Labs (all labs ordered are listed, but only abnormal results are displayed) Labs Reviewed  CBC - Abnormal; Notable for the following components:      Result Value   WBC  12.7 (*)    All other components within normal limits  BASIC METABOLIC PANEL - Abnormal; Notable for the following components:   Glucose, Bld 122 (*)    BUN 31 (*)    Creatinine, Ser 1.52 (*)    GFR, Estimated 31 (*)    All other components within normal limits    EKG EKG Interpretation Date/Time:  Saturday September 15 2023 20:53:35 EDT Ventricular Rate:  83 PR Interval:  175 QRS Duration:  130 QT Interval:  432 QTC Calculation: 508 R Axis:   94  Text Interpretation: Sinus rhythm Atrial premature complex RBBB and LPFB No significant change since last tracing Confirmed by Linwood Dibbles [id number]) on 09/15/2023 9:15:13 PM  Radiology DG Chest Port 1 View  Result Date: 09/15/2023 CLINICAL DATA:  Fall EXAM: PORTABLE CHEST 1 VIEW COMPARISON:  Chest x-ray 11/04/2021 FINDINGS: The heart is enlarged, unchanged. Lungs are hyperinflated, unchanged. There is no focal lung infiltrate, pleural effusion or pneumothorax. There is minimal atelectasis in the right lung base. No acute fractures are seen. IMPRESSION: 1. No active disease. 2. Cardiomegaly. Electronically Signed   By: Darliss Cheney M.D.   On: 09/15/2023 23:38   CT Cervical Spine Wo Contrast  Result Date: 09/15/2023 CLINICAL DATA:  Fall, on blood thinners EXAM: CT HEAD WITHOUT CONTRAST CT CERVICAL SPINE WITHOUT CONTRAST TECHNIQUE: Multidetector CT imaging of the head and cervical spine was performed following the standard protocol without intravenous contrast. Multiplanar CT image reconstructions of the cervical spine were also generated. RADIATION DOSE REDUCTION: This exam was performed according to the departmental dose-optimization program which includes automated exposure control, adjustment of the mA and/or kV according to patient size and/or use of iterative reconstruction technique. COMPARISON:  06/17/2023 FINDINGS: CT HEAD FINDINGS Brain: No evidence of acute infarct, hemorrhage, mass, mass effect, or midline shift. No hydrocephalus or  extra-axial fluid collection. Periventricular white matter changes, likely the sequela of chronic small vessel ischemic disease. Vascular: No hyperdense vessel. Skull: Negative for fracture or focal lesion. Right greater than left parietooccipital scalp hematoma. Sinuses/Orbits: Mild mucosal thickening in the left maxillary sinus in the ethmoid air cells. Postsurgical changes in the bilateral globes. Other: The mastoid air cells are well aerated. CT CERVICAL SPINE FINDINGS Alignment: No traumatic listhesis. Mild exaggeration of the normal cervical lordosis. Skull base and vertebrae: No acute fracture or suspicious osseous lesion. Soft tissues and spinal canal: No prevertebral fluid  or swelling. No visible canal hematoma. Disc levels: Degenerative changes in the cervical spine. No significant spinal canal stenosis. Upper chest: No focal pulmonary opacity or pleural effusion. Apical pleuroparenchymal scarring. Emphysema. IMPRESSION: 1. No acute intracranial process. 2. No acute fracture or traumatic listhesis in the cervical spine. Electronically Signed   By: Wiliam Ke M.D.   On: 09/15/2023 22:50   CT Head Wo Contrast  Result Date: 09/15/2023 CLINICAL DATA:  Fall, on blood thinners EXAM: CT HEAD WITHOUT CONTRAST CT CERVICAL SPINE WITHOUT CONTRAST TECHNIQUE: Multidetector CT imaging of the head and cervical spine was performed following the standard protocol without intravenous contrast. Multiplanar CT image reconstructions of the cervical spine were also generated. RADIATION DOSE REDUCTION: This exam was performed according to the departmental dose-optimization program which includes automated exposure control, adjustment of the mA and/or kV according to patient size and/or use of iterative reconstruction technique. COMPARISON:  06/17/2023 FINDINGS: CT HEAD FINDINGS Brain: No evidence of acute infarct, hemorrhage, mass, mass effect, or midline shift. No hydrocephalus or extra-axial fluid collection.  Periventricular white matter changes, likely the sequela of chronic small vessel ischemic disease. Vascular: No hyperdense vessel. Skull: Negative for fracture or focal lesion. Right greater than left parietooccipital scalp hematoma. Sinuses/Orbits: Mild mucosal thickening in the left maxillary sinus in the ethmoid air cells. Postsurgical changes in the bilateral globes. Other: The mastoid air cells are well aerated. CT CERVICAL SPINE FINDINGS Alignment: No traumatic listhesis. Mild exaggeration of the normal cervical lordosis. Skull base and vertebrae: No acute fracture or suspicious osseous lesion. Soft tissues and spinal canal: No prevertebral fluid or swelling. No visible canal hematoma. Disc levels: Degenerative changes in the cervical spine. No significant spinal canal stenosis. Upper chest: No focal pulmonary opacity or pleural effusion. Apical pleuroparenchymal scarring. Emphysema. IMPRESSION: 1. No acute intracranial process. 2. No acute fracture or traumatic listhesis in the cervical spine. Electronically Signed   By: Wiliam Ke M.D.   On: 09/15/2023 22:50    Procedures Procedures    Medications Ordered in ED Medications - No data to display  ED Course/ Medical Decision Making/ A&P Clinical Course as of 09/15/23 2345  Sat Sep 15, 2023  2313 Head CT and C-spine CT without acute process [JK]  2313 CBC(!) CBC shows leukocytosis but no anemia [JK]    Clinical Course User Index [JK] Linwood Dibbles, MD                                 Medical Decision Making Amount and/or Complexity of Data Reviewed Labs: ordered. Decision-making details documented in ED Course. Radiology: ordered.   Patient presented to ED for evaluation after fall.  Patient is on anticoagulation.  She was activated as a trauma.  Fortunately no signs of serious injury on head CT and C-spine CT.  Patient did mention a cough recently.  Chest x-ray was ordered.  Plain films are currently pending.  Care turned over to Dr  Oletta Cohn at shift change.        Final Clinical Impression(s) / ED Diagnoses Final diagnoses:  None    Rx / DC Orders ED Discharge Orders     None         Linwood Dibbles, MD 09/15/23 2345

## 2023-09-15 NOTE — ED Notes (Signed)
Pt placed on 3 liters N/c

## 2023-09-15 NOTE — ED Triage Notes (Signed)
Pt here from home with c/o fall about 1 hour ago after loss of balance , pt his her head , no loc is on blood thinners

## 2023-09-15 NOTE — Progress Notes (Signed)
   09/15/23 2100  Spiritual Encounters  Type of Visit Initial  Care provided to: Pt and family  Conversation partners present during encounter Nurse  Reason for visit Routine spiritual support  OnCall Visit Yes   Responded to level 2 trauma, fall on thinner. Spoke with patient. Located Niece Miranda Ford and Care taker Miranda Ford in lobby and escorted to room.

## 2023-09-16 NOTE — ED Notes (Signed)
Pt in NAD at d/c from ED. A&O. Ambulatory. Respirations even & unlabored. Skin warm & dry Pt verbalized understanding including follow up care and reasons to return to the ED. No needs or questions expressed at d/c.

## 2023-09-16 NOTE — ED Provider Notes (Signed)
Patient was signed out to me by Dr. Lynelle Doctor.  X-rays pending.  Patient with poor balance, normally uses a rollator.  She had a fall at home today.  Patient with multiple contusions.  X-rays have been reviewed, no fractures.  CT head and cervical spine negative.  Upon recheck, patient resting comfortably, reports no current concerns.  Will discharge.   Gilda Crease, MD 09/16/23 [place]ers

## 2023-09-26 ENCOUNTER — Other Ambulatory Visit: Payer: Self-pay | Admitting: Cardiology

## 2023-09-26 NOTE — Telephone Encounter (Signed)
Prescription refill request for Eliquis received. Indication: PE Last office visit: 01/24/23  Ival Bible MD Scr: 1.52 on 09/15/23  Epic Age: 89 Weight: 52.2kg  Based on above findings Eliquis 2.5mg  twice daily is the appropriate dose.  Refill approved.

## 2023-10-16 DIAGNOSIS — H3561 Retinal hemorrhage, right eye: Secondary | ICD-10-CM | POA: Diagnosis not present

## 2023-10-16 DIAGNOSIS — H353211 Exudative age-related macular degeneration, right eye, with active choroidal neovascularization: Secondary | ICD-10-CM | POA: Diagnosis not present

## 2023-10-16 DIAGNOSIS — H26491 Other secondary cataract, right eye: Secondary | ICD-10-CM | POA: Diagnosis not present

## 2023-10-16 DIAGNOSIS — H353113 Nonexudative age-related macular degeneration, right eye, advanced atrophic without subfoveal involvement: Secondary | ICD-10-CM | POA: Diagnosis not present

## 2023-10-16 DIAGNOSIS — H43813 Vitreous degeneration, bilateral: Secondary | ICD-10-CM | POA: Diagnosis not present

## 2023-10-16 DIAGNOSIS — H353124 Nonexudative age-related macular degeneration, left eye, advanced atrophic with subfoveal involvement: Secondary | ICD-10-CM | POA: Diagnosis not present

## 2023-11-08 DIAGNOSIS — I10 Essential (primary) hypertension: Secondary | ICD-10-CM | POA: Diagnosis not present

## 2023-11-08 DIAGNOSIS — N183 Chronic kidney disease, stage 3 unspecified: Secondary | ICD-10-CM | POA: Diagnosis not present

## 2023-11-08 DIAGNOSIS — E782 Mixed hyperlipidemia: Secondary | ICD-10-CM | POA: Diagnosis not present

## 2023-11-08 DIAGNOSIS — I517 Cardiomegaly: Secondary | ICD-10-CM | POA: Diagnosis not present

## 2023-11-08 DIAGNOSIS — I509 Heart failure, unspecified: Secondary | ICD-10-CM | POA: Diagnosis not present

## 2023-11-08 DIAGNOSIS — R7309 Other abnormal glucose: Secondary | ICD-10-CM | POA: Diagnosis not present

## 2023-11-08 DIAGNOSIS — Z681 Body mass index (BMI) 19 or less, adult: Secondary | ICD-10-CM | POA: Diagnosis not present

## 2023-11-08 DIAGNOSIS — E7849 Other hyperlipidemia: Secondary | ICD-10-CM | POA: Diagnosis not present

## 2023-11-08 DIAGNOSIS — E559 Vitamin D deficiency, unspecified: Secondary | ICD-10-CM | POA: Diagnosis not present

## 2023-11-08 DIAGNOSIS — M810 Age-related osteoporosis without current pathological fracture: Secondary | ICD-10-CM | POA: Diagnosis not present

## 2023-11-15 ENCOUNTER — Encounter: Payer: Self-pay | Admitting: Cardiology

## 2023-11-15 ENCOUNTER — Ambulatory Visit: Payer: PPO | Attending: Cardiology | Admitting: Cardiology

## 2023-11-15 VITALS — BP 138/80 | HR 57 | Ht 63.0 in | Wt 100.6 lb

## 2023-11-15 DIAGNOSIS — Z86711 Personal history of pulmonary embolism: Secondary | ICD-10-CM

## 2023-11-15 DIAGNOSIS — I5020 Unspecified systolic (congestive) heart failure: Secondary | ICD-10-CM

## 2023-11-15 DIAGNOSIS — I4719 Other supraventricular tachycardia: Secondary | ICD-10-CM

## 2023-11-15 DIAGNOSIS — I350 Nonrheumatic aortic (valve) stenosis: Secondary | ICD-10-CM | POA: Diagnosis not present

## 2023-11-15 DIAGNOSIS — I5022 Chronic systolic (congestive) heart failure: Secondary | ICD-10-CM

## 2023-11-15 NOTE — Patient Instructions (Signed)
Medication Instructions:  Your physician recommends that you continue on your current medications as directed. Please refer to the Current Medication list given to you today.   Labwork: None today  Testing/Procedures: None today  Follow-Up: 6 months  Any Other Special Instructions Will Be Listed Below (If Applicable).  If you need a refill on your cardiac medications before your next appointment, please call your pharmacy.  

## 2023-11-15 NOTE — Progress Notes (Signed)
    Cardiology Office Note  Date: 11/15/2023   ID: ARIEAL LEMIN, DOB 1924/12/05, MRN [id number]  History of Present Illness: Miranda Ford is a 89 y.o. female last seen in January.  She is here today with family member for a follow-up visit.  She has had increasing falls due to lack of balance.  She is in a wheelchair today.  Apparently has hit her head a few times, but fortunately no major injuries.  She does not report any chest pain or palpitations.  I reviewed her medications.  Current cardiac regimen includes amiodarone, Coreg, Eliquis, Lipitor, Aldactone, and Lasix.  I reviewed her recent lab work which is noted below.  I reviewed her lab work done recently by Dr. Phillips Odor and outlined below.  Today we discussed the possibility of stopping Eliquis given her falls and increased bleeding risk.  She has been on anticoagulation long-term after pulmonary embolus back in 2017.  Physical Exam: VS:  BP 138/80   Pulse (!) 57   Ht 5\' 3"  (1.6 m)   Wt 100 lb 9.6 oz (45.6 kg)   SpO2 94%   BMI 17.82 kg/m , BMI Body mass index is 17.82 kg/m.  Wt Readings from Last 3 Encounters:  11/15/23 100 lb 9.6 oz (45.6 kg)  06/17/23 115 lb 1.3 oz (52.2 kg)  01/24/23 115 lb (52.2 kg)    General: Patient appears comfortable at rest. HEENT: Conjunctiva and lids normal. Neck: Supple, no elevated JVP or carotid bruits. Lungs: Clear to auscultation, nonlabored breathing at rest. Cardiac: Regular rate and rhythm, no S3 or significant systolic murmur. Extremities: No pitting edema.  ECG:  An ECG dated 09/15/2023 was personally reviewed today and demonstrated:  Sinus rhythm with right bundle branch block and left posterior fascicular block.  Labwork: 06/17/2023: ALT 20; AST 28 09/15/2023: BUN 31; Creatinine, Ser 1.52; Hemoglobin 13.9; Platelets 206; Potassium 5.1; Sodium 135  November 2024: Hemoglobin 14.3, platelets 217, BUN 23, creatinine 1.18, potassium 4.9, AST 23, ALT 14, cholesterol 147,  triglycerides 79, HDL 74, LDL 58, hemoglobin A1c 6.1%, TSH 3.19  Other Studies Reviewed Today:  No interval cardiac testing for review today.  Assessment and Plan:  1.  Paroxysmal atrial tachycardia.  No palpitations.  I reviewed her most recent ECG from September.  She remains on amiodarone for rhythm suppression.  Recent TSH and LFTs normal.   2.  Chronic anticoagulation with history of previous pulmonary embolus in 2017.  She remains on Eliquis.  We did discuss stopping anticoagulation at this point given concerns about increasing falls and bleeding risk.  She was very hesitant to make a change today, I encouraged her to discuss this with Dr. Phillips Odor as well.   3.  HFrEF with plan for conservative management per discussion with patient over time.  She remains symptomatically stable no fluid retention.  Currently on Coreg, Lasix, and Aldactone.   4.  Aortic stenosis, also conservatively managed, mild to moderate as of 2017.  She has also declined follow-up imaging studies.  Disposition:  Follow up  6 months.  Signed, Jonelle Sidle, M.D., F.A.C.C. [facility]

## 2023-11-18 ENCOUNTER — Inpatient Hospital Stay (HOSPITAL_COMMUNITY)
Admission: EM | Admit: 2023-11-18 | Discharge: 2023-11-21 | DRG: 884 | Disposition: A | Discharge status: Hospice | Payer: PPO | Attending: Internal Medicine | Admitting: Internal Medicine

## 2023-11-18 ENCOUNTER — Emergency Department (HOSPITAL_COMMUNITY): Payer: PPO

## 2023-11-18 ENCOUNTER — Other Ambulatory Visit: Payer: Self-pay

## 2023-11-18 ENCOUNTER — Encounter (HOSPITAL_COMMUNITY): Payer: Self-pay | Admitting: Emergency Medicine

## 2023-11-18 DIAGNOSIS — H3530 Unspecified macular degeneration: Secondary | ICD-10-CM | POA: Diagnosis present

## 2023-11-18 DIAGNOSIS — M4856XA Collapsed vertebra, not elsewhere classified, lumbar region, initial encounter for fracture: Secondary | ICD-10-CM | POA: Diagnosis present

## 2023-11-18 DIAGNOSIS — R296 Repeated falls: Secondary | ICD-10-CM | POA: Diagnosis present

## 2023-11-18 DIAGNOSIS — I480 Paroxysmal atrial fibrillation: Secondary | ICD-10-CM | POA: Diagnosis present

## 2023-11-18 DIAGNOSIS — R1032 Left lower quadrant pain: Secondary | ICD-10-CM | POA: Diagnosis not present

## 2023-11-18 DIAGNOSIS — K5641 Fecal impaction: Secondary | ICD-10-CM | POA: Diagnosis not present

## 2023-11-18 DIAGNOSIS — R0902 Hypoxemia: Secondary | ICD-10-CM | POA: Diagnosis not present

## 2023-11-18 DIAGNOSIS — Z681 Body mass index (BMI) 19 or less, adult: Secondary | ICD-10-CM

## 2023-11-18 DIAGNOSIS — I7121 Aneurysm of the ascending aorta, without rupture: Secondary | ICD-10-CM | POA: Diagnosis not present

## 2023-11-18 DIAGNOSIS — N1832 Chronic kidney disease, stage 3b: Secondary | ICD-10-CM | POA: Diagnosis present

## 2023-11-18 DIAGNOSIS — I5022 Chronic systolic (congestive) heart failure: Secondary | ICD-10-CM | POA: Diagnosis not present

## 2023-11-18 DIAGNOSIS — F0394 Unspecified dementia, unspecified severity, with anxiety: Secondary | ICD-10-CM | POA: Diagnosis present

## 2023-11-18 DIAGNOSIS — F0390 Unspecified dementia without behavioral disturbance: Secondary | ICD-10-CM | POA: Diagnosis not present

## 2023-11-18 DIAGNOSIS — F03911 Unspecified dementia, unspecified severity, with agitation: Secondary | ICD-10-CM | POA: Diagnosis not present

## 2023-11-18 DIAGNOSIS — Z808 Family history of malignant neoplasm of other organs or systems: Secondary | ICD-10-CM

## 2023-11-18 DIAGNOSIS — R4182 Altered mental status, unspecified: Principal | ICD-10-CM

## 2023-11-18 DIAGNOSIS — M810 Age-related osteoporosis without current pathological fracture: Secondary | ICD-10-CM | POA: Diagnosis present

## 2023-11-18 DIAGNOSIS — E43 Unspecified severe protein-calorie malnutrition: Secondary | ICD-10-CM | POA: Diagnosis not present

## 2023-11-18 DIAGNOSIS — M4854XA Collapsed vertebra, not elsewhere classified, thoracic region, initial encounter for fracture: Secondary | ICD-10-CM | POA: Diagnosis present

## 2023-11-18 DIAGNOSIS — H9190 Unspecified hearing loss, unspecified ear: Secondary | ICD-10-CM | POA: Diagnosis present

## 2023-11-18 DIAGNOSIS — S32000A Wedge compression fracture of unspecified lumbar vertebra, initial encounter for closed fracture: Secondary | ICD-10-CM

## 2023-11-18 DIAGNOSIS — Z7983 Long term (current) use of bisphosphonates: Secondary | ICD-10-CM

## 2023-11-18 DIAGNOSIS — Z87891 Personal history of nicotine dependence: Secondary | ICD-10-CM

## 2023-11-18 DIAGNOSIS — I4719 Other supraventricular tachycardia: Secondary | ICD-10-CM | POA: Diagnosis present

## 2023-11-18 DIAGNOSIS — R0600 Dyspnea, unspecified: Secondary | ICD-10-CM | POA: Diagnosis not present

## 2023-11-18 DIAGNOSIS — Z8249 Family history of ischemic heart disease and other diseases of the circulatory system: Secondary | ICD-10-CM

## 2023-11-18 DIAGNOSIS — I10 Essential (primary) hypertension: Secondary | ICD-10-CM | POA: Diagnosis not present

## 2023-11-18 DIAGNOSIS — R410 Disorientation, unspecified: Secondary | ICD-10-CM | POA: Diagnosis not present

## 2023-11-18 DIAGNOSIS — Z66 Do not resuscitate: Secondary | ICD-10-CM | POA: Diagnosis present

## 2023-11-18 DIAGNOSIS — Z803 Family history of malignant neoplasm of breast: Secondary | ICD-10-CM

## 2023-11-18 DIAGNOSIS — I5082 Biventricular heart failure: Secondary | ICD-10-CM | POA: Diagnosis present

## 2023-11-18 DIAGNOSIS — S22080A Wedge compression fracture of T11-T12 vertebra, initial encounter for closed fracture: Secondary | ICD-10-CM | POA: Diagnosis not present

## 2023-11-18 DIAGNOSIS — Z823 Family history of stroke: Secondary | ICD-10-CM

## 2023-11-18 DIAGNOSIS — Z885 Allergy status to narcotic agent status: Secondary | ICD-10-CM

## 2023-11-18 DIAGNOSIS — S22089A Unspecified fracture of T11-T12 vertebra, initial encounter for closed fracture: Secondary | ICD-10-CM | POA: Diagnosis present

## 2023-11-18 DIAGNOSIS — I6782 Cerebral ischemia: Secondary | ICD-10-CM | POA: Diagnosis not present

## 2023-11-18 DIAGNOSIS — R627 Adult failure to thrive: Secondary | ICD-10-CM | POA: Diagnosis present

## 2023-11-18 DIAGNOSIS — J439 Emphysema, unspecified: Secondary | ICD-10-CM | POA: Diagnosis present

## 2023-11-18 DIAGNOSIS — R64 Cachexia: Secondary | ICD-10-CM | POA: Diagnosis present

## 2023-11-18 DIAGNOSIS — Z833 Family history of diabetes mellitus: Secondary | ICD-10-CM

## 2023-11-18 DIAGNOSIS — J40 Bronchitis, not specified as acute or chronic: Secondary | ICD-10-CM | POA: Diagnosis not present

## 2023-11-18 DIAGNOSIS — Z79899 Other long term (current) drug therapy: Secondary | ICD-10-CM

## 2023-11-18 DIAGNOSIS — Z880 Allergy status to penicillin: Secondary | ICD-10-CM

## 2023-11-18 DIAGNOSIS — I517 Cardiomegaly: Secondary | ICD-10-CM | POA: Diagnosis not present

## 2023-11-18 DIAGNOSIS — Z7901 Long term (current) use of anticoagulants: Secondary | ICD-10-CM

## 2023-11-18 DIAGNOSIS — Z515 Encounter for palliative care: Secondary | ICD-10-CM

## 2023-11-18 DIAGNOSIS — Z86711 Personal history of pulmonary embolism: Secondary | ICD-10-CM

## 2023-11-18 LAB — CBC WITH DIFFERENTIAL/PLATELET
Abs Immature Granulocytes: 0.01 10*3/uL (ref 0.00–0.07)
Basophils Absolute: 0 10*3/uL (ref 0.0–0.1)
Basophils Relative: 0 %
Eosinophils Absolute: 0 10*3/uL (ref 0.0–0.5)
Eosinophils Relative: 1 %
HCT: 46.5 % — ABNORMAL HIGH (ref 36.0–46.0)
Hemoglobin: 15.2 g/dL — ABNORMAL HIGH (ref 12.0–15.0)
Immature Granulocytes: 0 %
Lymphocytes Relative: 9 %
Lymphs Abs: 0.6 10*3/uL — ABNORMAL LOW (ref 0.7–4.0)
MCH: 31.6 pg (ref 26.0–34.0)
MCHC: 32.7 g/dL (ref 30.0–36.0)
MCV: 96.7 fL (ref 80.0–100.0)
Monocytes Absolute: 0.6 10*3/uL (ref 0.1–1.0)
Monocytes Relative: 9 %
Neutro Abs: 5.8 10*3/uL (ref 1.7–7.7)
Neutrophils Relative %: 81 %
Platelets: 263 10*3/uL (ref 150–400)
RBC: 4.81 MIL/uL (ref 3.87–5.11)
RDW: 15.6 % — ABNORMAL HIGH (ref 11.5–15.5)
WBC: 7.2 10*3/uL (ref 4.0–10.5)
nRBC: 0 % (ref 0.0–0.2)

## 2023-11-18 LAB — COMPREHENSIVE METABOLIC PANEL
ALT: 19 U/L (ref 0–44)
AST: 32 U/L (ref 15–41)
Albumin: 3.4 g/dL — ABNORMAL LOW (ref 3.5–5.0)
Alkaline Phosphatase: 91 U/L (ref 38–126)
Anion gap: 10 (ref 5–15)
BUN: 35 mg/dL — ABNORMAL HIGH (ref 8–23)
CO2: 29 mmol/L (ref 22–32)
Calcium: 8.8 mg/dL — ABNORMAL LOW (ref 8.9–10.3)
Chloride: 94 mmol/L — ABNORMAL LOW (ref 98–111)
Creatinine, Ser: 1.32 mg/dL — ABNORMAL HIGH (ref 0.44–1.00)
GFR, Estimated: 37 mL/min — ABNORMAL LOW (ref >60)
Glucose, Bld: 107 mg/dL — ABNORMAL HIGH (ref 70–99)
Potassium: 5.1 mmol/L (ref 3.5–5.1)
Sodium: 133 mmol/L — ABNORMAL LOW (ref 135–145)
Total Bilirubin: 1.7 mg/dL — ABNORMAL HIGH (ref <1.2)
Total Protein: 7 g/dL (ref 6.5–8.1)

## 2023-11-18 LAB — URINALYSIS, ROUTINE W REFLEX MICROSCOPIC
Bilirubin Urine: NEGATIVE
Glucose, UA: NEGATIVE mg/dL
Hgb urine dipstick: NEGATIVE
Ketones, ur: 5 mg/dL — AB
Leukocytes,Ua: NEGATIVE
Nitrite: NEGATIVE
Protein, ur: NEGATIVE mg/dL
Specific Gravity, Urine: 1.04 — ABNORMAL HIGH (ref 1.005–1.030)
pH: 5 (ref 5.0–8.0)

## 2023-11-18 LAB — BRAIN NATRIURETIC PEPTIDE: B Natriuretic Peptide: 206 pg/mL — ABNORMAL HIGH (ref 0.0–100.0)

## 2023-11-18 MED ORDER — BISACODYL 5 MG PO TBEC: 5.0000 mg | DELAYED_RELEASE_TABLET | Freq: Every day | ORAL | Status: DC | PRN

## 2023-11-18 MED ORDER — PROSIGHT PO TABS
1.0000 | ORAL_TABLET | Freq: Every day | ORAL | Status: DC
Start: 2023-11-19 — End: 2023-11-19
  Filled 2023-11-18 (×3): qty 1

## 2023-11-18 MED ORDER — POLYETHYLENE GLYCOL 3350 17 G PO PACK: 17.0000 g | PACK | Freq: Every day | ORAL | Status: DC | PRN

## 2023-11-18 MED ORDER — ICAPS PO CAPS: ORAL_CAPSULE | Freq: Every day | ORAL | Status: DC

## 2023-11-18 MED ORDER — FUROSEMIDE 20 MG PO TABS
20.0000 mg | ORAL_TABLET | Freq: Every day | ORAL | Status: DC
  Administered 2023-11-19 – 2023-11-20 (×2): 20 mg via ORAL
  Filled 2023-11-18 (×3): qty 1

## 2023-11-18 MED ORDER — IOHEXOL 300 MG/ML  SOLN
75.0000 mL | Freq: Once | INTRAMUSCULAR | Status: AC | PRN
  Administered 2023-11-18: 75 mL via INTRAVENOUS

## 2023-11-18 MED ORDER — KETOROLAC TROMETHAMINE 15 MG/ML IJ SOLN
15.0000 mg | Freq: Four times a day (QID) | INTRAMUSCULAR | Status: AC | PRN
  Administered 2023-11-19 (×2): 15 mg via INTRAVENOUS
  Filled 2023-11-18 (×2): qty 1

## 2023-11-18 MED ORDER — DOCUSATE SODIUM 100 MG PO CAPS
100.0000 mg | ORAL_CAPSULE | Freq: Two times a day (BID) | ORAL | Status: DC
  Administered 2023-11-19 – 2023-11-20 (×3): 100 mg via ORAL
  Filled 2023-11-18 (×5): qty 1

## 2023-11-18 MED ORDER — CARVEDILOL 3.125 MG PO TABS
3.1250 mg | ORAL_TABLET | Freq: Two times a day (BID) | ORAL | Status: DC
  Administered 2023-11-19 (×2): 3.125 mg via ORAL
  Filled 2023-11-18 (×4): qty 1

## 2023-11-18 MED ORDER — ACETAMINOPHEN 325 MG PO TABS: 325.0000 mg | ORAL_TABLET | Freq: Four times a day (QID) | ORAL | Status: DC | PRN

## 2023-11-18 MED ORDER — AMIODARONE HCL 200 MG PO TABS
100.0000 mg | ORAL_TABLET | Freq: Every day | ORAL | Status: DC
  Administered 2023-11-19 – 2023-11-21 (×3): 100 mg via ORAL
  Filled 2023-11-18 (×3): qty 1

## 2023-11-18 MED ORDER — ONDANSETRON HCL 4 MG/2ML IJ SOLN: 4.0000 mg | Freq: Once | INTRAMUSCULAR | Status: DC

## 2023-11-18 MED ORDER — CALCIUM CARBONATE 1250 (500 CA) MG PO TABS
1250.0000 mg | ORAL_TABLET | Freq: Every day | ORAL | Status: DC
  Administered 2023-11-20: 1250 mg via ORAL
  Filled 2023-11-18 (×5): qty 1

## 2023-11-18 MED ORDER — APIXABAN 2.5 MG PO TABS: 2.5000 mg | ORAL_TABLET | Freq: Two times a day (BID) | ORAL | Status: DC

## 2023-11-18 NOTE — ED Triage Notes (Addendum)
Pt BIB [facility] from home, pt lives alone and has called family members x7, they are concerned for worsening AMS, hx of dementia, family reports to EMS they would like "medication to calm her down", pt reports she wants Hospice care

## 2023-11-18 NOTE — ED Notes (Signed)
[facility] called for stat brace placement

## 2023-11-18 NOTE — H&P (Signed)
History and Physical    Patient: Miranda Ford RJJ:[id number] DOB: Nov 13, 1924 DOA: 11/18/2023 DOS: the patient was seen and examined on 11/18/2023 PCP: Assunta Found, MD  Patient coming from: Home  Chief Complaint:  Chief Complaint  Patient presents with   Altered Mental Status   HPI: Miranda Ford is a 89 y.o. female with medical history significant of chronic systolic congestive heart failure, dementia, paroxysmal atrial tachycardia and amiodarone therapy, history of pulmonary embolism on Eliquis therapy, presented to the emergency department for evaluation of confusion, frequent falls.  Patient is a poor historian and all she talks about his services after death, fixated that she has cancer.  Patient's niece at bedside helped with history taking.  As per niece she has been more confused recently, had at least 4 falls since 1 year due to balance issues.  She was recently told to stop taking Eliquis given frequent falls and nosebleeds.  She lives alone and takes care of herself.  She had decline in her mental status and more agitated so was brought in by EMS for further management evaluation.  In the emergency department, patient has bradycardia with heart rate 53, blood pressure 151/68, noted to have saturation of 90% placed on 2 L supplemental oxygen.  Laboratory findings sodium 133, BUN 35, creatinine 1.32, BNP 206, CT abdomen pelvis, CT head unremarkable. CT lumbar spine showed acute appearing compression fracture T12, compression fractures noted at L2-L3.  Chronic compression fractures L1-L4.  ED provider requested hospitalist admission for further management evaluation of hypoxia, pain control and palliative consultation.  Review of Systems: unable to review all systems due to the inability of the patient to answer questions. Past Medical History:  Diagnosis Date   Cataracts, bilateral    Chronic systolic CHF (congestive heart failure) (HCC)    a. dx 06/2016 - conservative rx  recommended. EF 20% with biventricular failure in setting of PE.   Closed fracture of left proximal humerus 07/11/2019   Demand ischemia (HCC)    Epistaxis    Macular degeneration    Dry OD; wet OS   Osteoporosis    07/18/16 previously on Premarin; S/P > 10 years Alendronate   Paroxysmal atrial tachycardia (HCC)    a. placed on amiodarone 06/2016.   Pulmonary emboli (HCC) 06/2016   Past Surgical History:  Procedure Laterality Date   CATARACT EXTRACTION     bilaterally   CHOLECYSTECTOMY     INTRAMEDULLARY (IM) NAIL INTERTROCHANTERIC Left 07/11/2019   Procedure: INTRAMEDULLARY (IM) NAIL INTERTROCHANTRIC;  Surgeon: Myrene Galas, MD;  Location: [facility] OR;  Service: Orthopedics;  Laterality: Left;   TONSILLECTOMY     Social History:  reports that she quit smoking about 49 years ago. Her smoking use included cigarettes. She has never used smokeless tobacco. She reports that she does not drink alcohol and does not use drugs.  Allergies  Allergen Reactions   Codeine Nausea Only   Penicillins Other (See Comments)    "Not allergic just a lot of my family members are allergic". Has patient had a PCN reaction causing immediate rash, facial/tongue/throat swelling, SOB or lightheadedness with hypotension: No Has patient had a PCN reaction causing severe rash involving mucus membranes or skin necrosis: No Has patient had a PCN reaction that required hospitalization No Has patient had a PCN reaction occurring within the last 10 years: No If all of the above answers are "NO", then may proceed with Cephalosporin use.     Family History  Problem Relation Age of  Onset   Heart disease Mother    Stroke Mother    Diabetes Father    Cancer Sister        1 sister with metastatic bone cancer; 1 with breast cancer    Prior to Admission medications   Medication Sig Start Date End Date Taking? Authorizing Provider  acetaminophen (TYLENOL) 325 MG tablet Take 1-2 tablets (325-650 mg total) by mouth every 6  (six) hours as needed for mild pain (pain score 1-3 or temp > 100.5). 07/14/19   Montez Morita, PA-C  alendronate (FOSAMAX) 70 MG tablet Take 70 mg by mouth once a week. 07/30/23   [provider]  amiodarone (PACERONE) 200 MG tablet TAKE (1/2) TABLET BY MOUTH ONCE DAILY. 11/20/22   Jonelle Sidle, MD  atorvastatin (LIPITOR) 40 MG tablet TAKE (1) TABLET BY MOUTH AT BEDTIME. 08/13/23   Jonelle Sidle, MD  calcium carbonate (OSCAL) 1500 (600 Ca) MG TABS tablet Take 1,500 mg by mouth daily.     [provider]  carvedilol (COREG) 3.125 MG tablet TAKE 1 TABLET BY MOUTH TWICE DAILY WITH A MEAL. Patient taking differently: Take 3.125 mg by mouth 2 (two) times daily with a meal. 04/25/18   Jodelle Gross, NP  ELIQUIS 2.5 MG TABS tablet TAKE 1 TABLET BY MOUTH AT 10AM AND 1 TABLET AT 9PM. 09/26/23   Jonelle Sidle, MD  furosemide (LASIX) 20 MG tablet TAKE 1 TABLET BY MOUTH ONCE A DAY. 11/14/22   Strader, Lennart Pall, PA-C  Multiple Vitamins-Minerals (ICAPS PO) Take 1 capsule by mouth daily.    [provider]  spironolactone (ALDACTONE) 25 MG tablet Take 0.5 tablets (12.5 mg total) by mouth daily. 06/03/21 11/15/23  Ellsworth Lennox, PA-C    Physical Exam: Vitals:   11/18/23 1622 11/18/23 1626 11/18/23 2112  BP: (!) 171/77  (!) 151/68  Pulse: (!) 54  (!) 53  Resp: 17  17  Temp: 97.6 F (36.4 C)  97.9 F (36.6 C)  TempSrc: Oral  Axillary  SpO2: 91%  91%  Weight:  45.4 kg   Height:  5\' 3"  (1.6 m)    General - Elderly thin built Caucasian female, in distress due to pain HEENT - PERRLA, EOMI, atraumatic head, non tender sinuses. Lung - Clear, diffuse rhonchi, no wheezes. Heart - S1, S2 heard, no murmurs, rubs, no pedal edema. Abdomen - soft, non tender, non distended.  Neuro - Alert, awake, confused, non focal exam. Skin - Warm and dry. Data Reviewed:     Latest Ref Rng & Units 11/18/2023    5:42 PM 09/15/2023   10:57 PM 06/17/2023    1:21 PM  CBC  WBC  4.0 - 10.5 K/uL 7.2  12.7  7.5   Hemoglobin 12.0 - 15.0 g/dL 16.1  09.6  04.5   Hematocrit 36.0 - 46.0 % 46.5  42.6  43.8   Platelets 150 - 400 K/uL 263  206  198       Latest Ref Rng & Units 11/18/2023    5:42 PM 09/15/2023   10:57 PM 06/17/2023    1:21 PM  BMP  Glucose 70 - 99 mg/dL 409  811  914   BUN 8 - 23 mg/dL 35  31  21   Creatinine 0.44 - 1.00 mg/dL 7.82  9.56  2.13   Sodium 135 - 145 mmol/L 133  135  132   Potassium 3.5 - 5.1 mmol/L 5.1  5.1  5.0   Chloride 98 -  111 mmol/L 94  98  97   CO2 22 - 32 mmol/L 29  24  23    Calcium 8.9 - 10.3 mg/dL 8.8  9.4  9.2    DG Chest Portable 1 View  Result Date: 11/18/2023 CLINICAL DATA:  Dyspnea EXAM: PORTABLE CHEST 1 VIEW COMPARISON:  09/15/2023, 11/04/2021 FINDINGS: Emphysema and bronchitic changes. Mild diffuse increased interstitial opacity, difficult to exclude mild superimposed interstitial process such as edema. Cardiomegaly with aortic atherosclerosis. IMPRESSION: Emphysema and bronchitic changes. Mild diffuse increased interstitial opacity, difficult to exclude mild superimposed interstitial process such as edema. Electronically Signed   By: Jasmine Pang M.D.   On: 11/18/2023 21:46   CT L-SPINE NO CHARGE  Result Date: 11/18/2023 CLINICAL DATA:  Left lower quadrant pain dementia EXAM: CT Lumbar Spine without contrast TECHNIQUE: Technique: Multiplanar CT images of the lumbar spine were reconstructed from contemporary CT of the Abdomen and Pelvis. RADIATION DOSE REDUCTION: This exam was performed according to the departmental dose-optimization program which includes automated exposure control, adjustment of the mA and/or kV according to patient size and/or use of iterative reconstruction technique. CONTRAST:  None or No additional COMPARISON:  07/16/2023 FINDINGS: Segmentation: 5 lumbar type vertebrae. Alignment: Scoliosis. 7 mm anterolisthesis L4 on L5. Trace retrolisthesis L1 on L2. Vertebrae: Moderate chronic compression fracture at  L1, stable 6 mm retropulsion of the inferior vertebral body. Severe compression fracture L3, with progression of loss of vertebral body height for since July, now estimated at 75%. 8 mm retropulsion of the upper vertebral body, mass effect on the right anterior thecal sac and narrowing of the right lateral recess at L2-L3. Moderate chronic superior endplate deformity at L4. Acute appearing superior endplate compression fracture at T12. No significant retropulsion. Interval mild diffuse loss of vertebral height at L2 with heterogeneous areas of sclerosis, compatible with subacute fracture. Paraspinal and other soft tissues: See separately dictated abdominopelvic CT. Aneurysmal dilatation of the ascending aorta up to 4.1 cm. Mild paravertebral edema at T12. Disc levels: Disc space narrowing and vacuum disc at T12-L1. Mild disc space narrowing and vacuum disc at L4-L5. At least mild canal stenosis at L3-L4 and moderate canal stenosis at L4-L5. Advanced hypertrophic facet degenerative changes at multiple levels, worst at L4-L5. IMPRESSION: 1. Acute appearing superior endplate compression fracture at T12. No significant retropulsion. 2. Mild diffuse loss of height L2 vertebral body compared to prior lumbar CT with interval heterogeneous areas of sclerosis, suspected to represent subacute fracture. 3. Severe compression fracture at L3, with progression of loss of vertebral body height since July, now estimated at 75%. 8 mm retropulsion of the upper vertebral body, results in mass effect on the right anterior thecal sac and narrowing of the right lateral recess at L2-L3. 4. Chronic compression fractures at L1 and L4. 5. Scoliosis and degenerative changes. 6. Aneurysmal dilatation of the ascending aorta up to 4.1 cm. Recommend annual imaging followup by CTA or MRA. This recommendation follows 2010 ACCF/AHA/AATS/ACR/ASA/SCA/SCAI/SIR/STS/SVM Guidelines for the Diagnosis and Management of Patients with Thoracic Aortic Disease.  Circulation. 2010; 121: N829-F621. Aortic aneurysm NOS (ICD10-I71.9) Electronically Signed   By: Jasmine Pang M.D.   On: 11/18/2023 20:23   CT ABDOMEN PELVIS W CONTRAST  Result Date: 11/18/2023 CLINICAL DATA:  Left lower quadrant abdominal pain and worsening mental status. History of dementia. EXAM: CT ABDOMEN AND PELVIS WITH CONTRAST TECHNIQUE: Multidetector CT imaging of the abdomen and pelvis was performed using the standard protocol following bolus administration of intravenous contrast. RADIATION DOSE REDUCTION: This exam  was performed according to the departmental dose-optimization program which includes automated exposure control, adjustment of the mA and/or kV according to patient size and/or use of iterative reconstruction technique. CONTRAST:  75mL OMNIPAQUE IOHEXOL 300 MG/ML  SOLN COMPARISON:  CT pelvis 07/16/2023 and CT abdomen and pelvis 06/28/2020 FINDINGS: Lower chest: Cardiomegaly. Bibasilar atelectasis/scarring. No acute abnormality. Hepatobiliary: Cholecystectomy.  No acute abnormality. Pancreas: Unremarkable. Spleen: Unremarkable. Adrenals/Urinary Tract: Bilateral cortical renal atrophy. Dilated right extrarenal pelvis similar to prior. No urinary calculi. Unremarkable bladder. Stomach/Bowel: Normal caliber large and small bowel. Moderate stool in the colon. Stomach is within normal limits. Vascular/Lymphatic: Aortic atherosclerosis. No enlarged abdominal or pelvic lymph nodes. Reproductive: No acute abnormality. Other: Small volume free fluid in the pelvis. No free intraperitoneal air. Musculoskeletal: Demineralization. IM rod and screw fixation left femur. No acute fracture. See separate report for findings in the lumbar spine. IMPRESSION: 1. No acute abnormality in the abdomen or pelvis. See separate report for findings in the lumbar spine. 2. Small volume free fluid in the pelvis, nonspecific. 3. Moderate stool in the colon. Aortic Atherosclerosis (ICD10-I70.0). Electronically Signed    By: Minerva Fester M.D.   On: 11/18/2023 20:12   CT Head Wo Contrast  Result Date: 11/18/2023 CLINICAL DATA:  Dementia worsening mental status EXAM: CT HEAD WITHOUT CONTRAST TECHNIQUE: Contiguous axial images were obtained from the base of the skull through the vertex without intravenous contrast. RADIATION DOSE REDUCTION: This exam was performed according to the departmental dose-optimization program which includes automated exposure control, adjustment of the mA and/or kV according to patient size and/or use of iterative reconstruction technique. COMPARISON:  CT brain 09/15/2023 FINDINGS: Brain: No acute territorial infarction, hemorrhage or intracranial mass. Atrophy and mild chronic small vessel ischemic changes of the white matter. Stable ventricle size. Vascular: No hyperdense vessels.  Carotid vascular calcification Skull: Normal. Negative for fracture or focal lesion. Sinuses/Orbits: No acute finding. Postsurgical changes of the globes. Other: None IMPRESSION: 1. No CT evidence for acute intracranial abnormality. 2. Atrophy and mild chronic small vessel ischemic changes of the white matter. Electronically Signed   By: Jasmine Pang M.D.   On: 11/18/2023 20:06     Assessment and Plan: Miranda Ford is a 89 y.o. female with medical history significant of chronic systolic congestive heart failure, paroxysmal atrial tachycardia and amiodarone therapy, history of pulmonary embolism on Eliquis therapy, presented to the emergency department for evaluation of confusion, frequent falls.  Poor historian due to confusion.  Patient had CT L-spine shows compression fracture to T12, chronic compression fractures at L1-L4.  Plan: Frequent falls Acute compression fracture T12. Multiple lumbar fractures noted. Admit to medicine, telemetry service under observation. ED provider discussed with neurosurgery who recommended T SLO brace. Continue pain control with Tylenol, Toradol.  Allergy to codeine  noted. Encourage out of bed to chair.  Worsening dementia: Confusion Given her advanced age, dementia, frequent falls, overall poor prognosis I discussed with niece regarding palliative care consultation for comfort measures and possible disposition to hospice care.  Niece agreeable. Palliative care consulted.  Paroxysmal atrial tachycardia: HR well controlled around 55. Continue amiodarone therapy. Recent cardiology note reviewed.  Chronic systolic CHF: Not in exacerbation. Continue Coreg, Lasix. Hold Aldactone due to borderline high potassium.  Pulmonary embolism: Noted in 2017. Will hold off Eliquis due to frequent falls, nosebleeds.  Severe Malnutrition: BMI 17.71 Encourage oral diet, supplements. Dietician consult.   Advance Care Planning:   Code Status: Limited: Do not attempt resuscitation (DNR) -DNR-LIMITED -Do Not  Intubate/DNI  discussed with niece at bedside. Prior records reviewed.  Palliative care consultation for comfort care and disposition.  Consults: Palliative care.  Family Communication: discussed with patient's niece at bedside. She understands and agrees that her prognosis poor, agreed with palliative consult for comfort measures and disposition.  Severity of Illness: The appropriate patient status for this patient is OBSERVATION. Observation status is judged to be reasonable and necessary in order to provide the required intensity of service to ensure the patient's safety. The patient's presenting symptoms, physical exam findings, and initial radiographic and laboratory data in the context of their medical condition is felt to place them at decreased risk for further clinical deterioration. Furthermore, it is anticipated that the patient will be medically stable for discharge from the hospital within 2 midnights of admission.   Author: Marcelino Duster, MD 11/18/2023 10:27 PM  For on call review [email or web address].

## 2023-11-18 NOTE — ED Notes (Signed)
Pt placed on 2L [place], O2-94%

## 2023-11-19 ENCOUNTER — Encounter (HOSPITAL_COMMUNITY): Payer: Self-pay | Admitting: Internal Medicine

## 2023-11-19 DIAGNOSIS — Z86711 Personal history of pulmonary embolism: Secondary | ICD-10-CM | POA: Diagnosis not present

## 2023-11-19 DIAGNOSIS — Z515 Encounter for palliative care: Secondary | ICD-10-CM | POA: Diagnosis not present

## 2023-11-19 DIAGNOSIS — S22080G Wedge compression fracture of T11-T12 vertebra, subsequent encounter for fracture with delayed healing: Secondary | ICD-10-CM | POA: Diagnosis not present

## 2023-11-19 DIAGNOSIS — Y92009 Unspecified place in unspecified non-institutional (private) residence as the place of occurrence of the external cause: Secondary | ICD-10-CM

## 2023-11-19 DIAGNOSIS — Z66 Do not resuscitate: Secondary | ICD-10-CM | POA: Diagnosis not present

## 2023-11-19 DIAGNOSIS — J439 Emphysema, unspecified: Secondary | ICD-10-CM | POA: Diagnosis not present

## 2023-11-19 DIAGNOSIS — R0902 Hypoxemia: Secondary | ICD-10-CM | POA: Diagnosis present

## 2023-11-19 DIAGNOSIS — I5022 Chronic systolic (congestive) heart failure: Secondary | ICD-10-CM | POA: Diagnosis not present

## 2023-11-19 DIAGNOSIS — N1832 Chronic kidney disease, stage 3b: Secondary | ICD-10-CM | POA: Diagnosis not present

## 2023-11-19 DIAGNOSIS — Z681 Body mass index (BMI) 19 or less, adult: Secondary | ICD-10-CM | POA: Diagnosis not present

## 2023-11-19 DIAGNOSIS — R627 Adult failure to thrive: Secondary | ICD-10-CM | POA: Diagnosis present

## 2023-11-19 DIAGNOSIS — M4854XA Collapsed vertebra, not elsewhere classified, thoracic region, initial encounter for fracture: Secondary | ICD-10-CM | POA: Diagnosis not present

## 2023-11-19 DIAGNOSIS — Z87891 Personal history of nicotine dependence: Secondary | ICD-10-CM | POA: Diagnosis not present

## 2023-11-19 DIAGNOSIS — F03911 Unspecified dementia, unspecified severity, with agitation: Secondary | ICD-10-CM | POA: Diagnosis not present

## 2023-11-19 DIAGNOSIS — M4856XA Collapsed vertebra, not elsewhere classified, lumbar region, initial encounter for fracture: Secondary | ICD-10-CM | POA: Diagnosis not present

## 2023-11-19 DIAGNOSIS — I480 Paroxysmal atrial fibrillation: Secondary | ICD-10-CM | POA: Diagnosis not present

## 2023-11-19 DIAGNOSIS — W19XXXA Unspecified fall, initial encounter: Secondary | ICD-10-CM

## 2023-11-19 DIAGNOSIS — Z8249 Family history of ischemic heart disease and other diseases of the circulatory system: Secondary | ICD-10-CM | POA: Diagnosis not present

## 2023-11-19 DIAGNOSIS — E43 Unspecified severe protein-calorie malnutrition: Secondary | ICD-10-CM | POA: Diagnosis not present

## 2023-11-19 DIAGNOSIS — F0394 Unspecified dementia, unspecified severity, with anxiety: Secondary | ICD-10-CM | POA: Diagnosis not present

## 2023-11-19 DIAGNOSIS — Z7901 Long term (current) use of anticoagulants: Secondary | ICD-10-CM | POA: Diagnosis not present

## 2023-11-19 DIAGNOSIS — Z7189 Other specified counseling: Secondary | ICD-10-CM | POA: Diagnosis not present

## 2023-11-19 DIAGNOSIS — F0390 Unspecified dementia without behavioral disturbance: Secondary | ICD-10-CM | POA: Diagnosis not present

## 2023-11-19 DIAGNOSIS — R64 Cachexia: Secondary | ICD-10-CM | POA: Diagnosis not present

## 2023-11-19 DIAGNOSIS — Z880 Allergy status to penicillin: Secondary | ICD-10-CM | POA: Diagnosis not present

## 2023-11-19 DIAGNOSIS — I7121 Aneurysm of the ascending aorta, without rupture: Secondary | ICD-10-CM | POA: Diagnosis not present

## 2023-11-19 DIAGNOSIS — Z885 Allergy status to narcotic agent status: Secondary | ICD-10-CM | POA: Diagnosis not present

## 2023-11-19 DIAGNOSIS — S22080A Wedge compression fracture of T11-T12 vertebra, initial encounter for closed fracture: Secondary | ICD-10-CM | POA: Diagnosis not present

## 2023-11-19 DIAGNOSIS — I4719 Other supraventricular tachycardia: Secondary | ICD-10-CM | POA: Diagnosis not present

## 2023-11-19 DIAGNOSIS — I5082 Biventricular heart failure: Secondary | ICD-10-CM | POA: Diagnosis not present

## 2023-11-19 DIAGNOSIS — R296 Repeated falls: Secondary | ICD-10-CM | POA: Diagnosis not present

## 2023-11-19 LAB — CBC
HCT: 41.4 % (ref 36.0–46.0)
Hemoglobin: 13.6 g/dL (ref 12.0–15.0)
MCH: 31.7 pg (ref 26.0–34.0)
MCHC: 32.9 g/dL (ref 30.0–36.0)
MCV: 96.5 fL (ref 80.0–100.0)
Platelets: 261 10*3/uL (ref 150–400)
RBC: 4.29 MIL/uL (ref 3.87–5.11)
RDW: 15.7 % — ABNORMAL HIGH (ref 11.5–15.5)
WBC: 7 10*3/uL (ref 4.0–10.5)
nRBC: 0 % (ref 0.0–0.2)

## 2023-11-19 LAB — BASIC METABOLIC PANEL
Anion gap: 9 (ref 5–15)
BUN: 32 mg/dL — ABNORMAL HIGH (ref 8–23)
CO2: 29 mmol/L (ref 22–32)
Calcium: 8.5 mg/dL — ABNORMAL LOW (ref 8.9–10.3)
Chloride: 96 mmol/L — ABNORMAL LOW (ref 98–111)
Creatinine, Ser: 1.12 mg/dL — ABNORMAL HIGH (ref 0.44–1.00)
GFR, Estimated: 45 mL/min — ABNORMAL LOW (ref >60)
Glucose, Bld: 81 mg/dL (ref 70–99)
Potassium: 4.1 mmol/L (ref 3.5–5.1)
Sodium: 134 mmol/L — ABNORMAL LOW (ref 135–145)

## 2023-11-19 MED ORDER — ENSURE ENLIVE PO LIQD
237.0000 mL | Freq: Two times a day (BID) | ORAL | Status: DC
  Administered 2023-11-20 (×2): 237 mL via ORAL

## 2023-11-19 MED ORDER — OXYCODONE HCL 5 MG PO TABS
2.5000 mg | ORAL_TABLET | ORAL | Status: DC | PRN
  Administered 2023-11-21: 2.5 mg via ORAL
  Filled 2023-11-19: qty 1

## 2023-11-19 MED ORDER — LORAZEPAM 1 MG PO TABS
1.0000 mg | ORAL_TABLET | Freq: Four times a day (QID) | ORAL | Status: DC | PRN
  Administered 2023-11-19: 1 mg via ORAL
  Filled 2023-11-19: qty 1

## 2023-11-19 MED ORDER — ACETAMINOPHEN 500 MG PO TABS
1000.0000 mg | ORAL_TABLET | Freq: Four times a day (QID) | ORAL | Status: DC
  Administered 2023-11-19 – 2023-11-21 (×6): 1000 mg via ORAL
  Filled 2023-11-19 (×7): qty 2

## 2023-11-19 MED ORDER — OCUVITE-LUTEIN PO CAPS
1.0000 | ORAL_CAPSULE | Freq: Every day | ORAL | Status: DC
  Administered 2023-11-20: 1 via ORAL
  Filled 2023-11-19 (×2): qty 1

## 2023-11-19 NOTE — ED Provider Notes (Signed)
[place] EMERGENCY DEPARTMENT AT [facility] Provider Note  CSN: [id number] Arrival date & time: 11/18/23 1614  Chief Complaint(s) Altered Mental Status  HPI Miranda Ford is a 89 y.o. female with PMH dementia, CHF, paroxysmal A-fib on amiodarone, previous PE on low-dose Eliquis who presents emergency department for evaluation of severe back pain.  Patient reportedly suffered a fall in September 2024 but no additional falls have been reported.  She arrives with her niece and additional history obtained from the niece states that the patient has been fixated on a fixed delusion that she has cancer.  The patient here in the emergency room is reportedly frustrated with healthcare staff telling everyone that she has cancer.  Niece says that this is new behavior over the last 2 weeks and is significantly worsened from baseline.  Denies nausea, vomiting, chest pain, shortness of breath or other systemic symptoms.   Past Medical History Past Medical History:  Diagnosis Date   Cataracts, bilateral    Chronic systolic CHF (congestive heart failure) (HCC)    a. dx 06/2016 - conservative rx recommended. EF 20% with biventricular failure in setting of PE.   Closed fracture of left proximal humerus 07/11/2019   Demand ischemia (HCC)    Epistaxis    Macular degeneration    Dry OD; wet OS   Osteoporosis    07/18/16 previously on Premarin; S/P > 10 years Alendronate   Paroxysmal atrial tachycardia (HCC)    a. placed on amiodarone 06/2016.   Pulmonary emboli (HCC) 06/2016   Patient Active Problem List   Diagnosis Date Noted   Closed T12 fracture (HCC) 11/18/2023   Exudative age-related macular degeneration of right eye with active choroidal neovascularization (HCC) 04/11/2022   Retinal hemorrhage, right eye 04/11/2022   Advanced nonexudative age-related macular degeneration of left eye with subfoveal involvement 02/14/2021   Advanced nonexudative age-related macular degeneration of right  eye without subfoveal involvement 02/14/2021   Posterior vitreous detachment of both eyes 02/14/2021   Closed fracture of left proximal humerus 07/11/2019   Closed left hip fracture, initial encounter (HCC) 07/10/2019   Chronic systolic CHF (congestive heart failure) (HCC)    History of pulmonary embolism    Bradycardia    Senile osteoporosis 07/18/2016   DNR (do not resuscitate) discussion    Palliative care encounter    Epistaxis 07/07/2016   Acute systolic CHF (congestive heart failure) (HCC) 07/05/2016   Acute respiratory failure with hypoxia (HCC) 07/05/2016   Secondary cardiomyopathy (HCC)    Biventricular failure (HCC)    Atrial tachycardia (HCC)    Paroxysmal atrial tachycardia (HCC)    Demand ischemia (HCC)    Acute pulmonary embolism (HCC) 07/03/2016   COPD exacerbation (HCC) 07/03/2016   Cardiomegaly 07/03/2016   Coronary atherosclerosis of native coronary artery 07/03/2016   Elevated troponin    Home Medication(s) Prior to Admission medications   Medication Sig Start Date End Date Taking? Authorizing Provider  acetaminophen (TYLENOL) 325 MG tablet Take 1-2 tablets (325-650 mg total) by mouth every 6 (six) hours as needed for mild pain (pain score 1-3 or temp > 100.5). 07/14/19   Montez Morita, PA-C  alendronate (FOSAMAX) 70 MG tablet Take 70 mg by mouth once a week. 07/30/23   [provider]  amiodarone (PACERONE) 200 MG tablet TAKE (1/2) TABLET BY MOUTH ONCE DAILY. 11/20/22   Jonelle Sidle, MD  atorvastatin (LIPITOR) 40 MG tablet TAKE (1) TABLET BY MOUTH AT BEDTIME. 08/13/23   Jonelle Sidle, MD  calcium carbonate (OSCAL) 1500 (600 Ca) MG TABS tablet Take 1,500 mg by mouth daily.     [provider]  carvedilol (COREG) 3.125 MG tablet TAKE 1 TABLET BY MOUTH TWICE DAILY WITH A MEAL. Patient taking differently: Take 3.125 mg by mouth 2 (two) times daily with a meal. 04/25/18   Jodelle Gross, NP  ELIQUIS 2.5 MG TABS tablet TAKE 1 TABLET BY MOUTH  AT 10AM AND 1 TABLET AT 9PM. 09/26/23   Jonelle Sidle, MD  furosemide (LASIX) 20 MG tablet TAKE 1 TABLET BY MOUTH ONCE A DAY. 11/14/22   Strader, Lennart Pall, PA-C  Multiple Vitamins-Minerals (ICAPS PO) Take 1 capsule by mouth daily.    [provider]  spironolactone (ALDACTONE) 25 MG tablet Take 0.5 tablets (12.5 mg total) by mouth daily. 06/03/21 11/15/23  Ellsworth Lennox, PA-C                                                                                                                                    Past Surgical History Past Surgical History:  Procedure Laterality Date   CATARACT EXTRACTION     bilaterally   CHOLECYSTECTOMY     INTRAMEDULLARY (IM) NAIL INTERTROCHANTERIC Left 07/11/2019   Procedure: INTRAMEDULLARY (IM) NAIL INTERTROCHANTRIC;  Surgeon: Myrene Galas, MD;  Location: [facility] OR;  Service: Orthopedics;  Laterality: Left;   TONSILLECTOMY     Family History Family History  Problem Relation Age of Onset   Heart disease Mother    Stroke Mother    Diabetes Father    Cancer Sister        1 sister with metastatic bone cancer; 1 with breast cancer    Social History Social History   Tobacco Use   Smoking status: Former    Current packs/day: 0.00    Types: Cigarettes    Quit date: 1975    Years since quitting: 49.9   Smokeless tobacco: Never  Vaping Use   Vaping status: Never Used  Substance Use Topics   Alcohol use: No   Drug use: No   Allergies Codeine and Penicillins  Review of Systems Review of Systems  Musculoskeletal:  Positive for back pain.  Psychiatric/Behavioral:  Positive for confusion.     Physical Exam Vital Signs  I have reviewed the triage vital signs BP (!) 152/71   Pulse (!) 53   Temp 97.7 F (36.5 C)   Resp 20   Ht 5\' 3"  (1.6 m)   Wt 45.4 kg   SpO2 95%   BMI 17.71 kg/m   Physical Exam Vitals and nursing note reviewed.  Constitutional:      General: She is not in acute distress.    Appearance: She is  well-developed.  HENT:     Head: Normocephalic and atraumatic.  Eyes:     Conjunctiva/sclera: Conjunctivae normal.  Cardiovascular:     Rate and Rhythm: Normal rate and regular rhythm.  Heart sounds: No murmur heard. Pulmonary:     Effort: Pulmonary effort is normal. No respiratory distress.     Breath sounds: Normal breath sounds.  Abdominal:     Palpations: Abdomen is soft.     Tenderness: There is no abdominal tenderness.  Musculoskeletal:        General: Tenderness present. No swelling.     Cervical back: Neck supple.  Skin:    General: Skin is warm and dry.     Capillary Refill: Capillary refill takes less than 2 seconds.  Neurological:     Mental Status: She is alert. She is disoriented.  Psychiatric:        Mood and Affect: Mood normal.     ED Results and Treatments Labs (all labs ordered are listed, but only abnormal results are displayed) Labs Reviewed  COMPREHENSIVE METABOLIC PANEL - Abnormal; Notable for the following components:      Result Value   Sodium 133 (*)    Chloride 94 (*)    Glucose, Bld 107 (*)    BUN 35 (*)    Creatinine, Ser 1.32 (*)    Calcium 8.8 (*)    Albumin 3.4 (*)    Total Bilirubin 1.7 (*)    GFR, Estimated 37 (*)    All other components within normal limits  CBC WITH DIFFERENTIAL/PLATELET - Abnormal; Notable for the following components:   Hemoglobin 15.2 (*)    HCT 46.5 (*)    RDW 15.6 (*)    Lymphs Abs 0.6 (*)    All other components within normal limits  URINALYSIS, ROUTINE W REFLEX MICROSCOPIC - Abnormal; Notable for the following components:   Specific Gravity, Urine 1.040 (*)    Ketones, ur 5 (*)    All other components within normal limits  BRAIN NATRIURETIC PEPTIDE - Abnormal; Notable for the following components:   B Natriuretic Peptide 206.0 (*)    All other components within normal limits  BASIC METABOLIC PANEL - Abnormal; Notable for the following components:   Sodium 134 (*)    Chloride 96 (*)    BUN 32 (*)     Creatinine, Ser 1.12 (*)    Calcium 8.5 (*)    GFR, Estimated 45 (*)    All other components within normal limits  CBC - Abnormal; Notable for the following components:   RDW 15.7 (*)    All other components within normal limits                                                                                                                          Radiology DG Chest Portable 1 View  Result Date: 11/18/2023 CLINICAL DATA:  Dyspnea EXAM: PORTABLE CHEST 1 VIEW COMPARISON:  09/15/2023, 11/04/2021 FINDINGS: Emphysema and bronchitic changes. Mild diffuse increased interstitial opacity, difficult to exclude mild superimposed interstitial process such as edema. Cardiomegaly with aortic atherosclerosis. IMPRESSION: Emphysema and bronchitic changes. Mild diffuse increased interstitial opacity, difficult to exclude mild superimposed interstitial process  such as edema. Electronically Signed   By: Jasmine Pang M.D.   On: 11/18/2023 21:46   CT L-SPINE NO CHARGE  Result Date: 11/18/2023 CLINICAL DATA:  Left lower quadrant pain dementia EXAM: CT Lumbar Spine without contrast TECHNIQUE: Technique: Multiplanar CT images of the lumbar spine were reconstructed from contemporary CT of the Abdomen and Pelvis. RADIATION DOSE REDUCTION: This exam was performed according to the departmental dose-optimization program which includes automated exposure control, adjustment of the mA and/or kV according to patient size and/or use of iterative reconstruction technique. CONTRAST:  None or No additional COMPARISON:  07/16/2023 FINDINGS: Segmentation: 5 lumbar type vertebrae. Alignment: Scoliosis. 7 mm anterolisthesis L4 on L5. Trace retrolisthesis L1 on L2. Vertebrae: Moderate chronic compression fracture at L1, stable 6 mm retropulsion of the inferior vertebral body. Severe compression fracture L3, with progression of loss of vertebral body height for since July, now estimated at 75%. 8 mm retropulsion of the upper vertebral  body, mass effect on the right anterior thecal sac and narrowing of the right lateral recess at L2-L3. Moderate chronic superior endplate deformity at L4. Acute appearing superior endplate compression fracture at T12. No significant retropulsion. Interval mild diffuse loss of vertebral height at L2 with heterogeneous areas of sclerosis, compatible with subacute fracture. Paraspinal and other soft tissues: See separately dictated abdominopelvic CT. Aneurysmal dilatation of the ascending aorta up to 4.1 cm. Mild paravertebral edema at T12. Disc levels: Disc space narrowing and vacuum disc at T12-L1. Mild disc space narrowing and vacuum disc at L4-L5. At least mild canal stenosis at L3-L4 and moderate canal stenosis at L4-L5. Advanced hypertrophic facet degenerative changes at multiple levels, worst at L4-L5. IMPRESSION: 1. Acute appearing superior endplate compression fracture at T12. No significant retropulsion. 2. Mild diffuse loss of height L2 vertebral body compared to prior lumbar CT with interval heterogeneous areas of sclerosis, suspected to represent subacute fracture. 3. Severe compression fracture at L3, with progression of loss of vertebral body height since July, now estimated at 75%. 8 mm retropulsion of the upper vertebral body, results in mass effect on the right anterior thecal sac and narrowing of the right lateral recess at L2-L3. 4. Chronic compression fractures at L1 and L4. 5. Scoliosis and degenerative changes. 6. Aneurysmal dilatation of the ascending aorta up to 4.1 cm. Recommend annual imaging followup by CTA or MRA. This recommendation follows 2010 ACCF/AHA/AATS/ACR/ASA/SCA/SCAI/SIR/STS/SVM Guidelines for the Diagnosis and Management of Patients with Thoracic Aortic Disease. Circulation. 2010; 121: F621-H086. Aortic aneurysm NOS (ICD10-I71.9) Electronically Signed   By: Jasmine Pang M.D.   On: 11/18/2023 20:23   CT ABDOMEN PELVIS W CONTRAST  Result Date: 11/18/2023 CLINICAL DATA:  Left  lower quadrant abdominal pain and worsening mental status. History of dementia. EXAM: CT ABDOMEN AND PELVIS WITH CONTRAST TECHNIQUE: Multidetector CT imaging of the abdomen and pelvis was performed using the standard protocol following bolus administration of intravenous contrast. RADIATION DOSE REDUCTION: This exam was performed according to the departmental dose-optimization program which includes automated exposure control, adjustment of the mA and/or kV according to patient size and/or use of iterative reconstruction technique. CONTRAST:  75mL OMNIPAQUE IOHEXOL 300 MG/ML  SOLN COMPARISON:  CT pelvis 07/16/2023 and CT abdomen and pelvis 06/28/2020 FINDINGS: Lower chest: Cardiomegaly. Bibasilar atelectasis/scarring. No acute abnormality. Hepatobiliary: Cholecystectomy.  No acute abnormality. Pancreas: Unremarkable. Spleen: Unremarkable. Adrenals/Urinary Tract: Bilateral cortical renal atrophy. Dilated right extrarenal pelvis similar to prior. No urinary calculi. Unremarkable bladder. Stomach/Bowel: Normal caliber large and small bowel. Moderate stool in the  colon. Stomach is within normal limits. Vascular/Lymphatic: Aortic atherosclerosis. No enlarged abdominal or pelvic lymph nodes. Reproductive: No acute abnormality. Other: Small volume free fluid in the pelvis. No free intraperitoneal air. Musculoskeletal: Demineralization. IM rod and screw fixation left femur. No acute fracture. See separate report for findings in the lumbar spine. IMPRESSION: 1. No acute abnormality in the abdomen or pelvis. See separate report for findings in the lumbar spine. 2. Small volume free fluid in the pelvis, nonspecific. 3. Moderate stool in the colon. Aortic Atherosclerosis (ICD10-I70.0). Electronically Signed   By: Minerva Fester M.D.   On: 11/18/2023 20:12   CT Head Wo Contrast  Result Date: 11/18/2023 CLINICAL DATA:  Dementia worsening mental status EXAM: CT HEAD WITHOUT CONTRAST TECHNIQUE: Contiguous axial images were  obtained from the base of the skull through the vertex without intravenous contrast. RADIATION DOSE REDUCTION: This exam was performed according to the departmental dose-optimization program which includes automated exposure control, adjustment of the mA and/or kV according to patient size and/or use of iterative reconstruction technique. COMPARISON:  CT brain 09/15/2023 FINDINGS: Brain: No acute territorial infarction, hemorrhage or intracranial mass. Atrophy and mild chronic small vessel ischemic changes of the white matter. Stable ventricle size. Vascular: No hyperdense vessels.  Carotid vascular calcification Skull: Normal. Negative for fracture or focal lesion. Sinuses/Orbits: No acute finding. Postsurgical changes of the globes. Other: None IMPRESSION: 1. No CT evidence for acute intracranial abnormality. 2. Atrophy and mild chronic small vessel ischemic changes of the white matter. Electronically Signed   By: Jasmine Pang M.D.   On: 11/18/2023 20:06    Pertinent labs & imaging results that were available during my care of the patient were reviewed by me and considered in my medical decision making (see MDM for details).  Medications Ordered in ED Medications  acetaminophen (TYLENOL) tablet 325-650 mg (has no administration in time range)  carvedilol (COREG) tablet 3.125 mg (3.125 mg Oral Given 11/19/23 0940)  amiodarone (PACERONE) tablet 100 mg (100 mg Oral Given 11/19/23 0940)  calcium carbonate (OS-CAL - dosed in mg of elemental calcium) tablet 1,250 mg (1,250 mg Oral Patient Refused/Not Given 11/19/23 0940)  furosemide (LASIX) tablet 20 mg (20 mg Oral Given 11/19/23 0940)  docusate sodium (COLACE) capsule 100 mg (100 mg Oral Patient Refused/Not Given 11/19/23 0941)  bisacodyl (DULCOLAX) EC tablet 5 mg (has no administration in time range)  polyethylene glycol (MIRALAX / GLYCOLAX) packet 17 g (has no administration in time range)  ketorolac (TORADOL) 15 MG/ML injection 15 mg (15 mg  Intravenous Given 11/19/23 0940)  multivitamin-lutein (OCUVITE-LUTEIN) capsule 1 capsule (1 capsule Oral Patient Refused/Not Given 11/19/23 0948)  LORazepam (ATIVAN) tablet 1 mg (has no administration in time range)  iohexol (OMNIPAQUE) 300 MG/ML solution 75 mL (75 mLs Intravenous Contrast Given 11/18/23 1841)  Procedures .Critical Care  Performed by: Glendora Score, MD Authorized by: Glendora Score, MD   Critical care provider statement:    Critical care time (minutes):  30   Critical care was necessary to treat or prevent imminent or life-threatening deterioration of the following conditions:  Respiratory failure   Critical care was time spent personally by me on the following activities:  Development of treatment plan with patient or surrogate, discussions with consultants, evaluation of patient's response to treatment, examination of patient, ordering and review of laboratory studies, ordering and review of radiographic studies, ordering and performing treatments and interventions, pulse oximetry, re-evaluation of patient's condition and review of old charts   (including critical care time)  Medical Decision Making / ED Course   This patient presents to the ED for concern of back pain, this involves an extensive number of treatment options, and is a complaint that carries with it a high risk of complications and morbidity.  The differential diagnosis includes muscular strain/spasm, lumbago, disc herniation, spinal fracture, cauda equina, epidural abscess or hematoma, psoas abscess, pyelonephritis, retroperitoneal hematoma, aortic dissection  MDM: Patient seen emergency room for evaluation of back pain.  Physical exam with significant tenderness in the L-spine but is otherwise unremarkable.  Patient is disoriented with fixed repetitive phrases about her  having cancer.  Laboratory evaluation with a BNP of 206, sodium 133, BUN 35, creatinine 1.32, albumin 3.4, total bili 1.7, hemoglobin 15.2 but is otherwise unremarkable.  Patient is mildly hypoxic 90% on room air and was placed on 2 L nasal cannula with improvement of oxygen saturations.  Chest x-ray with emphysema and bronchitic changes with mild diffuse interstitial opacity.  Trauma imaging including CT head, abdomen pelvis overall unremarkable with no evidence of malignancy or acute intracranial injury, but does show an incidental 4.1 cm ascending aortic aneurysm.  CT L-spine showing an acute appearing superior endplate compression fracture of T12, diffuse height loss at L2 likely subacute fracture, severe compression fracture at L3 worse from previous with retropulsion, chronic compression fractures at L1 and L4.  I spoke with the neurosurgery team on-call who is recommending TLSO bracing but patient is not an operative candidate.  At this time, with persistent confusion and new oxygen requirement, patient require hospital admission.  Patient admitted   Additional history obtained: -Additional history obtained from niece -External records from outside source obtained and reviewed including: Chart review including previous notes, labs, imaging, consultation notes   Lab Tests: -I ordered, reviewed, and interpreted labs.   The pertinent results include:   Labs Reviewed  COMPREHENSIVE METABOLIC PANEL - Abnormal; Notable for the following components:      Result Value   Sodium 133 (*)    Chloride 94 (*)    Glucose, Bld 107 (*)    BUN 35 (*)    Creatinine, Ser 1.32 (*)    Calcium 8.8 (*)    Albumin 3.4 (*)    Total Bilirubin 1.7 (*)    GFR, Estimated 37 (*)    All other components within normal limits  CBC WITH DIFFERENTIAL/PLATELET - Abnormal; Notable for the following components:   Hemoglobin 15.2 (*)    HCT 46.5 (*)    RDW 15.6 (*)    Lymphs Abs 0.6 (*)    All other components within  normal limits  URINALYSIS, ROUTINE W REFLEX MICROSCOPIC - Abnormal; Notable for the following components:   Specific Gravity, Urine 1.040 (*)    Ketones, ur 5 (*)    All other components  within normal limits  BRAIN NATRIURETIC PEPTIDE - Abnormal; Notable for the following components:   B Natriuretic Peptide 206.0 (*)    All other components within normal limits  BASIC METABOLIC PANEL - Abnormal; Notable for the following components:   Sodium 134 (*)    Chloride 96 (*)    BUN 32 (*)    Creatinine, Ser 1.12 (*)    Calcium 8.5 (*)    GFR, Estimated 45 (*)    All other components within normal limits  CBC - Abnormal; Notable for the following components:   RDW 15.7 (*)    All other components within normal limits     Imaging Studies ordered: I ordered imaging studies including CT head, L-spine, abdomen pelvis, chest x-ray I independently visualized and interpreted imaging. I agree with the radiologist interpretation   Medicines ordered and prescription drug management: Meds ordered this encounter  Medications   iohexol (OMNIPAQUE) 300 MG/ML solution 75 mL   acetaminophen (TYLENOL) tablet 325-650 mg   carvedilol (COREG) tablet 3.125 mg   amiodarone (PACERONE) tablet 100 mg   DISCONTD: apixaban (ELIQUIS) tablet 2.5 mg   calcium carbonate (OS-CAL - dosed in mg of elemental calcium) tablet 1,250 mg   DISCONTD: ICaps CAPS   furosemide (LASIX) tablet 20 mg   docusate sodium (COLACE) capsule 100 mg   bisacodyl (DULCOLAX) EC tablet 5 mg   polyethylene glycol (MIRALAX / GLYCOLAX) packet 17 g   DISCONTD: ondansetron (ZOFRAN) injection 4 mg   DISCONTD: multivitamin (PROSIGHT) tablet 1 tablet   ketorolac (TORADOL) 15 MG/ML injection 15 mg   multivitamin-lutein (OCUVITE-LUTEIN) capsule 1 capsule   LORazepam (ATIVAN) tablet 1 mg    -I have reviewed the patients home medicines and have made adjustments as needed  Critical interventions Oxygen supplementation  Consultations  Obtained: I requested consultation with the neurosurgery team on-call,  and discussed lab and imaging findings as well as pertinent plan - they recommend: TLSO bracing, no operative intervention   Cardiac Monitoring: The patient was maintained on a cardiac monitor.  I personally viewed and interpreted the cardiac monitored which showed an underlying rhythm of: NSR  Social Determinants of Health:  Factors impacting patients care include: Has home health care   Reevaluation: After the interventions noted above, I reevaluated the patient and found that they have :improved  Co morbidities that complicate the patient evaluation  Past Medical History:  Diagnosis Date   Cataracts, bilateral    Chronic systolic CHF (congestive heart failure) (HCC)    a. dx 06/2016 - conservative rx recommended. EF 20% with biventricular failure in setting of PE.   Closed fracture of left proximal humerus 07/11/2019   Demand ischemia (HCC)    Epistaxis    Macular degeneration    Dry OD; wet OS   Osteoporosis    07/18/16 previously on Premarin; S/P > 10 years Alendronate   Paroxysmal atrial tachycardia (HCC)    a. placed on amiodarone 06/2016.   Pulmonary emboli (HCC) 06/2016      Dispostion: I considered admission for this patient, and given persistent altered mental status and new hypoxia patient require hospital admission     Final Clinical Impression(s) / ED Diagnoses Final diagnoses:  Altered mental status, unspecified altered mental status type  Compression fracture of lumbar vertebra, unspecified lumbar vertebral level, initial encounter (HCC)  Hypoxia     @PCDICTATION @    Glendora Score, MD 11/19/23 1139

## 2023-11-19 NOTE — Progress Notes (Signed)
PROGRESS NOTE    Miranda Ford  FAO:[id number] DOB: July 18, 1924 DOA: 11/18/2023 PCP: Assunta Found, MD    Brief Narrative:  89 year old with history of chronic systolic heart failure, progressive dementia, presence of atrial tachycardia on amiodarone therapy, history of PE on Eliquis, lives alone at home brought to the emergency department for progressive confusion, frequent fall.  Decline in her mental status and more agitated recently.  In the emergency room hemodynamically stable.  On 2 L oxygen.  Skeletal survey positive for multiple acute and chronic vertebral fractures without neurological compromise.  Hospitalist admission requested for management of hypoxemia, pain control and palliative consultation.  Subjective: Patient seen and examined.  He was still in the emergency room.  Hard of hearing.  Patient tells me "one of the hospice doctor is coming to talk to me then I will talk to funeral home about my funerals".  Patient complains of moderate pain on her back.  Patient's niece and one of her friend is at the bedside.  She looks fairly comfortable on 2 L oxygen. Had detailed conversation with palliative care.  Assessment & Plan:   Frequent falls Acute compression fracture T 82 Multiple old lumbar fractures Failure to thrive Paroxysmal atrial tachycardia Chronic systolic heart failure, currently compensated Severe protein calorie malnutrition, cachexia with BMI 17. Seeking comfort care and hospice.  Plan: Symptom management, start on around-the-clock Tylenol, small dose of oxycodone for symptom control.  Bowel management. Too cumbersome to wear lumbar brace, discontinued. Discontinue anticoagulation, discontinue statins We will continue amiodarone for rate control Patient is currently appropriate for hospice level of care, however she is not at end-of-life. Unsafe discharge home, does not have adequate support system. Admit to the hospital, can likely go to inpatient hospice.   Followed by palliative care. Do not escalate care.  No indication to continue to check labs and intervene.  Focus on comfort.   DVT prophylaxis: SCDs Start: 11/18/23 2250   Code Status: DNR with limited intervention, will change to DNR with comfort care Family Communication: Niece, healthcare power of attorney at bedside Disposition Plan: Status is: Observation The patient will require care spanning > 2 midnights and should be moved to inpatient because: Unsafe discharge planning     Consultants:  Palliative care  Procedures:  None  Antimicrobials:  None     Objective: Vitals:   11/19/23 0500 11/19/23 0615 11/19/23 0630 11/19/23 0745  BP: (!) 144/78 (!) 152/64 (!) 151/71 (!) 152/71  Pulse: (!) 52 (!) 50 (!) 53 (!) 53  Resp: 16 16 14 20   Temp:    97.7 F (36.5 C)  TempSrc:      SpO2: 94% 95% 97% 95%  Weight:      Height:       No intake or output data in the 24 hours ending 11/19/23 1301 Filed Weights   11/18/23 1626  Weight: 45.4 kg    Examination:  General exam: Appears calm and comfortable at rest. Respiratory system: Clear to auscultation. Respiratory effort normal.  Not in any distress.  No added sounds.  On 2 L oxygen for comfort. Cardiovascular system: S1 & S2 heard, RRR. No pedal edema. Gastrointestinal system: Soft.  Nontender.   Central nervous system: Alert and awake.  Mostly oriented to herself and situation.  Not oriented to time place.    Data Reviewed: I have personally reviewed following labs and imaging studies  CBC: Recent Labs  Lab 11/18/23 1742 11/19/23 0520  WBC 7.2 7.0  NEUTROABS 5.8  --  HGB 15.2* 13.6  HCT 46.5* 41.4  MCV 96.7 96.5  PLT 263 261   Basic Metabolic Panel: Recent Labs  Lab 11/18/23 1742 11/19/23 0520  NA 133* 134*  K 5.1 4.1  CL 94* 96*  CO2 29 29  GLUCOSE 107* 81  BUN 35* 32*  CREATININE 1.32* 1.12*  CALCIUM 8.8* 8.5*   GFR: Estimated Creatinine Clearance: 20.6 mL/min (A) (by C-G formula based  on SCr of 1.12 mg/dL (H)). Liver Function Tests: Recent Labs  Lab 11/18/23 1742  AST 32  ALT 19  ALKPHOS 91  BILITOT 1.7*  PROT 7.0  ALBUMIN 3.4*   No results for input(s): "LIPASE", "AMYLASE" in the last 168 hours. No results for input(s): "AMMONIA" in the last 168 hours. Coagulation Profile: No results for input(s): "INR", "PROTIME" in the last 168 hours. Cardiac Enzymes: No results for input(s): "CKTOTAL", "CKMB", "CKMBINDEX", "TROPONINI" in the last 168 hours. BNP (last 3 results) No results for input(s): "PROBNP" in the last 8760 hours. HbA1C: No results for input(s): "HGBA1C" in the last 72 hours. CBG: No results for input(s): "GLUCAP" in the last 168 hours. Lipid Profile: No results for input(s): "CHOL", "HDL", "LDLCALC", "TRIG", "CHOLHDL", "LDLDIRECT" in the last 72 hours. Thyroid Function Tests: No results for input(s): "TSH", "T4TOTAL", "FREET4", "T3FREE", "THYROIDAB" in the last 72 hours. Anemia Panel: No results for input(s): "VITAMINB12", "FOLATE", "FERRITIN", "TIBC", "IRON", "RETICCTPCT" in the last 72 hours. Sepsis Labs: No results for input(s): "PROCALCITON", "LATICACIDVEN" in the last 168 hours.  No results found for this or any previous visit (from the past 240 hour(s)).       Radiology Studies: DG Chest Portable 1 View  Result Date: 11/18/2023 CLINICAL DATA:  Dyspnea EXAM: PORTABLE CHEST 1 VIEW COMPARISON:  09/15/2023, 11/04/2021 FINDINGS: Emphysema and bronchitic changes. Mild diffuse increased interstitial opacity, difficult to exclude mild superimposed interstitial process such as edema. Cardiomegaly with aortic atherosclerosis. IMPRESSION: Emphysema and bronchitic changes. Mild diffuse increased interstitial opacity, difficult to exclude mild superimposed interstitial process such as edema. Electronically Signed   By: Jasmine Pang M.D.   On: 11/18/2023 21:46   CT L-SPINE NO CHARGE  Result Date: 11/18/2023 CLINICAL DATA:  Left lower quadrant pain  dementia EXAM: CT Lumbar Spine without contrast TECHNIQUE: Technique: Multiplanar CT images of the lumbar spine were reconstructed from contemporary CT of the Abdomen and Pelvis. RADIATION DOSE REDUCTION: This exam was performed according to the departmental dose-optimization program which includes automated exposure control, adjustment of the mA and/or kV according to patient size and/or use of iterative reconstruction technique. CONTRAST:  None or No additional COMPARISON:  07/16/2023 FINDINGS: Segmentation: 5 lumbar type vertebrae. Alignment: Scoliosis. 7 mm anterolisthesis L4 on L5. Trace retrolisthesis L1 on L2. Vertebrae: Moderate chronic compression fracture at L1, stable 6 mm retropulsion of the inferior vertebral body. Severe compression fracture L3, with progression of loss of vertebral body height for since July, now estimated at 75%. 8 mm retropulsion of the upper vertebral body, mass effect on the right anterior thecal sac and narrowing of the right lateral recess at L2-L3. Moderate chronic superior endplate deformity at L4. Acute appearing superior endplate compression fracture at T12. No significant retropulsion. Interval mild diffuse loss of vertebral height at L2 with heterogeneous areas of sclerosis, compatible with subacute fracture. Paraspinal and other soft tissues: See separately dictated abdominopelvic CT. Aneurysmal dilatation of the ascending aorta up to 4.1 cm. Mild paravertebral edema at T12. Disc levels: Disc space narrowing and vacuum disc at T12-L1. Mild disc  space narrowing and vacuum disc at L4-L5. At least mild canal stenosis at L3-L4 and moderate canal stenosis at L4-L5. Advanced hypertrophic facet degenerative changes at multiple levels, worst at L4-L5. IMPRESSION: 1. Acute appearing superior endplate compression fracture at T12. No significant retropulsion. 2. Mild diffuse loss of height L2 vertebral body compared to prior lumbar CT with interval heterogeneous areas of sclerosis,  suspected to represent subacute fracture. 3. Severe compression fracture at L3, with progression of loss of vertebral body height since July, now estimated at 75%. 8 mm retropulsion of the upper vertebral body, results in mass effect on the right anterior thecal sac and narrowing of the right lateral recess at L2-L3. 4. Chronic compression fractures at L1 and L4. 5. Scoliosis and degenerative changes. 6. Aneurysmal dilatation of the ascending aorta up to 4.1 cm. Recommend annual imaging followup by CTA or MRA. This recommendation follows 2010 ACCF/AHA/AATS/ACR/ASA/SCA/SCAI/SIR/STS/SVM Guidelines for the Diagnosis and Management of Patients with Thoracic Aortic Disease. Circulation. 2010; 121: U981-X914. Aortic aneurysm NOS (ICD10-I71.9) Electronically Signed   By: Jasmine Pang M.D.   On: 11/18/2023 20:23   CT ABDOMEN PELVIS W CONTRAST  Result Date: 11/18/2023 CLINICAL DATA:  Left lower quadrant abdominal pain and worsening mental status. History of dementia. EXAM: CT ABDOMEN AND PELVIS WITH CONTRAST TECHNIQUE: Multidetector CT imaging of the abdomen and pelvis was performed using the standard protocol following bolus administration of intravenous contrast. RADIATION DOSE REDUCTION: This exam was performed according to the departmental dose-optimization program which includes automated exposure control, adjustment of the mA and/or kV according to patient size and/or use of iterative reconstruction technique. CONTRAST:  75mL OMNIPAQUE IOHEXOL 300 MG/ML  SOLN COMPARISON:  CT pelvis 07/16/2023 and CT abdomen and pelvis 06/28/2020 FINDINGS: Lower chest: Cardiomegaly. Bibasilar atelectasis/scarring. No acute abnormality. Hepatobiliary: Cholecystectomy.  No acute abnormality. Pancreas: Unremarkable. Spleen: Unremarkable. Adrenals/Urinary Tract: Bilateral cortical renal atrophy. Dilated right extrarenal pelvis similar to prior. No urinary calculi. Unremarkable bladder. Stomach/Bowel: Normal caliber large and small  bowel. Moderate stool in the colon. Stomach is within normal limits. Vascular/Lymphatic: Aortic atherosclerosis. No enlarged abdominal or pelvic lymph nodes. Reproductive: No acute abnormality. Other: Small volume free fluid in the pelvis. No free intraperitoneal air. Musculoskeletal: Demineralization. IM rod and screw fixation left femur. No acute fracture. See separate report for findings in the lumbar spine. IMPRESSION: 1. No acute abnormality in the abdomen or pelvis. See separate report for findings in the lumbar spine. 2. Small volume free fluid in the pelvis, nonspecific. 3. Moderate stool in the colon. Aortic Atherosclerosis (ICD10-I70.0). Electronically Signed   By: Minerva Fester M.D.   On: 11/18/2023 20:12   CT Head Wo Contrast  Result Date: 11/18/2023 CLINICAL DATA:  Dementia worsening mental status EXAM: CT HEAD WITHOUT CONTRAST TECHNIQUE: Contiguous axial images were obtained from the base of the skull through the vertex without intravenous contrast. RADIATION DOSE REDUCTION: This exam was performed according to the departmental dose-optimization program which includes automated exposure control, adjustment of the mA and/or kV according to patient size and/or use of iterative reconstruction technique. COMPARISON:  CT brain 09/15/2023 FINDINGS: Brain: No acute territorial infarction, hemorrhage or intracranial mass. Atrophy and mild chronic small vessel ischemic changes of the white matter. Stable ventricle size. Vascular: No hyperdense vessels.  Carotid vascular calcification Skull: Normal. Negative for fracture or focal lesion. Sinuses/Orbits: No acute finding. Postsurgical changes of the globes. Other: None IMPRESSION: 1. No CT evidence for acute intracranial abnormality. 2. Atrophy and mild chronic small vessel ischemic changes of the  white matter. Electronically Signed   By: Jasmine Pang M.D.   On: 11/18/2023 20:06        Scheduled Meds:  acetaminophen  1,000 mg Oral Q6H   amiodarone   100 mg Oral Daily   calcium carbonate  1,250 mg Oral Q breakfast   carvedilol  3.125 mg Oral BID WC   docusate sodium  100 mg Oral BID   feeding supplement  237 mL Oral BID BM   furosemide  20 mg Oral Daily   multivitamin-lutein  1 capsule Oral Daily   Continuous Infusions:   LOS: 0 days    Time spent: 40 minutes    Dorcas Carrow, MD [facility]

## 2023-11-19 NOTE — ED Notes (Signed)
Report given to [facility] on floor, to transport patient soon as possible.

## 2023-11-19 NOTE — ED Notes (Signed)
Called [facility] 8:20
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
## 2023-11-19 NOTE — ED Notes (Signed)
Pt refused TLSO brace to be put on

## 2023-11-19 NOTE — TOC Initial Note (Signed)
Transition of Care [facility]) - Initial/Assessment Note    Patient Details  Name: Miranda Ford MRN: [id number] Date of Birth: January 27, 1924  Transition of Care [facility]) CM/SW Contact:    Elliot Gault, LCSW Phone Number: 11/19/2023, 2:13 PM  Clinical Narrative:                  Pt admitted from home. She is telling staff that she wants to go to residential hospice. Palliative APNP has consulted and stated that she spoke with pt and her niece who are both requesting referral to [employer].  Referral made to [facility] at [place]. Will await their review and eligibility determination.   Barriers to Discharge: Continued Medical Work up   Patient Goals and CMS Choice Patient states their goals for this hospitalization and ongoing recovery are:: residential hospice CMS Medicare.gov Compare Post Acute Care list provided to:: Patient Represenative (must comment) Choice offered to / list presented to : Adult Children      Expected Discharge Plan and Services In-house Referral: Clinical Social Work   Post Acute Care Choice: Hospice Living arrangements for the past 2 months: Single Family Home                                      Prior Living Arrangements/Services Living arrangements for the past 2 months: Single Family Home Lives with:: Self Patient language and need for interpreter reviewed:: Yes Do you feel safe going back to the place where you live?: Yes          Current home services: DME Criminal Activity/Legal Involvement Pertinent to Current Situation/Hospitalization: No - Comment as needed  Activities of Daily Living   ADL Screening (condition at time of admission) Independently performs ADLs?: No Does the patient have a NEW difficulty with bathing/dressing/toileting/self-feeding that is expected to last >3 days?: No Does the patient have a NEW difficulty with getting in/out of bed, walking, or climbing stairs that is expected to last >3 days?: No Does the  patient have a NEW difficulty with communication that is expected to last >3 days?: No Is the patient deaf or have difficulty hearing?: No Does the patient have difficulty seeing, even when wearing glasses/contacts?: No Does the patient have difficulty concentrating, remembering, or making decisions?: Yes  Permission Sought/Granted Permission sought to share information with : Oceanographer granted to share information with : Yes, Verbal Permission Granted     Permission granted to share info w AGENCY: [employer]        Emotional Assessment Appearance:: Appears stated age     Orientation: : Oriented to Self, Oriented to Place, Oriented to  Time, Oriented to Situation Alcohol / Substance Use: Not Applicable Psych Involvement: No (comment)  Admission diagnosis:  Hypoxia [R09.02] Closed T12 fracture (HCC) [S22.089A] Altered mental status, unspecified altered mental status type [R41.82] Compression fracture of lumbar vertebra, unspecified lumbar vertebral level, initial encounter (HCC) [S32.000A] Failure to thrive in adult [R62.7] Patient Active Problem List   Diagnosis Date Noted   Failure to thrive in adult 11/19/2023   Closed T12 fracture (HCC) 11/18/2023   Exudative age-related macular degeneration of right eye with active choroidal neovascularization (HCC) 04/11/2022   Retinal hemorrhage, right eye 04/11/2022   Advanced nonexudative age-related macular degeneration of left eye with subfoveal involvement 02/14/2021   Advanced nonexudative age-related macular degeneration of right eye without subfoveal involvement 02/14/2021   Posterior vitreous  detachment of both eyes 02/14/2021   Closed fracture of left proximal humerus 07/11/2019   Closed left hip fracture, initial encounter (HCC) 07/10/2019   Chronic systolic CHF (congestive heart failure) (HCC)    History of pulmonary embolism    Bradycardia    Senile osteoporosis 07/18/2016   DNR (do not  resuscitate) discussion    Palliative care encounter    Epistaxis 07/07/2016   Acute systolic CHF (congestive heart failure) (HCC) 07/05/2016   Acute respiratory failure with hypoxia (HCC) 07/05/2016   Secondary cardiomyopathy (HCC)    Biventricular failure (HCC)    Atrial tachycardia (HCC)    Paroxysmal atrial tachycardia (HCC)    Demand ischemia (HCC)    Acute pulmonary embolism (HCC) 07/03/2016   COPD exacerbation (HCC) 07/03/2016   Cardiomegaly 07/03/2016   Coronary atherosclerosis of native coronary artery 07/03/2016   Elevated troponin    PCP:  Assunta Found, MD Pharmacy:   [facility], [place] - [address] [place] [address] Phone: [phone] Fax: [phone]     Social Determinants of Health (SDOH) Social History: SDOH Screenings   Food Insecurity: No Food Insecurity (11/19/2023)  Housing: Low Risk  (11/19/2023)  Transportation Needs: No Transportation Needs (11/19/2023)  Utilities: Not At Risk (11/19/2023)  Depression (PHQ2-9): Low Risk  (08/12/2019)  Tobacco Use: Medium Risk (11/19/2023)   SDOH Interventions:     Readmission Risk Interventions     No data to display

## 2023-11-20 DIAGNOSIS — Z7189 Other specified counseling: Secondary | ICD-10-CM | POA: Diagnosis not present

## 2023-11-20 DIAGNOSIS — S22080A Wedge compression fracture of T11-T12 vertebra, initial encounter for closed fracture: Secondary | ICD-10-CM | POA: Diagnosis not present

## 2023-11-20 DIAGNOSIS — Y92009 Unspecified place in unspecified non-institutional (private) residence as the place of occurrence of the external cause: Secondary | ICD-10-CM | POA: Diagnosis not present

## 2023-11-20 DIAGNOSIS — E43 Unspecified severe protein-calorie malnutrition: Secondary | ICD-10-CM | POA: Insufficient documentation

## 2023-11-20 DIAGNOSIS — R627 Adult failure to thrive: Secondary | ICD-10-CM | POA: Diagnosis not present

## 2023-11-20 DIAGNOSIS — W19XXXA Unspecified fall, initial encounter: Secondary | ICD-10-CM | POA: Diagnosis not present

## 2023-11-20 DIAGNOSIS — Z515 Encounter for palliative care: Secondary | ICD-10-CM | POA: Diagnosis not present

## 2023-11-20 NOTE — Progress Notes (Signed)
   11/20/23 1718  Spiritual Encounters  Type of Visit Initial  Care provided to: Pt and family  Referral source Chaplain team  Reason for visit End-of-life  OnCall Visit No  Interventions  Spiritual Care Interventions Made Established relationship of care and support;Compassionate presence;Reflective listening;Narrative/life review;Prayer   Chaplain visited Pt at the suggestion of lead chaplain, as Pt is nearing the end of life.  Upon entering the room chaplain asked Pt, "How are you today?" Pt replied, "Ok guess, I expect to be leaving this world soon." Pt went on to tell me she was 24 and lived a full life, had no regrets, and was ready to go. Chaplain explored Pt's sources hope and discovered her strong Christian faith.  Pt chart indicated cognitive issues, but Pt seems to be clear on her prognosis and is at peace with it.  Pt did express strong desire for her pastor to come see her one more time before she goes.  Whether right or not, the Pt believes she will pass this evening. Chaplian will continue to check in as long as Pt remains here

## 2023-11-20 NOTE — Progress Notes (Signed)
Palliative: Miranda Ford is resting quietly in bed.  She appears acutely/chronically ill and frail, elderly.  She has known dementia with anxiety, therefore I do not wake her.  She has a paid caregiver at bedside.  No family present.  Call to niece/HCPOA, Miranda Ford.  We talked about the plan for hospice evaluation 11/20 7 in the morning.  No questions or concerns at this time.  Conference with attending, bedside nursing staff, transition of care team related to patient condition, needs, goals of care, disposition.  Plan: Requesting comfort and dignity at end-of-life, residential hospice placement at [facility].  [facility] RN to evaluate 11/20 7 in the morning. DNR/goldenrod form is on chart.  25 minutes  Miranda Carmel, NP Palliative medicine team Team phone [phone]

## 2023-11-20 NOTE — Progress Notes (Signed)
PROGRESS NOTE    Miranda Ford  EPP:[id number] DOB: 12-May-1924 DOA: 11/18/2023 PCP: Assunta Found, MD    Brief Narrative:  89 year old with history of chronic systolic heart failure, progressive dementia, presence of atrial tachycardia on amiodarone therapy, history of PE on Eliquis, lives alone at home brought to the emergency department for progressive confusion, frequent fall.  Decline in her mental status and more agitated recently.  In the emergency room hemodynamically stable.  On 2 L oxygen.  Skeletal survey positive for multiple acute and chronic vertebral fractures without neurological compromise.  Hospitalist admission requested for management of hypoxemia, pain control and palliative consultation.  Subjective:  Patient was seen and examined.  She is fixated on "I am supposed to go to hospice to die and funeral home is ready for me".  Notes anxious but not in any distress.  Assessment & Plan:   Frequent falls Acute compression fracture T 47 Multiple old lumbar vertebral fractures Failure to thrive Paroxysmal atrial tachycardia Chronic systolic heart failure, currently compensated Severe protein calorie malnutrition, cachexia with BMI 17. Seeking comfort care and hospice.  Plan: Symptom management, around-the-clock Tylenol, small dose of oxycodone for symptom control.  Bowel management. Discontinue anticoagulation, discontinue statins We will continue amiodarone for rate control. Unsafe discharge home, does not have adequate support system. Followed by palliative care.  Waiting for inpatient hospice evaluation.  Continue to provide care. Do not escalate care.  No indication to continue to check labs and intervene.  Focus on comfort.   DVT prophylaxis: SCDs Start: 11/18/23 2250   Code Status: DNR with comfort care. Family Communication: Niece, healthcare power of attorney Disposition Plan: Status is: Inpatient.  Unsafe discharge plan.     Consultants:  Palliative  care  Procedures:  None  Antimicrobials:  None     Objective: Vitals:   11/19/23 2009 11/19/23 2300 11/20/23 0300 11/20/23 1000  BP: (!) 84/51 (!) 94/54 (!) 84/54 (!) 95/57  Pulse: 68 (!) 56 (!) 52 (!) 56  Resp: 18 18 19 18   Temp: 97.7 F (36.5 C) (!) 97.4 F (36.3 C) (!) 97.5 F (36.4 C)   TempSrc: Oral Axillary Axillary   SpO2:  99% 95% 97%  Weight:      Height:        Intake/Output Summary (Last 24 hours) at 11/20/2023 1145 Last data filed at 11/20/2023 0900 Gross per 24 hour  Intake 480 ml  Output 100 ml  Net 380 ml   Filed Weights   11/18/23 1626  Weight: 45.4 kg    Examination:  General exam: Appears mildly anxious.  Difficult to keep up conversation but occasionally pleasant to interact. Respiratory system: Clear to auscultation. Respiratory effort normal.  Not in any distress.  No added sounds.  On room air.  Cardiovascular system: S1 & S2 heard, RRR. No pedal edema. Gastrointestinal system: Soft.  Nontender.   Central nervous system: Alert and awake.  Mostly oriented to herself and situation.  Not oriented to time place.    Data Reviewed: I have personally reviewed following labs and imaging studies  CBC: Recent Labs  Lab 11/18/23 1742 11/19/23 0520  WBC 7.2 7.0  NEUTROABS 5.8  --   HGB 15.2* 13.6  HCT 46.5* 41.4  MCV 96.7 96.5  PLT 263 261   Basic Metabolic Panel: Recent Labs  Lab 11/18/23 1742 11/19/23 0520  NA 133* 134*  K 5.1 4.1  CL 94* 96*  CO2 29 29  GLUCOSE 107* 81  BUN 35* 32*  CREATININE 1.32* 1.12*  CALCIUM 8.8* 8.5*   GFR: Estimated Creatinine Clearance: 20.6 mL/min (A) (by C-G formula based on SCr of 1.12 mg/dL (H)). Liver Function Tests: Recent Labs  Lab 11/18/23 1742  AST 32  ALT 19  ALKPHOS 91  BILITOT 1.7*  PROT 7.0  ALBUMIN 3.4*   No results for input(s): "LIPASE", "AMYLASE" in the last 168 hours. No results for input(s): "AMMONIA" in the last 168 hours. Coagulation Profile: No results for input(s):  "INR", "PROTIME" in the last 168 hours. Cardiac Enzymes: No results for input(s): "CKTOTAL", "CKMB", "CKMBINDEX", "TROPONINI" in the last 168 hours. BNP (last 3 results) No results for input(s): "PROBNP" in the last 8760 hours. HbA1C: No results for input(s): "HGBA1C" in the last 72 hours. CBG: No results for input(s): "GLUCAP" in the last 168 hours. Lipid Profile: No results for input(s): "CHOL", "HDL", "LDLCALC", "TRIG", "CHOLHDL", "LDLDIRECT" in the last 72 hours. Thyroid Function Tests: No results for input(s): "TSH", "T4TOTAL", "FREET4", "T3FREE", "THYROIDAB" in the last 72 hours. Anemia Panel: No results for input(s): "VITAMINB12", "FOLATE", "FERRITIN", "TIBC", "IRON", "RETICCTPCT" in the last 72 hours. Sepsis Labs: No results for input(s): "PROCALCITON", "LATICACIDVEN" in the last 168 hours.  No results found for this or any previous visit (from the past 240 hour(s)).       Radiology Studies: DG Chest Portable 1 View  Result Date: 11/18/2023 CLINICAL DATA:  Dyspnea EXAM: PORTABLE CHEST 1 VIEW COMPARISON:  09/15/2023, 11/04/2021 FINDINGS: Emphysema and bronchitic changes. Mild diffuse increased interstitial opacity, difficult to exclude mild superimposed interstitial process such as edema. Cardiomegaly with aortic atherosclerosis. IMPRESSION: Emphysema and bronchitic changes. Mild diffuse increased interstitial opacity, difficult to exclude mild superimposed interstitial process such as edema. Electronically Signed   By: Jasmine Pang M.D.   On: 11/18/2023 21:46   CT L-SPINE NO CHARGE  Result Date: 11/18/2023 CLINICAL DATA:  Left lower quadrant pain dementia EXAM: CT Lumbar Spine without contrast TECHNIQUE: Technique: Multiplanar CT images of the lumbar spine were reconstructed from contemporary CT of the Abdomen and Pelvis. RADIATION DOSE REDUCTION: This exam was performed according to the departmental dose-optimization program which includes automated exposure control,  adjustment of the mA and/or kV according to patient size and/or use of iterative reconstruction technique. CONTRAST:  None or No additional COMPARISON:  07/16/2023 FINDINGS: Segmentation: 5 lumbar type vertebrae. Alignment: Scoliosis. 7 mm anterolisthesis L4 on L5. Trace retrolisthesis L1 on L2. Vertebrae: Moderate chronic compression fracture at L1, stable 6 mm retropulsion of the inferior vertebral body. Severe compression fracture L3, with progression of loss of vertebral body height for since July, now estimated at 75%. 8 mm retropulsion of the upper vertebral body, mass effect on the right anterior thecal sac and narrowing of the right lateral recess at L2-L3. Moderate chronic superior endplate deformity at L4. Acute appearing superior endplate compression fracture at T12. No significant retropulsion. Interval mild diffuse loss of vertebral height at L2 with heterogeneous areas of sclerosis, compatible with subacute fracture. Paraspinal and other soft tissues: See separately dictated abdominopelvic CT. Aneurysmal dilatation of the ascending aorta up to 4.1 cm. Mild paravertebral edema at T12. Disc levels: Disc space narrowing and vacuum disc at T12-L1. Mild disc space narrowing and vacuum disc at L4-L5. At least mild canal stenosis at L3-L4 and moderate canal stenosis at L4-L5. Advanced hypertrophic facet degenerative changes at multiple levels, worst at L4-L5. IMPRESSION: 1. Acute appearing superior endplate compression fracture at T12. No significant retropulsion. 2. Mild diffuse loss of height L2 vertebral body  compared to prior lumbar CT with interval heterogeneous areas of sclerosis, suspected to represent subacute fracture. 3. Severe compression fracture at L3, with progression of loss of vertebral body height since July, now estimated at 75%. 8 mm retropulsion of the upper vertebral body, results in mass effect on the right anterior thecal sac and narrowing of the right lateral recess at L2-L3. 4.  Chronic compression fractures at L1 and L4. 5. Scoliosis and degenerative changes. 6. Aneurysmal dilatation of the ascending aorta up to 4.1 cm. Recommend annual imaging followup by CTA or MRA. This recommendation follows 2010 ACCF/AHA/AATS/ACR/ASA/SCA/SCAI/SIR/STS/SVM Guidelines for the Diagnosis and Management of Patients with Thoracic Aortic Disease. Circulation. 2010; 121: O130-Q657. Aortic aneurysm NOS (ICD10-I71.9) Electronically Signed   By: Jasmine Pang M.D.   On: 11/18/2023 20:23   CT ABDOMEN PELVIS W CONTRAST  Result Date: 11/18/2023 CLINICAL DATA:  Left lower quadrant abdominal pain and worsening mental status. History of dementia. EXAM: CT ABDOMEN AND PELVIS WITH CONTRAST TECHNIQUE: Multidetector CT imaging of the abdomen and pelvis was performed using the standard protocol following bolus administration of intravenous contrast. RADIATION DOSE REDUCTION: This exam was performed according to the departmental dose-optimization program which includes automated exposure control, adjustment of the mA and/or kV according to patient size and/or use of iterative reconstruction technique. CONTRAST:  75mL OMNIPAQUE IOHEXOL 300 MG/ML  SOLN COMPARISON:  CT pelvis 07/16/2023 and CT abdomen and pelvis 06/28/2020 FINDINGS: Lower chest: Cardiomegaly. Bibasilar atelectasis/scarring. No acute abnormality. Hepatobiliary: Cholecystectomy.  No acute abnormality. Pancreas: Unremarkable. Spleen: Unremarkable. Adrenals/Urinary Tract: Bilateral cortical renal atrophy. Dilated right extrarenal pelvis similar to prior. No urinary calculi. Unremarkable bladder. Stomach/Bowel: Normal caliber large and small bowel. Moderate stool in the colon. Stomach is within normal limits. Vascular/Lymphatic: Aortic atherosclerosis. No enlarged abdominal or pelvic lymph nodes. Reproductive: No acute abnormality. Other: Small volume free fluid in the pelvis. No free intraperitoneal air. Musculoskeletal: Demineralization. IM rod and screw  fixation left femur. No acute fracture. See separate report for findings in the lumbar spine. IMPRESSION: 1. No acute abnormality in the abdomen or pelvis. See separate report for findings in the lumbar spine. 2. Small volume free fluid in the pelvis, nonspecific. 3. Moderate stool in the colon. Aortic Atherosclerosis (ICD10-I70.0). Electronically Signed   By: Minerva Fester M.D.   On: 11/18/2023 20:12   CT Head Wo Contrast  Result Date: 11/18/2023 CLINICAL DATA:  Dementia worsening mental status EXAM: CT HEAD WITHOUT CONTRAST TECHNIQUE: Contiguous axial images were obtained from the base of the skull through the vertex without intravenous contrast. RADIATION DOSE REDUCTION: This exam was performed according to the departmental dose-optimization program which includes automated exposure control, adjustment of the mA and/or kV according to patient size and/or use of iterative reconstruction technique. COMPARISON:  CT brain 09/15/2023 FINDINGS: Brain: No acute territorial infarction, hemorrhage or intracranial mass. Atrophy and mild chronic small vessel ischemic changes of the white matter. Stable ventricle size. Vascular: No hyperdense vessels.  Carotid vascular calcification Skull: Normal. Negative for fracture or focal lesion. Sinuses/Orbits: No acute finding. Postsurgical changes of the globes. Other: None IMPRESSION: 1. No CT evidence for acute intracranial abnormality. 2. Atrophy and mild chronic small vessel ischemic changes of the white matter. Electronically Signed   By: Jasmine Pang M.D.   On: 11/18/2023 20:06        Scheduled Meds:  acetaminophen  1,000 mg Oral Q6H   amiodarone  100 mg Oral Daily   calcium carbonate  1,250 mg Oral Q breakfast   carvedilol  3.125 mg Oral BID WC   docusate sodium  100 mg Oral BID   feeding supplement  237 mL Oral BID BM   furosemide  20 mg Oral Daily   multivitamin-lutein  1 capsule Oral Daily   Continuous Infusions:   LOS: 1 day    Time spent: 40  minutes    Dorcas Carrow, MD [facility]

## 2023-11-20 NOTE — TOC Progression Note (Signed)
Transition of Care [facility]) - Progression Note    Patient Details  Name: Miranda Ford MRN: [id number] Date of Birth: 12/02/1924  [facility]) CM/SW Contact  Elliot Gault, LCSW Phone Number: 11/20/2023, 9:26 AM  Clinical Narrative:     TOC following. Spoke with Rae Halsted at [facility] to follow up on [facility] referral. Per Rae Halsted, they do not have an RN to come and assess patient today. They will send an RN for the assessment tomorrow AM. Per Rae Halsted, she will update pt's niece on the plan.  Updated MD and Palliative APNP.  TOC will follow.    Barriers to Discharge: Continued Medical Work up  Expected Discharge Plan and Services In-house Referral: Clinical Social Work   Post Acute Care Choice: Hospice Living arrangements for the past 2 months: Single Family Home                                       Social Determinants of Health (SDOH) Interventions SDOH Screenings   Food Insecurity: No Food Insecurity (11/19/2023)  Housing: Low Risk  (11/19/2023)  Transportation Needs: No Transportation Needs (11/19/2023)  Utilities: Not At Risk (11/19/2023)  Depression (PHQ2-9): Low Risk  (08/12/2019)  Tobacco Use: Medium Risk (11/19/2023)    Readmission Risk Interventions     No data to display

## 2023-11-20 NOTE — Progress Notes (Addendum)
Initial Nutrition Assessment  DOCUMENTATION CODES:   Severe malnutrition in context of chronic illness, Underweight  INTERVENTION:   Liberalize to regular diet since goal is comfort care. Ice cream and pudding TID with meals per patient request. Continue Ensure Plus High Protein po BID, each supplement provides 350 kcal and 20 grams of protein.  NUTRITION DIAGNOSIS:   Severe Malnutrition related to chronic illness (CHF) as evidenced by severe muscle depletion, severe fat depletion.  GOAL:   Other (Comment) (Provide meals per patient preferences)  MONITOR:   PO intake  REASON FOR ASSESSMENT:   Malnutrition Screening Tool    ASSESSMENT:   89 yo female admitted with acute T12 compression fracture, frequent falls. PMH includes osteoporosis, macular degeneration, CHF, tachycardia, demand ischemia.  Patient states that she is dying of cancer. She likes pudding and ice cream, RD to provide on all meal trays. Per notes, patient has had increasing confusion at home. Palliative Care team is following with plans for comfort care and transfer to residential hospice.   Patient meets criteria for severe malnutrition, given severe depletion of muscle and subcutaneous fat mass.  She is currently on a heart healthy diet. 100% meal completion documented for breakfast today.   Labs reviewed.  Medications reviewed and include oscal, colace, lasix, ocuvite.  NUTRITION - FOCUSED PHYSICAL EXAM:  Flowsheet Row Most Recent Value  Orbital Region Severe depletion  Upper Arm Region Moderate depletion  Thoracic and Lumbar Region Severe depletion  Buccal Region Severe depletion  Temple Region Severe depletion  Clavicle Bone Region Severe depletion  Clavicle and Acromion Bone Region Severe depletion  Scapular Bone Region Severe depletion  Dorsal Hand Severe depletion  Patellar Region Unable to assess  Anterior Thigh Region Unable to assess  Posterior Calf Region Severe depletion  Edema (RD  Assessment) None  Hair Reviewed  Eyes Reviewed  Mouth Reviewed  Skin Reviewed  Nails Reviewed       Diet Order:   Diet Order             Diet Heart Room service appropriate? Yes; Fluid consistency: Thin  Diet effective now                   EDUCATION NEEDS:   No education needs have been identified at this time  Skin:  Skin Assessment: Skin Integrity Issues: Skin Integrity Issues:: Unstageable Unstageable: L buttocks  Last BM:  unknown  Height:   Ht Readings from Last 1 Encounters:  11/18/23 5\' 3"  (1.6 m)    Weight:   Wt Readings from Last 1 Encounters:  11/18/23 45.4 kg    Ideal Body Weight:  52.3 kg  BMI:  Body mass index is 17.71 kg/m.  Estimated Nutritional Needs:   Kcal:  1350-1550  Protein:  70-80 gm  Fluid:  >/= 1.5 L   Gabriel Rainwater RD, LDN, CNSC Please refer to Amion for contact information.

## 2023-11-21 DIAGNOSIS — S22080G Wedge compression fracture of T11-T12 vertebra, subsequent encounter for fracture with delayed healing: Secondary | ICD-10-CM | POA: Diagnosis not present

## 2023-11-21 DIAGNOSIS — Z515 Encounter for palliative care: Secondary | ICD-10-CM | POA: Diagnosis not present

## 2023-11-21 DIAGNOSIS — F0390 Unspecified dementia without behavioral disturbance: Secondary | ICD-10-CM | POA: Diagnosis not present

## 2023-11-21 DIAGNOSIS — S22080A Wedge compression fracture of T11-T12 vertebra, initial encounter for closed fracture: Secondary | ICD-10-CM | POA: Diagnosis not present

## 2023-11-21 DIAGNOSIS — N1832 Chronic kidney disease, stage 3b: Secondary | ICD-10-CM | POA: Diagnosis not present

## 2023-11-21 DIAGNOSIS — Z7189 Other specified counseling: Secondary | ICD-10-CM | POA: Diagnosis not present

## 2023-11-21 DIAGNOSIS — R627 Adult failure to thrive: Secondary | ICD-10-CM | POA: Diagnosis not present

## 2023-11-21 MED ORDER — BISACODYL 10 MG RE SUPP
10.0000 mg | Freq: Once | RECTAL | Status: AC
  Administered 2023-11-21: 10 mg via RECTAL
  Filled 2023-11-21: qty 1

## 2023-11-21 NOTE — Plan of Care (Signed)
  Problem: Education: Goal: Knowledge of General Education information will improve Description: Including pain rating scale, medication(s)/side effects and non-pharmacologic comfort measures Outcome: Progressing   Problem: Clinical Measurements: Goal: Ability to maintain clinical measurements within normal limits will improve Outcome: Progressing Goal: Will remain free from infection Outcome: Progressing   

## 2023-11-21 NOTE — Progress Notes (Signed)
Palliative: Mrs. Biernacki is lying quietly in bed.  She appears acutely/chronically ill and very frail.  She is resting comfortably, but wakes easily as I touch her arm.  She is calm and cooperative, comfortable.  She is surrounded by loving family.  Her niece/HCPOA, Oralia Rud, shares that she has been accepted to [facility] for comfort and dignity at end-of-life.  Reassurance is provided, no questions at this time.  Conference with attending, bedside nursing staff, transition of care team related to patient condition, needs, goals of care, disposition.  Plan: Requesting comfort and dignity at end-of-life, residential hospice at [facility].  End-of-life order set in place. DNR/goldenrod form is on chart.  35 minutes Lillia Carmel, NP Palliative medicine team Team phone [phone]

## 2023-11-21 NOTE — Progress Notes (Signed)
Patient is leaving via EMS transport. No distress noted at time of discharge . Private sitter made family aware of discharge.

## 2023-11-21 NOTE — Hospital Course (Signed)
89 with a history of HFrEF, dementia, paroxysmal atrial tachycardia, PE on apixaban, aortic stenosis, failure to thrive presenting with confusion, increased agitation, and frequent falls.  At baseline, the patient lives alone, but she has a caregiver present 16-18 hours/day.  Niece Miranda Ford shares that Miranda Ford has had a marked decline over the past few months.  The patient has also expressed a Proctor decline.  The patient has had decreased oral intake and has been losing weight consistently.  In the ED, the patient was afebrile with soft blood pressures.  WBC 7.2, hemoglobin 15.2, platelets 263.  BMP shows sodium 133, potassium 5.1, serum creatinine 1.32.  The patient was initially given IV fluids.  Palliative medicine was consulted to assist with goals of care discussions. GOC was discussed with the patient's niece.  Patient's niece requested referral to residential hospice. Given the hospitalization, the patient's oral intake remains poor.  The patient remains fixated on having cancer and that she is dying.  Palliative medicine continued to assist with symptom management and transition to residential hospice.

## 2023-11-21 NOTE — TOC Progression Note (Signed)
Transition of Care [facility]) - Progression Note    Patient Details  Name: Miranda Ford MRN: [id number] Date of Birth: 22-Jan-1924  Transition of Care [facility]) CM/SW Contact  Erin Sons, [place] Phone Number: 11/21/2023, 2:50 PM  Clinical Narrative:     CSW is informed by PMT that pt was accepted at [facility]. CSW confirmed with Rae Halsted that they have a bed and are ready to admit pt. RN to call report to [phone]. Pt is on the list for EMS transport.    Barriers to Discharge: Continued Medical Work up  Expected Discharge Plan and Services In-house Referral: Clinical Social Work   Post Acute Care Choice: Hospice Living arrangements for the past 2 months: Single Family Home Expected Discharge Date: 11/21/23                                     Social Determinants of Health (SDOH) Interventions SDOH Screenings   Food Insecurity: No Food Insecurity (11/19/2023)  Housing: Low Risk  (11/19/2023)  Transportation Needs: No Transportation Needs (11/19/2023)  Utilities: Not At Risk (11/19/2023)  Depression (PHQ2-9): Low Risk  (08/12/2019)  Tobacco Use: Medium Risk (11/19/2023)    Readmission Risk Interventions     No data to display

## 2023-11-21 NOTE — Progress Notes (Signed)
Report called to Marylene Land at [facility] patient given PRN oxycodone for pain prevention during transport. [facility]ms has been called and family at bedside and updated on transportation and discharge .

## 2023-11-21 NOTE — Progress Notes (Addendum)
PROGRESS NOTE  Miranda Ford NFA:[id number] DOB: Sep 24, 1924 DOA: 11/18/2023 PCP: Assunta Found, MD  Brief History:  89 with a history of HFrEF, dementia, paroxysmal atrial tachycardia, PE on apixaban, aortic stenosis, failure to thrive presenting with confusion, increased agitation, and frequent falls.  At baseline, the patient lives alone, but she has a caregiver present 16-18 hours/day.  Niece [name] shares that Miranda Ford has had a marked decline over the past few months.  The patient has also expressed a Proctor decline.  The patient has had decreased oral intake and has been losing weight consistently.  In the ED, the patient was afebrile with soft blood pressures.  WBC 7.2, hemoglobin 15.2, platelets 263.  BMP shows sodium 133, potassium 5.1, serum creatinine 1.32.  The patient was initially given IV fluids.  Palliative medicine was consulted to assist with goals of care discussions. GOC was discussed with the patient's niece.  Patient's niece requested referral to residential hospice. Given the hospitalization, the patient's oral intake remains poor.  The patient remains fixated on having cancer and that she is dying.  Palliative medicine continued to assist with symptom management and transition to residential hospice.   Assessment/Plan:  Acute T12 compression fracture -11/18/2023 CT L-spine--acute T12 compression fracture without retropulsion.  Severe L3 compression fracture with progressive height loss and 5 mm of retropulsion.  Subacute L2 fracture. -11/18/2023 CT AP--no acute findings, moderate stool, small volume free fluid. -Continue oxycodone -Continue acetaminophen around-the-clock  Failure to thrive -Patient continues to have poor oral intake -UA neg for pyuria  Dementia without behavioral disturbance -Patient likely has progression of underlying dementia -Appreciate palliative medicine consultation and follow-up  Paroxysmal atrial tachycardia -Rate  controlled -Continue amiodarone for now -Continue carvedilol for now  History of pulmonary embolus -Holding apixaban in the setting of frequent falls and nosebleeds -Diagnosed in 2017  Chronic HFrEF -Clinically compensated -Continue carvedilol for now -Holding furosemide in the setting of poor oral intake -09/19/2016 echo EF 25%, grade 1 DD, mild to moderate AAS  CKD stage IIIb -Baseline creatinine 1.1-1.3  Severe malnutrition -continue supplements          Family Communication:   caretaker at bedside 11/27  Consultants:  palliative  Code Status:  FULL COMFORT  DVT Prophylaxis:  FULL COMFORT   Procedures: As Listed in Progress Note Above  Antibiotics: None        Subjective: Patient remains fixated on having cancer and dying.  She denies any headache, chest pain, shortness breath, abdominal pain.  She denies any fevers or chills.  Objective: Vitals:   11/20/23 1015 11/20/23 1336 11/20/23 1942 11/21/23 0410  BP:  (!) 89/52 (!) 110/53 110/60  Pulse:  64 (!) 58 (!) 56  Resp:  17 19 20   Temp:  97.8 F (36.6 C) 97.7 F (36.5 C) 97.8 F (36.6 C)  TempSrc:  Oral Oral Oral  SpO2: 95% 95% 93% 93%  Weight:      Height:        Intake/Output Summary (Last 24 hours) at 11/21/2023 0802 Last data filed at 11/21/2023 0300 Gross per 24 hour  Intake 735 ml  Output 400 ml  Net 335 ml   Weight change:  Exam:  General:  Pt is alert, follows commands appropriately, not in acute distress HEENT: No icterus, No thrush, No neck mass, [place]/AT Cardiovascular: RRR, S1/S2, no rubs, no gallops Respiratory: CTA bilaterally, no wheezing, no crackles, no rhonchi Abdomen: Soft/+BS, non tender,  non distended, no guarding Extremities: No edema, No lymphangitis, No petechiae, No rashes, no synovitis   Data Reviewed: I have personally reviewed following labs and imaging studies Basic Metabolic Panel: Recent Labs  Lab 11/18/23 1742 11/19/23 0520  NA 133* 134*  K 5.1  4.1  CL 94* 96*  CO2 29 29  GLUCOSE 107* 81  BUN 35* 32*  CREATININE 1.32* 1.12*  CALCIUM 8.8* 8.5*   Liver Function Tests: Recent Labs  Lab 11/18/23 1742  AST 32  ALT 19  ALKPHOS 91  BILITOT 1.7*  PROT 7.0  ALBUMIN 3.4*   No results for input(s): "LIPASE", "AMYLASE" in the last 168 hours. No results for input(s): "AMMONIA" in the last 168 hours. Coagulation Profile: No results for input(s): "INR", "PROTIME" in the last 168 hours. CBC: Recent Labs  Lab 11/18/23 1742 11/19/23 0520  WBC 7.2 7.0  NEUTROABS 5.8  --   HGB 15.2* 13.6  HCT 46.5* 41.4  MCV 96.7 96.5  PLT 263 261   Cardiac Enzymes: No results for input(s): "CKTOTAL", "CKMB", "CKMBINDEX", "TROPONINI" in the last 168 hours. BNP: Invalid input(s): "POCBNP" CBG: No results for input(s): "GLUCAP" in the last 168 hours. HbA1C: No results for input(s): "HGBA1C" in the last 72 hours. Urine analysis:    Component Value Date/Time   COLORURINE YELLOW 11/18/2023 2114   APPEARANCEUR CLEAR 11/18/2023 2114   LABSPEC 1.040 (H) 11/18/2023 2114   PHURINE 5.0 11/18/2023 2114   GLUCOSEU NEGATIVE 11/18/2023 2114   HGBUR NEGATIVE 11/18/2023 2114   BILIRUBINUR NEGATIVE 11/18/2023 2114   KETONESUR 5 (A) 11/18/2023 2114   PROTEINUR NEGATIVE 11/18/2023 2114   NITRITE NEGATIVE 11/18/2023 2114   LEUKOCYTESUR NEGATIVE 11/18/2023 2114   Sepsis Labs: @LABRCNTIP (procalcitonin:4,lacticidven:4) )No results found for this or any previous visit (from the past 240 hour(s)).   Scheduled Meds:  acetaminophen  1,000 mg Oral Q6H   amiodarone  100 mg Oral Daily   calcium carbonate  1,250 mg Oral Q breakfast   carvedilol  3.125 mg Oral BID WC   docusate sodium  100 mg Oral BID   feeding supplement  237 mL Oral BID BM   furosemide  20 mg Oral Daily   multivitamin-lutein  1 capsule Oral Daily   Continuous Infusions:  Procedures/Studies: DG Chest Portable 1 View  Result Date: 11/18/2023 CLINICAL DATA:  Dyspnea EXAM: PORTABLE  CHEST 1 VIEW COMPARISON:  09/15/2023, 11/04/2021 FINDINGS: Emphysema and bronchitic changes. Mild diffuse increased interstitial opacity, difficult to exclude mild superimposed interstitial process such as edema. Cardiomegaly with aortic atherosclerosis. IMPRESSION: Emphysema and bronchitic changes. Mild diffuse increased interstitial opacity, difficult to exclude mild superimposed interstitial process such as edema. Electronically Signed   By: Jasmine Pang M.D.   On: 11/18/2023 21:46   CT L-SPINE NO CHARGE  Result Date: 11/18/2023 CLINICAL DATA:  Left lower quadrant pain dementia EXAM: CT Lumbar Spine without contrast TECHNIQUE: Technique: Multiplanar CT images of the lumbar spine were reconstructed from contemporary CT of the Abdomen and Pelvis. RADIATION DOSE REDUCTION: This exam was performed according to the departmental dose-optimization program which includes automated exposure control, adjustment of the mA and/or kV according to patient size and/or use of iterative reconstruction technique. CONTRAST:  None or No additional COMPARISON:  07/16/2023 FINDINGS: Segmentation: 5 lumbar type vertebrae. Alignment: Scoliosis. 7 mm anterolisthesis L4 on L5. Trace retrolisthesis L1 on L2. Vertebrae: Moderate chronic compression fracture at L1, stable 6 mm retropulsion of the inferior vertebral body. Severe compression fracture L3, with progression of loss of  vertebral body height for since July, now estimated at 75%. 8 mm retropulsion of the upper vertebral body, mass effect on the right anterior thecal sac and narrowing of the right lateral recess at L2-L3. Moderate chronic superior endplate deformity at L4. Acute appearing superior endplate compression fracture at T12. No significant retropulsion. Interval mild diffuse loss of vertebral height at L2 with heterogeneous areas of sclerosis, compatible with subacute fracture. Paraspinal and other soft tissues: See separately dictated abdominopelvic CT. Aneurysmal  dilatation of the ascending aorta up to 4.1 cm. Mild paravertebral edema at T12. Disc levels: Disc space narrowing and vacuum disc at T12-L1. Mild disc space narrowing and vacuum disc at L4-L5. At least mild canal stenosis at L3-L4 and moderate canal stenosis at L4-L5. Advanced hypertrophic facet degenerative changes at multiple levels, worst at L4-L5. IMPRESSION: 1. Acute appearing superior endplate compression fracture at T12. No significant retropulsion. 2. Mild diffuse loss of height L2 vertebral body compared to prior lumbar CT with interval heterogeneous areas of sclerosis, suspected to represent subacute fracture. 3. Severe compression fracture at L3, with progression of loss of vertebral body height since July, now estimated at 75%. 8 mm retropulsion of the upper vertebral body, results in mass effect on the right anterior thecal sac and narrowing of the right lateral recess at L2-L3. 4. Chronic compression fractures at L1 and L4. 5. Scoliosis and degenerative changes. 6. Aneurysmal dilatation of the ascending aorta up to 4.1 cm. Recommend annual imaging followup by CTA or MRA. This recommendation follows 2010 ACCF/AHA/AATS/ACR/ASA/SCA/SCAI/SIR/STS/SVM Guidelines for the Diagnosis and Management of Patients with Thoracic Aortic Disease. Circulation. 2010; 121: W098-J191. Aortic aneurysm NOS (ICD10-I71.9) Electronically Signed   By: Jasmine Pang M.D.   On: 11/18/2023 20:23   CT ABDOMEN PELVIS W CONTRAST  Result Date: 11/18/2023 CLINICAL DATA:  Left lower quadrant abdominal pain and worsening mental status. History of dementia. EXAM: CT ABDOMEN AND PELVIS WITH CONTRAST TECHNIQUE: Multidetector CT imaging of the abdomen and pelvis was performed using the standard protocol following bolus administration of intravenous contrast. RADIATION DOSE REDUCTION: This exam was performed according to the departmental dose-optimization program which includes automated exposure control, adjustment of the mA and/or kV  according to patient size and/or use of iterative reconstruction technique. CONTRAST:  75mL OMNIPAQUE IOHEXOL 300 MG/ML  SOLN COMPARISON:  CT pelvis 07/16/2023 and CT abdomen and pelvis 06/28/2020 FINDINGS: Lower chest: Cardiomegaly. Bibasilar atelectasis/scarring. No acute abnormality. Hepatobiliary: Cholecystectomy.  No acute abnormality. Pancreas: Unremarkable. Spleen: Unremarkable. Adrenals/Urinary Tract: Bilateral cortical renal atrophy. Dilated right extrarenal pelvis similar to prior. No urinary calculi. Unremarkable bladder. Stomach/Bowel: Normal caliber large and small bowel. Moderate stool in the colon. Stomach is within normal limits. Vascular/Lymphatic: Aortic atherosclerosis. No enlarged abdominal or pelvic lymph nodes. Reproductive: No acute abnormality. Other: Small volume free fluid in the pelvis. No free intraperitoneal air. Musculoskeletal: Demineralization. IM rod and screw fixation left femur. No acute fracture. See separate report for findings in the lumbar spine. IMPRESSION: 1. No acute abnormality in the abdomen or pelvis. See separate report for findings in the lumbar spine. 2. Small volume free fluid in the pelvis, nonspecific. 3. Moderate stool in the colon. Aortic Atherosclerosis (ICD10-I70.0). Electronically Signed   By: Minerva Fester M.D.   On: 11/18/2023 20:12   CT Head Wo Contrast  Result Date: 11/18/2023 CLINICAL DATA:  Dementia worsening mental status EXAM: CT HEAD WITHOUT CONTRAST TECHNIQUE: Contiguous axial images were obtained from the base of the skull through the vertex without intravenous contrast. RADIATION DOSE REDUCTION:  This exam was performed according to the departmental dose-optimization program which includes automated exposure control, adjustment of the mA and/or kV according to patient size and/or use of iterative reconstruction technique. COMPARISON:  CT brain 09/15/2023 FINDINGS: Brain: No acute territorial infarction, hemorrhage or intracranial mass. Atrophy  and mild chronic small vessel ischemic changes of the white matter. Stable ventricle size. Vascular: No hyperdense vessels.  Carotid vascular calcification Skull: Normal. Negative for fracture or focal lesion. Sinuses/Orbits: No acute finding. Postsurgical changes of the globes. Other: None IMPRESSION: 1. No CT evidence for acute intracranial abnormality. 2. Atrophy and mild chronic small vessel ischemic changes of the white matter. Electronically Signed   By: Jasmine Pang M.D.   On: 11/18/2023 20:06    Catarina Hartshorn, DO  [facility]  If 7PM-7AM, please contact night-coverage [email or web address] Password [email or web address] 11/21/2023, 8:02 AM   LOS: 2 days

## 2023-11-21 NOTE — Plan of Care (Signed)
  Problem: Education: Goal: Knowledge of General Education information will improve Description Including pain rating scale, medication(s)/side effects and non-pharmacologic comfort measures Outcome: Progressing   Problem: Health Behavior/Discharge Planning: Goal: Ability to manage health-related needs will improve Outcome: Progressing   

## 2023-11-21 NOTE — Care Management Important Message (Signed)
Important Message  Patient Details  Name: Miranda Ford MRN: [id number] Date of Birth: February 24, 1924   Important Message Given:  N/A - LOS <3 / Initial given by admissions     Corey Harold 11/21/2023, 11:15 AM

## 2023-11-21 NOTE — Discharge Summary (Signed)
Physician Discharge Summary   Patient: Miranda Ford MRN: [id number] DOB: Mar 01, 1924  Admit date:     11/18/2023  Discharge date: 11/21/23  Discharge Physician: Onalee Hua Lamere Lightner   PCP: Assunta Found, MD   Discharge to [facility] Course: 31 with a history of HFrEF, dementia, paroxysmal atrial tachycardia, PE on apixaban, aortic stenosis, failure to thrive presenting with confusion, increased agitation, and frequent falls.  At baseline, the patient lives alone, but she has a caregiver present 16-18 hours/day.  Niece [name] shares that Miranda Ford has had a marked decline over the past few months.  The patient has also expressed a [place] decline.  The patient has had decreased oral intake and has been losing weight consistently.  In the ED, the patient was afebrile with soft blood pressures.  WBC 7.2, hemoglobin 15.2, platelets 263.  BMP shows sodium 133, potassium 5.1, serum creatinine 1.32.  The patient was initially given IV fluids.  Palliative medicine was consulted to assist with goals of care discussions. GOC was discussed with the patient's niece.  Patient's niece requested referral to residential hospice. Given the hospitalization, the patient's oral intake remains poor.  The patient remains fixated on having cancer and that she is dying.  Palliative medicine continued to assist with symptom management and transition to residential hospice.  Assessment and Plan: Acute T12 compression fracture -11/18/2023 CT L-spine--acute T12 compression fracture without retropulsion.  Severe L3 compression fracture with progressive height loss and 5 mm of retropulsion.  Subacute L2 fracture. -11/18/2023 CT AP--no acute findings, moderate stool, small volume free fluid. -Continued oxycodone during hospitalization -Continue acetaminophen around-the-clock -now transitioning to residential hospice   Failure to thrive -Patient continues to have poor oral intake -UA neg for pyuria   Dementia without  behavioral disturbance -Patient likely has progression of underlying dementia -Appreciate palliative medicine consultation and follow-up   Paroxysmal atrial tachycardia -Rate controlled -Continue amiodarone for now -Continue carvedilol for now   History of pulmonary embolus -Holding apixaban in the setting of frequent falls and nosebleeds -Diagnosed in 2017   Chronic HFrEF -Clinically compensated -Continue carvedilol for now -Holding furosemide in the setting of poor oral intake -09/19/2016 echo EF 25%, grade 1 DD, mild to moderate AAS   CKD stage IIIb -Baseline creatinine 1.1-1.3   Severe malnutrition -continue supplements  GOC -palliative medicine following -family agrees to transition to [employer] to continue end of life care with focus on full comfort measures        Consultants: palliative Procedures performed: none  Disposition: Hospice care Diet recommendation:  Regular diet DISCHARGE MEDICATION: Allergies as of 11/21/2023       Reactions   Codeine Nausea Only   Penicillins Other (See Comments)   "Not allergic just a lot of my family members are allergic".        Medication List     STOP taking these medications    amiodarone 200 MG tablet Commonly known as: PACERONE   atorvastatin 40 MG tablet Commonly known as: LIPITOR   calcium carbonate 1500 (600 Ca) MG Tabs tablet Commonly known as: OSCAL   carvedilol 3.125 MG tablet Commonly known as: COREG   Eliquis 2.5 MG Tabs tablet Generic drug: apixaban   ICAPS PO   mupirocin ointment 2 % Commonly known as: BACTROBAN   spironolactone 25 MG tablet Commonly known as: ALDACTONE        Discharge Exam: Filed Weights   11/18/23 1626  Weight: 45.4 kg   HEENT:  [place]/AT, No  thrush, no icterus CV:  RRR, no rub, no S3, no S4 Lung:  CTA, no wheeze, no rhonchi Abd:  soft/+BS, NT Ext:  No edema, no lymphangitis, no synovitis, no rash   Condition at discharge: stable  The results of  significant diagnostics from this hospitalization (including imaging, microbiology, ancillary and laboratory) are listed below for reference.   Imaging Studies: DG Chest Portable 1 View  Result Date: 11/18/2023 CLINICAL DATA:  Dyspnea EXAM: PORTABLE CHEST 1 VIEW COMPARISON:  09/15/2023, 11/04/2021 FINDINGS: Emphysema and bronchitic changes. Mild diffuse increased interstitial opacity, difficult to exclude mild superimposed interstitial process such as edema. Cardiomegaly with aortic atherosclerosis. IMPRESSION: Emphysema and bronchitic changes. Mild diffuse increased interstitial opacity, difficult to exclude mild superimposed interstitial process such as edema. Electronically Signed   By: Jasmine Pang M.D.   On: 11/18/2023 21:46   CT L-SPINE NO CHARGE  Result Date: 11/18/2023 CLINICAL DATA:  Left lower quadrant pain dementia EXAM: CT Lumbar Spine without contrast TECHNIQUE: Technique: Multiplanar CT images of the lumbar spine were reconstructed from contemporary CT of the Abdomen and Pelvis. RADIATION DOSE REDUCTION: This exam was performed according to the departmental dose-optimization program which includes automated exposure control, adjustment of the mA and/or kV according to patient size and/or use of iterative reconstruction technique. CONTRAST:  None or No additional COMPARISON:  07/16/2023 FINDINGS: Segmentation: 5 lumbar type vertebrae. Alignment: Scoliosis. 7 mm anterolisthesis L4 on L5. Trace retrolisthesis L1 on L2. Vertebrae: Moderate chronic compression fracture at L1, stable 6 mm retropulsion of the inferior vertebral body. Severe compression fracture L3, with progression of loss of vertebral body height for since July, now estimated at 75%. 8 mm retropulsion of the upper vertebral body, mass effect on the right anterior thecal sac and narrowing of the right lateral recess at L2-L3. Moderate chronic superior endplate deformity at L4. Acute appearing superior endplate compression fracture  at T12. No significant retropulsion. Interval mild diffuse loss of vertebral height at L2 with heterogeneous areas of sclerosis, compatible with subacute fracture. Paraspinal and other soft tissues: See separately dictated abdominopelvic CT. Aneurysmal dilatation of the ascending aorta up to 4.1 cm. Mild paravertebral edema at T12. Disc levels: Disc space narrowing and vacuum disc at T12-L1. Mild disc space narrowing and vacuum disc at L4-L5. At least mild canal stenosis at L3-L4 and moderate canal stenosis at L4-L5. Advanced hypertrophic facet degenerative changes at multiple levels, worst at L4-L5. IMPRESSION: 1. Acute appearing superior endplate compression fracture at T12. No significant retropulsion. 2. Mild diffuse loss of height L2 vertebral body compared to prior lumbar CT with interval heterogeneous areas of sclerosis, suspected to represent subacute fracture. 3. Severe compression fracture at L3, with progression of loss of vertebral body height since July, now estimated at 75%. 8 mm retropulsion of the upper vertebral body, results in mass effect on the right anterior thecal sac and narrowing of the right lateral recess at L2-L3. 4. Chronic compression fractures at L1 and L4. 5. Scoliosis and degenerative changes. 6. Aneurysmal dilatation of the ascending aorta up to 4.1 cm. Recommend annual imaging followup by CTA or MRA. This recommendation follows 2010 ACCF/AHA/AATS/ACR/ASA/SCA/SCAI/SIR/STS/SVM Guidelines for the Diagnosis and Management of Patients with Thoracic Aortic Disease. Circulation. 2010; 121: Z610-R604. Aortic aneurysm NOS (ICD10-I71.9) Electronically Signed   By: Jasmine Pang M.D.   On: 11/18/2023 20:23   CT ABDOMEN PELVIS W CONTRAST  Result Date: 11/18/2023 CLINICAL DATA:  Left lower quadrant abdominal pain and worsening mental status. History of dementia. EXAM: CT ABDOMEN AND  PELVIS WITH CONTRAST TECHNIQUE: Multidetector CT imaging of the abdomen and pelvis was performed using the  standard protocol following bolus administration of intravenous contrast. RADIATION DOSE REDUCTION: This exam was performed according to the departmental dose-optimization program which includes automated exposure control, adjustment of the mA and/or kV according to patient size and/or use of iterative reconstruction technique. CONTRAST:  75mL OMNIPAQUE IOHEXOL 300 MG/ML  SOLN COMPARISON:  CT pelvis 07/16/2023 and CT abdomen and pelvis 06/28/2020 FINDINGS: Lower chest: Cardiomegaly. Bibasilar atelectasis/scarring. No acute abnormality. Hepatobiliary: Cholecystectomy.  No acute abnormality. Pancreas: Unremarkable. Spleen: Unremarkable. Adrenals/Urinary Tract: Bilateral cortical renal atrophy. Dilated right extrarenal pelvis similar to prior. No urinary calculi. Unremarkable bladder. Stomach/Bowel: Normal caliber large and small bowel. Moderate stool in the colon. Stomach is within normal limits. Vascular/Lymphatic: Aortic atherosclerosis. No enlarged abdominal or pelvic lymph nodes. Reproductive: No acute abnormality. Other: Small volume free fluid in the pelvis. No free intraperitoneal air. Musculoskeletal: Demineralization. IM rod and screw fixation left femur. No acute fracture. See separate report for findings in the lumbar spine. IMPRESSION: 1. No acute abnormality in the abdomen or pelvis. See separate report for findings in the lumbar spine. 2. Small volume free fluid in the pelvis, nonspecific. 3. Moderate stool in the colon. Aortic Atherosclerosis (ICD10-I70.0). Electronically Signed   By: Minerva Fester M.D.   On: 11/18/2023 20:12   CT Head Wo Contrast  Result Date: 11/18/2023 CLINICAL DATA:  Dementia worsening mental status EXAM: CT HEAD WITHOUT CONTRAST TECHNIQUE: Contiguous axial images were obtained from the base of the skull through the vertex without intravenous contrast. RADIATION DOSE REDUCTION: This exam was performed according to the departmental dose-optimization program which includes  automated exposure control, adjustment of the mA and/or kV according to patient size and/or use of iterative reconstruction technique. COMPARISON:  CT brain 09/15/2023 FINDINGS: Brain: No acute territorial infarction, hemorrhage or intracranial mass. Atrophy and mild chronic small vessel ischemic changes of the white matter. Stable ventricle size. Vascular: No hyperdense vessels.  Carotid vascular calcification Skull: Normal. Negative for fracture or focal lesion. Sinuses/Orbits: No acute finding. Postsurgical changes of the globes. Other: None IMPRESSION: 1. No CT evidence for acute intracranial abnormality. 2. Atrophy and mild chronic small vessel ischemic changes of the white matter. Electronically Signed   By: Jasmine Pang M.D.   On: 11/18/2023 20:06    Microbiology: Results for orders placed or performed during the hospital encounter of 07/10/19  SARS Coronavirus 2 (CEPHEID - Performed in [facility] hospital lab), Hosp Order     Status: None   Collection Time: 07/10/19 10:11 PM   Specimen: Nasopharyngeal Swab  Result Value Ref Range Status   SARS Coronavirus 2 NEGATIVE NEGATIVE Final    Comment: (NOTE) If result is NEGATIVE SARS-CoV-2 target nucleic acids are NOT DETECTED. The SARS-CoV-2 RNA is generally detectable in upper and lower  respiratory specimens during the acute phase of infection. The lowest  concentration of SARS-CoV-2 viral copies this assay can detect is 250  copies / mL. A negative result does not preclude SARS-CoV-2 infection  and should not be used as the sole basis for treatment or other  patient management decisions.  A negative result may occur with  improper specimen collection / handling, submission of specimen other  than nasopharyngeal swab, presence of viral mutation(s) within the  areas targeted by this assay, and inadequate number of viral copies  (<250 copies / mL). A negative result must be combined with clinical  observations, patient history, and  epidemiological information.  If result is POSITIVE SARS-CoV-2 target nucleic acids are DETECTED. The SARS-CoV-2 RNA is generally detectable in upper and lower  respiratory specimens dur ing the acute phase of infection.  Positive  results are indicative of active infection with SARS-CoV-2.  Clinical  correlation with patient history and other diagnostic information is  necessary to determine patient infection status.  Positive results do  not rule out bacterial infection or co-infection with other viruses. If result is PRESUMPTIVE POSTIVE SARS-CoV-2 nucleic acids MAY BE PRESENT.   A presumptive positive result was obtained on the submitted specimen  and confirmed on repeat testing.  While 2019 novel coronavirus  (SARS-CoV-2) nucleic acids may be present in the submitted sample  additional confirmatory testing may be necessary for epidemiological  and / or clinical management purposes  to differentiate between  SARS-CoV-2 and other Sarbecovirus currently known to infect humans.  If clinically indicated additional testing with an alternate test  methodology [phone]) is advised. The SARS-CoV-2 RNA is generally  detectable in upper and lower respiratory sp ecimens during the acute  phase of infection. The expected result is Negative. Fact Sheet for Patients:  BoilerBrush.com.cy Fact Sheet for Healthcare Providers: https://pope.com/ This test is not yet approved or cleared by the [place] FDA and has been authorized for detection and/or diagnosis of SARS-CoV-2 by FDA under an Emergency Use Authorization (EUA).  This EUA will remain in effect (meaning this test can be used) for the duration of the COVID-19 declaration under Section 564(b)(1) of the Act, 21 U.S.C. section 360bbb-3(b)(1), unless the authorization is terminated or revoked sooner. Performed at [facility], [address], [place], [place] [address]   Surgical PCR screen      Status: None   Collection Time: 07/11/19  5:45 AM   Specimen: Nasal Mucosa; Nasal Swab  Result Value Ref Range Status   MRSA, PCR NEGATIVE NEGATIVE Final   Staphylococcus aureus NEGATIVE NEGATIVE Final    Comment: (NOTE) The Xpert SA Assay (FDA approved for NASAL specimens in patients 46 years of age and older), is one component of a comprehensive surveillance program. It is not intended to diagnose infection nor to guide or monitor treatment. Performed at [facility], [address], [place], [place] [address]   SARS Coronavirus 2 (CEPHEID - Performed in [facility] hospital lab), Hosp Order     Status: None   Collection Time: 07/14/19 12:33 PM   Specimen: Nasopharyngeal Swab  Result Value Ref Range Status   SARS Coronavirus 2 NEGATIVE NEGATIVE Final    Comment: (NOTE) If result is NEGATIVE SARS-CoV-2 target nucleic acids are NOT DETECTED. The SARS-CoV-2 RNA is generally detectable in upper and lower  respiratory specimens during the acute phase of infection. The lowest  concentration of SARS-CoV-2 viral copies this assay can detect is 250  copies / mL. A negative result does not preclude SARS-CoV-2 infection  and should not be used as the sole basis for treatment or other  patient management decisions.  A negative result may occur with  improper specimen collection / handling, submission of specimen other  than nasopharyngeal swab, presence of viral mutation(s) within the  areas targeted by this assay, and inadequate number of viral copies  (<250 copies / mL). A negative result must be combined with clinical  observations, patient history, and epidemiological information. If result is POSITIVE SARS-CoV-2 target nucleic acids are DETECTED. The SARS-CoV-2 RNA is generally detectable in upper and lower  respiratory specimens dur ing the acute phase of  infection.  Positive  results are indicative of active infection with SARS-CoV-2.  Clinical  correlation with patient  history and other diagnostic information is  necessary to determine patient infection status.  Positive results do  not rule out bacterial infection or co-infection with other viruses. If result is PRESUMPTIVE POSTIVE SARS-CoV-2 nucleic acids MAY BE PRESENT.   A presumptive positive result was obtained on the submitted specimen  and confirmed on repeat testing.  While 2019 novel coronavirus  (SARS-CoV-2) nucleic acids may be present in the submitted sample  additional confirmatory testing may be necessary for epidemiological  and / or clinical management purposes  to differentiate between  SARS-CoV-2 and other Sarbecovirus currently known to infect humans.  If clinically indicated additional testing with an alternate test  methodology [phone]) is advised. The SARS-CoV-2 RNA is generally  detectable in upper and lower respiratory sp ecimens during the acute  phase of infection. The expected result is Negative. Fact Sheet for Patients:  BoilerBrush.com.cy Fact Sheet for Healthcare Providers: https://pope.com/ This test is not yet approved or cleared by the [place] FDA and has been authorized for detection and/or diagnosis of SARS-CoV-2 by FDA under an Emergency Use Authorization (EUA).  This EUA will remain in effect (meaning this test can be used) for the duration of the COVID-19 declaration under Section 564(b)(1) of the Act, 21 U.S.C. section 360bbb-3(b)(1), unless the authorization is terminated or revoked sooner. Performed at [facility], [address] [place], [place] [address]     Labs: CBC: Recent Labs  Lab 11/18/23 1742 11/19/23 0520  WBC 7.2 7.0  NEUTROABS 5.8  --   HGB 15.2* 13.6  HCT 46.5* 41.4  MCV 96.7 96.5  PLT 263 261   Basic Metabolic Panel: Recent Labs  Lab 11/18/23 1742 11/19/23 0520  NA 133* 134*  K 5.1 4.1  CL 94* 96*  CO2 29 29  GLUCOSE 107* 81  BUN 35* 32*  CREATININE 1.32* 1.12*   CALCIUM 8.8* 8.5*   Liver Function Tests: Recent Labs  Lab 11/18/23 1742  AST 32  ALT 19  ALKPHOS 91  BILITOT 1.7*  PROT 7.0  ALBUMIN 3.4*   CBG: No results for input(s): "GLUCAP" in the last 168 hours.  Discharge time spent: greater than 30 minutes.  Signed: Catarina Hartshorn, MD [facility] 11/21/2023

## 2023-11-21 NOTE — Progress Notes (Signed)
Patient unable to swallow big pills she did swallow amiodarone and tylenol and then family request to stop as she is having some trouble swallowing at this time,

## 2023-12-26 DEATH — deceased

## 2024-05-30 ENCOUNTER — Ambulatory Visit: Payer: PPO | Admitting: Cardiology
